# Patient Record
Sex: Male | Born: 1945 | Race: White | Hispanic: No | Marital: Married | Smoking: Former smoker
Health system: Southern US, Community
[De-identification: ages and names within clinical notes are randomized; demographics above are authoritative.]

## PROBLEM LIST (undated history)

## (undated) DIAGNOSIS — I519 Heart disease, unspecified: Secondary | ICD-10-CM

## (undated) DIAGNOSIS — I499 Cardiac arrhythmia, unspecified: Secondary | ICD-10-CM

## (undated) DIAGNOSIS — I420 Dilated cardiomyopathy: Secondary | ICD-10-CM

## (undated) DIAGNOSIS — Z7901 Long term (current) use of anticoagulants: Secondary | ICD-10-CM

## (undated) DIAGNOSIS — I359 Nonrheumatic aortic valve disorder, unspecified: Secondary | ICD-10-CM

## (undated) DIAGNOSIS — E785 Hyperlipidemia, unspecified: Secondary | ICD-10-CM

## (undated) HISTORY — DX: Nonrheumatic aortic valve disorder, unspecified: I35.9

## (undated) HISTORY — DX: Dilated cardiomyopathy: I42.0

## (undated) HISTORY — PX: AORTIC VALVE REPLACEMENT: SHX41

## (undated) HISTORY — DX: Long term (current) use of anticoagulants: Z79.01

## (undated) HISTORY — DX: Heart disease, unspecified: I51.9

## (undated) HISTORY — DX: Hyperlipidemia, unspecified: E78.5

---

## 2002-04-07 HISTORY — PX: DOPPLER ECHOCARDIOGRAPHY: SHX263

## 2002-05-07 ENCOUNTER — Encounter: Payer: Self-pay | Admitting: Emergency Medicine

## 2002-05-07 ENCOUNTER — Inpatient Hospital Stay (HOSPITAL_COMMUNITY): Admission: EM | Admit: 2002-05-07 | Discharge: 2002-05-09 | Payer: Self-pay | Admitting: Emergency Medicine

## 2002-05-08 ENCOUNTER — Encounter: Payer: Self-pay | Admitting: General Surgery

## 2002-05-09 ENCOUNTER — Encounter: Payer: Self-pay | Admitting: Neurosurgery

## 2004-11-01 ENCOUNTER — Ambulatory Visit: Payer: Self-pay | Admitting: Internal Medicine

## 2004-11-07 ENCOUNTER — Ambulatory Visit: Payer: Self-pay | Admitting: Family Medicine

## 2004-11-24 ENCOUNTER — Ambulatory Visit: Payer: Self-pay | Admitting: Internal Medicine

## 2007-06-24 ENCOUNTER — Encounter: Payer: Self-pay | Admitting: Internal Medicine

## 2007-12-23 ENCOUNTER — Encounter: Payer: Self-pay | Admitting: Internal Medicine

## 2008-06-22 ENCOUNTER — Encounter: Payer: Self-pay | Admitting: Internal Medicine

## 2008-12-28 ENCOUNTER — Encounter: Payer: Self-pay | Admitting: Internal Medicine

## 2009-07-12 ENCOUNTER — Encounter: Payer: Self-pay | Admitting: Internal Medicine

## 2010-07-11 ENCOUNTER — Ambulatory Visit: Payer: Self-pay | Admitting: Cardiology

## 2010-08-08 ENCOUNTER — Ambulatory Visit: Payer: Self-pay | Admitting: Cardiology

## 2010-09-05 ENCOUNTER — Ambulatory Visit: Payer: Self-pay | Admitting: Cardiology

## 2010-09-19 ENCOUNTER — Ambulatory Visit: Payer: Self-pay | Admitting: Cardiovascular Disease

## 2010-10-10 ENCOUNTER — Ambulatory Visit: Payer: Self-pay | Admitting: Cardiology

## 2010-10-24 ENCOUNTER — Ambulatory Visit: Payer: Self-pay | Admitting: Cardiology

## 2010-11-09 ENCOUNTER — Ambulatory Visit: Payer: Self-pay | Admitting: Cardiology

## 2010-12-25 ENCOUNTER — Ambulatory Visit (INDEPENDENT_AMBULATORY_CARE_PROVIDER_SITE_OTHER): Payer: Medicare Other | Admitting: Cardiology

## 2010-12-25 DIAGNOSIS — I359 Nonrheumatic aortic valve disorder, unspecified: Secondary | ICD-10-CM

## 2011-02-05 ENCOUNTER — Encounter: Payer: Medicare Other | Admitting: *Deleted

## 2011-02-15 ENCOUNTER — Encounter: Payer: Medicare Other | Admitting: *Deleted

## 2011-03-12 ENCOUNTER — Other Ambulatory Visit: Payer: Self-pay | Admitting: Cardiology

## 2011-03-12 NOTE — Telephone Encounter (Signed)
escribe medication per fax request  

## 2011-03-15 ENCOUNTER — Ambulatory Visit (INDEPENDENT_AMBULATORY_CARE_PROVIDER_SITE_OTHER): Payer: Medicare Other | Admitting: *Deleted

## 2011-03-15 ENCOUNTER — Telehealth: Payer: Self-pay | Admitting: *Deleted

## 2011-03-15 DIAGNOSIS — Z952 Presence of prosthetic heart valve: Secondary | ICD-10-CM | POA: Insufficient documentation

## 2011-03-15 DIAGNOSIS — Z954 Presence of other heart-valve replacement: Secondary | ICD-10-CM

## 2011-03-15 LAB — POCT INR: INR: 3.5

## 2011-03-15 NOTE — Telephone Encounter (Signed)
Coumadin results provided to pt per Dr. Elease Hashimoto; pt would not make a return appt.   Stated he would call in 8 weeks for another INR;  He refused to come back in 4 weeks

## 2011-03-30 NOTE — Consult Note (Signed)
[place]. [facility]  Patient:    Jack Joseph, Jack Joseph Visit Number: [id number] MRN: [id number]          Service Type: TRA Location: 3000 [id number] Attending Physician:  Trauma, Md Dictated by:   Hewitt Shorts, M.D. Proc. Date: 05/07/02 Admit Date:  05/07/2002 Discharge Date: 05/09/2002                            Consultation Report  HISTORY OF PRESENT ILLNESS:  The patient is a 65 year old right-handed white male who earlier today, about 8 hours ago, fell from a roof approximately 10 feet landing on a deck. He describes a brief loss of consciousness certainly lasting less than one hour. He went and saw his cardiologist, Dr. Peter Sw[place]iland, and subsequently came to the [facility] and was evaluated by the emergency room physician assistant, Barbra Sarks. A CT scan of the brain was obtained and was suspicious for a small amount of left interhemispheric traumatic subarachnoid hemorrhage. This finding was subsequently confirmed by MRI. His history is notable for an aortic valve replacement 20 years ago, and he has been on chronic Coumadin. In the emergency room, laboratories revealed a PT of 29.8 seconds and an INR of 3.6. Neurosurgery consultation was requested.  PAST MEDICAL HISTORY:  Notable for his history of aortic valve disease. He denies any atherosclerotic coronary vascular disease. He has no history of myocardial infarction, no history of hypertension, cancer, stroke, diabetes, peptic ulcer disease, or lung disease. His only previous surgery was his aortic valve replacement.  ALLERGIES:  He denies allergies to medications.  CURRENT MEDICATIONS: 1. Coumadin. 2. Citrucel.  FAMILY HISTORY:  Noncontributory.  SOCIAL HISTORY:  The patient is married. He works as a Community education officer at [employer]. He quit smoking two years ago. He quit drinking 12 years ago. He does not describe any history of substance abuse.  REVIEW  OF SYSTEMS:  Notable for those that were described in his History of Present Illness and Past Medical History but is otherwise unremarkable except for some mild headache. He does not describe any nausea, vomiting, double vision, blurred vision, weakness, or significant neck or back pain.  PHYSICAL EXAMINATION:  GENERAL:  A well-developed, well-nourished white male in no acute distress.  VITAL SIGNS:  Temperature 97.1, pulse 85, blood pressure 135/84, respiratory rate 20.  LUNGS:  Clear to auscultation. He has symmetrical respiratory excursion.  HEART:  Has a regular rate and rhythm. There is an audible valvular sound.  ABDOMEN:  Soft, nondistended, bowel sounds are present.  EXTREMITIES:  No cyanosis, clubbing, or edema.  NEUROLOGICAL:  Mental Status:  The patient awake, alert. He is oriented to his name, [facility], and June of 2003. He follows commands. His speech is full, he has good comprehension. Cranial nerves show pupils on the left at 3 mm, on the right is 3.5 mm. Both are round and react to light. Extraocular movements are intact. Facial sensation is intact. Facial moving is symmetrical. Hearing is intact bilaterally. Palatal movement is symmetrical. Shoulder shrug is symmetrical and the tongue protrudes to the midline. Motor examination reveals a 5/5 strength throughout the upper and lower extremities. He has no drift to the upper extremities. Sensation is intact to pinprick throughout. Reflex is symmetrical.  IMPRESSION: 1. Closed head injury with a loss of consciousness less than one hour with    traumatic subarachnoid hemorrhage. 2. Status post aortic  valve replacement on chronic Coumadin. 3. Anticoagulation.  RECOMMENDATIONS:  The patient is being admitted to the trauma service. He has been seen by the trauma service P.A. and Dr. Abbey Chatters. We have recommended reversal of his anticoagulation with vitamin K and fresh frozen plasma, with a follow up CT  scan of the brain without contrast in the morning and neurological checks.  I had the opportunity to speak with the patient and his wife at length regarding his conditions, our plans, and recommendations for treatment. I did counsel them that I feel he should not work at heights ever in the future since he will need to resume his Coumadin once this head injury stabilizes, and they understand the reasoning for that. Dictated by:   Hewitt Shorts, M.D. Attending Physician:  Trauma, Md DD:  05/07/02 TD:  05/09/02 Job: [id number]

## 2011-03-30 NOTE — Discharge Summary (Signed)
[place]  Patient:    Jack Joseph, Jack Joseph Visit Number: [id number] MRN: [id number]          Service Type: TRA Location: 3000 3003 01 Attending Physician:  Trauma, Md Dictated by:   Eugenia Pancoast, P.A. Admit Date:  05/07/2002 Discharge Date: 05/09/2002   CC:         Shirlean Kelly, M.D.  Cassell Clement, M.D.   Discharge Summary  DATE OF BIRTH: 07/22/1946  FINAL DIAGNOSES: 1. Fall. 2. Closed head injury. 3. Small subarachnoid hemorrhage. 4. Small subdural hemorrhage. 5. History of prosthetic valve on chronic Coumadin therapy.  HISTORY:  The patient is a 65 year old gentleman who fell from a ladder while working on his roof.  He may have lost consciousness; he does not remember. His son brought him in after he noted that he was sort of lethargic.  The patient has a prosthetic aortic valve and is on Coumadin chronically. Subsequently laboratory work was performed.  He may have lost consciousness; he does not know. His INR was 3.6.  The patient was alert and oriented at the time of arrival.  HOSPITAL COURSE: Dr. Newell Coral was consulted to see the patient because of the findings of the CT of the head.  He also ordered an MRI which showed the same injury.  He saw the patient and felt that the patient should be observed.  He recommended reversal of anticoagulation with vitamin K and this was done.  His INR came down to normal levels.  He was watched during the hospitalization. He did well in the hospital and no untoward events occurred during his stay. He was subsequently discharged to home on May 09, 2002.  He would restart his Coumadin one week after his discharge.  FOLLOW-UP:  He was scheduled to follow-up with his cardiologist, Dr. Patty Sermons at that time.  DISPOSITION: The patient was discharged home in satisfactory and stable condition on May 09, 2002. Dictated by:   Eugenia Pancoast, P.A. Attending Physician:  Trauma,  Md DD:  06/03/02 TD:  06/08/02 Job: [id number]

## 2011-04-27 ENCOUNTER — Ambulatory Visit (INDEPENDENT_AMBULATORY_CARE_PROVIDER_SITE_OTHER): Payer: Medicare Other | Admitting: *Deleted

## 2011-04-27 DIAGNOSIS — I359 Nonrheumatic aortic valve disorder, unspecified: Secondary | ICD-10-CM

## 2011-04-27 DIAGNOSIS — Z952 Presence of prosthetic heart valve: Secondary | ICD-10-CM

## 2011-04-27 LAB — PROTIME-INR: Prothrombin Time: 94.8 s (ref 10.2–12.4)

## 2011-05-01 ENCOUNTER — Ambulatory Visit (INDEPENDENT_AMBULATORY_CARE_PROVIDER_SITE_OTHER): Payer: Medicare Other | Admitting: *Deleted

## 2011-05-01 DIAGNOSIS — Z954 Presence of other heart-valve replacement: Secondary | ICD-10-CM

## 2011-05-01 DIAGNOSIS — Z952 Presence of prosthetic heart valve: Secondary | ICD-10-CM

## 2011-05-04 ENCOUNTER — Ambulatory Visit (INDEPENDENT_AMBULATORY_CARE_PROVIDER_SITE_OTHER): Payer: Medicare Other | Admitting: *Deleted

## 2011-05-04 DIAGNOSIS — Z952 Presence of prosthetic heart valve: Secondary | ICD-10-CM

## 2011-05-04 DIAGNOSIS — Z954 Presence of other heart-valve replacement: Secondary | ICD-10-CM

## 2011-05-04 LAB — POCT INR: INR: 2.2

## 2011-05-11 ENCOUNTER — Ambulatory Visit (INDEPENDENT_AMBULATORY_CARE_PROVIDER_SITE_OTHER): Payer: Medicare Other | Admitting: *Deleted

## 2011-05-11 DIAGNOSIS — I359 Nonrheumatic aortic valve disorder, unspecified: Secondary | ICD-10-CM

## 2011-05-11 DIAGNOSIS — Z952 Presence of prosthetic heart valve: Secondary | ICD-10-CM

## 2011-05-11 LAB — POCT INR: INR: 3.5

## 2011-05-25 ENCOUNTER — Encounter: Payer: Medicare Other | Admitting: *Deleted

## 2011-06-18 ENCOUNTER — Encounter: Payer: Medicare Other | Admitting: *Deleted

## 2011-06-18 ENCOUNTER — Ambulatory Visit (INDEPENDENT_AMBULATORY_CARE_PROVIDER_SITE_OTHER): Payer: Medicare Other | Admitting: *Deleted

## 2011-06-18 DIAGNOSIS — I359 Nonrheumatic aortic valve disorder, unspecified: Secondary | ICD-10-CM

## 2011-06-18 DIAGNOSIS — Z952 Presence of prosthetic heart valve: Secondary | ICD-10-CM

## 2011-06-18 LAB — POCT INR: INR: 3

## 2011-06-25 ENCOUNTER — Encounter: Payer: Self-pay | Admitting: Cardiology

## 2011-07-05 ENCOUNTER — Ambulatory Visit (INDEPENDENT_AMBULATORY_CARE_PROVIDER_SITE_OTHER): Payer: Medicare Other | Admitting: Cardiology

## 2011-07-05 ENCOUNTER — Encounter: Payer: Self-pay | Admitting: Cardiology

## 2011-07-05 DIAGNOSIS — I359 Nonrheumatic aortic valve disorder, unspecified: Secondary | ICD-10-CM

## 2011-07-05 DIAGNOSIS — Z952 Presence of prosthetic heart valve: Secondary | ICD-10-CM

## 2011-07-05 DIAGNOSIS — Z954 Presence of other heart-valve replacement: Secondary | ICD-10-CM

## 2011-07-05 DIAGNOSIS — E785 Hyperlipidemia, unspecified: Secondary | ICD-10-CM

## 2011-07-05 DIAGNOSIS — I428 Other cardiomyopathies: Secondary | ICD-10-CM

## 2011-07-05 DIAGNOSIS — I420 Dilated cardiomyopathy: Secondary | ICD-10-CM

## 2011-07-05 DIAGNOSIS — Z7901 Long term (current) use of anticoagulants: Secondary | ICD-10-CM

## 2011-07-05 NOTE — Patient Instructions (Signed)
Continue your current therapy.  Bring me a copy of your chest Xray.  I will see you again in 6 months.

## 2011-07-06 DIAGNOSIS — I359 Nonrheumatic aortic valve disorder, unspecified: Secondary | ICD-10-CM | POA: Insufficient documentation

## 2011-07-06 DIAGNOSIS — E785 Hyperlipidemia, unspecified: Secondary | ICD-10-CM | POA: Insufficient documentation

## 2011-07-06 DIAGNOSIS — I420 Dilated cardiomyopathy: Secondary | ICD-10-CM | POA: Insufficient documentation

## 2011-07-06 DIAGNOSIS — Z7901 Long term (current) use of anticoagulants: Secondary | ICD-10-CM | POA: Insufficient documentation

## 2011-07-06 NOTE — Assessment & Plan Note (Signed)
He is status post mechanical aortic valve replacement in 1983. His valve function is normal by exam. He is asymptomatic. He will continue with his anticoagulation. He needs routine SBE prophylaxis.

## 2011-07-06 NOTE — Assessment & Plan Note (Signed)
We again discussed appropriate management of dilated cardiomyopathy including ACE inhibitors and beta blocker therapy. He adamantly opposes additional medication. We discussed repeating an echocardiogram and he also refuses this. I'm concerned about the degree of ECG changes that he has and suggested a stress test and he refused this as well. He is asymptomatic at this point. I did ask that he bring me a copy of his recent chest x-ray. I will follow up again in 6 months.

## 2011-07-06 NOTE — Assessment & Plan Note (Signed)
Recent INR was 3.0. He will have a followup in 4-6 weeks.

## 2011-07-06 NOTE — Progress Notes (Signed)
   Jack Joseph Date of Birth: Oct 20, 1946   History of Present Illness: Jack Joseph is seen today for followup. States had a bad case of bronchitis in April. He went to urgent care and was treated with antibiotics. He had a chest x-ray which apparently revealed cardiomegaly. He states his bronchitis has resolved. He has no shortness of breath, orthopnea, or PND. He denies any chest pain or palpitations. He said the dizziness or syncope. He is status post mechanical aortic aortic valve replacement in 1983. He has a known dilated cardiomyopathy with ejection fraction of 30-35% by echocardiogram in 2003. He has really refused any additional medication for his cardiomyopathy.  Current Outpatient Prescriptions on File Prior to Visit  Medication Sig Dispense Refill  . warfarin (COUMADIN) 5 MG tablet TAKE DAILY AS DIRECTED  300 tablet  3    No Known Allergies  Past Medical History  Diagnosis Date  . AVD (aortic valve disease)   . Dilated cardiomyopathy     EF 30-35%  . Hyperlipidemia   . LV dysfunction   . Chronic anticoagulation     Past Surgical History  Procedure Date  . Aortic valve replacement   . Doppler echocardiography 04/07/2002    EF 30-35%    History  Smoking status  . Former Smoker  . Quit date: 11/12/2000  Smokeless tobacco  . Not on file    History  Alcohol Use No    History reviewed. No pertinent family history.  Review of Systems: As noted in history of present illness.  All other systems were reviewed and are negative.  Physical Exam: BP 102/60  Pulse 72  Ht 6' (1.829 m)  Wt 162 lb 9.6 oz (73.755 kg)  BMI 22.05 kg/m2 He is a pleasant white male in no acute distress. He is normocephalic, atraumatic. Pupils are equal round and reactive to light accommodation. Extraocular movements are full. Oropharynx is clear. Neck is supple without JVD, adenopathy, thyromegaly, or bruits. Lungs are clear. Cardiac exam reveals a good mechanical aortic valve click without  murmur or S3. Abdomen soft and nontender without masses or bruits. He has no lower extremity edema but does have large superficial varicosities in the lower extremities. Skin is warm and dry. His neurologic exam is nonfocal. LABORATORY DATA: ECG today demonstrates normal sinus rhythm with fairly marked but nonspecific ST-T wave changes. Compared to his last ECG in 2008 the ST changes are somewhat more prominent.  Assessment / Plan:

## 2011-07-09 ENCOUNTER — Encounter: Payer: Self-pay | Admitting: Cardiology

## 2011-07-30 ENCOUNTER — Ambulatory Visit (INDEPENDENT_AMBULATORY_CARE_PROVIDER_SITE_OTHER): Payer: Medicare Other | Admitting: *Deleted

## 2011-07-30 DIAGNOSIS — I359 Nonrheumatic aortic valve disorder, unspecified: Secondary | ICD-10-CM

## 2011-07-30 DIAGNOSIS — Z952 Presence of prosthetic heart valve: Secondary | ICD-10-CM

## 2011-09-11 ENCOUNTER — Ambulatory Visit (INDEPENDENT_AMBULATORY_CARE_PROVIDER_SITE_OTHER): Payer: Medicare Other | Admitting: *Deleted

## 2011-09-11 DIAGNOSIS — Z952 Presence of prosthetic heart valve: Secondary | ICD-10-CM

## 2011-09-11 DIAGNOSIS — I359 Nonrheumatic aortic valve disorder, unspecified: Secondary | ICD-10-CM

## 2011-09-11 DIAGNOSIS — Z954 Presence of other heart-valve replacement: Secondary | ICD-10-CM

## 2011-09-11 LAB — POCT INR: INR: 2.7

## 2011-10-31 ENCOUNTER — Ambulatory Visit (INDEPENDENT_AMBULATORY_CARE_PROVIDER_SITE_OTHER): Payer: Medicare Other | Admitting: *Deleted

## 2011-10-31 DIAGNOSIS — Z952 Presence of prosthetic heart valve: Secondary | ICD-10-CM

## 2011-10-31 DIAGNOSIS — I359 Nonrheumatic aortic valve disorder, unspecified: Secondary | ICD-10-CM

## 2011-10-31 LAB — POCT INR: INR: 2.5

## 2011-12-12 ENCOUNTER — Encounter: Payer: Medicare Other | Admitting: *Deleted

## 2011-12-12 ENCOUNTER — Ambulatory Visit (INDEPENDENT_AMBULATORY_CARE_PROVIDER_SITE_OTHER): Payer: Medicare Other | Admitting: *Deleted

## 2011-12-12 DIAGNOSIS — Z954 Presence of other heart-valve replacement: Secondary | ICD-10-CM

## 2011-12-12 DIAGNOSIS — Z952 Presence of prosthetic heart valve: Secondary | ICD-10-CM

## 2012-01-15 ENCOUNTER — Ambulatory Visit (INDEPENDENT_AMBULATORY_CARE_PROVIDER_SITE_OTHER): Payer: Medicare Other | Admitting: *Deleted

## 2012-01-15 DIAGNOSIS — Z954 Presence of other heart-valve replacement: Secondary | ICD-10-CM

## 2012-01-15 DIAGNOSIS — I359 Nonrheumatic aortic valve disorder, unspecified: Secondary | ICD-10-CM

## 2012-01-15 DIAGNOSIS — Z952 Presence of prosthetic heart valve: Secondary | ICD-10-CM

## 2012-02-26 ENCOUNTER — Ambulatory Visit (INDEPENDENT_AMBULATORY_CARE_PROVIDER_SITE_OTHER): Payer: Medicare Other | Admitting: Pharmacist

## 2012-02-26 ENCOUNTER — Encounter: Payer: Self-pay | Admitting: Cardiology

## 2012-02-26 ENCOUNTER — Ambulatory Visit (INDEPENDENT_AMBULATORY_CARE_PROVIDER_SITE_OTHER): Payer: Medicare Other | Admitting: Cardiology

## 2012-02-26 VITALS — BP 100/64 | HR 82 | Ht 73.0 in | Wt 162.0 lb

## 2012-02-26 DIAGNOSIS — I519 Heart disease, unspecified: Secondary | ICD-10-CM

## 2012-02-26 DIAGNOSIS — Z954 Presence of other heart-valve replacement: Secondary | ICD-10-CM

## 2012-02-26 DIAGNOSIS — I359 Nonrheumatic aortic valve disorder, unspecified: Secondary | ICD-10-CM

## 2012-02-26 DIAGNOSIS — Z952 Presence of prosthetic heart valve: Secondary | ICD-10-CM

## 2012-02-26 DIAGNOSIS — Z7901 Long term (current) use of anticoagulants: Secondary | ICD-10-CM

## 2012-02-26 DIAGNOSIS — I428 Other cardiomyopathies: Secondary | ICD-10-CM

## 2012-02-26 DIAGNOSIS — I420 Dilated cardiomyopathy: Secondary | ICD-10-CM

## 2012-02-26 LAB — POCT INR: INR: 1.8

## 2012-02-26 MED ORDER — WARFARIN SODIUM 5 MG PO TABS: 5.0000 mg | ORAL_TABLET | Freq: Every day | ORAL | Status: DC

## 2012-02-26 NOTE — Assessment & Plan Note (Signed)
He is status post aortic valve replacement with a Bjork Shiley mechanical valve in the 1990s while living in [place]. His valve function is normal by exam. He is on chronic anticoagulation with Coumadin. Continue routine SBE prophylaxis.

## 2012-02-26 NOTE — Assessment & Plan Note (Signed)
He has chronic left ventricular dysfunction moderate to severe degree. He is asymptomatic. We have discussed optimal medical therapy including ACE inhibitors and beta blockers in the past but he has refused any additional medication.

## 2012-02-26 NOTE — Patient Instructions (Signed)
Continue your current therapy  I will see you again in 6 months.   

## 2012-02-26 NOTE — Progress Notes (Signed)
   [name] Date of Birth: 1945/11/17   History of Present Illness: Jack Joseph is seen today for followup. He reports that he is doing very well. 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 He denies any shortness of breath or chest pain. He's had no orthopnea, edema, or PND. He has no dizziness. He relates that Tylenol makes his INR go up.  Current Outpatient Prescriptions on File Prior to Visit  Medication Sig Dispense Refill  . DISCONTD: warfarin (COUMADIN) 5 MG tablet TAKE DAILY AS DIRECTED  300 tablet  3    No Known Allergies  Past Medical History  Diagnosis Date  . AVD (aortic valve disease)   . Dilated cardiomyopathy     EF 30-35%  . Hyperlipidemia   . LV dysfunction   . Chronic anticoagulation     Past Surgical History  Procedure Date  . Aortic valve replacement   . Doppler echocardiography 04/07/2002    EF 30-35%    History  Smoking status  . Former Smoker  . Quit date: 11/12/2000  Smokeless tobacco  . Not on file    History  Alcohol Use No    History reviewed. No pertinent family history.  Review of Systems: As noted in history of present illness.  All other systems were reviewed and are negative.  Physical Exam: BP 100/64  Pulse 82  Ht 6\' 1"  (1.854 m)  Wt 162 lb (73.483 kg)  BMI 21.37 kg/m2 He is a pleasant white male in no acute distress. HEENT exam is unremarkable. Neck is supple without JVD, adenopathy, thyromegaly, or bruits. Lungs are clear. Cardiac exam reveals a good mechanical aortic valve click without murmur or S3. Abdomen soft and nontender without masses or bruits. He has no lower extremity edema but does have large superficial varicosities in the lower extremities. Skin is warm and dry. His neurologic exam is nonfocal. LABORATORY DATA: INR is 2.7.  Assessment / Plan:

## 2012-04-08 ENCOUNTER — Ambulatory Visit (INDEPENDENT_AMBULATORY_CARE_PROVIDER_SITE_OTHER): Payer: Medicare Other | Admitting: Pharmacist

## 2012-04-08 DIAGNOSIS — Z7901 Long term (current) use of anticoagulants: Secondary | ICD-10-CM

## 2012-04-08 DIAGNOSIS — Z954 Presence of other heart-valve replacement: Secondary | ICD-10-CM

## 2012-04-08 DIAGNOSIS — Z952 Presence of prosthetic heart valve: Secondary | ICD-10-CM

## 2012-04-08 DIAGNOSIS — I359 Nonrheumatic aortic valve disorder, unspecified: Secondary | ICD-10-CM

## 2012-05-20 ENCOUNTER — Ambulatory Visit (INDEPENDENT_AMBULATORY_CARE_PROVIDER_SITE_OTHER): Payer: Medicare Other | Admitting: Pharmacist

## 2012-05-20 DIAGNOSIS — Z952 Presence of prosthetic heart valve: Secondary | ICD-10-CM

## 2012-05-20 DIAGNOSIS — Z954 Presence of other heart-valve replacement: Secondary | ICD-10-CM

## 2012-05-20 DIAGNOSIS — Z7901 Long term (current) use of anticoagulants: Secondary | ICD-10-CM

## 2012-05-20 LAB — POCT INR: INR: 2.8

## 2012-07-01 ENCOUNTER — Ambulatory Visit (INDEPENDENT_AMBULATORY_CARE_PROVIDER_SITE_OTHER): Payer: Medicare Other | Admitting: *Deleted

## 2012-07-01 DIAGNOSIS — Z952 Presence of prosthetic heart valve: Secondary | ICD-10-CM

## 2012-07-01 DIAGNOSIS — I359 Nonrheumatic aortic valve disorder, unspecified: Secondary | ICD-10-CM

## 2012-07-01 DIAGNOSIS — Z7901 Long term (current) use of anticoagulants: Secondary | ICD-10-CM

## 2012-08-12 ENCOUNTER — Ambulatory Visit (INDEPENDENT_AMBULATORY_CARE_PROVIDER_SITE_OTHER): Payer: Medicare Other

## 2012-08-12 DIAGNOSIS — Z954 Presence of other heart-valve replacement: Secondary | ICD-10-CM

## 2012-08-12 DIAGNOSIS — Z952 Presence of prosthetic heart valve: Secondary | ICD-10-CM

## 2012-08-12 DIAGNOSIS — Z7901 Long term (current) use of anticoagulants: Secondary | ICD-10-CM

## 2012-08-12 LAB — POCT INR: INR: 3.3

## 2012-09-02 ENCOUNTER — Ambulatory Visit: Payer: Medicare Other | Admitting: Cardiology

## 2012-09-24 ENCOUNTER — Ambulatory Visit (INDEPENDENT_AMBULATORY_CARE_PROVIDER_SITE_OTHER): Payer: Medicare Other | Admitting: *Deleted

## 2012-09-24 DIAGNOSIS — Z954 Presence of other heart-valve replacement: Secondary | ICD-10-CM

## 2012-09-24 DIAGNOSIS — Z952 Presence of prosthetic heart valve: Secondary | ICD-10-CM

## 2012-09-24 DIAGNOSIS — Z7901 Long term (current) use of anticoagulants: Secondary | ICD-10-CM

## 2012-09-24 LAB — POCT INR: INR: 3

## 2012-10-28 ENCOUNTER — Ambulatory Visit (INDEPENDENT_AMBULATORY_CARE_PROVIDER_SITE_OTHER): Payer: Medicare Other | Admitting: Cardiology

## 2012-10-28 ENCOUNTER — Encounter: Payer: Self-pay | Admitting: Cardiology

## 2012-10-28 VITALS — BP 108/61 | HR 76 | Ht 71.0 in | Wt 159.0 lb

## 2012-10-28 DIAGNOSIS — I420 Dilated cardiomyopathy: Secondary | ICD-10-CM

## 2012-10-28 DIAGNOSIS — Z954 Presence of other heart-valve replacement: Secondary | ICD-10-CM

## 2012-10-28 DIAGNOSIS — Z7901 Long term (current) use of anticoagulants: Secondary | ICD-10-CM

## 2012-10-28 DIAGNOSIS — I428 Other cardiomyopathies: Secondary | ICD-10-CM

## 2012-10-28 DIAGNOSIS — I359 Nonrheumatic aortic valve disorder, unspecified: Secondary | ICD-10-CM

## 2012-10-28 DIAGNOSIS — Z952 Presence of prosthetic heart valve: Secondary | ICD-10-CM

## 2012-10-28 NOTE — Patient Instructions (Signed)
Continue your current therapy  I will see you in 6 months.   

## 2012-10-28 NOTE — Progress Notes (Signed)
   Jack Joseph Date of Birth: February 27, 1946   History of Present Illness: Jack Joseph is seen today for followup. He states he is doing very well. He has had no significant symptoms of palpitations, chest pain, or shortness of breath. He has kept his regular followup with the Coumadin clinic. His INRs have been therapeutic.  Current Outpatient Prescriptions on File Prior to Visit  Medication Sig Dispense Refill  . warfarin (COUMADIN) 5 MG tablet Take 1 tablet (5 mg total) by mouth daily. As directed by coumadin clinic  100 tablet  3    No Known Allergies  Past Medical History  Diagnosis Date  . AVD (aortic valve disease)   . Dilated cardiomyopathy     EF 30-35%  . Hyperlipidemia   . LV dysfunction   . Chronic anticoagulation     Past Surgical History  Procedure Date  . Aortic valve replacement   . Doppler echocardiography 04/07/2002    EF 30-35%    History  Smoking status  . Former Smoker  . Quit date: 11/12/2000  Smokeless tobacco  . Not on file    History  Alcohol Use No    History reviewed. No pertinent family history.  Review of Systems: As noted in history of present illness.  All other systems were reviewed and are negative.  Physical Exam: BP 108/61  Pulse 76  Ht 5\' 11"  (1.803 m)  Wt 72.122 kg (159 lb)  BMI 22.18 kg/m2 He is a pleasant white male in no acute distress. HEENT exam is unremarkable. Neck is supple without JVD, adenopathy, thyromegaly, or bruits. Lungs are clear. Cardiac exam reveals a good mechanical aortic valve click without murmur or S3. Abdomen soft and nontender without masses or bruits. He has no lower extremity edema but does have large superficial varicosities in the lower extremities. Skin is warm and dry. His neurologic exam is nonfocal. LABORATORY DATA: INR is 3.0  Assessment / Plan: 1. 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 He is on chronic Coumadin therapy. He is therapeutic. Continue  current therapy.  2. Dilated cardiomyopathy. Ejection fraction of 30-35%. Patient is asymptomatic. We have thoroughly discussed additional medical therapy in the past including beta blockers and ACE inhibitors but the patient does not want to take additional medication despite this recommendation.

## 2012-11-06 ENCOUNTER — Ambulatory Visit (INDEPENDENT_AMBULATORY_CARE_PROVIDER_SITE_OTHER): Payer: Medicare Other | Admitting: *Deleted

## 2012-11-06 DIAGNOSIS — Z7901 Long term (current) use of anticoagulants: Secondary | ICD-10-CM

## 2012-11-06 DIAGNOSIS — Z954 Presence of other heart-valve replacement: Secondary | ICD-10-CM

## 2012-11-06 DIAGNOSIS — Z952 Presence of prosthetic heart valve: Secondary | ICD-10-CM

## 2012-11-06 LAB — POCT INR: INR: 2.7

## 2012-12-18 ENCOUNTER — Ambulatory Visit (INDEPENDENT_AMBULATORY_CARE_PROVIDER_SITE_OTHER): Payer: Medicare Other | Admitting: *Deleted

## 2012-12-18 DIAGNOSIS — Z952 Presence of prosthetic heart valve: Secondary | ICD-10-CM

## 2012-12-18 DIAGNOSIS — Z954 Presence of other heart-valve replacement: Secondary | ICD-10-CM

## 2012-12-18 DIAGNOSIS — Z7901 Long term (current) use of anticoagulants: Secondary | ICD-10-CM

## 2013-01-29 ENCOUNTER — Ambulatory Visit (INDEPENDENT_AMBULATORY_CARE_PROVIDER_SITE_OTHER): Payer: Medicare Other

## 2013-01-29 DIAGNOSIS — Z954 Presence of other heart-valve replacement: Secondary | ICD-10-CM

## 2013-01-29 LAB — POCT INR: INR: 3.7

## 2013-03-06 ENCOUNTER — Other Ambulatory Visit: Payer: Self-pay | Admitting: *Deleted

## 2013-03-06 MED ORDER — WARFARIN SODIUM 5 MG PO TABS: ORAL_TABLET | ORAL | Status: DC

## 2013-03-12 ENCOUNTER — Ambulatory Visit (INDEPENDENT_AMBULATORY_CARE_PROVIDER_SITE_OTHER): Payer: Medicare Other

## 2013-03-12 DIAGNOSIS — Z954 Presence of other heart-valve replacement: Secondary | ICD-10-CM

## 2013-03-12 DIAGNOSIS — Z7901 Long term (current) use of anticoagulants: Secondary | ICD-10-CM

## 2013-03-12 DIAGNOSIS — Z952 Presence of prosthetic heart valve: Secondary | ICD-10-CM

## 2013-04-23 ENCOUNTER — Ambulatory Visit (INDEPENDENT_AMBULATORY_CARE_PROVIDER_SITE_OTHER): Payer: Medicare Other

## 2013-04-23 DIAGNOSIS — Z954 Presence of other heart-valve replacement: Secondary | ICD-10-CM

## 2013-04-23 DIAGNOSIS — Z952 Presence of prosthetic heart valve: Secondary | ICD-10-CM

## 2013-04-23 DIAGNOSIS — Z7901 Long term (current) use of anticoagulants: Secondary | ICD-10-CM

## 2013-05-14 ENCOUNTER — Ambulatory Visit (INDEPENDENT_AMBULATORY_CARE_PROVIDER_SITE_OTHER): Payer: Medicare Other | Admitting: Family Medicine

## 2013-05-14 VITALS — BP 118/78 | HR 92 | Temp 98.8°F | Resp 18 | Ht 69.5 in | Wt 153.6 lb

## 2013-05-14 DIAGNOSIS — J209 Acute bronchitis, unspecified: Secondary | ICD-10-CM

## 2013-05-14 MED ORDER — CEFDINIR 300 MG PO CAPS: 300.0000 mg | ORAL_CAPSULE | Freq: Two times a day (BID) | ORAL | Status: DC

## 2013-05-14 NOTE — Progress Notes (Signed)
[facility] [address] [place] [address] [phone]  Date:  05/14/2013   Name:  Jack Joseph   DOB:  1946-03-03   MRN:  [id number]  PCP:  No primary provider on file.    Chief Complaint: Cough   History of Present Illness:  [name] is a 67 y.o. very pleasant male patient who presents with the following:  Here today with illness.  He thinks he may have bronchitis.  He has felt "a little tightnes on the chest," cough which waxes and wanes for the last several days. He has coughed up some material.  He has not noted a fever, aches or chills. Otherwise he feels well.   He did cough a lot last night so he decided to come in to be seen today.  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   No runny nose, no earache   He does not note SOB He had an aortic valve replacement (mechanical) about 30 years ago. He uses coumadin chronically.  Otherwise he is generally healthy.   Patient Active Problem List   Diagnosis Date Noted  . AVD (aortic valve disease)   . Dilated cardiomyopathy   . Hyperlipidemia   . Chronic anticoagulation   . Hx of replacement of aortic valve 03/15/2011    Past Medical History  Diagnosis Date  . AVD (aortic valve disease)   . Dilated cardiomyopathy     EF 30-35%  . Hyperlipidemia   . LV dysfunction   . Chronic anticoagulation     Past Surgical History  Procedure Laterality Date  . Aortic valve replacement    . Doppler echocardiography  04/07/2002    EF 30-35%    History  Substance Use Topics  . Smoking status: Former Smoker    Quit date: 11/12/2000  . Smokeless tobacco: Not on file  . Alcohol Use: No    History reviewed. No pertinent family history.  No Known Allergies  Medication list has been reviewed and updated.  No current outpatient prescriptions on file prior to visit.   No current facility-administered medications on file prior to visit.    Review of Systems:  As per HPI-  otherwise negative.   Physical Examination: Filed Vitals:   05/14/13 1624  BP: 118/78  Pulse: 92  Temp: 98.8 F (37.1 C)  Resp: 18   Filed Vitals:   05/14/13 1624  Height: 5' 9.5" (1.765 m)  Weight: 153 lb 9.6 oz (69.673 kg)   Body mass index is 22.37 kg/(m^2). Ideal Body Weight: Weight in (lb) to have BMI = 25: 171.4  GEN: WDWN, NAD, Non-toxic, A & O x 3 HEENT: Atraumatic, Normocephalic. Neck supple. No masses, No LAD.  Bilateral TM wnl, oropharynx normal.  PEERL,EOMI.  Nasal congestion  Ears and Nose: No external deformity. CV: RRR, No M/G/R. No JVD. No thrill. No extra heart sounds.  He does have a mechanical valve click PULM: CTA B, no wheezes, crackles, rhonchi. No retractions. No resp. distress. No accessory muscle use. ABD: S, NT, ND, +BS. No rebound. No HSM. EXTR: No c/c/e NEURO Normal gait.  PSYCH: Normally interactive. Conversant. Not depressed or anxious appearing.  Calm demeanor.    Assessment and Plan: Acute bronchitis - Plan: cefdinir (OMNICEF) 300 MG capsule  Treat for bronchitis as above with omnicef. He states he used an inhaler in the past for a similar problem, but cannot remember what type of medication this was.  He will  look when he gets home (he still has the inhaler) and will let me know if he needs more of this.     Signed Abbe Amsterdam, MD

## 2013-05-14 NOTE — Patient Instructions (Addendum)
Let me know if you are not feeling better in the next few days- Sooner if worse.   If you feel that you need your inhaler like before, please give me a call or email with the name of the medication.

## 2013-06-04 ENCOUNTER — Ambulatory Visit (INDEPENDENT_AMBULATORY_CARE_PROVIDER_SITE_OTHER): Payer: Medicare Other

## 2013-06-04 DIAGNOSIS — Z954 Presence of other heart-valve replacement: Secondary | ICD-10-CM

## 2013-06-04 DIAGNOSIS — Z7901 Long term (current) use of anticoagulants: Secondary | ICD-10-CM

## 2013-06-04 DIAGNOSIS — Z952 Presence of prosthetic heart valve: Secondary | ICD-10-CM

## 2013-06-08 ENCOUNTER — Encounter: Payer: Self-pay | Admitting: Cardiology

## 2013-06-08 ENCOUNTER — Ambulatory Visit (INDEPENDENT_AMBULATORY_CARE_PROVIDER_SITE_OTHER): Payer: Medicare Other | Admitting: Cardiology

## 2013-06-08 VITALS — BP 104/62 | HR 82 | Ht 69.0 in | Wt 151.0 lb

## 2013-06-08 DIAGNOSIS — Z954 Presence of other heart-valve replacement: Secondary | ICD-10-CM

## 2013-06-08 DIAGNOSIS — I428 Other cardiomyopathies: Secondary | ICD-10-CM

## 2013-06-08 DIAGNOSIS — I420 Dilated cardiomyopathy: Secondary | ICD-10-CM

## 2013-06-08 DIAGNOSIS — Z952 Presence of prosthetic heart valve: Secondary | ICD-10-CM

## 2013-06-08 DIAGNOSIS — Z7901 Long term (current) use of anticoagulants: Secondary | ICD-10-CM

## 2013-06-08 DIAGNOSIS — I359 Nonrheumatic aortic valve disorder, unspecified: Secondary | ICD-10-CM

## 2013-06-08 MED ORDER — SILDENAFIL CITRATE 50 MG PO TABS: 50.0000 mg | ORAL_TABLET | Freq: Every day | ORAL | Status: DC | PRN

## 2013-06-08 NOTE — Patient Instructions (Addendum)
Continue your medication  I will see you in 6 months.

## 2013-06-08 NOTE — Progress Notes (Signed)
   Jack Joseph Date of Birth: 1946-07-30   History of Present Illness: Jack Joseph is seen today for followup. 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 He is on chronic Coumadin therapy. He has a history of dilated cardiomyopathy with ejection fraction of 30-35%. He states he is doing very well. He has had no significant symptoms of palpitations, chest pain, or shortness of breath. He was treated for a case of bronchitis about a month ago. With antibiotic therapy his INR increased to 4.2.  Current Outpatient Prescriptions on File Prior to Visit  Medication Sig Dispense Refill  . warfarin (COUMADIN) 5 MG tablet As directed by coumadin clinic. 5 mg 5 days a week and 2.5 mg 2 days a week. Alternates with 5 mg 4 days a week and 2.5 3 days a week.       No current facility-administered medications on file prior to visit.    No Known Allergies  Past Medical History  Diagnosis Date  . AVD (aortic valve disease)   . Dilated cardiomyopathy     EF 30-35%  . Hyperlipidemia   . LV dysfunction   . Chronic anticoagulation     Past Surgical History  Procedure Laterality Date  . Aortic valve replacement    . Doppler echocardiography  04/07/2002    EF 30-35%    History  Smoking status  . Former Smoker  . Quit date: 11/12/2000  Smokeless tobacco  . Not on file    History  Alcohol Use No    History reviewed. No pertinent family history.  Review of Systems: As noted in history of present illness.  All other systems were reviewed and are negative.  Physical Exam: BP 104/62  Pulse 82  Ht 5\' 9"  (1.753 m)  Wt 151 lb 0.4 oz (68.504 kg)  BMI 22.29 kg/m2 He is a pleasant white male in no acute distress. HEENT exam is unremarkable. Neck is supple without JVD, adenopathy, thyromegaly, or bruits. Lungs are clear. Cardiac exam reveals a good mechanical aortic valve click without murmur or S3. Abdomen soft and nontender without masses or bruits. He has no lower  extremity edema but does have large superficial varicosities in the lower extremities. Skin is warm and dry. His neurologic exam is nonfocal. LABORATORY DATA: INR is 4.2  Assessment / Plan: 1. 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 He is on chronic Coumadin therapy.  Continue current therapy.  2. Dilated cardiomyopathy. Ejection fraction of 30-35%. Patient is asymptomatic. We have thoroughly discussed additional medical therapy in the past including beta blockers and ACE inhibitors but the patient does not want to take additional medication despite this recommendation.

## 2013-06-10 ENCOUNTER — Telehealth: Payer: Self-pay

## 2013-06-10 NOTE — Telephone Encounter (Signed)
Returned call to patient generic viagra was sent to pharmacy advised to call back if needed.

## 2013-06-10 NOTE — Telephone Encounter (Signed)
Wants to get his Viagra prescription switched to Sildenafil due to cost

## 2013-07-16 ENCOUNTER — Ambulatory Visit (INDEPENDENT_AMBULATORY_CARE_PROVIDER_SITE_OTHER): Payer: Medicare Other | Admitting: *Deleted

## 2013-07-16 DIAGNOSIS — Z954 Presence of other heart-valve replacement: Secondary | ICD-10-CM

## 2013-07-16 DIAGNOSIS — Z7901 Long term (current) use of anticoagulants: Secondary | ICD-10-CM

## 2013-07-16 DIAGNOSIS — Z952 Presence of prosthetic heart valve: Secondary | ICD-10-CM

## 2013-08-28 ENCOUNTER — Ambulatory Visit (INDEPENDENT_AMBULATORY_CARE_PROVIDER_SITE_OTHER): Payer: Medicare Other | Admitting: *Deleted

## 2013-08-28 DIAGNOSIS — Z7901 Long term (current) use of anticoagulants: Secondary | ICD-10-CM

## 2013-08-28 DIAGNOSIS — Z954 Presence of other heart-valve replacement: Secondary | ICD-10-CM

## 2013-08-28 DIAGNOSIS — Z952 Presence of prosthetic heart valve: Secondary | ICD-10-CM

## 2013-09-09 ENCOUNTER — Encounter: Payer: Self-pay | Admitting: Cardiology

## 2013-10-05 ENCOUNTER — Ambulatory Visit (INDEPENDENT_AMBULATORY_CARE_PROVIDER_SITE_OTHER): Payer: Medicare Other | Admitting: General Practice

## 2013-10-05 DIAGNOSIS — Z954 Presence of other heart-valve replacement: Secondary | ICD-10-CM

## 2013-10-05 DIAGNOSIS — Z952 Presence of prosthetic heart valve: Secondary | ICD-10-CM

## 2013-10-05 DIAGNOSIS — Z7901 Long term (current) use of anticoagulants: Secondary | ICD-10-CM

## 2013-11-09 ENCOUNTER — Other Ambulatory Visit: Payer: Self-pay | Admitting: *Deleted

## 2013-11-09 MED ORDER — WARFARIN SODIUM 5 MG PO TABS: 5.0000 mg | ORAL_TABLET | ORAL | Status: DC

## 2013-11-16 ENCOUNTER — Ambulatory Visit (INDEPENDENT_AMBULATORY_CARE_PROVIDER_SITE_OTHER): Payer: Medicare Other

## 2013-11-16 DIAGNOSIS — Z952 Presence of prosthetic heart valve: Secondary | ICD-10-CM

## 2013-11-16 DIAGNOSIS — Z5181 Encounter for therapeutic drug level monitoring: Secondary | ICD-10-CM

## 2013-11-16 DIAGNOSIS — Z954 Presence of other heart-valve replacement: Secondary | ICD-10-CM

## 2013-11-16 DIAGNOSIS — Z7901 Long term (current) use of anticoagulants: Secondary | ICD-10-CM

## 2013-11-16 LAB — POCT INR: INR: 2.5

## 2013-11-26 ENCOUNTER — Encounter: Payer: Self-pay | Admitting: Cardiology

## 2013-12-11 ENCOUNTER — Ambulatory Visit: Payer: Medicare Other | Admitting: Cardiology

## 2014-01-01 ENCOUNTER — Ambulatory Visit (INDEPENDENT_AMBULATORY_CARE_PROVIDER_SITE_OTHER): Payer: Medicare Other | Admitting: *Deleted

## 2014-01-01 DIAGNOSIS — Z7901 Long term (current) use of anticoagulants: Secondary | ICD-10-CM

## 2014-01-01 DIAGNOSIS — Z954 Presence of other heart-valve replacement: Secondary | ICD-10-CM

## 2014-01-01 DIAGNOSIS — Z952 Presence of prosthetic heart valve: Secondary | ICD-10-CM

## 2014-01-01 LAB — POCT INR: INR: 2.3

## 2014-01-29 ENCOUNTER — Ambulatory Visit (INDEPENDENT_AMBULATORY_CARE_PROVIDER_SITE_OTHER): Payer: Medicare Other | Admitting: Cardiology

## 2014-01-29 ENCOUNTER — Encounter: Payer: Self-pay | Admitting: Cardiology

## 2014-01-29 VITALS — BP 125/75 | HR 69 | Ht 69.0 in | Wt 157.0 lb

## 2014-01-29 DIAGNOSIS — Z952 Presence of prosthetic heart valve: Secondary | ICD-10-CM

## 2014-01-29 DIAGNOSIS — I428 Other cardiomyopathies: Secondary | ICD-10-CM

## 2014-01-29 DIAGNOSIS — Z954 Presence of other heart-valve replacement: Secondary | ICD-10-CM

## 2014-01-29 DIAGNOSIS — I420 Dilated cardiomyopathy: Secondary | ICD-10-CM

## 2014-01-29 DIAGNOSIS — Z7901 Long term (current) use of anticoagulants: Secondary | ICD-10-CM

## 2014-01-29 DIAGNOSIS — E785 Hyperlipidemia, unspecified: Secondary | ICD-10-CM

## 2014-01-29 MED ORDER — SILDENAFIL CITRATE 20 MG PO TABS: 20.0000 mg | ORAL_TABLET | Freq: Three times a day (TID) | ORAL | Status: DC

## 2014-01-29 MED ORDER — SILDENAFIL CITRATE 20 MG PO TABS: ORAL_TABLET | ORAL | Status: DC

## 2014-01-29 NOTE — Patient Instructions (Signed)
Continue your current therapy  I will see you in 6 months.   

## 2014-01-30 NOTE — Progress Notes (Signed)
   Jack Joseph Date of Birth: October 10, 1946   History of Present Illness: Jack Joseph is seen today for followup. 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 He is on chronic Coumadin therapy. He has a history of dilated cardiomyopathy with ejection fraction of 30-35%. He states he is doing very well. He has had no significant symptoms of palpitations, chest pain, or shortness of breath. He is working 5 days a week for an [employer].  Current Outpatient Prescriptions on File Prior to Visit  Medication Sig Dispense Refill  . warfarin (COUMADIN) 5 MG tablet Take 1 tablet (5 mg total) by mouth as directed.  100 tablet  1   No current facility-administered medications on file prior to visit.    No Known Allergies  Past Medical History  Diagnosis Date  . AVD (aortic valve disease)   . Dilated cardiomyopathy     EF 30-35%  . Hyperlipidemia   . LV dysfunction   . Chronic anticoagulation     Past Surgical History  Procedure Laterality Date  . Aortic valve replacement    . Doppler echocardiography  04/07/2002    EF 30-35%    History  Smoking status  . Former Smoker  . Quit date: 11/12/2000  Smokeless tobacco  . Not on file    History  Alcohol Use No    History reviewed. No pertinent family history.  Review of Systems: As noted in history of present illness.  All other systems were reviewed and are negative.  Physical Exam: BP 125/75  Pulse 69  Ht 5\' 9"  (1.753 m)  Wt 157 lb (71.215 kg)  BMI 23.17 kg/m2 He is a pleasant white male in no acute distress. HEENT exam is unremarkable. Neck is supple without JVD, adenopathy, thyromegaly, or bruits. Lungs are clear. Cardiac exam reveals a good mechanical aortic valve click without murmur or S3. Abdomen soft and nontender without masses or bruits. He has no lower extremity edema but does have large superficial varicosities in the lower extremities. Skin is warm and dry. His neurologic exam is  nonfocal.  LABORATORY DATA: Last INR 01/01/14 was 2.3  Ecg: NSR rate 69 bpm, Nonspecific ST-T wave abnormality. No change from 10/28/12.  Assessment / Plan: 1. 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 He is on chronic Coumadin therapy.  Continue current therapy.  2. Dilated cardiomyopathy. Ejection fraction of 30-35%. Patient is asymptomatic. We have thoroughly discussed additional medical therapy in the past including beta blockers and ACE inhibitors but the patient does not want to take additional medication at this time.

## 2014-02-02 ENCOUNTER — Other Ambulatory Visit: Payer: Self-pay

## 2014-02-02 DIAGNOSIS — E785 Hyperlipidemia, unspecified: Secondary | ICD-10-CM

## 2014-02-02 DIAGNOSIS — I359 Nonrheumatic aortic valve disorder, unspecified: Secondary | ICD-10-CM

## 2014-02-02 DIAGNOSIS — I420 Dilated cardiomyopathy: Secondary | ICD-10-CM

## 2014-02-12 ENCOUNTER — Ambulatory Visit (INDEPENDENT_AMBULATORY_CARE_PROVIDER_SITE_OTHER): Payer: Medicare Other | Admitting: *Deleted

## 2014-02-12 DIAGNOSIS — Z954 Presence of other heart-valve replacement: Secondary | ICD-10-CM

## 2014-02-12 DIAGNOSIS — Z952 Presence of prosthetic heart valve: Secondary | ICD-10-CM

## 2014-02-12 DIAGNOSIS — Z7901 Long term (current) use of anticoagulants: Secondary | ICD-10-CM

## 2014-02-12 LAB — POCT INR: INR: 2.9

## 2014-03-25 ENCOUNTER — Ambulatory Visit (INDEPENDENT_AMBULATORY_CARE_PROVIDER_SITE_OTHER): Payer: Medicare Other

## 2014-03-25 DIAGNOSIS — Z7901 Long term (current) use of anticoagulants: Secondary | ICD-10-CM

## 2014-03-25 DIAGNOSIS — Z952 Presence of prosthetic heart valve: Secondary | ICD-10-CM

## 2014-03-25 DIAGNOSIS — Z954 Presence of other heart-valve replacement: Secondary | ICD-10-CM

## 2014-03-25 LAB — POCT INR: INR: 3

## 2014-05-06 ENCOUNTER — Ambulatory Visit (INDEPENDENT_AMBULATORY_CARE_PROVIDER_SITE_OTHER): Payer: Medicare Other | Admitting: *Deleted

## 2014-05-06 DIAGNOSIS — Z954 Presence of other heart-valve replacement: Secondary | ICD-10-CM

## 2014-05-06 DIAGNOSIS — Z952 Presence of prosthetic heart valve: Secondary | ICD-10-CM

## 2014-05-06 DIAGNOSIS — Z7901 Long term (current) use of anticoagulants: Secondary | ICD-10-CM

## 2014-05-06 LAB — POCT INR: INR: 5.1

## 2014-05-25 ENCOUNTER — Telehealth: Payer: Self-pay | Admitting: Cardiology

## 2014-05-25 NOTE — Telephone Encounter (Signed)
Spoke to patient he stated he has appointment with Dr.Jordan 08/10/14 and he will not eat.Stated he will have lab work done that day.

## 2014-05-25 NOTE — Telephone Encounter (Signed)
Message forwarded to Anabel Halon

## 2014-05-25 NOTE — Telephone Encounter (Signed)
Patient is needing a lab order sent out to him for fasting labs before his appt on 08/10/14    Thanks

## 2014-06-22 ENCOUNTER — Ambulatory Visit (INDEPENDENT_AMBULATORY_CARE_PROVIDER_SITE_OTHER): Payer: Medicare Other | Admitting: Pharmacist Clinician (PhC)/ Clinical Pharmacy Specialist

## 2014-06-22 DIAGNOSIS — Z7901 Long term (current) use of anticoagulants: Secondary | ICD-10-CM

## 2014-06-22 DIAGNOSIS — Z954 Presence of other heart-valve replacement: Secondary | ICD-10-CM

## 2014-06-22 DIAGNOSIS — Z952 Presence of prosthetic heart valve: Secondary | ICD-10-CM

## 2014-06-22 LAB — POCT INR: INR: 3.1

## 2014-07-12 ENCOUNTER — Other Ambulatory Visit: Payer: Self-pay

## 2014-07-12 MED ORDER — WARFARIN SODIUM 5 MG PO TABS: ORAL_TABLET | ORAL | Status: DC

## 2014-08-10 ENCOUNTER — Encounter: Payer: Self-pay | Admitting: Cardiology

## 2014-08-10 ENCOUNTER — Ambulatory Visit (INDEPENDENT_AMBULATORY_CARE_PROVIDER_SITE_OTHER): Payer: Medicare Other | Admitting: *Deleted

## 2014-08-10 ENCOUNTER — Ambulatory Visit (INDEPENDENT_AMBULATORY_CARE_PROVIDER_SITE_OTHER): Payer: Medicare Other | Admitting: Cardiology

## 2014-08-10 VITALS — BP 106/67 | HR 78 | Ht 69.0 in | Wt 151.7 lb

## 2014-08-10 DIAGNOSIS — I428 Other cardiomyopathies: Secondary | ICD-10-CM

## 2014-08-10 DIAGNOSIS — Z952 Presence of prosthetic heart valve: Secondary | ICD-10-CM

## 2014-08-10 DIAGNOSIS — Z954 Presence of other heart-valve replacement: Secondary | ICD-10-CM

## 2014-08-10 DIAGNOSIS — I420 Dilated cardiomyopathy: Secondary | ICD-10-CM

## 2014-08-10 DIAGNOSIS — Z7901 Long term (current) use of anticoagulants: Secondary | ICD-10-CM

## 2014-08-10 DIAGNOSIS — E785 Hyperlipidemia, unspecified: Secondary | ICD-10-CM

## 2014-08-10 LAB — POCT INR: INR: 2.9

## 2014-08-10 NOTE — Patient Instructions (Signed)
Continue your current therapy  I will see you in one year   

## 2014-08-10 NOTE — Progress Notes (Signed)
   Jack Joseph Date of Birth: 1946-07-17   History of Present Illness: Jack Joseph is seen today for followup. 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 He is on chronic Coumadin therapy. He has a history of dilated cardiomyopathy with ejection fraction of 30-35%. He states he is doing very well. He has had no significant symptoms of palpitations, chest pain, or shortness of breath. No bleeding on coumadin.  Current Outpatient Prescriptions on File Prior to Visit  Medication Sig Dispense Refill  . sildenafil (REVATIO) 20 MG tablet Take 20 mg if needed  50 tablet  1  . warfarin (COUMADIN) 5 MG tablet Take as directed by Coumadin Clinic  100 tablet  1   No current facility-administered medications on file prior to visit.    No Known Allergies  Past Medical History  Diagnosis Date  . AVD (aortic valve disease)   . Dilated cardiomyopathy     EF 30-35%  . Hyperlipidemia   . LV dysfunction   . Chronic anticoagulation     Past Surgical History  Procedure Laterality Date  . Aortic valve replacement    . Doppler echocardiography  04/07/2002    EF 30-35%    History  Smoking status  . Former Smoker  . Quit date: 11/12/2000  Smokeless tobacco  . Not on file    History  Alcohol Use No    History reviewed. No pertinent family history.  Review of Systems: As noted in history of present illness.  All other systems were reviewed and are negative.  Physical Exam: BP 106/67  Pulse 78  Ht 5\' 9"  (1.753 m)  Wt 151 lb 11.2 oz (68.811 kg)  BMI 22.39 kg/m2 He is a pleasant white male in no acute distress. HEENT exam is unremarkable. Neck is supple without JVD, adenopathy, thyromegaly, or bruits. Lungs are clear. Cardiac exam reveals a good mechanical aortic valve click without murmur or S3. Abdomen soft and nontender without masses or bruits. He has no lower extremity edema but does have large superficial varicosities in the lower extremities. Skin is warm and  dry. His neurologic exam is nonfocal.  LABORATORY DATA: INR 3.2   Assessment / Plan: 1. 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 He is on chronic Coumadin therapy.  Good click. Continue current therapy.  2. Dilated cardiomyopathy. Ejection fraction of 30-35%. Patient is asymptomatic. We have thoroughly discussed additional medical therapy in the past including beta blockers and ACE inhibitors but the patient does not want to take additional medication at this time.  3. Anticoagulation- therapeutic.

## 2014-09-27 ENCOUNTER — Ambulatory Visit (INDEPENDENT_AMBULATORY_CARE_PROVIDER_SITE_OTHER): Payer: Medicare Other | Admitting: Pharmacist Clinician (PhC)/ Clinical Pharmacy Specialist

## 2014-09-27 DIAGNOSIS — Z954 Presence of other heart-valve replacement: Secondary | ICD-10-CM

## 2014-09-27 DIAGNOSIS — Z7901 Long term (current) use of anticoagulants: Secondary | ICD-10-CM

## 2014-09-27 DIAGNOSIS — Z952 Presence of prosthetic heart valve: Secondary | ICD-10-CM

## 2014-09-27 LAB — POCT INR: INR: 3.2

## 2014-11-08 ENCOUNTER — Ambulatory Visit (INDEPENDENT_AMBULATORY_CARE_PROVIDER_SITE_OTHER): Payer: Medicare Other | Admitting: Pharmacist Clinician (PhC)/ Clinical Pharmacy Specialist

## 2014-11-08 DIAGNOSIS — Z954 Presence of other heart-valve replacement: Secondary | ICD-10-CM

## 2014-11-08 DIAGNOSIS — Z7901 Long term (current) use of anticoagulants: Secondary | ICD-10-CM

## 2014-11-22 ENCOUNTER — Ambulatory Visit: Payer: Medicare Other | Admitting: Pharmacist Clinician (PhC)/ Clinical Pharmacy Specialist

## 2014-11-25 ENCOUNTER — Ambulatory Visit (INDEPENDENT_AMBULATORY_CARE_PROVIDER_SITE_OTHER): Payer: Medicare Other | Admitting: *Deleted

## 2014-11-25 DIAGNOSIS — Z954 Presence of other heart-valve replacement: Secondary | ICD-10-CM

## 2014-11-25 DIAGNOSIS — Z952 Presence of prosthetic heart valve: Secondary | ICD-10-CM

## 2014-11-25 LAB — POCT INR: INR: 2.5

## 2015-01-06 ENCOUNTER — Ambulatory Visit (INDEPENDENT_AMBULATORY_CARE_PROVIDER_SITE_OTHER): Payer: Medicare Other | Admitting: *Deleted

## 2015-01-06 DIAGNOSIS — Z7901 Long term (current) use of anticoagulants: Secondary | ICD-10-CM

## 2015-01-06 DIAGNOSIS — Z952 Presence of prosthetic heart valve: Secondary | ICD-10-CM

## 2015-01-06 DIAGNOSIS — Z954 Presence of other heart-valve replacement: Secondary | ICD-10-CM

## 2015-01-06 LAB — POCT INR: INR: 2.7

## 2015-02-17 ENCOUNTER — Ambulatory Visit (INDEPENDENT_AMBULATORY_CARE_PROVIDER_SITE_OTHER): Payer: Medicare Other | Admitting: Pharmacist Clinician (PhC)/ Clinical Pharmacy Specialist

## 2015-02-17 DIAGNOSIS — Z7901 Long term (current) use of anticoagulants: Secondary | ICD-10-CM | POA: Diagnosis not present

## 2015-02-17 DIAGNOSIS — Z954 Presence of other heart-valve replacement: Secondary | ICD-10-CM | POA: Diagnosis not present

## 2015-02-17 DIAGNOSIS — Z952 Presence of prosthetic heart valve: Secondary | ICD-10-CM

## 2015-02-17 LAB — POCT INR: INR: 3.3

## 2015-03-21 ENCOUNTER — Telehealth: Payer: Self-pay | Admitting: Cardiology

## 2015-03-21 ENCOUNTER — Telehealth: Payer: Self-pay

## 2015-03-21 MED ORDER — WARFARIN SODIUM 5 MG PO TABS: ORAL_TABLET | ORAL | Status: DC

## 2015-03-21 NOTE — Telephone Encounter (Signed)
Pt says he have been waiting since May 2nd. He needs this called in today, He asked to speak to you.

## 2015-03-21 NOTE — Telephone Encounter (Signed)
Error wrong chart

## 2015-03-21 NOTE — Telephone Encounter (Signed)
Spoke with pt and informed him that Refill has been sent, restart and the appt on 03/31/15 is far enough out that his INR should be back in range. Instructed pt to call office when he is running low on pills.

## 2015-03-21 NOTE — Telephone Encounter (Signed)
Received a call patient he stated his pharmacy requested a refill for coumadin last week and never heard back.Stated they requested twice.Stated he has not taken coumadin in 1 week.Stated he has INR appointment 03/31/15 at 4:00 pm.Stated he needs to reschedule since he has not been taking coumadin.Coumadin refill sent to pharmacy.Message sent to coumadin clinic to reschedule INR appointment.

## 2015-03-22 ENCOUNTER — Other Ambulatory Visit: Payer: Self-pay

## 2015-03-22 NOTE — Telephone Encounter (Signed)
Rx(s) sent to pharmacy electronically.  

## 2015-03-24 MED ORDER — WARFARIN SODIUM 5 MG PO TABS: ORAL_TABLET | ORAL | Status: DC

## 2015-03-31 ENCOUNTER — Ambulatory Visit (INDEPENDENT_AMBULATORY_CARE_PROVIDER_SITE_OTHER): Payer: Medicare Other | Admitting: *Deleted

## 2015-03-31 DIAGNOSIS — Z952 Presence of prosthetic heart valve: Secondary | ICD-10-CM

## 2015-03-31 DIAGNOSIS — Z954 Presence of other heart-valve replacement: Secondary | ICD-10-CM | POA: Diagnosis not present

## 2015-03-31 DIAGNOSIS — Z7901 Long term (current) use of anticoagulants: Secondary | ICD-10-CM | POA: Diagnosis not present

## 2015-03-31 LAB — POCT INR: INR: 3.4

## 2015-05-06 ENCOUNTER — Other Ambulatory Visit: Payer: Self-pay | Admitting: Cardiology

## 2015-05-12 ENCOUNTER — Ambulatory Visit (INDEPENDENT_AMBULATORY_CARE_PROVIDER_SITE_OTHER): Payer: Medicare Other | Admitting: *Deleted

## 2015-05-12 DIAGNOSIS — Z7901 Long term (current) use of anticoagulants: Secondary | ICD-10-CM | POA: Diagnosis not present

## 2015-05-12 DIAGNOSIS — Z954 Presence of other heart-valve replacement: Secondary | ICD-10-CM | POA: Diagnosis not present

## 2015-05-12 DIAGNOSIS — Z952 Presence of prosthetic heart valve: Secondary | ICD-10-CM

## 2015-05-12 LAB — POCT INR: INR: 2.5

## 2015-06-23 ENCOUNTER — Ambulatory Visit (INDEPENDENT_AMBULATORY_CARE_PROVIDER_SITE_OTHER): Payer: Medicare Other

## 2015-06-23 DIAGNOSIS — Z952 Presence of prosthetic heart valve: Secondary | ICD-10-CM

## 2015-06-23 DIAGNOSIS — Z7901 Long term (current) use of anticoagulants: Secondary | ICD-10-CM | POA: Diagnosis not present

## 2015-06-23 DIAGNOSIS — Z954 Presence of other heart-valve replacement: Secondary | ICD-10-CM | POA: Diagnosis not present

## 2015-06-23 LAB — POCT INR: INR: 2.6

## 2015-08-12 ENCOUNTER — Ambulatory Visit: Payer: Self-pay | Admitting: Pharmacist Clinician (PhC)/ Clinical Pharmacy Specialist

## 2015-08-12 ENCOUNTER — Ambulatory Visit: Payer: Medicare Other | Admitting: Cardiology

## 2015-08-17 ENCOUNTER — Ambulatory Visit (INDEPENDENT_AMBULATORY_CARE_PROVIDER_SITE_OTHER): Payer: Medicare Other | Admitting: *Deleted

## 2015-08-17 DIAGNOSIS — Z952 Presence of prosthetic heart valve: Secondary | ICD-10-CM

## 2015-08-17 DIAGNOSIS — Z954 Presence of other heart-valve replacement: Secondary | ICD-10-CM | POA: Diagnosis not present

## 2015-08-17 LAB — POCT INR: INR: 3.3

## 2015-09-28 ENCOUNTER — Ambulatory Visit (INDEPENDENT_AMBULATORY_CARE_PROVIDER_SITE_OTHER): Payer: Medicare Other | Admitting: *Deleted

## 2015-09-28 DIAGNOSIS — Z954 Presence of other heart-valve replacement: Secondary | ICD-10-CM

## 2015-09-28 DIAGNOSIS — Z952 Presence of prosthetic heart valve: Secondary | ICD-10-CM

## 2015-09-28 DIAGNOSIS — Z7901 Long term (current) use of anticoagulants: Secondary | ICD-10-CM

## 2015-09-28 LAB — POCT INR: INR: 2.9

## 2015-11-11 ENCOUNTER — Ambulatory Visit (INDEPENDENT_AMBULATORY_CARE_PROVIDER_SITE_OTHER): Payer: Medicare Other | Admitting: *Deleted

## 2015-11-11 DIAGNOSIS — Z7901 Long term (current) use of anticoagulants: Secondary | ICD-10-CM | POA: Diagnosis not present

## 2015-11-11 DIAGNOSIS — Z952 Presence of prosthetic heart valve: Secondary | ICD-10-CM

## 2015-11-11 DIAGNOSIS — Z954 Presence of other heart-valve replacement: Secondary | ICD-10-CM

## 2015-11-11 LAB — POCT INR: INR: 2.2

## 2015-11-29 ENCOUNTER — Ambulatory Visit: Payer: Medicare Other | Admitting: Cardiology

## 2015-12-06 ENCOUNTER — Telehealth: Payer: Self-pay | Admitting: *Deleted

## 2015-12-06 ENCOUNTER — Encounter: Payer: Self-pay | Admitting: Cardiology

## 2015-12-06 ENCOUNTER — Ambulatory Visit (INDEPENDENT_AMBULATORY_CARE_PROVIDER_SITE_OTHER): Payer: Medicare Other | Admitting: Cardiology

## 2015-12-06 VITALS — BP 122/80 | HR 108 | Ht 69.75 in | Wt 157.0 lb

## 2015-12-06 DIAGNOSIS — Z952 Presence of prosthetic heart valve: Secondary | ICD-10-CM

## 2015-12-06 DIAGNOSIS — I420 Dilated cardiomyopathy: Secondary | ICD-10-CM | POA: Diagnosis not present

## 2015-12-06 DIAGNOSIS — Z7901 Long term (current) use of anticoagulants: Secondary | ICD-10-CM

## 2015-12-06 DIAGNOSIS — I359 Nonrheumatic aortic valve disorder, unspecified: Secondary | ICD-10-CM | POA: Diagnosis not present

## 2015-12-06 DIAGNOSIS — Z954 Presence of other heart-valve replacement: Secondary | ICD-10-CM | POA: Diagnosis not present

## 2015-12-06 MED ORDER — SILDENAFIL CITRATE 50 MG PO TABS: 50.0000 mg | ORAL_TABLET | Freq: Every day | ORAL | Status: DC | PRN

## 2015-12-06 NOTE — Patient Instructions (Addendum)
Medication Instructions:  Your physician recommends that you continue on your current medications as directed. Please refer to the Current Medication list given to you today.   Labwork: none  Testing/Procedures: none  Follow-Up: Your physician wants you to follow-up in: 12 months with Dr. Jordan. You will receive a reminder letter in the mail two months in advance. If you don't receive a letter, please call our office to schedule the follow-up appointment.   Any Other Special Instructions Will Be Listed Below (If Applicable).     If you need a refill on your cardiac medications before your next appointment, please call your pharmacy.    

## 2015-12-06 NOTE — Progress Notes (Signed)
   Jack Joseph Date of Birth: Aug 30, 1946   History of Present Illness: Jack Joseph is seen today for followup. 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 He is on chronic Coumadin therapy. He has a history of dilated cardiomyopathy with ejection fraction of 30-35%. He has consistently refused additional medical therapy such as ACEi or beta blockers. He states he is doing very well. He has had no significant symptoms of palpitations, chest pain, or shortness of breath. No bleeding on coumadin. No dizziness. Still working.   Current Outpatient Prescriptions on File Prior to Visit  Medication Sig Dispense Refill  . sildenafil (REVATIO) 20 MG tablet TAKE ONE TABLET BY MOUTH IF NEEDED AS DIRECTED 50 tablet 1  . warfarin (COUMADIN) 5 MG tablet Take 1 tablet by mouth daily or as directed by Coumadin Clinic 90 tablet 1   No current facility-administered medications on file prior to visit.    No Known Allergies  Past Medical History  Diagnosis Date  . AVD (aortic valve disease)   . Dilated cardiomyopathy (HCC)     EF 30-35%  . Hyperlipidemia   . LV dysfunction   . Chronic anticoagulation     Past Surgical History  Procedure Laterality Date  . Aortic valve replacement    . Doppler echocardiography  04/07/2002    EF 30-35%    History  Smoking status  . Former Smoker  . Quit date: 11/12/2000  Smokeless tobacco  . Not on file    History  Alcohol Use No    History reviewed. No pertinent family history.  Review of Systems: As noted in history of present illness.  All other systems were reviewed and are negative.  Physical Exam: BP 122/80 mmHg  Pulse 108  Ht 5' 9.75" (1.772 m)  Wt 71.215 kg (157 lb)  BMI 22.68 kg/m2 He is a pleasant white male in no acute distress. HEENT exam is unremarkable. Neck is supple without JVD, adenopathy, thyromegaly, or bruits. Lungs are clear. Cardiac exam reveals a good mechanical aortic valve click  without murmur or S3. Abdomen soft and nontender without masses or bruits. He has no lower extremity edema but does have large superficial varicosities in the lower extremities. Skin is warm and dry. His neurologic exam is nonfocal.  LABORATORY DATA: INR 2.2 on Dec. 30, 2016.  Ecg today shows sinus tachy with rate 108. Incomplete LBBB, diffuse ST depression.    Assessment / Plan: 1. 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 He is on chronic Coumadin therapy.  Good click. Continue current therapy.  2. Dilated cardiomyopathy. Ejection fraction of 30-35%. Patient is asymptomatic. We have thoroughly discussed additional medical therapy in the past including beta blockers and ACE inhibitors but the patient does not want to take additional medication at this time.  3. Anticoagulation- therapeutic. Follow up with pharmacy.

## 2015-12-06 NOTE — Telephone Encounter (Signed)
Patient arrived for his appointment with Dr. Peter Swaziland.  Patient refused to present his insurance card and verify his demographic information.  He stated that [address] has stopped asking for his information since he always refuses to comply.  Miguel Dibble located a copy of his Medicare Card in his "chart" and the current information was E-verified.

## 2015-12-22 ENCOUNTER — Other Ambulatory Visit: Payer: Self-pay | Admitting: Cardiology

## 2015-12-22 NOTE — Telephone Encounter (Signed)
Rx request sent to pharmacy.  

## 2015-12-23 ENCOUNTER — Ambulatory Visit (INDEPENDENT_AMBULATORY_CARE_PROVIDER_SITE_OTHER): Payer: Medicare Other | Admitting: *Deleted

## 2015-12-23 DIAGNOSIS — Z954 Presence of other heart-valve replacement: Secondary | ICD-10-CM

## 2015-12-23 DIAGNOSIS — Z7901 Long term (current) use of anticoagulants: Secondary | ICD-10-CM

## 2015-12-23 DIAGNOSIS — Z952 Presence of prosthetic heart valve: Secondary | ICD-10-CM

## 2015-12-23 LAB — POCT INR: INR: 3

## 2016-02-03 ENCOUNTER — Ambulatory Visit (INDEPENDENT_AMBULATORY_CARE_PROVIDER_SITE_OTHER): Payer: Medicare Other | Admitting: *Deleted

## 2016-02-03 DIAGNOSIS — Z954 Presence of other heart-valve replacement: Secondary | ICD-10-CM | POA: Diagnosis not present

## 2016-02-03 DIAGNOSIS — Z952 Presence of prosthetic heart valve: Secondary | ICD-10-CM

## 2016-02-03 DIAGNOSIS — Z7901 Long term (current) use of anticoagulants: Secondary | ICD-10-CM

## 2016-02-03 LAB — POCT INR: INR: 2.5

## 2016-03-16 ENCOUNTER — Ambulatory Visit (INDEPENDENT_AMBULATORY_CARE_PROVIDER_SITE_OTHER): Payer: Medicare Other | Admitting: *Deleted

## 2016-03-16 DIAGNOSIS — Z7901 Long term (current) use of anticoagulants: Secondary | ICD-10-CM | POA: Diagnosis not present

## 2016-03-16 DIAGNOSIS — Z952 Presence of prosthetic heart valve: Secondary | ICD-10-CM

## 2016-03-16 DIAGNOSIS — Z954 Presence of other heart-valve replacement: Secondary | ICD-10-CM | POA: Diagnosis not present

## 2016-03-16 LAB — POCT INR: INR: 2.7

## 2016-05-04 ENCOUNTER — Ambulatory Visit (INDEPENDENT_AMBULATORY_CARE_PROVIDER_SITE_OTHER): Payer: Medicare Other | Admitting: *Deleted

## 2016-05-04 DIAGNOSIS — Z954 Presence of other heart-valve replacement: Secondary | ICD-10-CM | POA: Diagnosis not present

## 2016-05-04 DIAGNOSIS — Z7901 Long term (current) use of anticoagulants: Secondary | ICD-10-CM

## 2016-05-04 DIAGNOSIS — Z952 Presence of prosthetic heart valve: Secondary | ICD-10-CM

## 2016-05-04 LAB — POCT INR: INR: 2.3

## 2016-06-15 ENCOUNTER — Ambulatory Visit (INDEPENDENT_AMBULATORY_CARE_PROVIDER_SITE_OTHER): Payer: Medicare Other

## 2016-06-15 DIAGNOSIS — Z954 Presence of other heart-valve replacement: Secondary | ICD-10-CM

## 2016-06-15 DIAGNOSIS — Z952 Presence of prosthetic heart valve: Secondary | ICD-10-CM

## 2016-06-15 DIAGNOSIS — Z7901 Long term (current) use of anticoagulants: Secondary | ICD-10-CM | POA: Diagnosis not present

## 2016-06-15 LAB — POCT INR: INR: 2.7

## 2016-06-27 ENCOUNTER — Other Ambulatory Visit: Payer: Self-pay | Admitting: Cardiology

## 2016-07-02 ENCOUNTER — Other Ambulatory Visit: Payer: Self-pay | Admitting: *Deleted

## 2016-07-02 MED ORDER — WARFARIN SODIUM 5 MG PO TABS: ORAL_TABLET | ORAL | 1 refills | Status: DC

## 2016-07-06 ENCOUNTER — Other Ambulatory Visit: Payer: Self-pay | Admitting: *Deleted

## 2016-07-06 MED ORDER — WARFARIN SODIUM 5 MG PO TABS: ORAL_TABLET | ORAL | 1 refills | Status: DC

## 2016-07-06 NOTE — Telephone Encounter (Signed)
Patient stated that his rx for warfarin was sent to the wrong pharmacy. He does not use [facility] for this medication. He would like for it to be sent to [facility].

## 2016-07-10 ENCOUNTER — Other Ambulatory Visit: Payer: Self-pay | Admitting: Pharmacist Clinician (PhC)/ Clinical Pharmacy Specialist

## 2016-07-10 ENCOUNTER — Encounter: Payer: Self-pay | Admitting: Family Medicine

## 2016-07-10 ENCOUNTER — Ambulatory Visit (INDEPENDENT_AMBULATORY_CARE_PROVIDER_SITE_OTHER): Payer: Medicare Other | Admitting: Family Medicine

## 2016-07-10 VITALS — BP 130/70 | HR 71 | Temp 97.6°F | Ht 69.75 in | Wt 154.0 lb

## 2016-07-10 DIAGNOSIS — N529 Male erectile dysfunction, unspecified: Secondary | ICD-10-CM | POA: Diagnosis not present

## 2016-07-10 DIAGNOSIS — I5022 Chronic systolic (congestive) heart failure: Secondary | ICD-10-CM | POA: Diagnosis not present

## 2016-07-10 DIAGNOSIS — Z7901 Long term (current) use of anticoagulants: Secondary | ICD-10-CM

## 2016-07-10 DIAGNOSIS — E785 Hyperlipidemia, unspecified: Secondary | ICD-10-CM

## 2016-07-10 DIAGNOSIS — I509 Heart failure, unspecified: Secondary | ICD-10-CM | POA: Insufficient documentation

## 2016-07-10 DIAGNOSIS — I8393 Asymptomatic varicose veins of bilateral lower extremities: Secondary | ICD-10-CM | POA: Diagnosis not present

## 2016-07-10 DIAGNOSIS — Z0000 Encounter for general adult medical examination without abnormal findings: Secondary | ICD-10-CM

## 2016-07-10 LAB — BASIC METABOLIC PANEL
BUN: 15 mg/dL (ref 6–23)
CALCIUM: 9.6 mg/dL (ref 8.4–10.5)
CO2: 33 meq/L — AB (ref 19–32)
Chloride: 99 mEq/L (ref 96–112)
Creatinine, Ser: 1.2 mg/dL (ref 0.40–1.50)
GFR: 63.39 mL/min (ref >60.00)
Glucose, Bld: 92 mg/dL (ref 70–99)
Potassium: 4.8 mEq/L (ref 3.5–5.1)
Sodium: 137 mEq/L (ref 135–145)

## 2016-07-10 MED ORDER — WARFARIN SODIUM 5 MG PO TABS: ORAL_TABLET | ORAL | 1 refills | Status: DC

## 2016-07-10 NOTE — Progress Notes (Signed)
HPI:   Jack Joseph is a 70 y.o. male, who is here today to establish care with me.  Former PCP: He has been following with Jack Joseph, cardiologists, for years. Last preventive routine visit: Not sure.   Concerns today: none   He lives with wife. Independent ADL's and IADL's. No falls in the past year and denies depression symptoms. He still works, about 30 hours per week.  He takes Aleve sometimes for arthralgias, left hand.   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  He is on chronic Coumadin therapy, INR goal 2.5-3.0, follows with Coumadin clinic q 6 weeks.   -Hx of CHF, Echo in 03/2002 show LVEF 30-35% and severe hypokinesia LV. He is not on ACEI or BB.  Denies severe/frequent headache, visual changes, chest pain, dyspnea, palpitation, claudication, focal weakness, or significant edema. Sleeps on a pillow, no orthopnea or PND.  -Hx of ED, he is currently on Viagra (changed recently because generic was not helping), does not take it frequently, denies side effects. Denies gross hematuria, + nocturia x 1-2, stable.  He follows a low salt diet, no other specific diet, states that he drinks a lot sweet tea. He does not exercise regularly but is active through his job.  He states that he does not take pneumonia vaccines. He had colonoscopy in 2005, not interested in another one. Quit smoking about 10-12 years ago, not interested in having abdominal U/S to screen for AAA.    Review of Systems  Constitutional: Negative for activity change, appetite change, fatigue, fever and unexpected weight change.  HENT: Negative for nosebleeds, sore throat and trouble swallowing.   Eyes: Negative for redness and visual disturbance.  Respiratory: Negative for cough, shortness of breath and wheezing.   Cardiovascular: Negative for chest pain, palpitations and leg swelling.  Gastrointestinal: Negative for  abdominal pain, blood in stool, nausea and vomiting.       No changes in bowel habits.  Genitourinary: Negative for decreased urine volume, dysuria and hematuria.       Nocturia x 1-2, chronic and stable   Musculoskeletal: Positive for arthralgias. Negative for gait problem and myalgias.  Skin: Negative for color change and rash.  Neurological: Negative for dizziness, syncope, weakness, numbness and headaches.  Psychiatric/Behavioral: Negative for confusion and sleep disturbance. The patient is not nervous/anxious.       Current Outpatient Prescriptions on File Prior to Visit  Medication Sig Dispense Refill  . sildenafil (REVATIO) 20 MG tablet TAKE 1-5 TABLETS BY MOUTH AS NEEDED FOR SEXUAL ACTIVITY 50 tablet 1  . warfarin (COUMADIN) 5 MG tablet Take 1 tablet by mouth daily or as directed by Coumadin Clinic 90 tablet 1   No current facility-administered medications on file prior to visit.      Past Medical History:  Diagnosis Date  . AVD (aortic valve disease)   . Chronic anticoagulation   . Dilated cardiomyopathy (HCC)    EF 30-35%  . Hyperlipidemia   . LV dysfunction    No Known Allergies  Family History  Problem Relation Age of Onset  . Heart disease Brother     Social History   Social History  . Marital status: Single    Spouse name: N/A  . Number of children: N/A  . Years of education: N/A   Social History Main Topics  . Smoking status: Former Smoker    Quit date: 11/12/2000  . Smokeless  tobacco: Never Used  . Alcohol use No  . Drug use: No  . Sexual activity: Yes   Other Topics Concern  . None   Social History Narrative  . None    Vitals:   07/10/16 1256  BP: 130/70  Pulse: 71  Temp: 97.6 F (36.4 C)     Body mass index is 22.26 kg/m.    Physical Exam  Nursing note and vitals reviewed. Constitutional: He is oriented to person, place, and time. He appears well-developed and well-nourished. No distress.  HENT:  Head: Atraumatic.    Mouth/Throat: Oropharynx is clear and moist and mucous membranes are normal. He has dentures.  Eyes: Conjunctivae and EOM are normal. Pupils are equal, round, and reactive to light.  Neck: No JVD present. No thyroid mass and no thyromegaly present.  Cardiovascular: Normal rate and regular rhythm.   Occasional extrasystoles (1-2 in 1 min) are present.  No murmur heard. Pulses:      Dorsalis pedis pulses are 2+ on the right side, and 2+ on the left side.  Varicose veins R>L  Respiratory: Effort normal and breath sounds normal. No respiratory distress.  GI: Soft. He exhibits no mass. There is no hepatomegaly. There is no tenderness.  Musculoskeletal: He exhibits edema (1+ pitting LE edema R>L).  Lymphadenopathy:    He has no cervical adenopathy.  Neurological: He is alert and oriented to person, place, and time. He has normal strength. Coordination and gait normal.  Skin: Skin is warm. No erythema.  Psychiatric: He has a normal mood and affect.  Well groomed, good eye contact.      ASSESSMENT AND PLAN:     [name] was seen today for new patient (initial visit).  Diagnoses and all orders for this visit:  Chronic systolic congestive heart failure [facility](HCC)  Continue following with cardiologist, Jack. SwazilandJordan. Recommend low salt diet and caution with fluid intake. He is not interested in pharmacologic treatment.  -     Basic Metabolic Panel  Chronic anticoagulation  Continue following up with Coumadin clinic every 6 weeks. Some side effects of anticoagulation therapy discussed, so he needs to be cautious with OTC NSAIDs. Not interested in CBC.  Erectile dysfunction, unspecified erectile dysfunction type  Some side effects of Viagra discussed, he states that he takes it very seldom. No changes in current management.  Varicose veins of both lower extremities  Moderate, R>L . This may be contributing/causing mild lower extremity edema. Elevation of lower extremities a few  times per day may help.  Hyperlipidemia  Today he is not fasting. He seems like he didn't show for lab appointment in 2015 when labs were scheduled. We may check FLP next office visit but he is not interested in adding medications. Low fat diet recommended.  Healthcare maintenance  He is not interested in meeting with Ms Darl PikesSusan for Medicare preventive visit, he agrees for me to do it in a year.  Refused AAA screening, vaccination, and no interested in colonoscopy. Fall precautions discussed.       Betty G. SwazilandJordan, MD  [facility]. [place] office.

## 2016-07-10 NOTE — Progress Notes (Signed)
Pre visit review using our clinic review tool, if applicable. No additional management support is needed unless otherwise documented below in the visit note. 

## 2016-07-10 NOTE — Patient Instructions (Addendum)
A few things to remember from today's visit:   Chronic systolic congestive heart failure (HCC) - Plan: Basic Metabolic Panel  Varicose veins of both lower extremities  Lower extremity elevation may help with leg swelling.  Continue following with Dr Swaziland.  I will see you in a year for Medicare preventive care. Low salt diet and caution with fluid intake, your heart can be overwhelmed.   Please be sure medication list is accurate. If a new problem present, please set up appointment sooner than planned today.

## 2016-07-27 ENCOUNTER — Ambulatory Visit (INDEPENDENT_AMBULATORY_CARE_PROVIDER_SITE_OTHER): Payer: Medicare Other | Admitting: Pharmacist

## 2016-07-27 DIAGNOSIS — Z7901 Long term (current) use of anticoagulants: Secondary | ICD-10-CM

## 2016-07-27 DIAGNOSIS — Z954 Presence of other heart-valve replacement: Secondary | ICD-10-CM

## 2016-07-27 DIAGNOSIS — Z952 Presence of prosthetic heart valve: Secondary | ICD-10-CM

## 2016-07-27 LAB — POCT INR: INR: 3

## 2016-09-17 ENCOUNTER — Ambulatory Visit (INDEPENDENT_AMBULATORY_CARE_PROVIDER_SITE_OTHER): Payer: Medicare Other

## 2016-09-17 DIAGNOSIS — Z952 Presence of prosthetic heart valve: Secondary | ICD-10-CM | POA: Diagnosis not present

## 2016-09-17 DIAGNOSIS — Z7901 Long term (current) use of anticoagulants: Secondary | ICD-10-CM | POA: Diagnosis not present

## 2016-09-17 LAB — POCT INR: INR: 3.2

## 2016-10-19 ENCOUNTER — Other Ambulatory Visit: Payer: Self-pay | Admitting: Cardiology

## 2016-10-25 ENCOUNTER — Ambulatory Visit (INDEPENDENT_AMBULATORY_CARE_PROVIDER_SITE_OTHER): Payer: Medicare Other | Admitting: Physician Assistant

## 2016-10-25 VITALS — BP 120/70 | HR 103 | Temp 98.4°F | Resp 17 | Ht 69.75 in | Wt 155.0 lb

## 2016-10-25 DIAGNOSIS — M79662 Pain in left lower leg: Secondary | ICD-10-CM | POA: Diagnosis not present

## 2016-10-25 DIAGNOSIS — I8393 Asymptomatic varicose veins of bilateral lower extremities: Secondary | ICD-10-CM | POA: Diagnosis not present

## 2016-10-25 DIAGNOSIS — Z87891 Personal history of nicotine dependence: Secondary | ICD-10-CM | POA: Diagnosis not present

## 2016-10-25 DIAGNOSIS — Z7901 Long term (current) use of anticoagulants: Secondary | ICD-10-CM

## 2016-10-25 DIAGNOSIS — Z952 Presence of prosthetic heart valve: Secondary | ICD-10-CM

## 2016-10-25 DIAGNOSIS — I739 Peripheral vascular disease, unspecified: Secondary | ICD-10-CM | POA: Diagnosis not present

## 2016-10-25 NOTE — Progress Notes (Signed)
Jack Joseph  MRN: [id number] DOB: 25-Mar-1946  PCP: Betty Swaziland, MD  Subjective:  Pt is a pleasant 70 year old male PMH CHF, aortic valve replacement surgery, chronic anticoagulation therapy, HLD who presents to clinic for left lower leg pain x three days. He "woke up one morning and the pain was just there". No MOI.  He has a difficult time localizing his pain, but states "it's either in my knee or the back of my calf". He is using his wife's knee brace for pain, is unsure whether it is providing relief. His calf pain is located in the upper aspect of his calf. He walks with a limp due to pain. Pain is present with walking, pain is relieved when he stops walking.   Denies chest pain, SOB, wheezing, pain with deep inspiration, redness or swelling of leg, cough, non-healing wounds, pain in his hip or thigh.   History of aortic valve replacement in 1983 - Daily Warfarin 5mg . PT/INR checked regularly. Next appt is in 4 days.   History of smoking - 80 pack year history. He quit 10 years ago.  Review of Systems  Constitutional: Negative for chills, diaphoresis and fever.  Respiratory: Negative for cough, chest tightness, shortness of breath and wheezing.   Cardiovascular: Negative for chest pain, palpitations and leg swelling.  Gastrointestinal: Negative for diarrhea, nausea and vomiting.  Musculoskeletal: Positive for gait problem and myalgias (left lower leg).  Skin: Negative for color change and pallor.  Neurological: Negative for dizziness, syncope, weakness, light-headedness and headaches.    Patient Active Problem List   Diagnosis Date Noted  . CHF (congestive heart failure) (HCC) 07/10/2016  . Varicose veins of both lower extremities 07/10/2016  . Erectile dysfunction 07/10/2016  . AVD (aortic valve disease)   . Dilated cardiomyopathy (HCC)   . Hyperlipidemia   . Chronic anticoagulation   . Hx of replacement of aortic valve 03/15/2011    Current Outpatient Prescriptions  on File Prior to Visit  Medication Sig Dispense Refill  . sildenafil (REVATIO) 20 MG tablet TAKE 1-5 TABLETS BY MOUTH AS NEEDED FOR SEXUAL ACTIVITY 50 tablet 1  . warfarin (COUMADIN) 5 MG tablet Take 1 tablet by mouth daily or as directed by Coumadin Clinic 90 tablet 1   No current facility-administered medications on file prior to visit.     No Known Allergies   Objective:  BP 120/70 (BP Location: Right Arm, Patient Position: Sitting, Cuff Size: Normal)   Pulse (!) 103   Temp 98.4 F (36.9 C)   Resp 17   Ht 5' 9.75" (1.772 m)   Wt 155 lb (70.3 kg)   SpO2 96%   BMI 22.40 kg/m   Physical Exam  Constitutional: He is oriented to person, place, and time and well-developed, well-nourished, and in no distress. No distress.  Cardiovascular: Normal rate.  An irregular rhythm present.  Murmur heard. Musculoskeletal:  Left calf is not visibly swollen, no skin color changes when comparing right vs left, no pallor, there are no ulcerations. No erythema or warmth appreciated. Right calf 40 cm, Left calf 41.2 cm. Patient experienced posterior lower leg pain at the upper 1/3 during initial movement of exam. However, the pain was not reproducible. Posterior calf not TTP.  Pitting edema b/l lower legs. Varicose veins b/l lower legs.   Neurological: He is alert and oriented to person, place, and time. GCS score is 15.  Skin: Skin is warm and dry.  Psychiatric: Mood, memory, affect and judgment normal.  Vitals reviewed.   Assessment and Plan :  This case was discussed with Dr. Gwendolyn GrantWalden  1. Claudication (HCC) 2. Pain of left calf 3. Hx of replacement of aortic valve 4. Chronic anticoagulation 5. Varicose veins of both lower extremities 6. History of smoking greater than 50 pack years - Ambulatory referral to Vascular Surgery - Concern for peripheral artery disease due to this patient's age, smoking history, and claudication. Will make urgent referral for further work-up, possible ABI.  Discussed plan with patient. He understands and agrees.   Marco CollieWhitney Lamonte Hartt, PA-C  [facility] 10/25/2016 5:47 PM

## 2016-10-25 NOTE — Patient Instructions (Addendum)
I have referred you to Vascular specialists to further evaluate your calf pain. Please make an appointment when they call you.  Thank you for coming in today. I hope you feel we met your needs.  Feel free to call [facility] if you have any questions or further requests.  Please consider signing up for MyChart if you do not already have it, as this is a great way to communicate with me.  Best,  Whitney McVey, PA-C   IF you received an x-ray today, you will receive an invoice from [facility]. Please contact [facility] at [phone] with questions or concerns regarding your invoice.   IF you received labwork today, you will receive an invoice from [facility]. Please contact [facility] at [phone] with questions or concerns regarding your invoice.   Our billing staff will not be able to assist you with questions regarding bills from these companies.  You will be contacted with the lab results as soon as they are available. The fastest way to get your results is to activate your My Chart account. Instructions are located on the last page of this paperwork. If you have not heard from [place] regarding the results in 2 weeks, please contact this office.

## 2016-10-27 ENCOUNTER — Telehealth: Payer: Self-pay

## 2016-10-27 NOTE — Telephone Encounter (Signed)
Error

## 2016-11-02 ENCOUNTER — Ambulatory Visit (INDEPENDENT_AMBULATORY_CARE_PROVIDER_SITE_OTHER): Payer: Medicare Other | Admitting: *Deleted

## 2016-11-02 DIAGNOSIS — Z7901 Long term (current) use of anticoagulants: Secondary | ICD-10-CM

## 2016-11-02 DIAGNOSIS — Z952 Presence of prosthetic heart valve: Secondary | ICD-10-CM

## 2016-11-02 LAB — POCT INR: INR: 5.7

## 2016-11-27 ENCOUNTER — Encounter: Payer: Self-pay | Admitting: Vascular Surgery

## 2016-11-27 ENCOUNTER — Encounter (HOSPITAL_COMMUNITY): Payer: Self-pay

## 2016-12-19 ENCOUNTER — Telehealth: Payer: Self-pay | Admitting: Cardiology

## 2016-12-19 NOTE — Telephone Encounter (Signed)
Records received from [facility] for apt on 01/07/17 with Dr Swaziland, records placed in Dr Illa Level schedule for 01/07/17. CN

## 2016-12-21 ENCOUNTER — Ambulatory Visit (INDEPENDENT_AMBULATORY_CARE_PROVIDER_SITE_OTHER): Payer: Medicare Other | Admitting: Pharmacist

## 2016-12-21 DIAGNOSIS — Z7901 Long term (current) use of anticoagulants: Secondary | ICD-10-CM

## 2016-12-21 DIAGNOSIS — Z952 Presence of prosthetic heart valve: Secondary | ICD-10-CM

## 2016-12-21 LAB — PROTIME-INR
INR: 5.7 — AB (ref 0.8–1.2)
Prothrombin Time: 56.8 s — ABNORMAL HIGH (ref 9.1–12.0)

## 2016-12-21 LAB — POCT INR: INR: 6.8

## 2016-12-28 ENCOUNTER — Encounter: Payer: Self-pay | Admitting: Cardiology

## 2016-12-28 ENCOUNTER — Ambulatory Visit (INDEPENDENT_AMBULATORY_CARE_PROVIDER_SITE_OTHER): Payer: Medicare Other | Admitting: *Deleted

## 2016-12-28 DIAGNOSIS — Z7901 Long term (current) use of anticoagulants: Secondary | ICD-10-CM

## 2016-12-28 DIAGNOSIS — Z952 Presence of prosthetic heart valve: Secondary | ICD-10-CM

## 2016-12-28 LAB — POCT INR: INR: 1.7

## 2017-01-06 NOTE — Progress Notes (Signed)
Jack Joseph Date of Birth: 1945/12/05   History of Present Illness: Jack Joseph is seen today for followup. 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 He is on chronic Coumadin therapy. He has a history of dilated cardiomyopathy with ejection fraction of 30-35%. He has consistently refused additional medical therapy such as ACEi or beta blockers.   On follow up today He notes a 13 lb weight loss since December. He thinks its just because he isn't eating as much. Denies change in appetite or bowels. No bleeding. No chest pain. Some chronic dyspnea. Quit smoking 2002.  In December he developed pain and swelling in left foot. Saw ortho and had cortisone injection. In January right foot was swollen and painful. Given steroid taper and colchicine. Told his uric acid level was normal. Given compression hose with resolution of swelling.   Current Outpatient Prescriptions on File Prior to Visit  Medication Sig Dispense Refill  . sildenafil (REVATIO) 20 MG tablet TAKE 1-5 TABLETS BY MOUTH AS NEEDED FOR SEXUAL ACTIVITY 50 tablet 1  . warfarin (COUMADIN) 5 MG tablet Take 1 tablet by mouth daily or as directed by [facility] 90 tablet 1   No current facility-administered medications on file prior to visit.     No Known Allergies  Past Medical History:  Diagnosis Date  . AVD (aortic valve disease)   . Chronic anticoagulation   . Dilated cardiomyopathy (HCC)    EF 30-35%  . Hyperlipidemia   . LV dysfunction     Past Surgical History:  Procedure Laterality Date  . AORTIC VALVE REPLACEMENT    . DOPPLER ECHOCARDIOGRAPHY  04/07/2002   EF 30-35%    History  Smoking Status  . Former Smoker  . Quit date: 11/12/2000  Smokeless Tobacco  . Never Used    History  Alcohol Use No    Family History  Problem Relation Age of Onset  . Heart disease Brother     Review of Systems: As noted in history of present illness.  All other systems were  reviewed and are negative.  Physical Exam: BP 106/60   Pulse 85   Ht 5\' 10"  (1.778 m)   Wt 142 lb 12.8 oz (64.8 kg)   BMI 20.49 kg/m  He is a pleasant thin white male in no acute distress. Appears much thinner than before. HEENT exam is unremarkable. Neck is supple without JVD, adenopathy, thyromegaly, or bruits. Lungs reveal coarse wheezes in right lung. Cardiac exam reveals a good mechanical aortic valve click without murmur or S3. Abdomen soft and nontender without masses. He does have a midline abdominal bruit.  He has no lower extremity edema but does have large superficial varicosities in the lower extremities. Skin is warm and dry. His neurologic exam is nonfocal.  LABORATORY DATA:  Lab Results  Component Value Date   GLUCOSE 92 07/10/2016   NA 137 07/10/2016   K 4.8 07/10/2016   CL 99 07/10/2016   CREATININE 1.20 07/10/2016   BUN 15 07/10/2016   CO2 33 (H) 07/10/2016   INR 3.3 01/07/2017    INR 3.3 today  Ecg today shows sinus with rate 85. Nonspecific IVCD,  diffuse ST depression. No change from Jan 2017. I have personally reviewed and interpreted this study.    Assessment / Plan: 1. 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 He is on chronic Coumadin therapy.  Good click. Continue current therapy.  2.  Dilated cardiomyopathy. Ejection fraction of 30-35%. Patient is asymptomatic. We have thoroughly discussed additional medical therapy in the past including beta blockers and ACE inhibitors but the patient does not want to take additional medication at this time. Not interested in advanced imaging such as Echo.  3. Anticoagulation- therapeutic. Follow up with pharmacy. Recent therapy with steroids caused INR to increase to 6.8. Now back in therapeutic range.  4. Weight loss. Significant weight loss in last 2 months without clear cause. Recommend evaluation with complete lab work including chemistries, CBC, TSH, lipids and PSA. Will also  check CXR given long history of smoking in the past. Encourage increased calorie intake. He is not interested in advanced imaging such as CT.  5. Abdominal bruit. On exam aorta does not feel aneurysmal. Patient defers ultrasound.

## 2017-01-07 ENCOUNTER — Ambulatory Visit (INDEPENDENT_AMBULATORY_CARE_PROVIDER_SITE_OTHER): Payer: Medicare Other | Admitting: Pharmacist Clinician (PhC)/ Clinical Pharmacy Specialist

## 2017-01-07 ENCOUNTER — Encounter: Payer: Self-pay | Admitting: Cardiology

## 2017-01-07 ENCOUNTER — Ambulatory Visit (INDEPENDENT_AMBULATORY_CARE_PROVIDER_SITE_OTHER): Payer: Medicare Other | Admitting: Cardiology

## 2017-01-07 VITALS — BP 106/60 | HR 85 | Ht 70.0 in | Wt 142.8 lb

## 2017-01-07 DIAGNOSIS — I5022 Chronic systolic (congestive) heart failure: Secondary | ICD-10-CM | POA: Diagnosis not present

## 2017-01-07 DIAGNOSIS — R634 Abnormal weight loss: Secondary | ICD-10-CM | POA: Diagnosis not present

## 2017-01-07 DIAGNOSIS — E785 Hyperlipidemia, unspecified: Secondary | ICD-10-CM

## 2017-01-07 DIAGNOSIS — Z7901 Long term (current) use of anticoagulants: Secondary | ICD-10-CM | POA: Diagnosis not present

## 2017-01-07 DIAGNOSIS — R351 Nocturia: Secondary | ICD-10-CM

## 2017-01-07 DIAGNOSIS — Z952 Presence of prosthetic heart valve: Secondary | ICD-10-CM

## 2017-01-07 DIAGNOSIS — N529 Male erectile dysfunction, unspecified: Secondary | ICD-10-CM

## 2017-01-07 LAB — POCT INR: INR: 3.3

## 2017-01-07 NOTE — Patient Instructions (Signed)
Fasting lab ( cmet,lipid,cbc,tsh,psa )    Chest Xray    Your physician wants you to follow-up in: 1 year. You will receive a reminder letter in the mail two months in advance. If you don't receive a letter, please call our office to schedule the follow-up appointment.

## 2017-01-09 ENCOUNTER — Ambulatory Visit
Admission: RE | Admit: 2017-01-09 | Discharge: 2017-01-09 | Disposition: A | Discharge status: Home / Self Care | Payer: Medicare Other | Source: Ambulatory Visit | Attending: Cardiology | Admitting: Cardiology

## 2017-01-09 DIAGNOSIS — R351 Nocturia: Secondary | ICD-10-CM

## 2017-01-09 DIAGNOSIS — N529 Male erectile dysfunction, unspecified: Secondary | ICD-10-CM

## 2017-01-09 DIAGNOSIS — R634 Abnormal weight loss: Secondary | ICD-10-CM

## 2017-01-09 DIAGNOSIS — I5022 Chronic systolic (congestive) heart failure: Secondary | ICD-10-CM

## 2017-01-09 DIAGNOSIS — E785 Hyperlipidemia, unspecified: Secondary | ICD-10-CM

## 2017-01-10 ENCOUNTER — Other Ambulatory Visit: Payer: Medicare Other

## 2017-01-15 ENCOUNTER — Other Ambulatory Visit: Payer: Self-pay

## 2017-01-15 DIAGNOSIS — R351 Nocturia: Secondary | ICD-10-CM

## 2017-01-15 DIAGNOSIS — R9389 Abnormal findings on diagnostic imaging of other specified body structures: Secondary | ICD-10-CM

## 2017-01-15 DIAGNOSIS — I420 Dilated cardiomyopathy: Secondary | ICD-10-CM

## 2017-01-15 DIAGNOSIS — I5020 Unspecified systolic (congestive) heart failure: Secondary | ICD-10-CM

## 2017-01-15 NOTE — Progress Notes (Signed)
Spoke to patient CXR results given.Patient will have fasting lab ( psa,tsh,cbc,lipid,cmet ) done 01/16/17.Scheduler will call back to schedule chest ct.

## 2017-01-18 ENCOUNTER — Telehealth: Payer: Self-pay

## 2017-01-18 ENCOUNTER — Other Ambulatory Visit: Payer: Self-pay

## 2017-01-18 DIAGNOSIS — Z7901 Long term (current) use of anticoagulants: Secondary | ICD-10-CM

## 2017-01-18 DIAGNOSIS — I359 Nonrheumatic aortic valve disorder, unspecified: Secondary | ICD-10-CM

## 2017-01-18 DIAGNOSIS — R350 Frequency of micturition: Secondary | ICD-10-CM

## 2017-01-18 DIAGNOSIS — I5020 Unspecified systolic (congestive) heart failure: Secondary | ICD-10-CM

## 2017-01-18 DIAGNOSIS — E7800 Pure hypercholesterolemia, unspecified: Secondary | ICD-10-CM

## 2017-01-18 DIAGNOSIS — N5201 Erectile dysfunction due to arterial insufficiency: Secondary | ICD-10-CM

## 2017-01-18 DIAGNOSIS — I8393 Asymptomatic varicose veins of bilateral lower extremities: Secondary | ICD-10-CM

## 2017-01-18 DIAGNOSIS — Z952 Presence of prosthetic heart valve: Secondary | ICD-10-CM

## 2017-01-18 DIAGNOSIS — I420 Dilated cardiomyopathy: Secondary | ICD-10-CM

## 2017-01-18 NOTE — Telephone Encounter (Signed)
Spoke to [place] at [employer] office patient having trouble getting lab work done that was ordered by Dr.Jordan.Advised I will mail patient lab orders for lab to be done Tuesday 01/22/17 at [facility] at [facility] first floor.

## 2017-01-22 LAB — COMPREHENSIVE METABOLIC PANEL
ALBUMIN: 4.1 g/dL (ref 3.5–4.8)
ALT: 13 IU/L (ref 0–44)
AST: 22 IU/L (ref 0–40)
Albumin/Globulin Ratio: 2 (ref 1.2–2.2)
Alkaline Phosphatase: 55 IU/L (ref 39–117)
BILIRUBIN TOTAL: 0.4 mg/dL (ref 0.0–1.2)
BUN / CREAT RATIO: 14 (ref 10–24)
BUN: 15 mg/dL (ref 8–27)
CHLORIDE: 102 mmol/L (ref 96–106)
CO2: 31 mmol/L — ABNORMAL HIGH (ref 18–29)
Calcium: 9.6 mg/dL (ref 8.6–10.2)
Creatinine, Ser: 1.08 mg/dL (ref 0.76–1.27)
GFR, EST AFRICAN AMERICAN: 79 mL/min/{1.73_m2} (ref >59)
GFR, EST NON AFRICAN AMERICAN: 69 mL/min/{1.73_m2} (ref >59)
GLUCOSE: 97 mg/dL (ref 65–99)
Globulin, Total: 2.1 g/dL (ref 1.5–4.5)
Potassium: 4.6 mmol/L (ref 3.5–5.2)
Sodium: 138 mmol/L (ref 134–144)
TOTAL PROTEIN: 6.2 g/dL (ref 6.0–8.5)

## 2017-01-22 LAB — CBC WITH DIFFERENTIAL/PLATELET
Basophils Absolute: 0.1 10*3/uL (ref 0.0–0.2)
Basos: 1 %
EOS (ABSOLUTE): 0.1 10*3/uL (ref 0.0–0.4)
EOS: 2 %
HEMOGLOBIN: 13.6 g/dL (ref 13.0–17.7)
Hematocrit: 39.5 % (ref 37.5–51.0)
Lymphocytes Absolute: 1.3 10*3/uL (ref 0.7–3.1)
Lymphs: 20 %
MCH: 30.8 pg (ref 26.6–33.0)
MCHC: 34.4 g/dL (ref 31.5–35.7)
MCV: 90 fL (ref 79–97)
MONOCYTES: 11 %
MONOS ABS: 0.7 10*3/uL (ref 0.1–0.9)
NEUTROS PCT: 66 %
Neutrophils Absolute: 4.2 10*3/uL (ref 1.4–7.0)
Platelets: 211 10*3/uL (ref 150–379)
RBC: 4.41 x10E6/uL (ref 4.14–5.80)
RDW: 14.1 % (ref 12.3–15.4)
WBC: 6.4 10*3/uL (ref 3.4–10.8)

## 2017-01-23 ENCOUNTER — Telehealth: Payer: Self-pay | Admitting: Cardiology

## 2017-01-23 LAB — TSH: TSH: 5.39 u[IU]/mL — ABNORMAL HIGH (ref 0.450–4.500)

## 2017-01-23 LAB — LIPID PANEL
CHOL/HDL RATIO: 3.1 ratio (ref 0.0–5.0)
Cholesterol, Total: 199 mg/dL (ref 100–199)
HDL: 64 mg/dL (ref >39)
LDL CALC: 121 mg/dL — AB (ref 0–99)
TRIGLYCERIDES: 71 mg/dL (ref 0–149)
VLDL CHOLESTEROL CAL: 14 mg/dL (ref 5–40)

## 2017-01-23 LAB — PSA: Prostate Specific Ag, Serum: 1.3 ng/mL (ref 0.0–4.0)

## 2017-01-23 NOTE — Telephone Encounter (Signed)
Called the patient to find out what time of day he wants his chest CT done.  Left my name and number for him to call me back.

## 2017-01-28 ENCOUNTER — Telehealth: Payer: Self-pay | Admitting: Cardiology

## 2017-01-28 DIAGNOSIS — E039 Hypothyroidism, unspecified: Secondary | ICD-10-CM

## 2017-01-28 NOTE — Telephone Encounter (Signed)
New message     Please call pt between 4p-5p with test results he is at work

## 2017-01-28 NOTE — Telephone Encounter (Signed)
Notes Recorded by Peter M [place], MD on 01/23/2017 at 8:45 AM EDT LDL is mildly elevated. PSA is normal. TSH is mildly elevated suggesting he may be slightly hypothyroid. I would recommend repeat TSH with free T4 in 3 months.

## 2017-01-28 NOTE — Telephone Encounter (Signed)
Pt notified, lab orders entered for 6 months

## 2017-01-30 ENCOUNTER — Other Ambulatory Visit: Payer: Self-pay

## 2017-01-30 DIAGNOSIS — R7989 Other specified abnormal findings of blood chemistry: Secondary | ICD-10-CM

## 2017-01-31 ENCOUNTER — Encounter: Payer: Self-pay | Admitting: Cardiology

## 2017-02-01 ENCOUNTER — Other Ambulatory Visit: Payer: Self-pay

## 2017-02-01 ENCOUNTER — Ambulatory Visit (INDEPENDENT_AMBULATORY_CARE_PROVIDER_SITE_OTHER)
Admission: RE | Admit: 2017-02-01 | Discharge: 2017-02-01 | Disposition: A | Discharge status: Home / Self Care | Payer: Medicare Other | Source: Ambulatory Visit | Attending: Cardiology | Admitting: Cardiology

## 2017-02-01 DIAGNOSIS — I5020 Unspecified systolic (congestive) heart failure: Secondary | ICD-10-CM

## 2017-02-01 DIAGNOSIS — R938 Abnormal findings on diagnostic imaging of other specified body structures: Secondary | ICD-10-CM

## 2017-02-01 DIAGNOSIS — Z952 Presence of prosthetic heart valve: Secondary | ICD-10-CM

## 2017-02-01 DIAGNOSIS — I420 Dilated cardiomyopathy: Secondary | ICD-10-CM

## 2017-02-01 DIAGNOSIS — R9389 Abnormal findings on diagnostic imaging of other specified body structures: Secondary | ICD-10-CM

## 2017-02-01 MED ORDER — IOPAMIDOL (ISOVUE-300) INJECTION 61%: 100.0000 mL | Freq: Once | INTRAVENOUS | Status: DC | PRN

## 2017-02-04 ENCOUNTER — Other Ambulatory Visit: Payer: Self-pay

## 2017-02-04 DIAGNOSIS — I359 Nonrheumatic aortic valve disorder, unspecified: Secondary | ICD-10-CM

## 2017-02-04 DIAGNOSIS — I5020 Unspecified systolic (congestive) heart failure: Secondary | ICD-10-CM

## 2017-02-05 ENCOUNTER — Telehealth: Payer: Self-pay | Admitting: Cardiology

## 2017-02-05 NOTE — Telephone Encounter (Signed)
Got echo scheduled 02-08-17 at 11 a.m., at [facility].  LVM for patient to call me with any questions.

## 2017-02-08 ENCOUNTER — Ambulatory Visit (HOSPITAL_COMMUNITY)
Admission: RE | Admit: 2017-02-08 | Discharge: 2017-02-08 | Disposition: A | Discharge status: Home / Self Care | Payer: Medicare Other | Source: Ambulatory Visit | Attending: Cardiology | Admitting: Cardiology

## 2017-02-08 DIAGNOSIS — I359 Nonrheumatic aortic valve disorder, unspecified: Secondary | ICD-10-CM | POA: Insufficient documentation

## 2017-02-08 DIAGNOSIS — I5020 Unspecified systolic (congestive) heart failure: Secondary | ICD-10-CM | POA: Insufficient documentation

## 2017-02-08 DIAGNOSIS — Z952 Presence of prosthetic heart valve: Secondary | ICD-10-CM | POA: Insufficient documentation

## 2017-02-08 DIAGNOSIS — I517 Cardiomegaly: Secondary | ICD-10-CM | POA: Insufficient documentation

## 2017-02-08 NOTE — Progress Notes (Signed)
  Echocardiogram 2D Echocardiogram has been performed.  Jack Joseph 02/08/2017, 11:39 AM

## 2017-02-10 NOTE — Progress Notes (Signed)
Jack Joseph Date of Birth: November 01, 1946   History of Present Illness: Jack Joseph is seen today for followup of abnormal chest CT and Echo. 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 He is on chronic Coumadin therapy. He has a history of dilated cardiomyopathy with prior ejection fraction of 30-35%. He has consistently refused additional medical therapy in the past such as ACEi or beta blockers over the years.   When seen previously he noted a 13 lb weight loss since December. He thinks it was just because he isn't eating as much. Denies change in appetite or bowels. No bleeding. No chest pain. Some chronic dyspnea. Quit smoking 2002. To further evaluate his weigh loss labs were drawn. LDL was 121, TSH mildly elevated 5.39. CBC, CMET, PSA were normal. CXR showed a left hilar rounded mass and CT was recommended. 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 An Echo was also obtained showing normal functioning of his prosthetic AV. It confirmed the ascending aortic aneurysm. LV function was severely reduced with EF 25-30%. There was mild pulmonary HTN.   Current Outpatient Prescriptions on File Prior to Visit  Medication Sig Dispense Refill  . sildenafil (REVATIO) 20 MG tablet TAKE 1-5 TABLETS BY MOUTH AS NEEDED FOR SEXUAL ACTIVITY 50 tablet 1   No current facility-administered medications on file prior to visit.     No Known Allergies  Past Medical History:  Diagnosis Date  . AVD (aortic valve disease)   . Chronic anticoagulation   . Dilated cardiomyopathy (HCC)    EF 30-35%  . Hyperlipidemia   . LV dysfunction     Past Surgical History:  Procedure Laterality Date  . AORTIC VALVE REPLACEMENT    . DOPPLER ECHOCARDIOGRAPHY  04/07/2002   EF 30-35%    History  Smoking Status  . Former Smoker  . Quit date: 11/12/2000  Smokeless Tobacco  . Never  Used    History  Alcohol Use No    Family History  Problem Relation Age of Onset  . Heart disease Brother     Review of Systems: As noted in history of present illness.  All other systems were reviewed and are negative.  Physical Exam: BP 112/63   Pulse 94   Ht 5\' 10"  (1.778 m)   Wt 146 lb (66.2 kg)   BMI 20.95 kg/m  He is a pleasant thin white male in no acute distress. Appears much thinner than before. HEENT exam is unremarkable. Neck is supple without JVD, adenopathy, thyromegaly, or bruits. Lungs reveal coarse wheezes in right lung. Cardiac exam reveals a good mechanical aortic valve click without murmur or S3. Abdomen soft and nontender without masses. He does have a midline abdominal bruit.  He has no lower extremity edema but does have large superficial varicosities in the lower extremities. Skin is warm and dry. His neurologic exam is nonfocal.  LABORATORY DATA:  Lab Results  Component Value Date   WBC 6.4 01/22/2017   HCT 39.5 01/22/2017   PLT 211 01/22/2017   GLUCOSE 97 01/22/2017   CHOL 199 01/22/2017   TRIG 71 01/22/2017   HDL 64 01/22/2017   LDLCALC 121 (H) 01/22/2017   ALT 13 01/22/2017   AST 22 01/22/2017   NA 138 01/22/2017   K 4.6 01/22/2017   CL 102 01/22/2017   CREATININE 1.08 01/22/2017   BUN  15 01/22/2017   CO2 31 (H) 01/22/2017   TSH 5.390 (H) 01/22/2017   INR 3.3 01/07/2017    CHEST  2 VIEW  COMPARISON:  None.  FINDINGS: No active infiltrate or effusion is seen. The lungs are perhaps slightly hyperaerated. There is a nodular opacity overlying the left medial lung adjacent to the hilum, measuring 24 mm in diameter. This is very rounded and could possibly represent aneurysm possibly of the left pulmonary artery versus mass or adenopathy. CT the chest with IV contrast media is recommended. The heart is within upper limits of normal. The descending thoracic aorta is ectatic. Median sternotomy sutures are noted from prior aortic valve  replacement. No acute bony abnormality is seen.  IMPRESSION: Rounded opacity overlying the left medial lung near the hilum could be vascular in origin, i.e. aneurysm possibly from the pulmonary artery although mass or adenopathy cannot be excluded. Recommend CT of the chest with IV contrast media.   Electronically Signed   By: Dwyane Dee M.D.   On: 01/09/2017 16:27  CT ANGIOGRAPHY CHEST WITH CONTRAST  TECHNIQUE: Multidetector CT imaging of the chest was performed using the standard protocol during bolus administration of intravenous contrast. Multiplanar CT image reconstructions and MIPs were obtained to evaluate the vascular anatomy.  CONTRAST:  100 cc Isovue 300  COMPARISON:  Chest x-ray dated 01/09/2017.  FINDINGS: Cardiovascular: Prominent aneurysm of the ascending thoracic aorta, measuring 6.3 cm diameter.  Thoracic aortic arch and descending thoracic aorta are normal in caliber. Mild atherosclerotic change.  Heart size is normal. No pericardial effusion. Diffuse coronary artery calcifications.  Mediastinum/Nodes: No mass or enlarged lymph nodes within the mediastinum. Esophagus appears normal. Trachea and central bronchi are unremarkable.  Lungs/Pleura: Mixed density mass, with internal fat density, is seen within the left upper lobe, suprahilar, measuring 3.2 x 2.7 x 2.3 cm, corresponding to the finding on recent chest x-ray.  Mild scarring/atelectasis at each lung base.  No pleural effusion.  Upper Abdomen: No acute abnormality.  Musculoskeletal: Mild degenerative spurring throughout the kyphotic thoracolumbar spine. Median sternotomy wires in place. No acute or suspicious osseous finding.  Review of the MIP images confirms the above findings.  IMPRESSION: 1. Circumscribed mixed density mass, with partial fat density, located within the central aspects of the left upper lobe, suprahilar, measuring 3.2 cm greatest dimension,  corresponding to the finding on recent chest x-ray. 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 As there are no prior studies for comparison, recommend follow-up chest CT in 3-6 months to ensure stability. 2. Ascending thoracic aortic aneurysm measuring 6.3 cm diameter. Recommend semi-annual imaging followup by CTA or MRA and referral to cardiothoracic surgery if not already obtained. This recommendation follows 2010 ACCF/AHA/AATS/ACR/ASA/SCA/SCAI/SIR/STS/SVM Guidelines for the Diagnosis and Management of Patients With Thoracic Aortic Disease. Circulation. 2010; 121: e266-e369TAA. Aortic valve hardware in place. 3. Heart size is normal.  Diffuse coronary artery calcifications. 4. Aortic atherosclerosis.   Electronically Signed   By: Bary Richard M.D.   On: 02/01/2017 16:36  Echo 02/08/17: Study Conclusions  - Left ventricle: The cavity size was mildly dilated. Wall   thickness was normal. Systolic function was severely reduced. The   estimated ejection fraction was in the range of 25% to 30%.   Diffuse hypokinesis. Doppler parameters are consistent with   abnormal left ventricular relaxation (grade 1 diastolic   dysfunction). - Aortic valve: Mechanical aortic valve, well seated Vickki Hearing). Peak   velocity (S): 142 cm/s. - Aorta: CT scan report 6.3cm  aneursym of thoracic aorta. Aortic   root dimension: 42 mm (ED) at the sinus of Valsalva and 6.65cm   ascending root. - Ascending aorta: The ascending aorta was severely dilated. - Left atrium: The atrium was mildly dilated. Volume/bsa, S: 40.8   ml/m^2. - Pulmonary arteries: Systolic pressure was mildly increased. PA   peak pressure: 38 mm Hg (S).  Recommendations:  This procedure has been discussed with the referring physician.  Assessment / Plan: 1. 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 He is on chronic Coumadin therapy.  Good click. Good valve function by Echo. Continue  current therapy.  2. Dilated cardiomyopathy. Ejection fraction of 25-30%. This is consistent with prior evaluation. Patient is class 1-2. We have thoroughly discussed additional medical therapy and will initiate Coreg 3.125 mg bid. If tolerated will add an ACEi. BP generally low so will need to titrate cautiously.   3. Thoracic aortic aneurysm. Ascending aorta. 6.3 cm.  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 Recommend consideration for Aortic root grafting. Patient is reluctant to pursue and not sure he wants to pursue. I have recommended he at least be evaluated by CT surgery so he can be fully educated on his condition. If he is willing to consider surgery we will arrange a [facility].   4. Weight loss. Patient has actually gained 4 lbs in last month.  5. Abdominal bruit. On exam aorta does not feel aneurysmal. May need to consider CT or [place] evaluation of abdominal aorta. Will await CT surgery input.

## 2017-02-11 ENCOUNTER — Encounter: Payer: Self-pay | Admitting: Cardiology

## 2017-02-11 ENCOUNTER — Ambulatory Visit (INDEPENDENT_AMBULATORY_CARE_PROVIDER_SITE_OTHER): Payer: Medicare Other | Admitting: Cardiology

## 2017-02-11 ENCOUNTER — Other Ambulatory Visit: Payer: Self-pay | Admitting: Cardiology

## 2017-02-11 VITALS — BP 112/63 | HR 94 | Ht 70.0 in | Wt 146.0 lb

## 2017-02-11 DIAGNOSIS — I712 Thoracic aortic aneurysm, without rupture, unspecified: Secondary | ICD-10-CM

## 2017-02-11 MED ORDER — WARFARIN SODIUM 5 MG PO TABS: ORAL_TABLET | ORAL | 1 refills | Status: DC

## 2017-02-11 MED ORDER — CARVEDILOL 3.125 MG PO TABS: 3.1250 mg | ORAL_TABLET | Freq: Two times a day (BID) | ORAL | 3 refills | Status: DC

## 2017-02-11 NOTE — Patient Instructions (Addendum)
Start Coreg 3.125 mg twice a day  We will refer you to CT surgery. If you agree to having surgery we will arrange for a cardiac cath

## 2017-02-13 ENCOUNTER — Encounter: Payer: Medicare Other | Admitting: Cardiothoracic Surgery

## 2017-02-13 ENCOUNTER — Other Ambulatory Visit: Payer: Self-pay | Admitting: *Deleted

## 2017-02-13 DIAGNOSIS — R918 Other nonspecific abnormal finding of lung field: Secondary | ICD-10-CM

## 2017-02-15 ENCOUNTER — Ambulatory Visit (INDEPENDENT_AMBULATORY_CARE_PROVIDER_SITE_OTHER): Payer: Medicare Other

## 2017-02-15 DIAGNOSIS — Z7901 Long term (current) use of anticoagulants: Secondary | ICD-10-CM

## 2017-02-15 DIAGNOSIS — Z952 Presence of prosthetic heart valve: Secondary | ICD-10-CM | POA: Diagnosis not present

## 2017-02-15 LAB — POCT INR: INR: 3.3

## 2017-02-21 ENCOUNTER — Institutional Professional Consult (permissible substitution) (INDEPENDENT_AMBULATORY_CARE_PROVIDER_SITE_OTHER): Payer: Medicare Other | Admitting: Cardiothoracic Surgery

## 2017-02-21 ENCOUNTER — Encounter: Payer: Self-pay | Admitting: Cardiothoracic Surgery

## 2017-02-21 ENCOUNTER — Encounter (HOSPITAL_COMMUNITY)
Admission: RE | Admit: 2017-02-21 | Discharge: 2017-02-21 | Disposition: A | Discharge status: Home / Self Care | Payer: Medicare Other | Source: Ambulatory Visit | Attending: Cardiothoracic Surgery | Admitting: Cardiothoracic Surgery

## 2017-02-21 VITALS — BP 106/64 | HR 75 | Resp 20 | Ht 69.0 in | Wt 146.0 lb

## 2017-02-21 DIAGNOSIS — R918 Other nonspecific abnormal finding of lung field: Secondary | ICD-10-CM | POA: Insufficient documentation

## 2017-02-21 DIAGNOSIS — I712 Thoracic aortic aneurysm, without rupture, unspecified: Secondary | ICD-10-CM

## 2017-02-21 DIAGNOSIS — I5022 Chronic systolic (congestive) heart failure: Secondary | ICD-10-CM | POA: Diagnosis not present

## 2017-02-21 LAB — GLUCOSE, CAPILLARY: Glucose-Capillary: 105 mg/dL — ABNORMAL HIGH (ref 65–99)

## 2017-02-21 MED ORDER — FLUDEOXYGLUCOSE F - 18 (FDG) INJECTION
7.4000 | Freq: Once | INTRAVENOUS | Status: AC | PRN
  Administered 2017-02-21: 7.4 via INTRAVENOUS

## 2017-02-21 NOTE — Progress Notes (Signed)
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 male is seen in the office  today for dilated ascending aorta. Patient had replacement of his aortic valve with a  with  27 mm Bjork-Shiley tilting disk valve  27 [id number] in 1983 for Aortic Insufficiency, probability a bicuspid valve. In 2003 echocardiogram noted LV dysfunction 30-35 % and 39 mm aorta just above the cusps and 58 mm at mid ascending aorta. 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 He has been using crutches to walk around .  He has no known history of MI., no recent cath   I have contacted the Jack Joseph recall  Administrator  and confirmed the serial number of the patients valve is not included in the [facility] CC valve recall in the 1980's. Surgery done by Dr Jack Joseph [facility]    Current Activity/ Functional Status:  Patient is independent with mobility/ambulation, transfers, ADL's, IADL's.   Zubrod Score: At the time of surgery this patient's most appropriate activity status/level should be described as: []     0    Normal activity, no symptoms [x]     1    Restricted in physical strenuous activity but ambulatory, able to do out light work, patient has difficulty walking using crutches  []     2    Ambulatory and capable of self care, unable to do work activities, up and about               >50 % of waking hours                              []     3     Only limited self care, in bed greater than 50% of waking hours []     4    Completely disabled, no self care, confined to bed or chair []     5    Moribund   Past Medical History:  Diagnosis Date  . AVD (aortic valve disease)   . Chronic anticoagulation   . Dilated cardiomyopathy (HCC)    EF 30-35%  . Hyperlipidemia   . LV dysfunction     Past Surgical History:  Procedure Laterality Date  . AORTIC VALVE REPLACEMENT    . DOPPLER ECHOCARDIOGRAPHY  04/07/2002   EF 30-35%    Family History  Problem Relation Age of Onset  . Heart disease Brother     Social History   Social History  . Marital status: Married    Spouse name: N/A  . Number of children: N/A  . Years of education: N/A   Occupational History  . Not on file.   Social History Main Topics  . Smoking status: Former Smoker  Quit date: 11/12/2000  . Smokeless tobacco: Never Used  . Alcohol use No  . Drug use: No  . Sexual activity: Yes   Other Topics Concern  . Not on file   Social History Narrative  . No narrative on file    History  Smoking Status  . Former Smoker  . Quit date: 11/12/2000  Smokeless Tobacco  . Never Used    History  Alcohol Use No     No Known Allergies  Current Outpatient Prescriptions  Medication Sig Dispense Refill  . carvedilol (COREG) 3.125 MG tablet Take 1 tablet (3.125 mg total) by mouth 2 (two) times daily. 180 tablet 3  . sildenafil (REVATIO) 20 MG tablet TAKE 1-5 TABLETS BY MOUTH AS NEEDED FOR SEXUAL ACTIVITY 50 tablet 1  . warfarin (COUMADIN) 5 MG tablet TAKE ONE TABLET BY MOUTH ONCE DAILY OR  AS  DIRECTED  BY  COUMADIN  CLINIC 90 tablet 1  . warfarin (COUMADIN) 5 MG tablet Take 1 tablet by mouth daily or as directed by Coumadin Clinic 90 tablet 1   No current facility-administered medications for this visit.      Review of Systems:     Cardiac Review of Systems: Y or N  Chest Pain [  n  ]  Resting SOB [ y  ] Exertional SOB  Cove.Etienne  ]  Orthopnea [ y ]   Pedal  Edema Cove.Etienne   ]    Palpitations Milo.Brash  ] Syncope  Milo.Brash  ]   Presyncope [ n  ]  General Review of Systems: [Y] = yes [  ]=no Constitional: recent weight change [ n ];  Wt loss over the last 3 months [   ] anorexia [  ]; fatigue Cove.Etienne  ]; nausea [  ]; night sweats [  ]; fever [  ]; or chills [  ];          Dental: poor dentition[  ]; Last Dentist visit:   Eye : blurred vision [  ]; diplopia [   ]; vision changes [  ];  Amaurosis fugax[  ]; Resp: cough [ n ];  wheezing[ n ];  hemoptysis[n  ]; shortness of breath[y  ]; paroxysmal nocturnal dyspnea[y  ]; dyspnea on exertion[y  ]; or orthopnea[ y ];  GI:  gallstones[  ], vomiting[  ];  dysphagia[  ]; melena[  ];  hematochezia [  ]; heartburn[  ];   Hx of  Colonoscopy[  ]; GU: kidney stones [  ]; hematuria[  ];   dysuria [  ];  nocturia[  ];  history of     obstruction [  ]; urinary frequency [  ]             Skin: rash, swelling[  ];, hair loss[  ];  peripheral edema[y  ];  or itching[  ]; Musculosketetal: myalgias[ y ];  joint swelling[y  ];  joint erythema[  ];  joint pain[  ];  back pain[  ];  Heme/Lymph: bruising[  ];  bleeding[  ];  anemia[  ];  Neuro: TIA[  ];  headaches[  ];  stroke[  ];  vertigo[  ];  seizures[n  ];   paresthesias[  ];  difficulty walking[y  ];  Psych:depression[  ]; anxiety[  ];  Endocrine: diabetes[  ];  thyroid dysfunction[  ];  Immunizations: Flu up to date Milo.Brash  ]; Pneumococcal up to date Milo.Brash  ];  Other:  Physical Exam: BP  106/64   Pulse 75   Resp 20   Ht 5\' 9"  (1.753 m)   Wt 146 lb (66.2 kg)   SpO2 94%   BMI 21.56 kg/m   PHYSICAL EXAMINATION: General appearance: alert, cooperative, appears older than stated age and no distress Head: Normocephalic, without obvious abnormality, atraumatic Neck: no adenopathy, no carotid bruit, no JVD, supple, symmetrical, trachea midline and thyroid not enlarged, symmetric, no tenderness/mass/nodules Lymph nodes: Cervical, supraclavicular, and axillary nodes normal. Resp: diminished breath  sounds bibasilar Back: symmetric, no curvature. ROM normal. No CVA tenderness. Cardio: regular rate and rhythm, ejection click present and no M of AI GI: soft, non-tender; bowel sounds normal; no masses,  no organomegaly Extremities: edema 2-3+ right greater then left, Homans sign is negative, no sign of DVT, varicose veins noted and venous stasis dermatitis noted Neurologic: Grossly normal Sternum stable and healed   Diagnostic Studies & Laboratory data:     Recent Radiology Findings:   Nm Pet Image Initial (pi) Skull Base To Thigh  Result Date: 02/21/2017 CLINICAL DATA:  Initial treatment strategy for left upper lobe mass. EXAM: NUCLEAR MEDICINE PET SKULL BASE TO THIGH TECHNIQUE: 7.4 mCi F-18 FDG was injected intravenously. Full-ring PET imaging was performed from the skull base to thigh after the radiotracer. CT data was obtained and used for attenuation correction and anatomic localization. FASTING BLOOD GLUCOSE:  Value: 110 mg/dl COMPARISON:  16/08/9603 chest CT FINDINGS: NECK No hypermetabolic lymph nodes in the neck. Carotid atherosclerotic calcifications. Physiologic symmetric activity in the lower neck strap musculature. CHEST The primarily fatty 2.7 by 2.3 cm left upper lobe suprahilar nodule has a maximum SUV of 2.2. Background mediastinal blood pool activity is 2.2. This nodule is occluding various left upper lobe tracheobronchial branches with the exception of the lingular bronchi. Small mediastinal lymph nodes are not hypermetabolic. Mild airway thickening. Possible small broncholiths in the left lower lobe along with some peripheral airway plugging, unchanged from prior. Ascending thoracic aortic aneurysm. Aortic valve prosthesis. Coronary, aortic arch, and branch vessel atherosclerotic vascular disease. ABDOMEN/PELVIS No abnormal hypermetabolic activity within the liver, pancreas, adrenal glands, or spleen. No hypermetabolic lymph nodes in the abdomen or pelvis. Left scrotal hydrocele.  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 Calcifications in the renal hilum bilaterally are thought to be vascular. Prominent stool throughout the colon favors constipation. SKELETON No focal hypermetabolic activity to suggest skeletal metastasis. Thoracolumbar spondylosis. IMPRESSION: 1. Primarily fatty 2.7 by 2.3 cm left upper lobe suprahilar nodule is not hypermetabolic. 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 However, this does appear to be obstructing several segmental left upper lobe bronchi. 2. Mild airway thickening. Chronic peripheral airway plugging in parts of the left lower lobe possibly with some small broncholiths. 3. Stable appearance of ascending thoracic aortic aneurysm with aortic valve prosthesis. 4. Other imaging findings of potential clinical significance: Coronary, aortic arch, and branch vessel atherosclerotic vascular disease. Aortoiliac atherosclerotic vascular disease. Prominent stool throughout the colon favors constipation. Left scrotal hydrocele. 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 Electronically Signed   By: Gaylyn Rong M.D.   On: 02/21/2017 08:53   Ct Angio Chest Aorta W &/or Wo Contrast  Result Date: 02/01/2017 CLINICAL DATA:  Abnormal chest x-ray on 02/28. Former smoker. History of CHF. EXAM: CT ANGIOGRAPHY CHEST WITH CONTRAST TECHNIQUE: Multidetector CT imaging of the chest was performed using the standard protocol during bolus administration of intravenous contrast. Multiplanar CT image reconstructions and MIPs were obtained to evaluate the vascular  anatomy. CONTRAST:  100 cc Isovue 300 COMPARISON:  Chest x-ray dated 01/09/2017. FINDINGS: Cardiovascular: Prominent aneurysm of the ascending thoracic aorta, measuring 6.3 cm diameter. Thoracic aortic arch and descending thoracic aorta are normal in caliber. Mild atherosclerotic change. Heart size is normal. No pericardial effusion.  Diffuse coronary artery calcifications. Mediastinum/Nodes: No mass or enlarged lymph nodes within the mediastinum. Esophagus appears normal. Trachea and central bronchi are unremarkable. Lungs/Pleura: Mixed density mass, with internal fat density, is seen within the left upper lobe, suprahilar, measuring 3.2 x 2.7 x 2.3 cm, corresponding to the finding on recent chest x-ray. Mild scarring/atelectasis at each lung base.  No pleural effusion. Upper Abdomen: No acute abnormality. Musculoskeletal: Mild degenerative spurring throughout the kyphotic thoracolumbar spine. Median sternotomy wires in place. No acute or suspicious osseous finding. Review of the MIP images confirms the above findings. IMPRESSION: 1. Circumscribed mixed density mass, with partial fat density, located within the central aspects of the left upper lobe, suprahilar, measuring 3.2 cm greatest dimension, corresponding to the finding on recent chest x-ray. 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 As there are no prior studies for comparison, recommend follow-up chest CT in 3-6 months to ensure stability. 2. Ascending thoracic aortic aneurysm measuring 6.3 cm diameter. Recommend semi-annual imaging followup by CTA or MRA and referral to cardiothoracic surgery if not already obtained. This recommendation follows 2010 ACCF/AHA/AATS/ACR/ASA/SCA/SCAI/SIR/STS/SVM Guidelines for the Diagnosis and Management of Patients With Thoracic Aortic Disease. Circulation. 2010; 121: e266-e369TAA. Aortic valve hardware in place. 3. Heart size is normal.  Diffuse coronary artery calcifications. 4. Aortic atherosclerosis. Electronically Signed   By: Bary Richard M.D.   On: 02/01/2017 16:36     I have independently reviewed the above radiologic studies.  Recent Lab Findings: Lab Results  Component Value Date   WBC 6.4 01/22/2017   HCT 39.5 01/22/2017   PLT 211 01/22/2017   GLUCOSE 97 01/22/2017   CHOL 199 01/22/2017   TRIG 71 01/22/2017   HDL 64  01/22/2017   LDLCALC 121 (H) 01/22/2017   ALT 13 01/22/2017   AST 22 01/22/2017   NA 138 01/22/2017   K 4.6 01/22/2017   CL 102 01/22/2017   CREATININE 1.08 01/22/2017   BUN 15 01/22/2017   CO2 31 (H) 01/22/2017   TSH 5.390 (H) 01/22/2017   INR 3.3 02/15/2017   Echo: *[place]*                  *[facility]*                          [address]                        [place], [place] [address]                            [phone]  ------------------------------------------------------------------- Echocardiography  Patient:    Hale, Chalfin MR #:       [id number] Study Date: 02/08/2017 Gender:     M Age:        56 Height:     177.8 cm Weight:     64.8 kg BSA:        1.78 m^2 Pt. Status: Room:   ATTENDING    Jack Joseph, M.D.  ORDERING     Jack Joseph, M.D.  REFERRING    Theron Arista  Joseph, M.D.  SONOGRAPHER  Nolon Rod, RDCS  PERFORMING   [facility], Outpatient  cc:  ------------------------------------------------------------------- LV EF: 25% -   30%  ------------------------------------------------------------------- Indications:      Aortic stenosis 424.1.  ------------------------------------------------------------------- History:   PMH:   Aortic valve disease.  Risk factors:  Former tobacco use.  ------------------------------------------------------------------- Study Conclusions  - Left ventricle: The cavity size was mildly dilated. Wall   thickness was normal. Systolic function was severely reduced. The   estimated ejection fraction was in the range of 25% to 30%.   Diffuse hypokinesis. Doppler parameters are consistent with   abnormal left ventricular relaxation (grade 1 diastolic   dysfunction). - Aortic valve: Mechanical aortic valve, well seated Vickki Hearing). Peak   velocity (S): 142 cm/s. - Aorta: CT scan report 6.3cm aneursym of thoracic aorta. Aortic   root dimension: 42 mm (ED) at the sinus of Valsalva and  6.65cm   ascending root. - Ascending aorta: The ascending aorta was severely dilated. - Left atrium: The atrium was mildly dilated. Volume/bsa, S: 40.8   ml/m^2. - Pulmonary arteries: Systolic pressure was mildly increased. Joseph   peak pressure: 38 mm Hg (S).  Recommendations:  This procedure has been discussed with the referring physician.  ------------------------------------------------------------------- Labs, prior tests, procedures, and surgery: Aortic valve replacement with a mechanical valve.  ------------------------------------------------------------------- Study data:   Study status:  Routine.  Study completion:  There were no complications.          Echocardiography.  M-mode, complete 2D, spectral Doppler, and color Doppler.  Birthdate:  Patient birthdate: July 14, 1946.  Age:  Patient is 71 yr old.  Sex:  Gender: male.    BMI: 20.5 kg/m^2.  Blood pressure:     106/60  Patient status:  Outpatient.  Study date:  Study date: 02/08/2017. Study time: 10:50 AM.  Location:  Echo laboratory.  -------------------------------------------------------------------  ------------------------------------------------------------------- Left ventricle:  The cavity size was mildly dilated. Wall thickness was normal. Systolic function was severely reduced. The estimated ejection fraction was in the range of 25% to 30%. Diffuse hypokinesis. Doppler parameters are consistent with abnormal left ventricular relaxation (grade 1 diastolic dysfunction).  ------------------------------------------------------------------- Aortic valve:  Mechanical aortic valve, well seated Vickki Hearing). Doppler:  Transvalvular velocity was within the normal range. There was no stenosis. There was no regurgitation.    VTI ratio of LVOT to aortic valve: 1.02. Peak velocity ratio of LVOT to aortic valve: 0.97. Mean velocity ratio of LVOT to aortic valve: 1.03.    Mean gradient (S): 5 mm Hg. Peak gradient (S): 8 mm  Hg.  ------------------------------------------------------------------- Aorta:  Ascending aorta: The ascending aorta was severely dilated.  ------------------------------------------------------------------- Mitral valve:   Structurally normal valve.   Leaflet separation was normal.  Doppler:  Transvalvular velocity was within the normal range. There was no evidence for stenosis. There was no regurgitation.  ------------------------------------------------------------------- Left atrium:  The atrium was mildly dilated.  ------------------------------------------------------------------- Right ventricle:  The cavity size was normal. Wall thickness was normal. Systolic function was normal.  ------------------------------------------------------------------- Pulmonic valve:   Poorly visualized.  Structurally normal valve. Cusp separation was normal.  Doppler:  Transvalvular velocity was within the normal range. There was trivial regurgitation.  ------------------------------------------------------------------- Tricuspid valve:   Structurally normal valve.   Leaflet separation was normal.  Doppler:  Transvalvular velocity was within the normal range. There was mild regurgitation.  ------------------------------------------------------------------- Pulmonary artery:   The main pulmonary artery was normal-sized. Systolic pressure was mildly increased.  ------------------------------------------------------------------- Right atrium:  The atrium was normal  in size.  ------------------------------------------------------------------- Pericardium:  The pericardium was normal in appearance.  ------------------------------------------------------------------- Systemic veins: Inferior vena cava: The vessel was dilated. The respirophasic diameter changes were blunted (< 50%), consistent with elevated central venous  pressure.  ------------------------------------------------------------------- Measurements   Left ventricle                          Value        Reference  LV ID, ED, PLAX chordal          (H)    63.5  mm     43 - 52  LV ID, ES, PLAX chordal          (H)    58    mm     23 - 38  LV fx shortening, PLAX chordal   (L)    9     %      >=29  LV PW thickness, ED                     8.48  mm     ----------  IVS/LV PW ratio, ED                     0.94         <=1.3  LV ejection fraction, 1-p A4C           44    %      ----------  LV end-diastolic volume, 2-p            199   ml     ----------  LV end-systolic volume, 2-p             119   ml     ----------  LV ejection fraction, 2-p               40    %      ----------  Stroke volume, 2-p                      80    ml     ----------  LV end-diastolic volume/bsa, 2-p        112   ml/m^2 ----------  LV end-systolic volume/bsa, 2-p         67    ml/m^2 ----------  Stroke volume/bsa, 2-p                  44.9  ml/m^2 ----------  LV e&', lateral                          8.92  cm/s   ----------  LV E/e&', lateral                        7.78         ----------  LV e&', medial                           5     cm/s   ----------  LV E/e&', medial                         13.88        ----------  LV e&', average  6.96  cm/s   ----------  LV E/e&', average                        9.97         ----------  Longitudinal strain, TDI                17    %      ----------    Ventricular septum                      Value        Reference  IVS thickness, ED                       7.94  mm     ----------    LVOT                                    Value        Reference  LVOT peak velocity, S                   138   cm/s   ----------  LVOT mean velocity, S                   107   cm/s   ----------  LVOT VTI, S                             29.2  cm     ----------  LVOT peak gradient, S                   8     mm Hg  ----------     Aortic valve                            Value        Reference  Aortic valve peak velocity, S           142   cm/s   ----------  Aortic valve mean velocity, S           104   cm/s   ----------  Aortic valve VTI, S                     28.5  cm     ----------  Aortic mean gradient, S                 5     mm Hg  ----------  Aortic peak gradient, S                 8     mm Hg  ----------  VTI ratio, LVOT/AV                      1.02         ----------  Velocity ratio, peak, LVOT/AV           0.97         ----------  Velocity ratio, mean, LVOT/AV           1.03         ----------    Aorta  Value        Reference  Aortic root ID, ED                      42    mm     ----------    Left atrium                             Value        Reference  LA ID, A-P, ES                          38    mm     ----------  LA ID/bsa, A-P                          2.13  cm/m^2 <=2.2  LA volume, S                            72.7  ml     ----------  LA volume/bsa, S                        40.8  ml/m^2 ----------  LA volume, ES, 1-p A4C                  59.2  ml     ----------  LA volume/bsa, ES, 1-p A4C              33.2  ml/m^2 ----------  LA volume, ES, 1-p A2C                  85.8  ml     ----------  LA volume/bsa, ES, 1-p A2C              48.1  ml/m^2 ----------    Mitral valve                            Value        Reference  Mitral E-wave peak velocity             69.4  cm/s   ----------  Mitral A-wave peak velocity             68.4  cm/s   ----------  Mitral deceleration time                201   ms     150 - 230  Mitral E/A ratio, peak                  1            ----------  Mitral regurg VTI, PISA                 188   cm     ----------    Pulmonary arteries                      Value        Reference  Joseph pressure, S, DP               (H)    38    mm Hg  <=30    Tricuspid valve  Value        Reference  Tricuspid regurg peak velocity           238   cm/s   ----------  Tricuspid peak RV-RA gradient           23    mm Hg  ----------    Right atrium                            Value        Reference  RA ID, S-I, ES, A4C              (H)    52    mm     34 - 49  RA area, ES, A4C                        18.2  cm^2   8.3 - 19.5  RA volume, ES, A/L                      53    ml     ----------  RA volume/bsa, ES, A/L                  29.7  ml/m^2 ----------    Systemic veins                          Value        Reference  Estimated CVP                           15    mm Hg  ----------    Right ventricle                         Value        Reference  RV pressure, S, DP               (H)    38    mm Hg  <=30  RV s&', lateral, S                       9.14  cm/s   ----------  Legend: (L)  and  (H)  mark values outside specified reference range.  ------------------------------------------------------------------- Prepared and Electronically Authenticated by  Donato Schultz, M.D. 2018-03-30T12:37:39   Aortic Size Index=   6.3      /Body surface area is 1.8 meters squared. = 3.5  < 2.75 cm/m2      4% risk per year 2.75 to 4.25          8% risk per year > 4.25 cm/m2    20% risk per year  Wt Readings from Last 3 Encounters:  02/21/17 146 lb (66.2 kg)  02/11/17 146 lb (66.2 kg)  01/07/17 142 lb 12.8 oz (64.8 kg)     Assessment / Plan:   1/ Systolic function was severely reduced. The estimated ejection fraction was in the range of 25% to 30%. Stage C, Class III     Diffuse hypokinesis. Doppler parameters indicate  abnormal left ventricular relaxation (grade 1 diastolic dysfunction). 2/ Dilated ascending aorta  To 6.3 cm after replacement of the  Aortic valve with  27 mm Bjork-Shiley tilting disk valve  27 [id number] not recalled   3/  Primarily fatty 2.7 by 2.3 cm left upper lobe suprahilar nodule is not hypermetabolic,  compatible with hamartoma, but its central location may be effecting respiratory function as it does appear to be  s appear to be      obstructing several segmental left upper lobe bronchi    I have discussed with the patient the risk of a dilated ascending aorta dissecting. He currently has significant degree of Heart Failure. The left lung mass does not appear to be malignant but its central location is likely worsening symptoms of SOB in addition to longstanding cardiomyopathy.   Patient is wiling l to have cardiac cath, He will return to see Dr Joseph for right and left heart cath, fluor to measure opening angles of the mechanical valve and will need aggressive medical treatment for heart failure before considering redo cardiac surgery and replacement of aortic root and ascending aorta.   I have contacted the [facility] recall  Administrator  and confirmed the serial number of the patients valve is not included in the [facility] CC valve recall in the 1980's   I  spent 40 minutes counseling the patient face to face and 50% or more the  time was spent in counseling and coordination of care. The total time spent in the appointment was 60 minutes.  [name] MD      [address] [place] [place] [address] Office [phone]   Beeper [phone]  02/25/2017 10:02 PM

## 2017-02-22 ENCOUNTER — Encounter: Payer: Medicare Other | Admitting: Cardiothoracic Surgery

## 2017-02-25 ENCOUNTER — Other Ambulatory Visit: Payer: Self-pay | Admitting: Cardiology

## 2017-02-25 ENCOUNTER — Telehealth: Payer: Self-pay

## 2017-02-25 DIAGNOSIS — I712 Thoracic aortic aneurysm, without rupture, unspecified: Secondary | ICD-10-CM | POA: Insufficient documentation

## 2017-02-25 DIAGNOSIS — R918 Other nonspecific abnormal finding of lung field: Secondary | ICD-10-CM | POA: Insufficient documentation

## 2017-02-25 DIAGNOSIS — I359 Nonrheumatic aortic valve disorder, unspecified: Secondary | ICD-10-CM

## 2017-02-25 DIAGNOSIS — I420 Dilated cardiomyopathy: Secondary | ICD-10-CM

## 2017-02-25 DIAGNOSIS — I5023 Acute on chronic systolic (congestive) heart failure: Secondary | ICD-10-CM | POA: Insufficient documentation

## 2017-02-25 NOTE — Telephone Encounter (Signed)
Spoke to wife advised to have husband come to office tomorrow to see me to  review cath instructions for right and left cardiac cath scheduled for Fri 03/01/17.Advised to have patient start holding coumadain today.

## 2017-02-26 ENCOUNTER — Telehealth: Payer: Self-pay

## 2017-02-26 NOTE — Telephone Encounter (Signed)
Patient came to office to pick up cath instructions,when I went to lobby to call his name he had left.I tried calling patient on cell # [phone], but he did not answer.I left message for patient to come back today to pick up cath instructions and he will need pre cath lab work.I called wife and left message on her personal voice mail patient will need to come back to office today to pick up cath instructions.

## 2017-02-27 ENCOUNTER — Other Ambulatory Visit: Payer: Self-pay | Admitting: *Deleted

## 2017-02-27 DIAGNOSIS — Z01812 Encounter for preprocedural laboratory examination: Secondary | ICD-10-CM

## 2017-02-27 DIAGNOSIS — I359 Nonrheumatic aortic valve disorder, unspecified: Secondary | ICD-10-CM

## 2017-02-27 DIAGNOSIS — I420 Dilated cardiomyopathy: Secondary | ICD-10-CM

## 2017-02-28 LAB — BASIC METABOLIC PANEL
BUN/Creatinine Ratio: 13 (ref 10–24)
BUN: 15 mg/dL (ref 8–27)
CO2: 31 mmol/L — AB (ref 18–29)
CREATININE: 1.13 mg/dL (ref 0.76–1.27)
Calcium: 9.3 mg/dL (ref 8.6–10.2)
Chloride: 97 mmol/L (ref 96–106)
GFR calc Af Amer: 75 mL/min/{1.73_m2} (ref >59)
GFR calc non Af Amer: 65 mL/min/{1.73_m2} (ref >59)
GLUCOSE: 133 mg/dL — AB (ref 65–99)
Potassium: 4.4 mmol/L (ref 3.5–5.2)
Sodium: 135 mmol/L (ref 134–144)

## 2017-02-28 LAB — CBC WITH DIFFERENTIAL/PLATELET
BASOS ABS: 0.1 10*3/uL (ref 0.0–0.2)
Basos: 1 %
EOS (ABSOLUTE): 0.1 10*3/uL (ref 0.0–0.4)
Eos: 1 %
HEMOGLOBIN: 13.5 g/dL (ref 13.0–17.7)
Hematocrit: 38.7 % (ref 37.5–51.0)
LYMPHS: 19 %
Lymphocytes Absolute: 1.5 10*3/uL (ref 0.7–3.1)
MCH: 30.5 pg (ref 26.6–33.0)
MCHC: 34.9 g/dL (ref 31.5–35.7)
MCV: 87 fL (ref 79–97)
MONOS ABS: 0.7 10*3/uL (ref 0.1–0.9)
Monocytes: 9 %
NEUTROS ABS: 5.3 10*3/uL (ref 1.4–7.0)
NEUTROS PCT: 70 %
Platelets: 350 10*3/uL (ref 150–379)
RBC: 4.43 x10E6/uL (ref 4.14–5.80)
RDW: 14.3 % (ref 12.3–15.4)
WBC: 7.6 10*3/uL (ref 3.4–10.8)

## 2017-02-28 LAB — PROTIME-INR
INR: 3 — AB (ref 0.8–1.2)
PROTHROMBIN TIME: 30.5 s — AB (ref 9.1–12.0)

## 2017-03-01 ENCOUNTER — Telehealth: Payer: Self-pay

## 2017-03-01 NOTE — Telephone Encounter (Signed)
Called patient yesterday 02/28/17 no answer.Left message on personal voice mail Cath scheduled 03/01/17 cancelled due to INR to high.Advised to continue to hold coumadin.Cath rescheduled to Tuesday 03/05/17 arrive at 11:30 am.Advised to follow instructions given.Advised to call me back so I will know you understood message.Trish called advised pt will need INR before cath 03/05/17. Called patient no answer.Left message cath rescheduled to 03/05/17 arrive at 11:30 am.Advised to continue to hold coumadin.Follow instructions given.

## 2017-03-05 ENCOUNTER — Ambulatory Visit (HOSPITAL_COMMUNITY)
Admission: RE | Disposition: A | Discharge status: Home / Self Care | Payer: Self-pay | Source: Ambulatory Visit | Attending: Cardiology

## 2017-03-05 ENCOUNTER — Ambulatory Visit (HOSPITAL_COMMUNITY)
Admission: RE | Admit: 2017-03-05 | Discharge: 2017-03-05 | Disposition: A | Discharge status: Home / Self Care | Payer: Medicare Other | Source: Ambulatory Visit | Attending: Cardiology | Admitting: Cardiology

## 2017-03-05 ENCOUNTER — Other Ambulatory Visit: Payer: Self-pay | Admitting: Cardiology

## 2017-03-05 DIAGNOSIS — E785 Hyperlipidemia, unspecified: Secondary | ICD-10-CM | POA: Insufficient documentation

## 2017-03-05 DIAGNOSIS — I2510 Atherosclerotic heart disease of native coronary artery without angina pectoris: Secondary | ICD-10-CM | POA: Insufficient documentation

## 2017-03-05 DIAGNOSIS — I5022 Chronic systolic (congestive) heart failure: Secondary | ICD-10-CM

## 2017-03-05 DIAGNOSIS — I420 Dilated cardiomyopathy: Secondary | ICD-10-CM | POA: Diagnosis present

## 2017-03-05 DIAGNOSIS — I5023 Acute on chronic systolic (congestive) heart failure: Secondary | ICD-10-CM | POA: Diagnosis present

## 2017-03-05 DIAGNOSIS — I712 Thoracic aortic aneurysm, without rupture, unspecified: Secondary | ICD-10-CM | POA: Diagnosis present

## 2017-03-05 DIAGNOSIS — I2584 Coronary atherosclerosis due to calcified coronary lesion: Secondary | ICD-10-CM | POA: Diagnosis not present

## 2017-03-05 DIAGNOSIS — Z87891 Personal history of nicotine dependence: Secondary | ICD-10-CM | POA: Diagnosis not present

## 2017-03-05 DIAGNOSIS — Z7901 Long term (current) use of anticoagulants: Secondary | ICD-10-CM | POA: Diagnosis not present

## 2017-03-05 DIAGNOSIS — Z952 Presence of prosthetic heart valve: Secondary | ICD-10-CM

## 2017-03-05 DIAGNOSIS — I272 Pulmonary hypertension, unspecified: Secondary | ICD-10-CM | POA: Diagnosis not present

## 2017-03-05 HISTORY — PX: RIGHT/LEFT HEART CATH AND CORONARY ANGIOGRAPHY: CATH118266

## 2017-03-05 LAB — POCT I-STAT 3, VENOUS BLOOD GAS (G3P V)
ACID-BASE EXCESS: 4 mmol/L — AB (ref 0.0–2.0)
BICARBONATE: 30 mmol/L — AB (ref 20.0–28.0)
O2 SAT: 70 %
TCO2: 32 mmol/L (ref 0–100)
pCO2, Ven: 51.4 mmHg (ref 44.0–60.0)
pH, Ven: 7.375 (ref 7.250–7.430)
pO2, Ven: 38 mmHg (ref 32.0–45.0)

## 2017-03-05 LAB — POCT I-STAT 3, ART BLOOD GAS (G3+)
ACID-BASE EXCESS: 4 mmol/L — AB (ref 0.0–2.0)
Bicarbonate: 28.9 mmol/L — ABNORMAL HIGH (ref 20.0–28.0)
O2 SAT: 97 %
TCO2: 30 mmol/L (ref 0–100)
pCO2 arterial: 45.6 mmHg (ref 32.0–48.0)
pH, Arterial: 7.41 (ref 7.350–7.450)
pO2, Arterial: 96 mmHg (ref 83.0–108.0)

## 2017-03-05 LAB — PROTIME-INR
INR: 1.04
PROTHROMBIN TIME: 13.7 s (ref 11.4–15.2)

## 2017-03-05 SURGERY — RIGHT/LEFT HEART CATH AND CORONARY ANGIOGRAPHY
Anesthesia: LOCAL

## 2017-03-05 MED ORDER — SODIUM CHLORIDE 0.9 % IV SOLN: 250.0000 mL | INTRAVENOUS | Status: DC | PRN

## 2017-03-05 MED ORDER — SODIUM CHLORIDE 0.9% FLUSH: 3.0000 mL | Freq: Two times a day (BID) | INTRAVENOUS | Status: DC

## 2017-03-05 MED ORDER — SODIUM CHLORIDE 0.9 % WEIGHT BASED INFUSION: 1.0000 mL/kg/h | INTRAVENOUS | Status: DC

## 2017-03-05 MED ORDER — IODIXANOL 320 MG/ML IV SOLN
INTRAVENOUS | Status: DC | PRN
  Administered 2017-03-05: 60 mL via INTRA_ARTERIAL

## 2017-03-05 MED ORDER — SODIUM CHLORIDE 0.9% FLUSH: 3.0000 mL | INTRAVENOUS | Status: DC | PRN

## 2017-03-05 MED ORDER — HEPARIN (PORCINE) IN NACL 2-0.9 UNIT/ML-% IJ SOLN
INTRAMUSCULAR | Status: DC | PRN
  Administered 2017-03-05: 1000 mL

## 2017-03-05 MED ORDER — ASPIRIN 81 MG PO CHEW
81.0000 mg | CHEWABLE_TABLET | ORAL | Status: AC
  Administered 2017-03-05: 81 mg via ORAL

## 2017-03-05 MED ORDER — LIDOCAINE HCL 1 % IJ SOLN
INTRAMUSCULAR | Status: AC
  Filled 2017-03-05: qty 20

## 2017-03-05 MED ORDER — LIDOCAINE HCL (PF) 1 % IJ SOLN
INTRAMUSCULAR | Status: DC | PRN
  Administered 2017-03-05: 15 mL via SUBCUTANEOUS
  Administered 2017-03-05: 2 mL

## 2017-03-05 MED ORDER — ASPIRIN 81 MG PO CHEW
CHEWABLE_TABLET | ORAL | Status: AC
  Administered 2017-03-05: 81 mg via ORAL
  Filled 2017-03-05: qty 1

## 2017-03-05 MED ORDER — IOPAMIDOL (ISOVUE-370) INJECTION 76%
INTRAVENOUS | Status: AC
  Filled 2017-03-05: qty 100

## 2017-03-05 MED ORDER — CARVEDILOL 3.125 MG PO TABS: 6.2500 mg | ORAL_TABLET | Freq: Two times a day (BID) | ORAL | 3 refills | Status: DC

## 2017-03-05 MED ORDER — SODIUM CHLORIDE 0.9 % IV SOLN
INTRAVENOUS | Status: DC
  Administered 2017-03-05: 13:00:00 via INTRAVENOUS

## 2017-03-05 MED ORDER — HEPARIN SODIUM (PORCINE) 1000 UNIT/ML IJ SOLN
INTRAMUSCULAR | Status: AC
  Filled 2017-03-05: qty 1

## 2017-03-05 MED ORDER — LOSARTAN POTASSIUM 25 MG PO TABS: 25.0000 mg | ORAL_TABLET | Freq: Every day | ORAL | 11 refills | Status: DC

## 2017-03-05 MED ORDER — VERAPAMIL HCL 2.5 MG/ML IV SOLN
INTRAVENOUS | Status: AC
  Filled 2017-03-05: qty 2

## 2017-03-05 SURGICAL SUPPLY — 17 items
CATH 5FR JL3.5 JR4 ANG PIG MP (CATHETERS) ×1 IMPLANT
CATH BALLN WEDGE 5F 110CM (CATHETERS) ×1 IMPLANT
CATH EXPO 5F FL5 (CATHETERS) ×1 IMPLANT
CATH INFINITI 5FR JL4 (CATHETERS) ×1 IMPLANT
CATH S G BIP PACING (SET/KITS/TRAYS/PACK) IMPLANT
CATH SWAN GANZ 7F STRAIGHT (CATHETERS) ×1 IMPLANT
GLIDESHEATH SLEND SS 6F .021 (SHEATH) IMPLANT
GUIDEWIRE .025 260CM (WIRE) ×1 IMPLANT
KIT HEART LEFT (KITS) ×2 IMPLANT
PACK CARDIAC CATHETERIZATION (CUSTOM PROCEDURE TRAY) ×2 IMPLANT
SHEATH GLIDE SLENDER 4/5FR (SHEATH) ×1 IMPLANT
SHEATH PINNACLE 5F 10CM (SHEATH) ×1 IMPLANT
SHEATH PINNACLE 7F 10CM (SHEATH) ×1 IMPLANT
SHEATH PINNACLE 8F 10CM (SHEATH) IMPLANT
TRANSDUCER W/STOPCOCK (MISCELLANEOUS) ×2 IMPLANT
TUBING CIL FLEX 10 FLL-RA (TUBING) ×2 IMPLANT
WIRE EMERALD 3MM-J .035X150CM (WIRE) ×1 IMPLANT

## 2017-03-05 NOTE — Progress Notes (Signed)
Up and walked and tol well; right groin stable no bleeding or hematoma 

## 2017-03-05 NOTE — H&P (View-Only) (Signed)
Jack Joseph Date of Birth: November 01, 1946   History of Present Illness: Jack Joseph is seen today for followup of abnormal chest CT and Echo. 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 He is on chronic Coumadin therapy. He has a history of dilated cardiomyopathy with prior ejection fraction of 30-35%. He has consistently refused additional medical therapy in the past such as ACEi or beta blockers over the years.   When seen previously he noted a 13 lb weight loss since December. He thinks it was just because he isn't eating as much. Denies change in appetite or bowels. No bleeding. No chest pain. Some chronic dyspnea. Quit smoking 2002. To further evaluate his weigh loss labs were drawn. LDL was 121, TSH mildly elevated 5.39. CBC, CMET, PSA were normal. CXR showed a left hilar rounded mass and CT was recommended. 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 An Echo was also obtained showing normal functioning of his prosthetic AV. It confirmed the ascending aortic aneurysm. LV function was severely reduced with EF 25-30%. There was mild pulmonary HTN.   Current Outpatient Prescriptions on File Prior to Visit  Medication Sig Dispense Refill  . sildenafil (REVATIO) 20 MG tablet TAKE 1-5 TABLETS BY MOUTH AS NEEDED FOR SEXUAL ACTIVITY 50 tablet 1   No current facility-administered medications on file prior to visit.     No Known Allergies  Past Medical History:  Diagnosis Date  . AVD (aortic valve disease)   . Chronic anticoagulation   . Dilated cardiomyopathy (HCC)    EF 30-35%  . Hyperlipidemia   . LV dysfunction     Past Surgical History:  Procedure Laterality Date  . AORTIC VALVE REPLACEMENT    . DOPPLER ECHOCARDIOGRAPHY  04/07/2002   EF 30-35%    History  Smoking Status  . Former Smoker  . Quit date: 11/12/2000  Smokeless Tobacco  . Never  Used    History  Alcohol Use No    Family History  Problem Relation Age of Onset  . Heart disease Brother     Review of Systems: As noted in history of present illness.  All other systems were reviewed and are negative.  Physical Exam: BP 112/63   Pulse 94   Ht 5\' 10"  (1.778 m)   Wt 146 lb (66.2 kg)   BMI 20.95 kg/m  He is a pleasant thin white male in no acute distress. Appears much thinner than before. HEENT exam is unremarkable. Neck is supple without JVD, adenopathy, thyromegaly, or bruits. Lungs reveal coarse wheezes in right lung. Cardiac exam reveals a good mechanical aortic valve click without murmur or S3. Abdomen soft and nontender without masses. He does have a midline abdominal bruit.  He has no lower extremity edema but does have large superficial varicosities in the lower extremities. Skin is warm and dry. His neurologic exam is nonfocal.  LABORATORY DATA:  Lab Results  Component Value Date   WBC 6.4 01/22/2017   HCT 39.5 01/22/2017   PLT 211 01/22/2017   GLUCOSE 97 01/22/2017   CHOL 199 01/22/2017   TRIG 71 01/22/2017   HDL 64 01/22/2017   LDLCALC 121 (H) 01/22/2017   ALT 13 01/22/2017   AST 22 01/22/2017   NA 138 01/22/2017   K 4.6 01/22/2017   CL 102 01/22/2017   CREATININE 1.08 01/22/2017   BUN  15 01/22/2017   CO2 31 (H) 01/22/2017   TSH 5.390 (H) 01/22/2017   INR 3.3 01/07/2017    CHEST  2 VIEW  COMPARISON:  None.  FINDINGS: No active infiltrate or effusion is seen. The lungs are perhaps slightly hyperaerated. There is a nodular opacity overlying the left medial lung adjacent to the hilum, measuring 24 mm in diameter. This is very rounded and could possibly represent aneurysm possibly of the left pulmonary artery versus mass or adenopathy. CT the chest with IV contrast media is recommended. The heart is within upper limits of normal. The descending thoracic aorta is ectatic. Median sternotomy sutures are noted from prior aortic valve  replacement. No acute bony abnormality is seen.  IMPRESSION: Rounded opacity overlying the left medial lung near the hilum could be vascular in origin, i.e. aneurysm possibly from the pulmonary artery although mass or adenopathy cannot be excluded. Recommend CT of the chest with IV contrast media.   Electronically Signed   By: Dwyane Dee M.D.   On: 01/09/2017 16:27  CT ANGIOGRAPHY CHEST WITH CONTRAST  TECHNIQUE: Multidetector CT imaging of the chest was performed using the standard protocol during bolus administration of intravenous contrast. Multiplanar CT image reconstructions and MIPs were obtained to evaluate the vascular anatomy.  CONTRAST:  100 cc Isovue 300  COMPARISON:  Chest x-ray dated 01/09/2017.  FINDINGS: Cardiovascular: Prominent aneurysm of the ascending thoracic aorta, measuring 6.3 cm diameter.  Thoracic aortic arch and descending thoracic aorta are normal in caliber. Mild atherosclerotic change.  Heart size is normal. No pericardial effusion. Diffuse coronary artery calcifications.  Mediastinum/Nodes: No mass or enlarged lymph nodes within the mediastinum. Esophagus appears normal. Trachea and central bronchi are unremarkable.  Lungs/Pleura: Mixed density mass, with internal fat density, is seen within the left upper lobe, suprahilar, measuring 3.2 x 2.7 x 2.3 cm, corresponding to the finding on recent chest x-ray.  Mild scarring/atelectasis at each lung base.  No pleural effusion.  Upper Abdomen: No acute abnormality.  Musculoskeletal: Mild degenerative spurring throughout the kyphotic thoracolumbar spine. Median sternotomy wires in place. No acute or suspicious osseous finding.  Review of the MIP images confirms the above findings.  IMPRESSION: 1. Circumscribed mixed density mass, with partial fat density, located within the central aspects of the left upper lobe, suprahilar, measuring 3.2 cm greatest dimension,  corresponding to the finding on recent chest x-ray. 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 As there are no prior studies for comparison, recommend follow-up chest CT in 3-6 months to ensure stability. 2. Ascending thoracic aortic aneurysm measuring 6.3 cm diameter. Recommend semi-annual imaging followup by CTA or MRA and referral to cardiothoracic surgery if not already obtained. This recommendation follows 2010 ACCF/AHA/AATS/ACR/ASA/SCA/SCAI/SIR/STS/SVM Guidelines for the Diagnosis and Management of Patients With Thoracic Aortic Disease. Circulation. 2010; 121: e266-e369TAA. Aortic valve hardware in place. 3. Heart size is normal.  Diffuse coronary artery calcifications. 4. Aortic atherosclerosis.   Electronically Signed   By: Bary Richard M.D.   On: 02/01/2017 16:36  Echo 02/08/17: Study Conclusions  - Left ventricle: The cavity size was mildly dilated. Wall   thickness was normal. Systolic function was severely reduced. The   estimated ejection fraction was in the range of 25% to 30%.   Diffuse hypokinesis. Doppler parameters are consistent with   abnormal left ventricular relaxation (grade 1 diastolic   dysfunction). - Aortic valve: Mechanical aortic valve, well seated Vickki Hearing). Peak   velocity (S): 142 cm/s. - Aorta: CT scan report 6.3cm  aneursym of thoracic aorta. Aortic   root dimension: 42 mm (ED) at the sinus of Valsalva and 6.65cm   ascending root. - Ascending aorta: The ascending aorta was severely dilated. - Left atrium: The atrium was mildly dilated. Volume/bsa, S: 40.8   ml/m^2. - Pulmonary arteries: Systolic pressure was mildly increased. PA   peak pressure: 38 mm Hg (S).  Recommendations:  This procedure has been discussed with the referring physician.  Assessment / Plan: 1. 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 He is on chronic Coumadin therapy.  Good click. Good valve function by Echo. Continue  current therapy.  2. Dilated cardiomyopathy. Ejection fraction of 25-30%. This is consistent with prior evaluation. Patient is class 1-2. We have thoroughly discussed additional medical therapy and will initiate Coreg 3.125 mg bid. If tolerated will add an ACEi. BP generally low so will need to titrate cautiously.   3. Thoracic aortic aneurysm. Ascending aorta. 6.3 cm.  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 Recommend consideration for Aortic root grafting. Patient is reluctant to pursue and not sure he wants to pursue. I have recommended he at least be evaluated by CT surgery so he can be fully educated on his condition. If he is willing to consider surgery we will arrange a [facility].   4. Weight loss. Patient has actually gained 4 lbs in last month.  5. Abdominal bruit. On exam aorta does not feel aneurysmal. May need to consider CT or [place] evaluation of abdominal aorta. Will await CT surgery input.

## 2017-03-05 NOTE — Progress Notes (Signed)
Site area: Rt brachial, Rt fem vein and Rt fem art Site Prior to Removal:  Level O Pressure Applied For: art  rt brachial Manual:  yes  Patient Status During Pull:  A/O Post Pull Site:  Level 0 Post Pull Instructions Given:  Post instructions given and pt understands Post Pull Pulses Present: 2+ Rt dp Dressing Applied:  Tegaderm and a 2x2 applied to Rt brachial//Tegaderm and a 4x4 applied to Rt groin Bedrest begins @ 15:45:00 Comments: Pt leaves cath lab holding area in stable condition. Rt groin unremarkable, dressing is CDI with no bleeding or hematoma.

## 2017-03-05 NOTE — Discharge Instructions (Addendum)
Femoral Site Care Refer to this sheet in the next few weeks. These instructions provide you with information about caring for yourself after your procedure. Your health care provider may also give you more specific instructions. Your treatment has been planned according to current medical practices, but problems sometimes occur. Call your health care provider if you have any problems or questions after your procedure. What can I expect after the procedure? After your procedure, it is typical to have the following:  Bruising at the site that usually fades within 1-2 weeks.  Blood collecting in the tissue (hematoma) that may be painful to the touch. It should usually decrease in size and tenderness within 1-2 weeks. Follow these instructions at home:  Take medicines only as directed by your health care provider.  You may shower 24-48 hours after the procedure or as directed by your health care provider. Remove the bandage (dressing) and gently wash the site with plain soap and water. Pat the area dry with a clean towel. Do not rub the site, because this may cause bleeding.  Do not take baths, swim, or use a hot tub until your health care provider approves.  Check your insertion site every day for redness, swelling, or drainage.  Do not apply powder or lotion to the site.  Limit use of stairs to twice a day for the first 2-3 days or as directed by your health care provider.  Do not squat for the first 2-3 days or as directed by your health care provider.  Do not lift over 10 lb (4.5 kg) for 5 days after your procedure or as directed by your health care provider.  Ask your health care provider when it is okay to:  Return to work or school.  Resume usual physical activities or sports.  Resume sexual activity.  Do not drive home if you are discharged the same day as the procedure. Have someone else drive you.  You may drive 24 hours after the procedure unless otherwise instructed by  your health care provider.  Do not operate machinery or power tools for 24 hours after the procedure or as directed by your health care provider.  If your procedure was done as an outpatient procedure, which means that you went home the same day as your procedure, a responsible adult should be with you for the first 24 hours after you arrive home.  Keep all follow-up visits as directed by your health care provider. This is important. Contact a health care provider if:  You have a fever.  You have chills.  You have increased bleeding from the site. Hold pressure on the site. Get help right away if:  You have unusual pain at the site.  You have redness, warmth, or swelling at the site.  You have drainage (other than a small amount of blood on the dressing) from the site.  The site is bleeding, and the bleeding does not stop after 30 minutes of holding steady pressure on the site.  Your leg or foot becomes pale, cool, tingly, or numb. This information is not intended to replace advice given to you by your health care provider. Make sure you discuss any questions you have with your health care provider. Document Released: 07/02/2014 Document Revised: 04/05/2016 Document Reviewed: 05/18/2014 Elsevier Interactive Patient Education  2017 [employer]    You may resume coumadin  Increase Coreg to 6.25 mg twice a day  Add losartan 25 mg daily

## 2017-03-05 NOTE — Interval H&P Note (Signed)
History and Physical Interval Note:  03/05/2017 2:00 PM  Jack Joseph  has presented today for surgery, with the diagnosis of aortic ayneurism  The various methods of treatment have been discussed with the patient and family. After consideration of risks, benefits and other options for treatment, the patient has consented to  Procedure(s): Right/Left Heart Cath and Coronary Angiography (N/A) as a surgical intervention .  The patient's history has been reviewed, patient examined, no change in status, stable for surgery.  I have reviewed the patient's chart and labs.  Questions were answered to the patient's satisfaction.     Theron Arista [facility] 03/05/2017 2:00 PM

## 2017-03-06 ENCOUNTER — Encounter (HOSPITAL_COMMUNITY): Payer: Self-pay | Admitting: Cardiology

## 2017-03-06 MED FILL — Verapamil HCl IV Soln 2.5 MG/ML: INTRAVENOUS | Qty: 2 | Status: AC

## 2017-03-08 ENCOUNTER — Encounter (HOSPITAL_COMMUNITY): Payer: Self-pay | Admitting: Cardiology

## 2017-03-19 ENCOUNTER — Encounter: Payer: Self-pay | Admitting: Cardiothoracic Surgery

## 2017-03-19 ENCOUNTER — Ambulatory Visit (INDEPENDENT_AMBULATORY_CARE_PROVIDER_SITE_OTHER): Payer: Medicare Other | Admitting: Cardiothoracic Surgery

## 2017-03-19 VITALS — BP 116/69 | HR 87 | Resp 20 | Ht 69.0 in | Wt 145.8 lb

## 2017-03-19 DIAGNOSIS — Z952 Presence of prosthetic heart valve: Secondary | ICD-10-CM

## 2017-03-19 DIAGNOSIS — I712 Thoracic aortic aneurysm, without rupture, unspecified: Secondary | ICD-10-CM

## 2017-03-19 DIAGNOSIS — I5022 Chronic systolic (congestive) heart failure: Secondary | ICD-10-CM | POA: Diagnosis not present

## 2017-03-19 NOTE — Progress Notes (Signed)
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 male is seen in the office  today for dilated ascending aorta. Patient had replacement of his aortic valve with a  with  27 mm Bjork-Shiley tilting disk valve  27 [id number] in 1983 for Aortic Insufficiency, probability a bicuspid valve. In 2003 echocardiogram noted LV dysfunction 30-35 % and 39 mm aorta just above the cusps and 58 mm at mid ascending aorta. 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 He has been using crutches to walk around .  He has no known history of MI., no recent cath   I have contacted the [facility] recall  Administrator  and confirmed the serial number of the patients valve is not included in the [facility] CC valve recall in the 1980's. Surgery done by Dr Raenette Rover [facility]    Since last seen the patient has had cardiac catheterization, in addition notes that his difficulty walking and lower extremity edema is much improved since he was last here.   Current Activity/ Functional Status:  Patient is independent with mobility/ambulation, transfers, ADL's, IADL's.   Zubrod Score: At the time of surgery this patient's most appropriate activity status/level should be described as: []     0    Normal activity, no symptoms [x]     1    Restricted in physical strenuous activity but ambulatory, able to do out light work, patient has difficulty walking  using crutches  []     2    Ambulatory and capable of self care, unable to do work activities, up and about               >50 % of waking hours                              []     3    Only limited self care, in bed greater than 50% of waking hours []     4    Completely disabled, no self care, confined to bed or chair []     5    Moribund   Past Medical History:  Diagnosis Date  . AVD (aortic valve disease)   . Chronic anticoagulation   . Dilated cardiomyopathy (HCC)    EF 30-35%  . Hyperlipidemia   . LV dysfunction     Past Surgical History:  Procedure Laterality Date  . AORTIC VALVE REPLACEMENT    . DOPPLER ECHOCARDIOGRAPHY  04/07/2002   EF 30-35%  . RIGHT/LEFT HEART CATH AND CORONARY ANGIOGRAPHY N/A 03/05/2017   Procedure: Right/Left Heart Cath and Coronary Angiography;  Surgeon: Peter M Swaziland, MD;  Location: [facility] INVASIVE CV LAB;  Service: Cardiovascular;  Laterality: N/A;  Family History  Problem Relation Age of Onset  . Heart disease Brother     Social History   Social History  . Marital status: Married    Spouse name: N/A  . Number of children: N/A  . Years of education: N/A   Occupational History  . Not on file.   Social History Main Topics  . Smoking status: Former Smoker    Quit date: 11/12/2000  . Smokeless tobacco: Never Used  . Alcohol use No  . Drug use: No  . Sexual activity: Yes   Other Topics Concern  . Not on file   Social History Narrative  . No narrative on file    History  Smoking Status  . Former Smoker  . Quit date: 11/12/2000  Smokeless Tobacco  . Never Used    History  Alcohol Use No     No Known Allergies  Current Outpatient Prescriptions  Medication Sig Dispense Refill  . carvedilol (COREG) 3.125 MG tablet Take 2 tablets (6.25 mg total) by mouth 2 (two) times daily. 180 tablet 3  . colchicine 0.6 MG tablet Take 0.6 mg by mouth daily as needed.    Marland Kitchen losartan (COZAAR) 25 MG tablet Take 1 tablet (25 mg total) by mouth daily.  30 tablet 11  . methylcellulose (CITRUCEL) oral powder Take 1 packet by mouth 2 (two) times daily as needed.    . sildenafil (REVATIO) 20 MG tablet TAKE 1-5 TABLETS BY MOUTH AS NEEDED FOR SEXUAL ACTIVITY 50 tablet 1  . traMADol (ULTRAM) 50 MG tablet Take 50 mg by mouth every 6 (six) hours as needed for moderate pain.    Marland Kitchen warfarin (COUMADIN) 5 MG tablet Take 1 tablet by mouth daily or as directed by [facility] 90 tablet 1   No current facility-administered medications for this visit.      Review of Systems:     Cardiac Review of Systems: Y or N  Chest Pain [  n  ]  Resting SOB [ y  ] Exertional SOB  Cove.Etienne  ]  Orthopnea [ y ]   Pedal Edema Cove.Etienne   ]    Palpitations Milo.Brash  ] Syncope  Milo.Brash  ]   Presyncope [ n  ]  General Review of Systems: [Y] = yes [  ]=no Constitional: recent weight change [ n ];  Wt loss over the last 3 months [   ] anorexia [  ]; fatigue Cove.Etienne  ]; nausea [  ]; night sweats [  ]; fever [  ]; or chills [  ];          Dental: poor dentition[  ]; Last Dentist visit:   Eye : blurred vision [  ]; diplopia [   ]; vision changes [  ];  Amaurosis fugax[  ]; Resp: cough [ n ];  wheezing[ n ];  hemoptysis[n  ]; shortness of breath[y  ]; paroxysmal nocturnal dyspnea[y  ]; dyspnea on exertion[y  ]; or orthopnea[ y ];  GI:  gallstones[  ], vomiting[  ];  dysphagia[  ]; melena[  ];  hematochezia [  ]; heartburn[  ];   Hx of  Colonoscopy[  ]; GU: kidney stones [  ]; hematuria[  ];   dysuria [  ];  nocturia[  ];  history of     obstruction [  ]; urinary frequency [  ]             Skin: rash, swelling[  ];, hair loss[  ];  peripheral edema[y  ];  or itching[  ]; Musculosketetal: myalgias[ y ];  joint swelling[y  ];  joint erythema[  ];  joint pain[  ];  back pain[  ];  Heme/Lymph: bruising[  ];  bleeding[  ];  anemia[  ];  Neuro: TIA[  ];  headaches[  ];  stroke[  ];  vertigo[  ];  seizures[n  ];   paresthesias[  ];  difficulty walking[y  ];  Psych:depression[  ]; anxiety[  ];  Endocrine: diabetes[   ];  thyroid dysfunction[  ];  Immunizations: Flu up to date Milo.Brash  ]; Pneumococcal up to date Milo.Brash  ];  Other:  Physical Exam: BP 116/69   Pulse 87   Resp 20   Ht 5\' 9"  (1.753 m)   Wt 145 lb 12.8 oz (66.1 kg)   SpO2 99% Comment: RA  BMI 21.53 kg/m   PHYSICAL EXAMINATION: General appearance: alert, cooperative, appears older than stated age and no distress Head: Normocephalic, without obvious abnormality, atraumatic Neck: no adenopathy, no carotid bruit, no JVD, supple, symmetrical, trachea midline and thyroid not enlarged, symmetric, no tenderness/mass/nodules Lymph nodes: Cervical, supraclavicular, and axillary nodes normal. Resp: diminished breath sounds bibasilar Back: symmetric, no curvature. ROM normal. No CVA tenderness. Cardio: regular rate and rhythm, ejection click present and no M of AI GI: soft, non-tender; bowel sounds normal; no masses,  no organomegaly Extremities: edema 2-3+ right greater then left, Homans sign is negative, no sign of DVT, varicose veins noted and venous stasis dermatitis noted Neurologic: Grossly normal Sternum stable and healed   Diagnostic Studies & Laboratory data:     Recent Radiology Findings:   Nm Pet Image Initial (pi) Skull Base To Thigh  Result Date: 02/21/2017 CLINICAL DATA:  Initial treatment strategy for left upper lobe mass. EXAM: NUCLEAR MEDICINE PET SKULL BASE TO THIGH TECHNIQUE: 7.4 mCi F-18 FDG was injected intravenously. Full-ring PET imaging was performed from the skull base to thigh after the radiotracer. CT data was obtained and used for attenuation correction and anatomic localization. FASTING BLOOD GLUCOSE:  Value: 110 mg/dl COMPARISON:  69/[id number]/[id number] chest CT FINDINGS: NECK No hypermetabolic lymph nodes in the neck. Carotid atherosclerotic calcifications. Physiologic symmetric activity in the lower neck strap musculature. CHEST The primarily fatty 2.7 by 2.3 cm left upper lobe suprahilar nodule has a maximum SUV of 2.2. Background  mediastinal blood pool activity is 2.2. This nodule is occluding various left upper lobe tracheobronchial branches with the exception of the lingular bronchi. Small mediastinal lymph nodes are not hypermetabolic. Mild airway thickening. Possible small broncholiths in the left lower lobe along with some peripheral airway plugging, unchanged from prior. Ascending thoracic aortic aneurysm. Aortic valve prosthesis. Coronary, aortic arch, and branch vessel atherosclerotic vascular disease. ABDOMEN/PELVIS No abnormal hypermetabolic activity within the liver, pancreas, adrenal glands, or spleen. No hypermetabolic lymph nodes in the abdomen or pelvis. Left scrotal hydrocele. 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 Calcifications in the renal hilum bilaterally are thought to be vascular. Prominent stool throughout the colon favors constipation. SKELETON No focal hypermetabolic activity to suggest skeletal metastasis. Thoracolumbar spondylosis. IMPRESSION: 1. Primarily fatty 2.7 by 2.3 cm left upper lobe suprahilar nodule is not hypermetabolic. 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 However, this does appear to be obstructing several segmental left upper lobe bronchi. 2. Mild airway thickening. Chronic peripheral airway plugging in parts of the left lower lobe possibly with some small broncholiths. 3. Stable appearance of ascending thoracic aortic aneurysm with aortic valve  prosthesis. 4. Other imaging findings of potential clinical significance: Coronary, aortic arch, and branch vessel atherosclerotic vascular disease. Aortoiliac atherosclerotic vascular disease. Prominent stool throughout the colon favors constipation. Left scrotal hydrocele. 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 Electronically Signed   By: Gaylyn Rong M.D.   On: 02/21/2017 08:53   Ct Angio Chest Aorta W &/or Wo Contrast  Result Date: 02/01/2017 CLINICAL DATA:   Abnormal chest x-ray on 02/28. Former smoker. History of CHF. EXAM: CT ANGIOGRAPHY CHEST WITH CONTRAST TECHNIQUE: Multidetector CT imaging of the chest was performed using the standard protocol during bolus administration of intravenous contrast. Multiplanar CT image reconstructions and MIPs were obtained to evaluate the vascular anatomy. CONTRAST:  100 cc Isovue 300 COMPARISON:  Chest x-ray dated 01/09/2017. FINDINGS: Cardiovascular: Prominent aneurysm of the ascending thoracic aorta, measuring 6.3 cm diameter. Thoracic aortic arch and descending thoracic aorta are normal in caliber. Mild atherosclerotic change. Heart size is normal. No pericardial effusion. Diffuse coronary artery calcifications. Mediastinum/Nodes: No mass or enlarged lymph nodes within the mediastinum. Esophagus appears normal. Trachea and central bronchi are unremarkable. Lungs/Pleura: Mixed density mass, with internal fat density, is seen within the left upper lobe, suprahilar, measuring 3.2 x 2.7 x 2.3 cm, corresponding to the finding on recent chest x-ray. Mild scarring/atelectasis at each lung base.  No pleural effusion. Upper Abdomen: No acute abnormality. Musculoskeletal: Mild degenerative spurring throughout the kyphotic thoracolumbar spine. Median sternotomy wires in place. No acute or suspicious osseous finding. Review of the MIP images confirms the above findings. IMPRESSION: 1. Circumscribed mixed density mass, with partial fat density, located within the central aspects of the left upper lobe, suprahilar, measuring 3.2 cm greatest dimension, corresponding to the finding on recent chest x-ray. 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 As there are no prior studies for comparison, recommend follow-up chest CT in 3-6 months to ensure stability. 2. Ascending thoracic aortic aneurysm measuring 6.3 cm diameter. Recommend semi-annual imaging followup by CTA or MRA and referral to cardiothoracic surgery if not already obtained.  This recommendation follows 2010 ACCF/AHA/AATS/ACR/ASA/SCA/SCAI/SIR/STS/SVM Guidelines for the Diagnosis and Management of Patients With Thoracic Aortic Disease. Circulation. 2010; 121: e266-e369TAA. Aortic valve hardware in place. 3. Heart size is normal.  Diffuse coronary artery calcifications. 4. Aortic atherosclerosis. Electronically Signed   By: Bary Richard M.D.   On: 02/01/2017 16:36     I have independently reviewed the above radiologic studies.  Recent Lab Findings: Lab Results  Component Value Date   WBC 7.6 02/27/2017   HCT 38.7 02/27/2017   PLT 350 02/27/2017   GLUCOSE 133 (H) 02/27/2017   CHOL 199 01/22/2017   TRIG 71 01/22/2017   HDL 64 01/22/2017   LDLCALC 121 (H) 01/22/2017   ALT 13 01/22/2017   AST 22 01/22/2017   NA 135 02/27/2017   K 4.4 02/27/2017   CL 97 02/27/2017   CREATININE 1.13 02/27/2017   BUN 15 02/27/2017   CO2 31 (H) 02/27/2017   TSH 5.390 (H) 01/22/2017   INR 1.04 03/05/2017   CATH: Procedures   Right/Left Heart Cath and Coronary Angiography  Conclusion     Ost LAD to Prox LAD lesion, 30 %stenosed.  Prox Cx to Mid Cx lesion, 30 %stenosed.  Mid RCA lesion, 20 %stenosed.   1. Nonobstructive CAD 2. Normal right heart and PCWP 3. Normal cardiac output. 4. By fluoro the AV disc moves well.   Plan: will increase Coreg to 6.25 mg bid and add losartan 25  mg daily for optimal CHF therapy. Plan Aortic grafting for aneurysm.    Indications   Thoracic aortic aneurysm without rupture (HCC) [I71.2 (ICD-10-CM)]  Procedural Details/Technique   Technical Details Indication: 71 yo WM s/p AVR in remote past, dilated CM, and newly diagnosed thoracic aortic aneurysm.  Procedural Details: The right groin was prepped, draped, and anesthetized with 1% lidocaine. Using the modified Seldinger technique a 5 Fr sheath was placed in the right femoral artery and a 7 French sheath was placed in the right femoral vein. A Swan-Ganz catheter was used for the  right heart catheterization. Standard protocol was followed for recording of right heart pressures and sampling of oxygen saturations. Fick cardiac output was calculated. Standard Judkins catheters were used for selective coronary angiography and left ventriculography. There were no immediate procedural complications. The patient was transferred to the post catheterization recovery area for further monitoring.  Contrast: 60 cc   Estimated blood loss <50 mL.  During this procedure no sedation was administered.    Complications   Complications documented before study signed (03/05/2017 2:59 PM EDT)    No complications were associated with this study.  Documented by Swaziland, Peter M, MD - 03/05/2017 2:59 PM EDT    Coronary Findings   Dominance: Right  Left Main  Vessel was injected. Vessel is normal in caliber. Vessel is angiographically normal.  Left Anterior Descending  Ost LAD to Prox LAD lesion, 30% stenosed. The lesion is severely calcified.  Left Circumflex  Prox Cx to Mid Cx lesion, 30% stenosed. The lesion is moderately calcified.  Right Coronary Artery  Vessel is large. The vessel is moderately ectatic.  Mid RCA lesion, 20% stenosed.  Coronary Diagrams   Diagnostic Diagram       Implants     No implant documentation for this case.  PACS Images   Show images for Cardiac catheterization   Link to Procedure Log   Procedure Log    Hemo Data    Most Recent Value  Fick Cardiac Output 4.86 L/min  Fick Cardiac Output Index 2.69 (L/min)/BSA  RA A Wave 5 mmHg  RA V Wave 5 mmHg  RA Mean 3 mmHg  RV Systolic Pressure 29 mmHg  RV Diastolic Pressure 0 mmHg  RV EDP 3 mmHg  PA Systolic Pressure 28 mmHg  PA Diastolic Pressure 9 mmHg  PA Mean 17 mmHg  PW A Wave 11 mmHg  PW V Wave 9 mmHg  PW Mean 10 mmHg  AO Systolic Pressure 126 mmHg  AO Diastolic Pressure 66 mmHg  AO Mean 90 mmHg  QP/QS 1  TPVR Index 6.34 HRUI  TSVR Index 33.54 HRUI  PVR SVR Ratio 0.08    TPVR/TSVR Ratio 0.19      Echo: [facility]*                  *[facility]*                          [address]                        [place], [place] [address]                            [phone]  ------------------------------------------------------------------- Echocardiography  Patient:    Jack Joseph, Jack Joseph MR #:       [id number] Study Date: 02/08/2017  Gender:     M Age:        59 Height:     177.8 cm Weight:     64.8 kg BSA:        1.78 m^2 Pt. Status: Room:   ATTENDING    Peter Swaziland, M.D.  ORDERING     Peter Swaziland, M.D.  REFERRING    Peter Swaziland, M.D.  SONOGRAPHER  Nolon Rod, RDCS  PERFORMING   [facility], Outpatient  cc:  ------------------------------------------------------------------- LV EF: 25% -   30%  ------------------------------------------------------------------- Indications:      Aortic stenosis 424.1.  ------------------------------------------------------------------- History:   PMH:   Aortic valve disease.  Risk factors:  Former tobacco use.  ------------------------------------------------------------------- Study Conclusions  - Left ventricle: The cavity size was mildly dilated. Wall   thickness was normal. Systolic function was severely reduced. The   estimated ejection fraction was in the range of 25% to 30%.   Diffuse hypokinesis. Doppler parameters are consistent with   abnormal left ventricular relaxation (grade 1 diastolic   dysfunction). - Aortic valve: Mechanical aortic valve, well seated Vickki Hearing). Peak   velocity (S): 142 cm/s. - Aorta: CT scan report 6.3cm aneursym of thoracic aorta. Aortic   root dimension: 42 mm (ED) at the sinus of Valsalva and 6.65cm   ascending root. - Ascending aorta: The ascending aorta was severely dilated. - Left atrium: The atrium was mildly dilated. Volume/bsa, S: 40.8   ml/m^2. - Pulmonary arteries: Systolic pressure was mildly increased. PA   peak  pressure: 38 mm Hg (S).  Recommendations:  This procedure has been discussed with the referring physician.  ------------------------------------------------------------------- Labs, prior tests, procedures, and surgery: Aortic valve replacement with a mechanical valve.  ------------------------------------------------------------------- Study data:   Study status:  Routine.  Study completion:  There were no complications.          Echocardiography.  M-mode, complete 2D, spectral Doppler, and color Doppler.  Birthdate:  Patient birthdate: [birth year].  Age:  Patient is 71 yr old.  Sex:  Gender: male.    BMI: 20.5 kg/m^2.  Blood pressure:     106/60  Patient status:  Outpatient.  Study date:  Study date: 02/08/2017. Study time: 10:50 AM.  Location:  [facility].  -------------------------------------------------------------------  ------------------------------------------------------------------- Left ventricle:  The cavity size was mildly dilated. Wall thickness was normal. Systolic function was severely reduced. The estimated ejection fraction was in the range of 25% to 30%. Diffuse hypokinesis. Doppler parameters are consistent with abnormal left ventricular relaxation (grade 1 diastolic dysfunction).  ------------------------------------------------------------------- Aortic valve:  Mechanical aortic valve, well seated Vickki Hearing). Doppler:  Transvalvular velocity was within the normal range. There was no stenosis. There was no regurgitation.    VTI ratio of LVOT to aortic valve: 1.02. Peak velocity ratio of LVOT to aortic valve: 0.97. Mean velocity ratio of LVOT to aortic valve: 1.03.    Mean gradient (S): 5 mm Hg. Peak gradient (S): 8 mm Hg.  ------------------------------------------------------------------- Aorta:  Ascending aorta: The ascending aorta was severely dilated.  ------------------------------------------------------------------- Mitral valve:    Structurally normal valve.   Leaflet separation was normal.  Doppler:  Transvalvular velocity was within the normal range. There was no evidence for stenosis. There was no regurgitation.  ------------------------------------------------------------------- Left atrium:  The atrium was mildly dilated.  ------------------------------------------------------------------- Right ventricle:  The cavity size was normal. Wall thickness was normal. Systolic function was normal.  ------------------------------------------------------------------- Pulmonic valve:   Poorly visualized.  Structurally normal valve. Cusp separation was normal.  Doppler:  Transvalvular velocity was within the normal range. There was trivial regurgitation.  ------------------------------------------------------------------- Tricuspid valve:   Structurally normal valve.   Leaflet separation was normal.  Doppler:  Transvalvular velocity was within the normal range. There was mild regurgitation.  ------------------------------------------------------------------- Pulmonary artery:   The main pulmonary artery was normal-sized. Systolic pressure was mildly increased.  ------------------------------------------------------------------- Right atrium:  The atrium was normal in size.  ------------------------------------------------------------------- Pericardium:  The pericardium was normal in appearance.  ------------------------------------------------------------------- Systemic veins: Inferior vena cava: The vessel was dilated. The respirophasic diameter changes were blunted (< 50%), consistent with elevated central venous pressure.  ------------------------------------------------------------------- Measurements   Left ventricle                          Value        Reference  LV ID, ED, PLAX chordal          (H)    63.5  mm     43 - 52  LV ID, ES, PLAX chordal          (H)    58    mm     23 - 38  LV  fx shortening, PLAX chordal   (L)    9     %      >=29  LV PW thickness, ED                     8.48  mm     ----------  IVS/LV PW ratio, ED                     0.94         <=1.3  LV ejection fraction, 1-p A4C           44    %      ----------  LV end-diastolic volume, 2-p            199   ml     ----------  LV end-systolic volume, 2-p             119   ml     ----------  LV ejection fraction, 2-p               40    %      ----------  Stroke volume, 2-p                      80    ml     ----------  LV end-diastolic volume/bsa, 2-p        112   ml/m^2 ----------  LV end-systolic volume/bsa, 2-p         67    ml/m^2 ----------  Stroke volume/bsa, 2-p                  44.9  ml/m^2 ----------  LV e&', lateral                          8.92  cm/s   ----------  LV E/e&', lateral                        7.78         ----------  LV e&', medial  5     cm/s   ----------  LV E/e&', medial                         13.88        ----------  LV e&', average                          6.96  cm/s   ----------  LV E/e&', average                        9.97         ----------  Longitudinal strain, TDI                17    %      ----------    Ventricular septum                      Value        Reference  IVS thickness, ED                       7.94  mm     ----------    LVOT                                    Value        Reference  LVOT peak velocity, S                   138   cm/s   ----------  LVOT mean velocity, S                   107   cm/s   ----------  LVOT VTI, S                             29.2  cm     ----------  LVOT peak gradient, S                   8     mm Hg  ----------    Aortic valve                            Value        Reference  Aortic valve peak velocity, S           142   cm/s   ----------  Aortic valve mean velocity, S           104   cm/s   ----------  Aortic valve VTI, S                     28.5  cm     ----------  Aortic mean gradient, S                  5     mm Hg  ----------  Aortic peak gradient, S                 8     mm Hg  ----------  VTI ratio, LVOT/AV  1.02         ----------  Velocity ratio, peak, LVOT/AV           0.97         ----------  Velocity ratio, mean, LVOT/AV           1.03         ----------    Aorta                                   Value        Reference  Aortic root ID, ED                      42    mm     ----------    Left atrium                             Value        Reference  LA ID, A-P, ES                          38    mm     ----------  LA ID/bsa, A-P                          2.13  cm/m^2 <=2.2  LA volume, S                            72.7  ml     ----------  LA volume/bsa, S                        40.8  ml/m^2 ----------  LA volume, ES, 1-p A4C                  59.2  ml     ----------  LA volume/bsa, ES, 1-p A4C              33.2  ml/m^2 ----------  LA volume, ES, 1-p A2C                  85.8  ml     ----------  LA volume/bsa, ES, 1-p A2C              48.1  ml/m^2 ----------    Mitral valve                            Value        Reference  Mitral E-wave peak velocity             69.4  cm/s   ----------  Mitral A-wave peak velocity             68.4  cm/s   ----------  Mitral deceleration time                201   ms     150 - 230  Mitral E/A ratio, peak                  1            ----------  Mitral regurg VTI, PISA  188   cm     ----------    Pulmonary arteries                      Value        Reference  PA pressure, S, DP               (H)    38    mm Hg  <=30    Tricuspid valve                         Value        Reference  Tricuspid regurg peak velocity          238   cm/s   ----------  Tricuspid peak RV-RA gradient           23    mm Hg  ----------    Right atrium                            Value        Reference  RA ID, S-I, ES, A4C              (H)    52    mm     34 - 49  RA area, ES, A4C                        18.2  cm^2   8.3 - 19.5  RA volume,  ES, A/L                      53    ml     ----------  RA volume/bsa, ES, A/L                  29.7  ml/m^2 ----------    Systemic veins                          Value        Reference  Estimated CVP                           15    mm Hg  ----------    Right ventricle                         Value        Reference  RV pressure, S, DP               (H)    38    mm Hg  <=30  RV s&', lateral, S                       9.14  cm/s   ----------  Legend: (L)  and  (H)  mark values outside specified reference range.  ------------------------------------------------------------------- Prepared and Electronically Authenticated by  Donato Schultz, M.D. 2018-03-30T12:37:39   Aortic Size Index=   6.3      /Body surface area is 1.79 meters squared. 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 The estimated ejection fraction was in the range of 25% to 30%. Stage C, Class III     Diffuse hypokinesis. Doppler parameters indicate  abnormal left ventricular relaxation (grade 1 diastolic dysfunction). 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 I had recommended to him that if  we needed to replace his aortic valve with the ascending aorta, with his current age it would seem reasonable to put a tissue valve and so he is not committed to Coumadin the remainder of his life.  He notes that he is not opposed to Coumadin but is willing to have a tissue valve should we require replacement. He would like to wait several weeks before surgery so his wife can be in town to assist in his care after discharge will tentatively plan to proceed with surgery on June 18.  the patient and his wife are aware of the high risk nature of this suture with the redo status, his poor underlying ventricular function. Risks of death infection stroke myocardial infarction bleeding blood transfusion and need for permanent pacemaker have all been discussed. .  I have contacted the [facility] recall  Administrator  and confirmed the serial number of the patients valve is not included in the [facility] CC valve recall in the 1980's  [name] MD      [address] [place] [place] [address] Office [phone]   Beeper [phone]  03/19/2017 4:55 PM

## 2017-03-20 ENCOUNTER — Other Ambulatory Visit: Payer: Self-pay | Admitting: *Deleted

## 2017-03-20 DIAGNOSIS — I77819 Aortic ectasia, unspecified site: Secondary | ICD-10-CM

## 2017-03-26 ENCOUNTER — Ambulatory Visit: Payer: Medicare Other | Admitting: Cardiology

## 2017-03-29 ENCOUNTER — Ambulatory Visit (INDEPENDENT_AMBULATORY_CARE_PROVIDER_SITE_OTHER): Payer: Medicare Other | Admitting: Pharmacist

## 2017-03-29 DIAGNOSIS — Z7901 Long term (current) use of anticoagulants: Secondary | ICD-10-CM

## 2017-03-29 DIAGNOSIS — Z952 Presence of prosthetic heart valve: Secondary | ICD-10-CM

## 2017-03-29 LAB — POCT INR: INR: 3.4

## 2017-04-24 NOTE — Pre-Procedure Instructions (Signed)
    Jack Joseph  04/24/2017      [facility] - [place], [place] - [address] [place] [address] Phone: [phone] Fax: [phone]  [facility] [address] [place] - [address] [place] [address] Phone: [phone] Fax: [phone]    Your procedure is scheduled on 04/29/17.  Report to [facility] Admitting at 530 A.M.  Call this number if you have problems the morning of surgery:  [phone]   Remember:  Do not eat food or drink liquids after midnight.  Take these medicines the morning of surgery with A SIP OF WATER --carvedilol,ultram,colchicine   Do not wear jewelry, make-up or nail polish.  Do not wear lotions, powders, or perfumes, or deoderant.  Do not shave 48 hours prior to surgery.  Men may shave face and neck.  Do not bring valuables to the hospital.  [facility] is not responsible for any belongings or valuables.  Contacts, dentures or bridgework may not be worn into surgery.  Leave your suitcase in the car.  After surgery it may be brought to your room.  For patients admitted to the hospital, discharge time will be determined by your treatment team.  Patients discharged the day of surgery will not be allowed to drive home.   Name and phone number of your driver:    Special instructions:  Stop all nsaids 5 days prior to surgery  Please read over the following fact sheets that you were given. MRSA Information

## 2017-04-25 ENCOUNTER — Encounter (HOSPITAL_COMMUNITY): Payer: Self-pay

## 2017-04-25 ENCOUNTER — Ambulatory Visit (INDEPENDENT_AMBULATORY_CARE_PROVIDER_SITE_OTHER): Payer: Medicare Other | Admitting: Cardiothoracic Surgery

## 2017-04-25 ENCOUNTER — Ambulatory Visit (HOSPITAL_BASED_OUTPATIENT_CLINIC_OR_DEPARTMENT_OTHER)
Admission: RE | Admit: 2017-04-25 | Discharge: 2017-04-25 | Disposition: A | Discharge status: Home / Self Care | Payer: Medicare Other | Source: Ambulatory Visit | Attending: Cardiothoracic Surgery | Admitting: Cardiothoracic Surgery

## 2017-04-25 ENCOUNTER — Encounter: Payer: Self-pay | Admitting: Cardiothoracic Surgery

## 2017-04-25 ENCOUNTER — Ambulatory Visit (HOSPITAL_COMMUNITY)
Admission: RE | Admit: 2017-04-25 | Discharge: 2017-04-25 | Disposition: A | Discharge status: Home / Self Care | Payer: Medicare Other | Source: Ambulatory Visit | Attending: Cardiothoracic Surgery | Admitting: Cardiothoracic Surgery

## 2017-04-25 ENCOUNTER — Encounter (HOSPITAL_COMMUNITY)
Admission: RE | Admit: 2017-04-25 | Discharge: 2017-04-25 | Disposition: A | Discharge status: Home / Self Care | Payer: Medicare Other | Source: Ambulatory Visit | Attending: Cardiothoracic Surgery | Admitting: Cardiothoracic Surgery

## 2017-04-25 ENCOUNTER — Ambulatory Visit (INDEPENDENT_AMBULATORY_CARE_PROVIDER_SITE_OTHER): Payer: Medicare Other | Admitting: Pharmacist

## 2017-04-25 VITALS — BP 112/66 | HR 76 | Resp 16 | Ht 70.0 in | Wt 173.0 lb

## 2017-04-25 DIAGNOSIS — I5022 Chronic systolic (congestive) heart failure: Secondary | ICD-10-CM

## 2017-04-25 DIAGNOSIS — I77819 Aortic ectasia, unspecified site: Secondary | ICD-10-CM | POA: Diagnosis not present

## 2017-04-25 DIAGNOSIS — R9431 Abnormal electrocardiogram [ECG] [EKG]: Secondary | ICD-10-CM | POA: Insufficient documentation

## 2017-04-25 DIAGNOSIS — I6523 Occlusion and stenosis of bilateral carotid arteries: Secondary | ICD-10-CM | POA: Insufficient documentation

## 2017-04-25 DIAGNOSIS — R911 Solitary pulmonary nodule: Secondary | ICD-10-CM | POA: Diagnosis not present

## 2017-04-25 DIAGNOSIS — I712 Thoracic aortic aneurysm, without rupture, unspecified: Secondary | ICD-10-CM

## 2017-04-25 DIAGNOSIS — J439 Emphysema, unspecified: Secondary | ICD-10-CM | POA: Diagnosis not present

## 2017-04-25 DIAGNOSIS — Z952 Presence of prosthetic heart valve: Secondary | ICD-10-CM

## 2017-04-25 DIAGNOSIS — Z7901 Long term (current) use of anticoagulants: Secondary | ICD-10-CM | POA: Diagnosis not present

## 2017-04-25 HISTORY — DX: Cardiac arrhythmia, unspecified: I49.9

## 2017-04-25 LAB — CBC
HCT: 41 % (ref 39.0–52.0)
Hemoglobin: 13.6 g/dL (ref 13.0–17.0)
MCH: 31 pg (ref 26.0–34.0)
MCHC: 33.2 g/dL (ref 30.0–36.0)
MCV: 93.4 fL (ref 78.0–100.0)
Platelets: 178 10*3/uL (ref 150–400)
RBC: 4.39 MIL/uL (ref 4.22–5.81)
RDW: 14.4 % (ref 11.5–15.5)
WBC: 8.2 10*3/uL (ref 4.0–10.5)

## 2017-04-25 LAB — PULMONARY FUNCTION TEST
DL/VA % pred: 73 %
DL/VA: 3.4 ml/min/mmHg/L
DLCO unc % pred: 46 %
DLCO unc: 15.14 ml/min/mmHg
FEF 25-75 Post: 0.67 L/sec
FEF 25-75 Pre: 0.43 L/sec
FEF2575-%Change-Post: 55 %
FEF2575-%Pred-Post: 28 %
FEF2575-%Pred-Pre: 18 %
FEV1-%Change-Post: 18 %
FEV1-%Pred-Post: 42 %
FEV1-%Pred-Pre: 35 %
FEV1-Post: 1.35 L
FEV1-Pre: 1.14 L
FEV1FVC-%Change-Post: 6 %
FEV1FVC-%Pred-Pre: 64 %
FEV6-%Change-Post: 10 %
FEV6-%Pred-Post: 59 %
FEV6-%Pred-Pre: 53 %
FEV6-Post: 2.45 L
FEV6-Pre: 2.21 L
FEV6FVC-%Change-Post: 0 %
FEV6FVC-%Pred-Post: 96 %
FEV6FVC-%Pred-Pre: 97 %
FVC-%Change-Post: 11 %
FVC-%Pred-Post: 61 %
FVC-%Pred-Pre: 55 %
FVC-Post: 2.7 L
FVC-Pre: 2.41 L
Post FEV1/FVC ratio: 50 %
Post FEV6/FVC ratio: 91 %
Pre FEV1/FVC ratio: 47 %
Pre FEV6/FVC Ratio: 92 %
RV % pred: 184 %
RV: 4.59 L
TLC % pred: 102 %
TLC: 7.22 L

## 2017-04-25 LAB — BLOOD GAS, ARTERIAL
Acid-Base Excess: 1.8 mmol/L (ref 0.0–2.0)
Bicarbonate: 25.8 mmol/L (ref 20.0–28.0)
Drawn by: 421801
FIO2: 21
O2 Saturation: 95.6 %
Patient temperature: 98.6
pCO2 arterial: 40.5 mmHg (ref 32.0–48.0)
pH, Arterial: 7.421 (ref 7.350–7.450)
pO2, Arterial: 80.8 mmHg — ABNORMAL LOW (ref 83.0–108.0)

## 2017-04-25 LAB — VAS US DOPPLER PRE CABG
LEFT ECA DIAS: -11 cm/s
LEFT VERTEBRAL DIAS: -4 cm/s
Left CCA dist dias: -13 cm/s
Left CCA dist sys: -58 cm/s
Left CCA prox dias: 16 cm/s
Left CCA prox sys: 62 cm/s
Left ICA dist dias: -21 cm/s
Left ICA dist sys: -77 cm/s
Left ICA prox dias: -26 cm/s
Left ICA prox sys: -103 cm/s
RIGHT ECA DIAS: -6 cm/s
RIGHT VERTEBRAL DIAS: 12 cm/s
Right CCA prox dias: -12 cm/s
Right CCA prox sys: -58 cm/s
Right cca dist sys: -49 cm/s

## 2017-04-25 LAB — COMPREHENSIVE METABOLIC PANEL
ALT: 10 U/L — ABNORMAL LOW (ref 17–63)
AST: 20 U/L (ref 15–41)
Albumin: 4.1 g/dL (ref 3.5–5.0)
Alkaline Phosphatase: 51 U/L (ref 38–126)
Anion gap: 9 (ref 5–15)
BUN: 15 mg/dL (ref 6–20)
CO2: 23 mmol/L (ref 22–32)
Calcium: 9.3 mg/dL (ref 8.9–10.3)
Chloride: 101 mmol/L (ref 101–111)
Creatinine, Ser: 1.12 mg/dL (ref 0.61–1.24)
GFR calc Af Amer: >60 mL/min (ref >60)
GFR calc non Af Amer: >60 mL/min (ref >60)
Glucose, Bld: 104 mg/dL — ABNORMAL HIGH (ref 65–99)
Potassium: 4 mmol/L (ref 3.5–5.1)
Sodium: 133 mmol/L — ABNORMAL LOW (ref 135–145)
Total Bilirubin: 1.1 mg/dL (ref 0.3–1.2)
Total Protein: 7.1 g/dL (ref 6.5–8.1)

## 2017-04-25 LAB — URINALYSIS, ROUTINE W REFLEX MICROSCOPIC
Bilirubin Urine: NEGATIVE
Glucose, UA: NEGATIVE mg/dL
Hgb urine dipstick: NEGATIVE
Ketones, ur: NEGATIVE mg/dL
Leukocytes, UA: NEGATIVE
Nitrite: NEGATIVE
Protein, ur: NEGATIVE mg/dL
Specific Gravity, Urine: 1.009 (ref 1.005–1.030)
pH: 7 (ref 5.0–8.0)

## 2017-04-25 LAB — PROTIME-INR
INR: 1.16
Prothrombin Time: 14.8 seconds (ref 11.4–15.2)

## 2017-04-25 LAB — APTT: aPTT: 33 seconds (ref 24–36)

## 2017-04-25 LAB — ABO/RH: ABO/RH(D): A POS

## 2017-04-25 LAB — SURGICAL PCR SCREEN
MRSA, PCR: NEGATIVE
Staphylococcus aureus: NEGATIVE

## 2017-04-25 MED ORDER — ENOXAPARIN SODIUM 60 MG/0.6ML ~~LOC~~ SOLN: 60.0000 mg | Freq: Two times a day (BID) | SUBCUTANEOUS | 0 refills | Status: DC

## 2017-04-25 MED ORDER — ALBUTEROL SULFATE (2.5 MG/3ML) 0.083% IN NEBU
2.5000 mg | INHALATION_SOLUTION | Freq: Once | RESPIRATORY_TRACT | Status: AC
  Administered 2017-04-25: 2.5 mg via RESPIRATORY_TRACT

## 2017-04-25 NOTE — Progress Notes (Signed)
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 Blood was drawn today his INR is 1.16, he is now off his Coumadin. The time of his heart catheterization he was not bridged with Lovenox, and in fact had his procedure delayed because of elevated pro time.    Patient had replacement of his aortic valve with a  with  27 mm Bjork-Shiley tilting disk valve  27 [id number] in 1983 for Aortic Insufficiency, probability a bicuspid valve. In 2003 echocardiogram noted LV dysfunction 30-35 % and 39 mm aorta just above the cusps and 58 mm at mid ascending aorta. 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 Since the first time at singing his symptoms have markedly improved. He's ambulating without difficulty   He has no known history of MI  I have contacted the [facility] recall  Administrator  and confirmed the serial number of the patients valve is not included in the [facility] CC valve recall in the 1980's. Surgery done by Dr Raenette Rover [facility]     the patient has had cardiac catheterization,   Current Activity/ Functional Status:  Patient is independent with mobility/ambulation, transfers, ADL's, IADL's.   Zubrod Score: At the time of surgery this patient's most appropriate activity status/level should be described as: []     0    Normal  activity, no symptoms [x]     1    Restricted in physical strenuous activity but ambulatory, able to do out light work, patient has difficulty walking using crutches  []     2    Ambulatory and capable of self care, unable to do work activities, up and about               >50 % of waking hours                              []     3    Only limited self care, in bed greater than 50% of waking hours []     4    Completely disabled, no self care, confined to bed or chair []     5    Moribund   Past Medical History:  Diagnosis Date  . AVD (aortic valve disease)   . Chronic anticoagulation   . Dilated cardiomyopathy (HCC)    EF 30-35%  . Dysrhythmia   . Hyperlipidemia   . LV dysfunction     Past Surgical History:  Procedure Laterality Date  . AORTIC VALVE REPLACEMENT    . CORONARY ARTERY BYPASS GRAFT    . DOPPLER ECHOCARDIOGRAPHY  04/07/2002   EF 30-35%  .  RIGHT/LEFT HEART CATH AND CORONARY ANGIOGRAPHY N/A 03/05/2017   Procedure: Right/Left Heart Cath and Coronary Angiography;  Surgeon: Peter M Swaziland, MD;  Location: [facility] INVASIVE CV LAB;  Service: Cardiovascular;  Laterality: N/A;    Family History  Problem Relation Age of Onset  . Heart disease Brother     Social History   Social History  . Marital status: Married    Spouse name: N/A  . Number of children: N/A  . Years of education: N/A   Occupational History  . Not on file.   Social History Main Topics  . Smoking status: Former Smoker    Quit date: 11/12/2000  . Smokeless tobacco: Never Used  . Alcohol use No  . Drug use: No  . Sexual activity: Yes   Other Topics Concern  . Not on file   Social History Narrative  . No narrative on file    History  Smoking Status  . Former Smoker  . Quit date: 11/12/2000  Smokeless Tobacco  . Never Used    History  Alcohol Use No     No Known Allergies  Current Outpatient Prescriptions  Medication Sig Dispense Refill  . colchicine 0.6 MG tablet Take 0.6 mg by mouth daily as  needed.    . methylcellulose (CITRUCEL) oral powder Take 1 packet by mouth 2 (two) times daily.     . carvedilol (COREG) 3.125 MG tablet Take 2 tablets (6.25 mg total) by mouth 2 (two) times daily. (Patient not taking: Reported on 04/25/2017) 180 tablet 3  . losartan (COZAAR) 25 MG tablet Take 1 tablet (25 mg total) by mouth daily. (Patient not taking: Reported on 04/25/2017) 30 tablet 11  . sildenafil (REVATIO) 20 MG tablet TAKE 1-5 TABLETS BY MOUTH AS NEEDED FOR SEXUAL ACTIVITY (Patient not taking: Reported on 04/25/2017) 50 tablet 1  . warfarin (COUMADIN) 5 MG tablet Take 1 tablet by mouth daily or as directed by [facility] (Patient not taking: Reported on 04/25/2017) 90 tablet 1   No current facility-administered medications for this visit.      Review of Systems:  No changes since last visit 4 weeks ago    Cardiac Review of Systems: Y or N  Chest Pain [  n  ]  Resting SOB [ y  ] Exertional SOB  Cove.Etienne  ]  Orthopnea [ y ]   Pedal Edema Cove.Etienne   ]    Palpitations Milo.Brash  ] Syncope  Milo.Brash  ]   Presyncope [ n  ]  General Review of Systems: [Y] = yes [  ]=no Constitional: recent weight change [ n ];  Wt loss over the last 3 months [   ] anorexia [  ]; fatigue Cove.Etienne  ]; nausea [  ]; night sweats [  ]; fever [  ]; or chills [  ];          Dental: poor dentition[  ]; Last Dentist visit:   Eye : blurred vision [  ]; diplopia [   ]; vision changes [  ];  Amaurosis fugax[  ]; Resp: cough [ n ];  wheezing[ n ];  hemoptysis[n  ]; shortness of breath[y  ]; paroxysmal nocturnal dyspnea[y  ]; dyspnea on exertion[y  ]; or orthopnea[ y ];  GI:  gallstones[  ], vomiting[  ];  dysphagia[  ]; melena[  ];  hematochezia [  ]; heartburn[  ];   Hx of  Colonoscopy[  ]; GU: kidney stones [  ]; hematuria[  ];  dysuria [  ];  nocturia[  ];  history of     obstruction [  ]; urinary frequency [  ]             Skin: rash, swelling[  ];, hair loss[  ];  peripheral edema[y  ];  or itching[  ]; Musculosketetal: myalgias[ y ];  joint  swelling[y  ];  joint erythema[  ];  joint pain[  ];  back pain[  ];  Heme/Lymph: bruising[  ];  bleeding[  ];  anemia[  ];  Neuro: TIA[  ];  headaches[  ];  stroke[  ];  vertigo[  ];  seizures[n  ];   paresthesias[  ];  difficulty walking[y  ];  Psych:depression[  ]; anxiety[  ];  Endocrine: diabetes[  ];  thyroid dysfunction[  ];  Immunizations: Flu up to date Milo.Brash  ]; Pneumococcal up to date Milo.Brash  ];  Other:  Physical Exam: BP 112/66 (BP Location: Left Arm, Patient Position: Sitting, Cuff Size: Normal)   Pulse 76   Resp 16   Ht 5\' 10"  (1.778 m)   Wt 173 lb (78.5 kg)   SpO2 94% Comment: RA  BMI 24.82 kg/m   PHYSICAL EXAMINATION: General appearance: alert,no distress Neck: no adenopathy, no carotid bruit, no JVD, supple, symmetrical, trachea midline and thyroid not enlarged, symmetric, no tenderness/mass/nodules Lymph nodes: no palpable Cervical, supraclavicular, and axillary nodes noted. Resp: diminished breath sounds bibasilar Back:  No CVA tenderness. Cardio: regular rate and rhythm, ejection click present and no M of AI GI: soft, non-tender; bowel sounds normal; no masses,  no organomegaly Extremities: edema 2-right greater then left improved since last exam, Homans sign is negative, no sign of DVT, varicose veins noted and venous stasis dermatitis noted Neurologic: Grossly normal Sternum stable and healed   Diagnostic Studies & Laboratory data:     Recent Radiology Findings:   Nm Pet Image Initial (pi) Skull Base To Thigh  Result Date: 02/21/2017 CLINICAL DATA:  Initial treatment strategy for left upper lobe mass. EXAM: NUCLEAR MEDICINE PET SKULL BASE TO THIGH TECHNIQUE: 7.4 mCi F-18 FDG was injected intravenously. Full-ring PET imaging was performed from the skull base to thigh after the radiotracer. CT data was obtained and used for attenuation correction and anatomic localization. FASTING BLOOD GLUCOSE:  Value: 110 mg/dl COMPARISON:  13/24/4010 chest CT FINDINGS: NECK No  hypermetabolic lymph nodes in the neck. Carotid atherosclerotic calcifications. Physiologic symmetric activity in the lower neck strap musculature. CHEST The primarily fatty 2.7 by 2.3 cm left upper lobe suprahilar nodule has a maximum SUV of 2.2. Background mediastinal blood pool activity is 2.2. This nodule is occluding various left upper lobe tracheobronchial branches with the exception of the lingular bronchi. Small mediastinal lymph nodes are not hypermetabolic. Mild airway thickening. Possible small broncholiths in the left lower lobe along with some peripheral airway plugging, unchanged from prior. Ascending thoracic aortic aneurysm. Aortic valve prosthesis. Coronary, aortic arch, and branch vessel atherosclerotic vascular disease. ABDOMEN/PELVIS No abnormal hypermetabolic activity within the liver, pancreas, adrenal glands, or spleen. No hypermetabolic lymph nodes in the abdomen or pelvis. Left scrotal hydrocele. 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 Calcifications in the renal hilum bilaterally are thought to be vascular. Prominent stool throughout the colon favors constipation. SKELETON No focal hypermetabolic activity to suggest skeletal metastasis. Thoracolumbar spondylosis. IMPRESSION: 1. Primarily fatty 2.7 by 2.3 cm left upper lobe suprahilar nodule is not hypermetabolic. 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 However, this does  appear to be obstructing several segmental left upper lobe bronchi. 2. Mild airway thickening. Chronic peripheral airway plugging in parts of the left lower lobe possibly with some small broncholiths. 3. Stable appearance of ascending thoracic aortic aneurysm with aortic valve prosthesis. 4. Other imaging findings of potential clinical significance: Coronary, aortic arch, and branch vessel atherosclerotic vascular disease. Aortoiliac atherosclerotic vascular disease. Prominent stool throughout the colon favors constipation.  Left scrotal hydrocele. 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 Electronically Signed   By: Gaylyn Rong M.D.   On: 02/21/2017 08:53   Ct Angio Chest Aorta W &/or Wo Contrast  Result Date: 02/01/2017 CLINICAL DATA:  Abnormal chest x-ray on 02/28. Former smoker. History of CHF. EXAM: CT ANGIOGRAPHY CHEST WITH CONTRAST TECHNIQUE: Multidetector CT imaging of the chest was performed using the standard protocol during bolus administration of intravenous contrast. Multiplanar CT image reconstructions and MIPs were obtained to evaluate the vascular anatomy. CONTRAST:  100 cc Isovue 300 COMPARISON:  Chest x-ray dated 01/09/2017. FINDINGS: Cardiovascular: Prominent aneurysm of the ascending thoracic aorta, measuring 6.3 cm diameter. Thoracic aortic arch and descending thoracic aorta are normal in caliber. Mild atherosclerotic change. Heart size is normal. No pericardial effusion. Diffuse coronary artery calcifications. Mediastinum/Nodes: No mass or enlarged lymph nodes within the mediastinum. Esophagus appears normal. Trachea and central bronchi are unremarkable. Lungs/Pleura: Mixed density mass, with internal fat density, is seen within the left upper lobe, suprahilar, measuring 3.2 x 2.7 x 2.3 cm, corresponding to the finding on recent chest x-ray. Mild scarring/atelectasis at each lung base.  No pleural effusion. Upper Abdomen: No acute abnormality. Musculoskeletal: Mild degenerative spurring throughout the kyphotic thoracolumbar spine. Median sternotomy wires in place. No acute or suspicious osseous finding. Review of the MIP images confirms the above findings. IMPRESSION: 1. Circumscribed mixed density mass, with partial fat density, located within the central aspects of the left upper lobe, suprahilar, measuring 3.2 cm greatest dimension, corresponding to the finding on recent chest x-ray. 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 As there are no prior studies  for comparison, recommend follow-up chest CT in 3-6 months to ensure stability. 2. Ascending thoracic aortic aneurysm measuring 6.3 cm diameter. Recommend semi-annual imaging followup by CTA or MRA and referral to cardiothoracic surgery if not already obtained. This recommendation follows 2010 ACCF/AHA/AATS/ACR/ASA/SCA/SCAI/SIR/STS/SVM Guidelines for the Diagnosis and Management of Patients With Thoracic Aortic Disease. Circulation. 2010; 121: e266-e369TAA. Aortic valve hardware in place. 3. Heart size is normal.  Diffuse coronary artery calcifications. 4. Aortic atherosclerosis. Electronically Signed   By: Bary Richard M.D.   On: 02/01/2017 16:36     I have independently reviewed the above radiologic studies.  Recent Lab Findings: Lab Results  Component Value Date   WBC 8.2 04/25/2017   HGB 13.6 04/25/2017   HCT 41.0 04/25/2017   PLT 178 04/25/2017   GLUCOSE 104 (H) 04/25/2017   CHOL 199 01/22/2017   TRIG 71 01/22/2017   HDL 64 01/22/2017   LDLCALC 121 (H) 01/22/2017   ALT 10 (L) 04/25/2017   AST 20 04/25/2017   NA 133 (L) 04/25/2017   K 4.0 04/25/2017   CL 101 04/25/2017   CREATININE 1.12 04/25/2017   BUN 15 04/25/2017   CO2 23 04/25/2017   TSH 5.390 (H) 01/22/2017   INR 1.16 04/25/2017   CATH: Procedures   Right/Left Heart Cath and Coronary Angiography  Conclusion     Ost LAD to Prox LAD lesion, 30 %stenosed.  Prox Cx to Mid  Cx lesion, 30 %stenosed.  Mid RCA lesion, 20 %stenosed.   1. Nonobstructive CAD 2. Normal right heart and PCWP 3. Normal cardiac output. 4. By fluoro the AV disc moves well.   Plan: will increase Coreg to 6.25 mg bid and add losartan 25 mg daily for optimal CHF therapy. Plan Aortic grafting for aneurysm.    Indications   Thoracic aortic aneurysm without rupture (HCC) [I71.2 (ICD-10-CM)]  Procedural Details/Technique   Technical Details Indication: 71 yo WM s/p AVR in remote past, dilated CM, and newly diagnosed thoracic aortic  aneurysm.  Procedural Details: The right groin was prepped, draped, and anesthetized with 1% lidocaine. Using the modified Seldinger technique a 5 Fr sheath was placed in the right femoral artery and a 7 French sheath was placed in the right femoral vein. A Swan-Ganz catheter was used for the right heart catheterization. Standard protocol was followed for recording of right heart pressures and sampling of oxygen saturations. Fick cardiac output was calculated. Standard Judkins catheters were used for selective coronary angiography and left ventriculography. There were no immediate procedural complications. The patient was transferred to the post catheterization recovery area for further monitoring.  Contrast: 60 cc   Estimated blood loss <50 mL.  During this procedure no sedation was administered.    Complications   Complications documented before study signed (03/05/2017 2:59 PM EDT)    No complications were associated with this study.  Documented by Swaziland, Peter M, MD - 03/05/2017 2:59 PM EDT    Coronary Findings   Dominance: Right  Left Main  Vessel was injected. Vessel is normal in caliber. Vessel is angiographically normal.  Left Anterior Descending  Ost LAD to Prox LAD lesion, 30% stenosed. The lesion is severely calcified.  Left Circumflex  Prox Cx to Mid Cx lesion, 30% stenosed. The lesion is moderately calcified.  Right Coronary Artery  Vessel is large. The vessel is moderately ectatic.  Mid RCA lesion, 20% stenosed.  Coronary Diagrams   Diagnostic Diagram       Implants     No implant documentation for this case.  PACS Images   Show images for Cardiac catheterization   Link to Procedure Log   Procedure Log    Hemo Data    Most Recent Value  Fick Cardiac Output 4.86 L/min  Fick Cardiac Output Index 2.69 (L/min)/BSA  RA A Wave 5 mmHg  RA V Wave 5 mmHg  RA Mean 3 mmHg  RV Systolic Pressure 29 mmHg  RV Diastolic Pressure 0 mmHg  RV EDP 3 mmHg  PA  Systolic Pressure 28 mmHg  PA Diastolic Pressure 9 mmHg  PA Mean 17 mmHg  PW A Wave 11 mmHg  PW V Wave 9 mmHg  PW Mean 10 mmHg  AO Systolic Pressure 126 mmHg  AO Diastolic Pressure 66 mmHg  AO Mean 90 mmHg  QP/QS 1  TPVR Index 6.34 HRUI  TSVR Index 33.54 HRUI  PVR SVR Ratio 0.08  TPVR/TSVR Ratio 0.19      [facility]: [facility]*                  *[facility]                          [address]                        [place], [place] [address]  [phone]  ------------------------------------------------------------------- Echocardiography  Patient:    Lesslie, Mckeehan MR #:       [id number] Study Date: 02/08/2017 Gender:     M Age:        71 Height:     177.8 cm Weight:     64.8 kg BSA:        1.78 m^2 Pt. Status: Room:   ATTENDING    Peter Swaziland, M.D.  ORDERING     Peter [place], M.D.  REFERRING    Peter [place], M.D.  SONOGRAPHER  Nolon Rod, RDCS  PERFORMING   [facility], Outpatient  cc:  ------------------------------------------------------------------- LV EF: 25% -   30%  ------------------------------------------------------------------- Indications:      Aortic stenosis 424.1.  ------------------------------------------------------------------- History:   PMH:   Aortic valve disease.  Risk factors:  Former tobacco use.  ------------------------------------------------------------------- Study Conclusions  - Left ventricle: The cavity size was mildly dilated. Wall   thickness was normal. Systolic function was severely reduced. The   estimated ejection fraction was in the range of 25% to 30%.   Diffuse hypokinesis. Doppler parameters are consistent with   abnormal left ventricular relaxation (grade 1 diastolic   dysfunction). - Aortic valve: Mechanical aortic valve, well seated Vickki Hearing). Peak   velocity (S): 142 cm/s. - Aorta: CT scan report 6.3cm aneursym of thoracic aorta. Aortic   root  dimension: 42 mm (ED) at the sinus of Valsalva and 6.65cm   ascending root. - Ascending aorta: The ascending aorta was severely dilated. - Left atrium: The atrium was mildly dilated. Volume/bsa, S: 40.8   ml/m^2. - Pulmonary arteries: Systolic pressure was mildly increased. PA   peak pressure: 38 mm Hg (S).  Recommendations:  This procedure has been discussed with the referring physician.  ------------------------------------------------------------------- Labs, prior tests, procedures, and surgery: Aortic valve replacement with a mechanical valve.  ------------------------------------------------------------------- Study data:   Study status:  Routine.  Study completion:  There were no complications.          Echocardiography.  M-mode, complete 2D, spectral Doppler, and color Doppler.  Birthdate:  Patient birthdate: 1946-09-14.  Age:  Patient is 71 yr old.  Sex:  Gender: male.    BMI: 20.5 kg/m^2.  Blood pressure:     106/60  Patient status:  Outpatient.  Study date:  Study date: 02/08/2017. Study time: 10:50 AM.  Location:  [facility].  -------------------------------------------------------------------  ------------------------------------------------------------------- Left ventricle:  The cavity size was mildly dilated. Wall thickness was normal. Systolic function was severely reduced. The estimated ejection fraction was in the range of 25% to 30%. Diffuse hypokinesis. Doppler parameters are consistent with abnormal left ventricular relaxation (grade 1 diastolic dysfunction).  ------------------------------------------------------------------- Aortic valve:  Mechanical aortic valve, well seated Vickki Hearing). Doppler:  Transvalvular velocity was within the normal range. There was no stenosis. There was no regurgitation.    VTI ratio of LVOT to aortic valve: 1.02. Peak velocity ratio of LVOT to aortic valve: 0.97. Mean velocity ratio of LVOT to aortic valve: 1.03.     Mean gradient (S): 5 mm Hg. Peak gradient (S): 8 mm Hg.  ------------------------------------------------------------------- Aorta:  Ascending aorta: The ascending aorta was severely dilated.  ------------------------------------------------------------------- Mitral valve:   Structurally normal valve.   Leaflet separation was normal.  Doppler:  Transvalvular velocity was within the normal range. There was no evidence for stenosis. There was no regurgitation.  ------------------------------------------------------------------- Left atrium:  The atrium was mildly dilated.  ------------------------------------------------------------------- Right ventricle:  The cavity size was normal. Wall thickness was  normal. Systolic function was normal.  ------------------------------------------------------------------- Pulmonic valve:   Poorly visualized.  Structurally normal valve. Cusp separation was normal.  Doppler:  Transvalvular velocity was within the normal range. There was trivial regurgitation.  ------------------------------------------------------------------- Tricuspid valve:   Structurally normal valve.   Leaflet separation was normal.  Doppler:  Transvalvular velocity was within the normal range. There was mild regurgitation.  ------------------------------------------------------------------- Pulmonary artery:   The main pulmonary artery was normal-sized. Systolic pressure was mildly increased.  ------------------------------------------------------------------- Right atrium:  The atrium was normal in size.  ------------------------------------------------------------------- Pericardium:  The pericardium was normal in appearance.  ------------------------------------------------------------------- Systemic veins: Inferior vena cava: The vessel was dilated. The respirophasic diameter changes were blunted (< 50%), consistent with elevated central venous  pressure.  ------------------------------------------------------------------- Measurements   Left ventricle                          Value        Reference  LV ID, ED, PLAX chordal          (H)    63.5  mm     43 - 52  LV ID, ES, PLAX chordal          (H)    58    mm     23 - 38  LV fx shortening, PLAX chordal   (L)    9     %      >=29  LV PW thickness, ED                     8.48  mm     ----------  IVS/LV PW ratio, ED                     0.94         <=1.3  LV ejection fraction, 1-p A4C           44    %      ----------  LV end-diastolic volume, 2-p            199   ml     ----------  LV end-systolic volume, 2-p             119   ml     ----------  LV ejection fraction, 2-p               40    %      ----------  Stroke volume, 2-p                      80    ml     ----------  LV end-diastolic volume/bsa, 2-p        112   ml/m^2 ----------  LV end-systolic volume/bsa, 2-p         67    ml/m^2 ----------  Stroke volume/bsa, 2-p                  44.9  ml/m^2 ----------  LV e&', lateral                          8.92  cm/s   ----------  LV E/e&', lateral                        7.78         ----------  LV e&',  medial                           5     cm/s   ----------  LV E/e&', medial                         13.88        ----------  LV e&', average                          6.96  cm/s   ----------  LV E/e&', average                        9.97         ----------  Longitudinal strain, TDI                17    %      ----------    Ventricular septum                      Value        Reference  IVS thickness, ED                       7.94  mm     ----------    LVOT                                    Value        Reference  LVOT peak velocity, S                   138   cm/s   ----------  LVOT mean velocity, S                   107   cm/s   ----------  LVOT VTI, S                             29.2  cm     ----------  LVOT peak gradient, S                   8     mm Hg  ----------     Aortic valve                            Value        Reference  Aortic valve peak velocity, S           142   cm/s   ----------  Aortic valve mean velocity, S           104   cm/s   ----------  Aortic valve VTI, S                     28.5  cm     ----------  Aortic mean gradient, S                 5     mm Hg  ----------  Aortic peak gradient, S                 8  mm Hg  ----------  VTI ratio, LVOT/AV                      1.02         ----------  Velocity ratio, peak, LVOT/AV           0.97         ----------  Velocity ratio, mean, LVOT/AV           1.03         ----------    Aorta                                   Value        Reference  Aortic root ID, ED                      42    mm     ----------    Left atrium                             Value        Reference  LA ID, A-P, ES                          38    mm     ----------  LA ID/bsa, A-P                          2.13  cm/m^2 <=2.2  LA volume, S                            72.7  ml     ----------  LA volume/bsa, S                        40.8  ml/m^2 ----------  LA volume, ES, 1-p A4C                  59.2  ml     ----------  LA volume/bsa, ES, 1-p A4C              33.2  ml/m^2 ----------  LA volume, ES, 1-p A2C                  85.8  ml     ----------  LA volume/bsa, ES, 1-p A2C              48.1  ml/m^2 ----------    Mitral valve                            Value        Reference  Mitral E-wave peak velocity             69.4  cm/s   ----------  Mitral A-wave peak velocity             68.4  cm/s   ----------  Mitral deceleration time                201   ms     150 - 230  Mitral E/A ratio, peak  1            ----------  Mitral regurg VTI, PISA                 188   cm     ----------    Pulmonary arteries                      Value        Reference  PA pressure, S, DP               (H)    38    mm Hg  <=30    Tricuspid valve                         Value        Reference  Tricuspid regurg peak velocity           238   cm/s   ----------  Tricuspid peak RV-RA gradient           23    mm Hg  ----------    Right atrium                            Value        Reference  RA ID, S-I, ES, A4C              (H)    52    mm     34 - 49  RA area, ES, A4C                        18.2  cm^2   8.3 - 19.5  RA volume, ES, A/L                      53    ml     ----------  RA volume/bsa, ES, A/L                  29.7  ml/m^2 ----------    Systemic veins                          Value        Reference  Estimated CVP                           15    mm Hg  ----------    Right ventricle                         Value        Reference  RV pressure, S, DP               (H)    38    mm Hg  <=30  RV s&', lateral, S                       9.14  cm/s   ----------  Legend: (L)  and  (H)  mark values outside specified reference range.  ------------------------------------------------------------------- Prepared and Electronically Authenticated by  Donato Schultz, M.D. 2018-03-30T12:37:39   Aortic Size Index=   6.3      /Body surface area is 1.97 meters squared. 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 The estimated ejection fraction was in the range of 25% to 30%. Stage C, Class III     Diffuse hypokinesis. Doppler parameters indicate  abnormal left ventricular relaxation (grade 1 diastolic dysfunction). 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 I had recommended to him that if we needed to replace his aortic valve with the ascending aorta, with his current age it would seem reasonable to put a tissue valve  so he is not committed to Coumadin the remainder of his life.  He notes that he is not opposed to Coumadin but is willing to have a tissue valve should we require replacement.    the patient and his wife are aware of the high risk nature of this suture with the redo status, his poor underlying ventricular function. Risks of death infection stroke myocardial infarction bleeding blood transfusion and need for permanent pacemaker have all been discussed.  I have contacted the [facility] recall  Administrator  and confirmed the serial number of the patients valve is not included in the [facility] CC valve recall in the 1980's   Patient is INR is now acting normal he will be seen in the Coumadin clinic today and started on Lovenox bridge pending surgery on Monday morning. June 18  [name] MD      [address] [place] [address] Office [phone]   Beeper [phone]  04/25/2017 2:41 PM

## 2017-04-25 NOTE — Progress Notes (Signed)
Pre-op Cardiac Surgery  Carotid Findings:  Bilateral:  1-39% ICA stenosis.  Vertebral artery flow is antegrade.     Upper Extremity Right Left  Brachial Pressures 113 Triphasic 109 Triphasic  Radial Waveforms Triphasic Triphasic  Ulnar Waveforms Triphasic Triphasic  Palmar Arch (Allen's Test) Normal Normal   Findings:  Doppler waveforms remained normal bilaterally with both radial and ulnar compressions.    Lower  Extremity Right Left  Dorsalis Pedis 139 Triphasic 126 Triphasic  Posterior Tibial 113 Triphasic 119 Triphasic  Ankle/Brachial Indices 1.23 1.12    Findings:  ABIs and Doppler waveforms indicate normal arterial flow bilaterally at rest.  Toma Deiters, RVS 04/25/2017 12:25 PM

## 2017-04-25 NOTE — Patient Instructions (Addendum)
04/25/17 - Inject Lovenox 60 mg into the fatty tissue in your abdomen at 7pm tonight. No warfarin tonight.  04/26/17 - Inject Lovenox 60 mg into the fatty tissue in your abdomen at 7am and 7pm. No warfarin. 04/27/17 - Inject Lovenox 60 mg into the fatty tissue in your abdomen at 7am and 7pm. No warfarin. 04/28/17 - Inject Lovenox 60 mg into the fatty tissue in your abdomen at 7am ONLY. No warfarin. 04/29/17 - Day of Procedure. No Lovenox today. Warfarin tonight. 04/30/17 - Inject Lovenox 60 mg into the fatty tissue in your abdomen at 7am and 7pm. Warfarin tonight. 05/01/17 - Inject Lovenox 60 mg into the fatty tissue in your abdomen at 7am and 7pm. Warfarin tonight. 05/02/17 - Inject Lovenox 60 mg into the fatty tissue in your abdomen at 7am and 7pm. Warfarin tonight. 05/03/17 - INR check today.

## 2017-04-26 LAB — HEMOGLOBIN A1C
Hgb A1c MFr Bld: 5.7 % — ABNORMAL HIGH (ref 4.8–5.6)
Mean Plasma Glucose: 117 mg/dL

## 2017-04-28 MED ORDER — EPINEPHRINE PF 1 MG/ML IJ SOLN
0.0000 ug/min | INTRAVENOUS | Status: DC
  Filled 2017-04-28: qty 4

## 2017-04-28 MED ORDER — SODIUM CHLORIDE 0.9 % IV SOLN
30.0000 ug/min | INTRAVENOUS | Status: DC
  Filled 2017-04-28 (×3): qty 2

## 2017-04-28 MED ORDER — POTASSIUM CHLORIDE 2 MEQ/ML IV SOLN
80.0000 meq | INTRAVENOUS | Status: DC
Start: 2017-04-29 — End: 2017-04-29
  Filled 2017-04-28: qty 40

## 2017-04-28 MED ORDER — TRANEXAMIC ACID (OHS) BOLUS VIA INFUSION
15.0000 mg/kg | INTRAVENOUS | Status: AC
  Administered 2017-04-29: 1177.5 mg via INTRAVENOUS
  Filled 2017-04-28: qty 1178

## 2017-04-28 MED ORDER — MAGNESIUM SULFATE 50 % IJ SOLN
40.0000 meq | INTRAMUSCULAR | Status: DC
Start: 2017-04-29 — End: 2017-04-29
  Filled 2017-04-28: qty 10

## 2017-04-28 MED ORDER — SODIUM CHLORIDE 0.9 % IV SOLN
INTRAVENOUS | Status: AC
  Administered 2017-04-29: .8 [IU]/h via INTRAVENOUS
  Filled 2017-04-28: qty 1

## 2017-04-28 MED ORDER — DEXMEDETOMIDINE HCL IN NACL 400 MCG/100ML IV SOLN
0.1000 ug/kg/h | INTRAVENOUS | Status: AC
  Administered 2017-04-29: .3 ug/kg/h via INTRAVENOUS
  Filled 2017-04-28: qty 100

## 2017-04-28 MED ORDER — VANCOMYCIN HCL 10 G IV SOLR
1250.0000 mg | INTRAVENOUS | Status: AC
  Administered 2017-04-29: 1250 mg via INTRAVENOUS
  Filled 2017-04-28: qty 1250

## 2017-04-28 MED ORDER — DOPAMINE-DEXTROSE 3.2-5 MG/ML-% IV SOLN
0.0000 ug/kg/min | INTRAVENOUS | Status: AC
  Administered 2017-04-29: 3 ug/kg/min via INTRAVENOUS
  Filled 2017-04-28: qty 250

## 2017-04-28 MED ORDER — TRANEXAMIC ACID (OHS) PUMP PRIME SOLUTION
2.0000 mg/kg | INTRAVENOUS | Status: DC
  Filled 2017-04-28: qty 1.57

## 2017-04-28 MED ORDER — DEXTROSE 5 % IV SOLN
1.5000 g | INTRAVENOUS | Status: AC
  Administered 2017-04-29: .75 g via INTRAVENOUS
  Administered 2017-04-29: 1.5 g via INTRAVENOUS
  Filled 2017-04-28: qty 1.5

## 2017-04-28 MED ORDER — DEXTROSE 5 % IV SOLN
750.0000 mg | INTRAVENOUS | Status: DC
  Filled 2017-04-28: qty 750

## 2017-04-28 MED ORDER — TRANEXAMIC ACID 1000 MG/10ML IV SOLN
1.5000 mg/kg/h | INTRAVENOUS | Status: AC
  Administered 2017-04-29: 1.5 mg/kg/h via INTRAVENOUS
  Filled 2017-04-28: qty 25

## 2017-04-28 MED ORDER — PLASMA-LYTE 148 IV SOLN
INTRAVENOUS | Status: AC
  Administered 2017-04-29: 500 mL
  Filled 2017-04-28: qty 2.5

## 2017-04-28 MED ORDER — NITROGLYCERIN IN D5W 200-5 MCG/ML-% IV SOLN
2.0000 ug/min | INTRAVENOUS | Status: DC
  Filled 2017-04-28: qty 250

## 2017-04-28 MED ORDER — SODIUM CHLORIDE 0.9 % IV SOLN
INTRAVENOUS | Status: DC
  Filled 2017-04-28: qty 30

## 2017-04-28 NOTE — Anesthesia Preprocedure Evaluation (Addendum)
Anesthesia Evaluation  Patient identified by MRN, date of birth, ID band Patient awake    Reviewed: Allergy & Precautions, H&P , NPO status , Patient's Chart, lab work & pertinent test results  Airway Mallampati: I  TM Distance: >3 FB Neck ROM: Full    Dental no notable dental hx. (+) Edentulous Upper, Edentulous Lower, Dental Advisory Given   Pulmonary neg pulmonary ROS, former smoker,    Pulmonary exam normal breath sounds clear to auscultation       Cardiovascular Exercise Tolerance: Good hypertension, + CAD, + CABG, + Peripheral Vascular Disease and +CHF   Rhythm:Regular Rate:Normal     Neuro/Psych negative neurological ROS  negative psych ROS   GI/Hepatic negative GI ROS, Neg liver ROS,   Endo/Other  negative endocrine ROS  Renal/GU negative Renal ROS  negative genitourinary   Musculoskeletal   Abdominal   Peds  Hematology negative hematology ROS (+)   Anesthesia Other Findings   Reproductive/Obstetrics negative OB ROS                            Anesthesia Physical Anesthesia Plan  ASA: IV  Anesthesia Plan: General   Post-op Pain Management:    Induction: Intravenous  PONV Risk Score and Plan: 2 and Ondansetron, Propofol, Midazolam and Treatment may vary due to age or medical condition  Airway Management Planned: Oral ETT  Additional Equipment: Arterial line, CVP, PA Cath, TEE and Ultrasound Guidance Line Placement  Intra-op Plan:   Post-operative Plan: Post-operative intubation/ventilation  Informed Consent: I have reviewed the patients History and Physical, chart, labs and discussed the procedure including the risks, benefits and alternatives for the proposed anesthesia with the patient or authorized representative who has indicated his/her understanding and acceptance.   Dental advisory given  Plan Discussed with: CRNA  Anesthesia Plan Comments:         Anesthesia Quick Evaluation

## 2017-04-29 ENCOUNTER — Inpatient Hospital Stay (HOSPITAL_COMMUNITY): Payer: Medicare Other | Admitting: Emergency Medicine

## 2017-04-29 ENCOUNTER — Encounter (HOSPITAL_COMMUNITY): Payer: Self-pay | Admitting: *Deleted

## 2017-04-29 ENCOUNTER — Inpatient Hospital Stay (HOSPITAL_COMMUNITY)
Admission: RE | Admit: 2017-04-29 | Discharge: 2017-05-23 | DRG: 219 | Disposition: A | Discharge status: Rehabilitation Facility | Payer: Medicare Other | Source: Ambulatory Visit | Attending: Cardiothoracic Surgery | Admitting: Cardiothoracic Surgery

## 2017-04-29 ENCOUNTER — Inpatient Hospital Stay (HOSPITAL_COMMUNITY): Payer: Medicare Other | Admitting: Certified Registered"

## 2017-04-29 ENCOUNTER — Inpatient Hospital Stay (HOSPITAL_COMMUNITY)
Admission: RE | Disposition: A | Discharge status: Rehabilitation Facility | Payer: Self-pay | Source: Ambulatory Visit | Attending: Cardiothoracic Surgery

## 2017-04-29 ENCOUNTER — Inpatient Hospital Stay (HOSPITAL_COMMUNITY): Payer: Medicare Other

## 2017-04-29 DIAGNOSIS — Q678 Other congenital deformities of chest: Secondary | ICD-10-CM

## 2017-04-29 DIAGNOSIS — S225XXA Flail chest, initial encounter for closed fracture: Secondary | ICD-10-CM | POA: Diagnosis not present

## 2017-04-29 DIAGNOSIS — I4581 Long QT syndrome: Secondary | ICD-10-CM | POA: Diagnosis not present

## 2017-04-29 DIAGNOSIS — D62 Acute posthemorrhagic anemia: Secondary | ICD-10-CM | POA: Diagnosis not present

## 2017-04-29 DIAGNOSIS — Z951 Presence of aortocoronary bypass graft: Secondary | ICD-10-CM | POA: Diagnosis not present

## 2017-04-29 DIAGNOSIS — Z8249 Family history of ischemic heart disease and other diseases of the circulatory system: Secondary | ICD-10-CM

## 2017-04-29 DIAGNOSIS — R570 Cardiogenic shock: Secondary | ICD-10-CM | POA: Diagnosis not present

## 2017-04-29 DIAGNOSIS — I5043 Acute on chronic combined systolic (congestive) and diastolic (congestive) heart failure: Secondary | ICD-10-CM | POA: Diagnosis not present

## 2017-04-29 DIAGNOSIS — R0902 Hypoxemia: Secondary | ICD-10-CM | POA: Diagnosis not present

## 2017-04-29 DIAGNOSIS — J90 Pleural effusion, not elsewhere classified: Secondary | ICD-10-CM | POA: Diagnosis not present

## 2017-04-29 DIAGNOSIS — G9340 Encephalopathy, unspecified: Secondary | ICD-10-CM

## 2017-04-29 DIAGNOSIS — I77819 Aortic ectasia, unspecified site: Secondary | ICD-10-CM

## 2017-04-29 DIAGNOSIS — Z87891 Personal history of nicotine dependence: Secondary | ICD-10-CM | POA: Diagnosis not present

## 2017-04-29 DIAGNOSIS — Z9689 Presence of other specified functional implants: Secondary | ICD-10-CM

## 2017-04-29 DIAGNOSIS — I10 Essential (primary) hypertension: Secondary | ICD-10-CM | POA: Diagnosis not present

## 2017-04-29 DIAGNOSIS — J9811 Atelectasis: Secondary | ICD-10-CM

## 2017-04-29 DIAGNOSIS — Y95 Nosocomial condition: Secondary | ICD-10-CM | POA: Diagnosis not present

## 2017-04-29 DIAGNOSIS — S2249XA Multiple fractures of ribs, unspecified side, initial encounter for closed fracture: Secondary | ICD-10-CM | POA: Diagnosis not present

## 2017-04-29 DIAGNOSIS — J9600 Acute respiratory failure, unspecified whether with hypoxia or hypercapnia: Secondary | ICD-10-CM

## 2017-04-29 DIAGNOSIS — Z7901 Long term (current) use of anticoagulants: Secondary | ICD-10-CM

## 2017-04-29 DIAGNOSIS — R740 Nonspecific elevation of levels of transaminase and lactic acid dehydrogenase [LDH]: Secondary | ICD-10-CM | POA: Diagnosis not present

## 2017-04-29 DIAGNOSIS — I97120 Postprocedural cardiac arrest following cardiac surgery: Secondary | ICD-10-CM | POA: Diagnosis not present

## 2017-04-29 DIAGNOSIS — Z938 Other artificial opening status: Secondary | ICD-10-CM

## 2017-04-29 DIAGNOSIS — I712 Thoracic aortic aneurysm, without rupture: Secondary | ICD-10-CM | POA: Diagnosis present

## 2017-04-29 DIAGNOSIS — J440 Chronic obstructive pulmonary disease with acute lower respiratory infection: Secondary | ICD-10-CM | POA: Diagnosis present

## 2017-04-29 DIAGNOSIS — R1310 Dysphagia, unspecified: Secondary | ICD-10-CM | POA: Diagnosis not present

## 2017-04-29 DIAGNOSIS — J9382 Other air leak: Secondary | ICD-10-CM | POA: Diagnosis not present

## 2017-04-29 DIAGNOSIS — Z6820 Body mass index (BMI) 20.0-20.9, adult: Secondary | ICD-10-CM

## 2017-04-29 DIAGNOSIS — R410 Disorientation, unspecified: Secondary | ICD-10-CM | POA: Diagnosis not present

## 2017-04-29 DIAGNOSIS — Z954 Presence of other heart-valve replacement: Secondary | ICD-10-CM | POA: Diagnosis not present

## 2017-04-29 DIAGNOSIS — E872 Acidosis: Secondary | ICD-10-CM | POA: Diagnosis not present

## 2017-04-29 DIAGNOSIS — Z01818 Encounter for other preprocedural examination: Secondary | ICD-10-CM

## 2017-04-29 DIAGNOSIS — R040 Epistaxis: Secondary | ICD-10-CM | POA: Diagnosis not present

## 2017-04-29 DIAGNOSIS — I472 Ventricular tachycardia, unspecified: Secondary | ICD-10-CM

## 2017-04-29 DIAGNOSIS — Z952 Presence of prosthetic heart valve: Secondary | ICD-10-CM

## 2017-04-29 DIAGNOSIS — I5022 Chronic systolic (congestive) heart failure: Secondary | ICD-10-CM | POA: Diagnosis not present

## 2017-04-29 DIAGNOSIS — J939 Pneumothorax, unspecified: Secondary | ICD-10-CM

## 2017-04-29 DIAGNOSIS — J189 Pneumonia, unspecified organism: Secondary | ICD-10-CM | POA: Diagnosis not present

## 2017-04-29 DIAGNOSIS — R739 Hyperglycemia, unspecified: Secondary | ICD-10-CM | POA: Diagnosis not present

## 2017-04-29 DIAGNOSIS — Z95828 Presence of other vascular implants and grafts: Secondary | ICD-10-CM

## 2017-04-29 DIAGNOSIS — I5023 Acute on chronic systolic (congestive) heart failure: Secondary | ICD-10-CM | POA: Diagnosis not present

## 2017-04-29 DIAGNOSIS — T859XXA Unspecified complication of internal prosthetic device, implant and graft, initial encounter: Secondary | ICD-10-CM

## 2017-04-29 DIAGNOSIS — R918 Other nonspecific abnormal finding of lung field: Secondary | ICD-10-CM | POA: Diagnosis present

## 2017-04-29 DIAGNOSIS — Z978 Presence of other specified devices: Secondary | ICD-10-CM

## 2017-04-29 DIAGNOSIS — Z4659 Encounter for fitting and adjustment of other gastrointestinal appliance and device: Secondary | ICD-10-CM

## 2017-04-29 DIAGNOSIS — Z9889 Other specified postprocedural states: Secondary | ICD-10-CM

## 2017-04-29 DIAGNOSIS — I9789 Other postprocedural complications and disorders of the circulatory system, not elsewhere classified: Secondary | ICD-10-CM | POA: Diagnosis not present

## 2017-04-29 DIAGNOSIS — R1312 Dysphagia, oropharyngeal phase: Secondary | ICD-10-CM | POA: Diagnosis not present

## 2017-04-29 DIAGNOSIS — I420 Dilated cardiomyopathy: Secondary | ICD-10-CM | POA: Diagnosis present

## 2017-04-29 DIAGNOSIS — N179 Acute kidney failure, unspecified: Secondary | ICD-10-CM | POA: Diagnosis not present

## 2017-04-29 DIAGNOSIS — J449 Chronic obstructive pulmonary disease, unspecified: Secondary | ICD-10-CM | POA: Diagnosis present

## 2017-04-29 DIAGNOSIS — I5021 Acute systolic (congestive) heart failure: Secondary | ICD-10-CM | POA: Diagnosis not present

## 2017-04-29 DIAGNOSIS — T17990A Other foreign object in respiratory tract, part unspecified in causing asphyxiation, initial encounter: Secondary | ICD-10-CM | POA: Diagnosis not present

## 2017-04-29 DIAGNOSIS — E46 Unspecified protein-calorie malnutrition: Secondary | ICD-10-CM | POA: Diagnosis present

## 2017-04-29 DIAGNOSIS — I2510 Atherosclerotic heart disease of native coronary artery without angina pectoris: Secondary | ICD-10-CM | POA: Diagnosis present

## 2017-04-29 DIAGNOSIS — I469 Cardiac arrest, cause unspecified: Secondary | ICD-10-CM | POA: Diagnosis not present

## 2017-04-29 DIAGNOSIS — J982 Interstitial emphysema: Secondary | ICD-10-CM | POA: Diagnosis not present

## 2017-04-29 DIAGNOSIS — Z8674 Personal history of sudden cardiac arrest: Secondary | ICD-10-CM | POA: Diagnosis not present

## 2017-04-29 DIAGNOSIS — R791 Abnormal coagulation profile: Secondary | ICD-10-CM | POA: Diagnosis not present

## 2017-04-29 DIAGNOSIS — I340 Nonrheumatic mitral (valve) insufficiency: Secondary | ICD-10-CM | POA: Diagnosis not present

## 2017-04-29 DIAGNOSIS — Z09 Encounter for follow-up examination after completed treatment for conditions other than malignant neoplasm: Secondary | ICD-10-CM

## 2017-04-29 DIAGNOSIS — I360 Nonrheumatic tricuspid (valve) stenosis: Secondary | ICD-10-CM | POA: Diagnosis not present

## 2017-04-29 DIAGNOSIS — E871 Hypo-osmolality and hyponatremia: Secondary | ICD-10-CM | POA: Diagnosis not present

## 2017-04-29 DIAGNOSIS — J9601 Acute respiratory failure with hypoxia: Secondary | ICD-10-CM

## 2017-04-29 DIAGNOSIS — J95811 Postprocedural pneumothorax: Secondary | ICD-10-CM | POA: Diagnosis not present

## 2017-04-29 DIAGNOSIS — R5381 Other malaise: Secondary | ICD-10-CM | POA: Diagnosis not present

## 2017-04-29 DIAGNOSIS — R1314 Dysphagia, pharyngoesophageal phase: Secondary | ICD-10-CM | POA: Diagnosis not present

## 2017-04-29 HISTORY — PX: TEE WITHOUT CARDIOVERSION: SHX5443

## 2017-04-29 HISTORY — PX: BENTALL PROCEDURE: SHX5058

## 2017-04-29 LAB — POCT I-STAT, CHEM 8
BUN: 15 mg/dL (ref 6–20)
BUN: 14 mg/dL (ref 6–20)
BUN: 12 mg/dL (ref 6–20)
BUN: 15 mg/dL (ref 6–20)
BUN: 15 mg/dL (ref 6–20)
BUN: 13 mg/dL (ref 6–20)
Calcium, Ion: 1.3 mmol/L (ref 1.15–1.40)
Calcium, Ion: 1.33 mmol/L (ref 1.15–1.40)
Calcium, Ion: 1.04 mmol/L — ABNORMAL LOW (ref 1.15–1.40)
Calcium, Ion: 1.1 mmol/L — ABNORMAL LOW (ref 1.15–1.40)
Calcium, Ion: 1.16 mmol/L (ref 1.15–1.40)
Calcium, Ion: 1.27 mmol/L (ref 1.15–1.40)
Chloride: 99 mmol/L — ABNORMAL LOW (ref 101–111)
Chloride: 96 mmol/L — ABNORMAL LOW (ref 101–111)
Chloride: 98 mmol/L — ABNORMAL LOW (ref 101–111)
Chloride: 100 mmol/L — ABNORMAL LOW (ref 101–111)
Chloride: 100 mmol/L — ABNORMAL LOW (ref 101–111)
Chloride: 99 mmol/L — ABNORMAL LOW (ref 101–111)
Creatinine, Ser: 0.8 mg/dL (ref 0.61–1.24)
Creatinine, Ser: 0.8 mg/dL (ref 0.61–1.24)
Creatinine, Ser: 0.6 mg/dL — ABNORMAL LOW (ref 0.61–1.24)
Creatinine, Ser: 0.7 mg/dL (ref 0.61–1.24)
Creatinine, Ser: 0.8 mg/dL (ref 0.61–1.24)
Creatinine, Ser: 0.6 mg/dL — ABNORMAL LOW (ref 0.61–1.24)
Glucose, Bld: 99 mg/dL (ref 65–99)
Glucose, Bld: 94 mg/dL (ref 65–99)
Glucose, Bld: 85 mg/dL (ref 65–99)
Glucose, Bld: 111 mg/dL — ABNORMAL HIGH (ref 65–99)
Glucose, Bld: 153 mg/dL — ABNORMAL HIGH (ref 65–99)
Glucose, Bld: 155 mg/dL — ABNORMAL HIGH (ref 65–99)
HCT: 31 % — ABNORMAL LOW (ref 39.0–52.0)
HCT: 31 % — ABNORMAL LOW (ref 39.0–52.0)
HCT: 26 % — ABNORMAL LOW (ref 39.0–52.0)
HCT: 26 % — ABNORMAL LOW (ref 39.0–52.0)
HCT: 28 % — ABNORMAL LOW (ref 39.0–52.0)
HCT: 26 % — ABNORMAL LOW (ref 39.0–52.0)
Hemoglobin: 10.5 g/dL — ABNORMAL LOW (ref 13.0–17.0)
Hemoglobin: 10.5 g/dL — ABNORMAL LOW (ref 13.0–17.0)
Hemoglobin: 8.8 g/dL — ABNORMAL LOW (ref 13.0–17.0)
Hemoglobin: 8.8 g/dL — ABNORMAL LOW (ref 13.0–17.0)
Hemoglobin: 9.5 g/dL — ABNORMAL LOW (ref 13.0–17.0)
Hemoglobin: 8.8 g/dL — ABNORMAL LOW (ref 13.0–17.0)
Potassium: 4.4 mmol/L (ref 3.5–5.1)
Potassium: 4.2 mmol/L (ref 3.5–5.1)
Potassium: 4.3 mmol/L (ref 3.5–5.1)
Potassium: 5.2 mmol/L — ABNORMAL HIGH (ref 3.5–5.1)
Potassium: 5.4 mmol/L — ABNORMAL HIGH (ref 3.5–5.1)
Potassium: 4.1 mmol/L (ref 3.5–5.1)
Sodium: 139 mmol/L (ref 135–145)
Sodium: 139 mmol/L (ref 135–145)
Sodium: 138 mmol/L (ref 135–145)
Sodium: 134 mmol/L — ABNORMAL LOW (ref 135–145)
Sodium: 135 mmol/L (ref 135–145)
Sodium: 137 mmol/L (ref 135–145)
TCO2: 31 mmol/L (ref 0–100)
TCO2: 27 mmol/L (ref 0–100)
TCO2: 28 mmol/L (ref 0–100)
TCO2: 29 mmol/L (ref 0–100)
TCO2: 29 mmol/L (ref 0–100)
TCO2: 29 mmol/L (ref 0–100)

## 2017-04-29 LAB — PLATELET COUNT: Platelets: 138 10*3/uL — ABNORMAL LOW (ref 150–400)

## 2017-04-29 LAB — CBC
HCT: 33.6 % — ABNORMAL LOW (ref 39.0–52.0)
HEMOGLOBIN: 11 g/dL — AB (ref 13.0–17.0)
MCH: 30.6 pg (ref 26.0–34.0)
MCHC: 32.7 g/dL (ref 30.0–36.0)
MCV: 93.6 fL (ref 78.0–100.0)
Platelets: 132 10*3/uL — ABNORMAL LOW (ref 150–400)
RBC: 3.59 MIL/uL — ABNORMAL LOW (ref 4.22–5.81)
RDW: 14.3 % (ref 11.5–15.5)
WBC: 18.4 10*3/uL — ABNORMAL HIGH (ref 4.0–10.5)

## 2017-04-29 LAB — ECHO TEE
AO mean calculated velocity dopler: 85.5 cm/s
AV Area VTI index: 2.78 cm2/m2
AV Area VTI: 5.6 cm2
AV Area mean vel: 5.84 cm2
AV Mean grad: 4 mmHg
AV Peak grad: 7 mmHg
AV VEL mean LVOT/AV: 0.95
AV area mean vel ind: 2.98 cm2/m2
AV peak Index: 2.86
AV pk vel: 133 cm/s
AV vel: 5.44
Ao pk vel: 0.91 m/s
FS: 23 % — AB (ref 28–44)
LVOT SV: 180 mL
LVOT VTI: 29.3 cm
LVOT area: 6.16 cm2
LVOT diameter: 28 mm
LVOT peak VTI: 0.88 cm
LVOT peak grad rest: 6 mmHg
LVOT peak vel: 121 cm/s
VTI: 33.2 cm
Valve area index: 2.78
Valve area: 5.44 cm2

## 2017-04-29 LAB — POCT I-STAT 3, ART BLOOD GAS (G3+)
Acid-Base Excess: 4 mmol/L — ABNORMAL HIGH (ref 0.0–2.0)
Acid-Base Excess: 4 mmol/L — ABNORMAL HIGH (ref 0.0–2.0)
Bicarbonate: 27.9 mmol/L (ref 20.0–28.0)
Bicarbonate: 31.4 mmol/L — ABNORMAL HIGH (ref 20.0–28.0)
O2 Saturation: 100 %
O2 Saturation: 100 %
TCO2: 29 mmol/L (ref 0–100)
TCO2: 33 mmol/L (ref 0–100)
pCO2 arterial: 40.4 mmHg (ref 32.0–48.0)
pCO2 arterial: 62.9 mmHg — ABNORMAL HIGH (ref 32.0–48.0)
pH, Arterial: 7.447 (ref 7.350–7.450)
pH, Arterial: 7.306 — ABNORMAL LOW (ref 7.350–7.450)
pO2, Arterial: 332 mmHg — ABNORMAL HIGH (ref 83.0–108.0)
pO2, Arterial: 386 mmHg — ABNORMAL HIGH (ref 83.0–108.0)

## 2017-04-29 LAB — MAGNESIUM: Magnesium: 2.6 mg/dL — ABNORMAL HIGH (ref 1.7–2.4)

## 2017-04-29 LAB — PROTIME-INR
INR: 1.54
Prothrombin Time: 18.6 seconds — ABNORMAL HIGH (ref 11.4–15.2)

## 2017-04-29 LAB — GLUCOSE, CAPILLARY
Glucose-Capillary: 107 mg/dL — ABNORMAL HIGH (ref 65–99)
Glucose-Capillary: 115 mg/dL — ABNORMAL HIGH (ref 65–99)
Glucose-Capillary: 137 mg/dL — ABNORMAL HIGH (ref 65–99)
Glucose-Capillary: 137 mg/dL — ABNORMAL HIGH (ref 65–99)
Glucose-Capillary: 137 mg/dL — ABNORMAL HIGH (ref 65–99)
Glucose-Capillary: 138 mg/dL — ABNORMAL HIGH (ref 65–99)
Glucose-Capillary: 143 mg/dL — ABNORMAL HIGH (ref 65–99)
Glucose-Capillary: 50 mg/dL — ABNORMAL LOW (ref 65–99)

## 2017-04-29 LAB — CREATININE, SERUM
Creatinine, Ser: 1.1 mg/dL (ref 0.61–1.24)
GFR calc Af Amer: >60 mL/min (ref >60)
GFR calc non Af Amer: >60 mL/min (ref >60)

## 2017-04-29 LAB — PREPARE RBC (CROSSMATCH)

## 2017-04-29 LAB — HEMOGLOBIN AND HEMATOCRIT, BLOOD
HCT: 28.3 % — ABNORMAL LOW (ref 39.0–52.0)
Hemoglobin: 9.4 g/dL — ABNORMAL LOW (ref 13.0–17.0)

## 2017-04-29 LAB — APTT: aPTT: 34 seconds (ref 24–36)

## 2017-04-29 LAB — FIBRINOGEN: Fibrinogen: 252 mg/dL (ref 210–475)

## 2017-04-29 SURGERY — REDO STERNOTOMY
Anesthesia: General

## 2017-04-29 MED ORDER — PROPOFOL 10 MG/ML IV BOLUS
INTRAVENOUS | Status: AC
  Filled 2017-04-29: qty 20

## 2017-04-29 MED ORDER — EPHEDRINE 5 MG/ML INJ
INTRAVENOUS | Status: AC
  Filled 2017-04-29: qty 10

## 2017-04-29 MED ORDER — SODIUM CHLORIDE 0.9 % IV SOLN
0.0000 ug/min | INTRAVENOUS | Status: DC
  Filled 2017-04-29: qty 2

## 2017-04-29 MED ORDER — CHLORHEXIDINE GLUCONATE 0.12 % MT SOLN
15.0000 mL | Freq: Once | OROMUCOSAL | Status: AC
  Administered 2017-04-29: 15 mL via OROMUCOSAL
  Filled 2017-04-29: qty 15

## 2017-04-29 MED ORDER — SODIUM CHLORIDE 0.9 % IV SOLN
INTRAVENOUS | Status: DC
  Administered 2017-04-29: 15:00:00 via INTRAVENOUS

## 2017-04-29 MED ORDER — HYDROCORTISONE NA SUCCINATE PF 250 MG IJ SOLR
INTRAMUSCULAR | Status: AC
  Filled 2017-04-29: qty 250

## 2017-04-29 MED ORDER — SODIUM CHLORIDE 0.9 % IJ SOLN
OROMUCOSAL | Status: DC | PRN
  Administered 2017-04-29 (×3): 4 mL via TOPICAL

## 2017-04-29 MED ORDER — MAGNESIUM SULFATE 4 GM/100ML IV SOLN
4.0000 g | Freq: Once | INTRAVENOUS | Status: AC
  Administered 2017-04-29: 4 g via INTRAVENOUS
  Filled 2017-04-29: qty 100

## 2017-04-29 MED ORDER — CALCIUM CHLORIDE 10 % IV SOLN
INTRAVENOUS | Status: DC | PRN
  Administered 2017-04-29: 100 mg via INTRAVENOUS

## 2017-04-29 MED ORDER — HEPARIN SODIUM (PORCINE) 1000 UNIT/ML IJ SOLN
INTRAMUSCULAR | Status: DC | PRN
  Administered 2017-04-29: 33000 [IU] via INTRAVENOUS

## 2017-04-29 MED ORDER — ARTIFICIAL TEARS OPHTHALMIC OINT
TOPICAL_OINTMENT | OPHTHALMIC | Status: DC | PRN
  Administered 2017-04-29: 1 via OPHTHALMIC

## 2017-04-29 MED ORDER — MIDAZOLAM HCL 10 MG/2ML IJ SOLN
INTRAMUSCULAR | Status: AC
  Filled 2017-04-29: qty 2

## 2017-04-29 MED ORDER — MORPHINE SULFATE (PF) 2 MG/ML IV SOLN: 2.0000 mg | INTRAVENOUS | Status: DC | PRN

## 2017-04-29 MED ORDER — ROCURONIUM BROMIDE 10 MG/ML (PF) SYRINGE
PREFILLED_SYRINGE | INTRAVENOUS | Status: AC
  Filled 2017-04-29: qty 5

## 2017-04-29 MED ORDER — VANCOMYCIN HCL IN DEXTROSE 1-5 GM/200ML-% IV SOLN
1000.0000 mg | Freq: Once | INTRAVENOUS | Status: AC
  Administered 2017-04-29: 1000 mg via INTRAVENOUS
  Filled 2017-04-29: qty 200

## 2017-04-29 MED ORDER — DEXTROSE 50 % IV SOLN
INTRAVENOUS | Status: AC
  Administered 2017-04-29: 20 mL via INTRAVENOUS
  Filled 2017-04-29: qty 50

## 2017-04-29 MED ORDER — SODIUM CHLORIDE 0.9% FLUSH
3.0000 mL | Freq: Two times a day (BID) | INTRAVENOUS | Status: DC
  Administered 2017-05-07: 3 mL via INTRAVENOUS
  Administered 2017-05-07 – 2017-05-09 (×3): 10 mL via INTRAVENOUS
  Administered 2017-05-10 – 2017-05-23 (×21): 3 mL via INTRAVENOUS

## 2017-04-29 MED ORDER — ARTIFICIAL TEARS OPHTHALMIC OINT
TOPICAL_OINTMENT | OPHTHALMIC | Status: AC
  Filled 2017-04-29: qty 3.5

## 2017-04-29 MED ORDER — LACTATED RINGERS IV SOLN: 500.0000 mL | Freq: Once | INTRAVENOUS | Status: DC | PRN

## 2017-04-29 MED ORDER — FENTANYL CITRATE (PF) 100 MCG/2ML IJ SOLN
INTRAMUSCULAR | Status: DC | PRN
  Administered 2017-04-29 (×2): 150 ug via INTRAVENOUS
  Administered 2017-04-29: 50 ug via INTRAVENOUS
  Administered 2017-04-29: 250 ug via INTRAVENOUS
  Administered 2017-04-29: 50 ug via INTRAVENOUS
  Administered 2017-04-29 (×2): 100 ug via INTRAVENOUS
  Administered 2017-04-29: 150 ug via INTRAVENOUS

## 2017-04-29 MED ORDER — FENTANYL CITRATE (PF) 250 MCG/5ML IJ SOLN
INTRAMUSCULAR | Status: AC
  Filled 2017-04-29: qty 25

## 2017-04-29 MED ORDER — POTASSIUM CHLORIDE 10 MEQ/50ML IV SOLN
10.0000 meq | INTRAVENOUS | Status: AC
  Administered 2017-04-29 (×3): 10 meq via INTRAVENOUS

## 2017-04-29 MED ORDER — NOREPINEPHRINE BITARTRATE 1 MG/ML IV SOLN
0.0000 ug/min | INTRAVENOUS | Status: AC
  Administered 2017-04-29: 1 ug/min via INTRAVENOUS
  Filled 2017-04-29: qty 4

## 2017-04-29 MED ORDER — GLYCOPYRROLATE 0.2 MG/ML IV SOSY
PREFILLED_SYRINGE | INTRAVENOUS | Status: DC | PRN
  Administered 2017-04-29: .2 mg via INTRAVENOUS

## 2017-04-29 MED ORDER — 0.9 % SODIUM CHLORIDE (POUR BTL) OPTIME
TOPICAL | Status: DC | PRN
  Administered 2017-04-29: 5000 mL

## 2017-04-29 MED ORDER — METOPROLOL TARTRATE 12.5 MG HALF TABLET
12.5000 mg | ORAL_TABLET | Freq: Once | ORAL | Status: DC
  Filled 2017-04-29: qty 1

## 2017-04-29 MED ORDER — SODIUM CHLORIDE 0.9 % IV SOLN
INTRAVENOUS | Status: DC | PRN
  Administered 2017-04-29: 14:00:00 via INTRAVENOUS

## 2017-04-29 MED ORDER — PHENYLEPHRINE 40 MCG/ML (10ML) SYRINGE FOR IV PUSH (FOR BLOOD PRESSURE SUPPORT)
PREFILLED_SYRINGE | INTRAVENOUS | Status: DC | PRN
  Administered 2017-04-29 (×2): 80 ug via INTRAVENOUS

## 2017-04-29 MED ORDER — ASPIRIN EC 325 MG PO TBEC
325.0000 mg | DELAYED_RELEASE_TABLET | Freq: Every day | ORAL | Status: DC
  Administered 2017-04-30: 325 mg via ORAL
  Filled 2017-04-29: qty 1

## 2017-04-29 MED ORDER — BISACODYL 5 MG PO TBEC
10.0000 mg | DELAYED_RELEASE_TABLET | Freq: Every day | ORAL | Status: DC
  Administered 2017-04-30 – 2017-05-15 (×5): 10 mg via ORAL
  Filled 2017-04-29 (×6): qty 2

## 2017-04-29 MED ORDER — PROPOFOL 10 MG/ML IV BOLUS
INTRAVENOUS | Status: DC | PRN
  Administered 2017-04-29: 30 mg via INTRAVENOUS
  Administered 2017-04-29: 50 mg via INTRAVENOUS
  Administered 2017-04-29: 20 mg via INTRAVENOUS
  Administered 2017-04-29 (×2): 50 mg via INTRAVENOUS

## 2017-04-29 MED ORDER — SODIUM CHLORIDE 0.9% FLUSH
3.0000 mL | INTRAVENOUS | Status: DC | PRN
  Administered 2017-05-16: 3 mL via INTRAVENOUS
  Filled 2017-04-29: qty 3

## 2017-04-29 MED ORDER — ONDANSETRON HCL 4 MG/2ML IJ SOLN
4.0000 mg | Freq: Four times a day (QID) | INTRAMUSCULAR | Status: DC | PRN
  Administered 2017-05-17: 4 mg via INTRAVENOUS
  Filled 2017-04-29: qty 2

## 2017-04-29 MED ORDER — ACETAMINOPHEN 160 MG/5ML PO SOLN
1000.0000 mg | Freq: Four times a day (QID) | ORAL | Status: AC
  Administered 2017-04-30 – 2017-05-04 (×14): 1000 mg
  Filled 2017-04-29 (×15): qty 40.6

## 2017-04-29 MED ORDER — MORPHINE SULFATE (PF) 2 MG/ML IV SOLN: 1.0000 mg | INTRAVENOUS | Status: DC | PRN

## 2017-04-29 MED ORDER — PHENYLEPHRINE 40 MCG/ML (10ML) SYRINGE FOR IV PUSH (FOR BLOOD PRESSURE SUPPORT)
PREFILLED_SYRINGE | INTRAVENOUS | Status: AC
  Filled 2017-04-29: qty 10

## 2017-04-29 MED ORDER — ALBUMIN HUMAN 5 % IV SOLN
12.5000 g | Freq: Once | INTRAVENOUS | Status: AC
  Administered 2017-04-29: 12.5 g via INTRAVENOUS

## 2017-04-29 MED ORDER — LACTATED RINGERS IV SOLN
INTRAVENOUS | Status: DC | PRN
  Administered 2017-04-29 (×2): via INTRAVENOUS

## 2017-04-29 MED ORDER — PHENYLEPHRINE HCL 10 MG/ML IJ SOLN
INTRAVENOUS | Status: DC | PRN
  Administered 2017-04-29: 20 ug/min via INTRAVENOUS

## 2017-04-29 MED ORDER — DEXTROSE 50 % IV SOLN
20.0000 mL | Freq: Once | INTRAVENOUS | Status: AC
  Administered 2017-04-29: 20 mL via INTRAVENOUS

## 2017-04-29 MED ORDER — ACETAMINOPHEN 160 MG/5ML PO SOLN: 650.0000 mg | Freq: Once | ORAL | Status: AC

## 2017-04-29 MED ORDER — CHLORHEXIDINE GLUCONATE 4 % EX LIQD: 30.0000 mL | CUTANEOUS | Status: DC

## 2017-04-29 MED ORDER — SODIUM CHLORIDE 0.9 % IV SOLN
250.0000 mL | INTRAVENOUS | Status: DC
  Administered 2017-04-30: 250 mL via INTRAVENOUS

## 2017-04-29 MED ORDER — SODIUM CHLORIDE 0.9 % IV SOLN: Freq: Once | INTRAVENOUS | Status: DC

## 2017-04-29 MED ORDER — ROCURONIUM BROMIDE 10 MG/ML (PF) SYRINGE
PREFILLED_SYRINGE | INTRAVENOUS | Status: DC | PRN
  Administered 2017-04-29 (×2): 50 mg via INTRAVENOUS
  Administered 2017-04-29: 40 mg via INTRAVENOUS
  Administered 2017-04-29 (×2): 50 mg via INTRAVENOUS
  Administered 2017-04-29: 60 mg via INTRAVENOUS
  Administered 2017-04-29: 50 mg via INTRAVENOUS

## 2017-04-29 MED ORDER — LIDOCAINE 2% (20 MG/ML) 5 ML SYRINGE
INTRAMUSCULAR | Status: AC
  Filled 2017-04-29: qty 5

## 2017-04-29 MED ORDER — MORPHINE SULFATE (PF) 4 MG/ML IV SOLN
2.0000 mg | INTRAVENOUS | Status: DC | PRN
  Administered 2017-05-01 – 2017-05-02 (×5): 2 mg via INTRAVENOUS
  Administered 2017-05-05: 4 mg via INTRAVENOUS
  Administered 2017-05-06: 2 mg via INTRAVENOUS
  Filled 2017-04-29 (×7): qty 1

## 2017-04-29 MED ORDER — MILRINONE LACTATE IN DEXTROSE 20-5 MG/100ML-% IV SOLN
0.1500 ug/kg/min | INTRAVENOUS | Status: AC
  Administered 2017-04-29: 0.15 ug/kg/min via INTRAVENOUS

## 2017-04-29 MED ORDER — ALBUMIN HUMAN 5 % IV SOLN
250.0000 mL | INTRAVENOUS | Status: AC | PRN
  Administered 2017-04-29 (×3): 250 mL via INTRAVENOUS
  Filled 2017-04-29 (×3): qty 250

## 2017-04-29 MED ORDER — DOPAMINE-DEXTROSE 3.2-5 MG/ML-% IV SOLN
0.0000 ug/kg/min | INTRAVENOUS | Status: AC
  Administered 2017-04-29: 3 ug/kg/min via INTRAVENOUS

## 2017-04-29 MED ORDER — MORPHINE SULFATE (PF) 4 MG/ML IV SOLN: 1.0000 mg | INTRAVENOUS | Status: AC | PRN

## 2017-04-29 MED ORDER — ACETAMINOPHEN 650 MG RE SUPP
650.0000 mg | Freq: Once | RECTAL | Status: AC
  Administered 2017-04-29: 650 mg via RECTAL

## 2017-04-29 MED ORDER — CHLORHEXIDINE GLUCONATE 0.12 % MT SOLN
15.0000 mL | OROMUCOSAL | Status: AC
  Administered 2017-04-29: 15 mL via OROMUCOSAL

## 2017-04-29 MED ORDER — LACTATED RINGERS IV SOLN
INTRAVENOUS | Status: DC | PRN
  Administered 2017-04-29 (×2): via INTRAVENOUS

## 2017-04-29 MED ORDER — LACTATED RINGERS IV SOLN
INTRAVENOUS | Status: DC | PRN
  Administered 2017-04-29: 07:00:00 via INTRAVENOUS

## 2017-04-29 MED ORDER — METOCLOPRAMIDE HCL 5 MG/ML IJ SOLN
10.0000 mg | Freq: Four times a day (QID) | INTRAMUSCULAR | Status: AC
  Administered 2017-04-29 – 2017-04-30 (×4): 10 mg via INTRAVENOUS
  Filled 2017-04-29 (×2): qty 2

## 2017-04-29 MED ORDER — HEPARIN SODIUM (PORCINE) 1000 UNIT/ML IJ SOLN
INTRAMUSCULAR | Status: AC
  Filled 2017-04-29: qty 1

## 2017-04-29 MED ORDER — NITROGLYCERIN IN D5W 200-5 MCG/ML-% IV SOLN: 0.0000 ug/min | INTRAVENOUS | Status: DC

## 2017-04-29 MED ORDER — OXYCODONE HCL 5 MG PO TABS: 5.0000 mg | ORAL_TABLET | ORAL | Status: DC | PRN

## 2017-04-29 MED ORDER — LACTATED RINGERS IV SOLN
INTRAVENOUS | Status: DC
  Administered 2017-04-29: 15:00:00 via INTRAVENOUS

## 2017-04-29 MED ORDER — HEMOSTATIC AGENTS (NO CHARGE) OPTIME
TOPICAL | Status: DC | PRN
  Administered 2017-04-29 (×5): 1 via TOPICAL

## 2017-04-29 MED ORDER — DOCUSATE SODIUM 100 MG PO CAPS
200.0000 mg | ORAL_CAPSULE | Freq: Every day | ORAL | Status: DC
  Administered 2017-04-30: 200 mg via ORAL
  Filled 2017-04-29: qty 2

## 2017-04-29 MED ORDER — METOPROLOL TARTRATE 25 MG/10 ML ORAL SUSPENSION: 12.5000 mg | Freq: Two times a day (BID) | ORAL | Status: DC

## 2017-04-29 MED ORDER — MIDAZOLAM HCL 5 MG/5ML IJ SOLN
INTRAMUSCULAR | Status: DC | PRN
  Administered 2017-04-29: 3 mg via INTRAVENOUS
  Administered 2017-04-29 (×2): 1 mg via INTRAVENOUS
  Administered 2017-04-29: 2 mg via INTRAVENOUS
  Administered 2017-04-29: 3 mg via INTRAVENOUS

## 2017-04-29 MED ORDER — HYDROCORTISONE NA SUCCINATE PF 100 MG IJ SOLR
INTRAMUSCULAR | Status: DC | PRN
  Administered 2017-04-29: 125 mg via INTRAVENOUS

## 2017-04-29 MED ORDER — ASPIRIN 81 MG PO CHEW: 324.0000 mg | CHEWABLE_TABLET | Freq: Every day | ORAL | Status: DC

## 2017-04-29 MED ORDER — PROTAMINE SULFATE 10 MG/ML IV SOLN
INTRAVENOUS | Status: DC | PRN
  Administered 2017-04-29: 50 mg via INTRAVENOUS
  Administered 2017-04-29: 30 mg via INTRAVENOUS
  Administered 2017-04-29: 50 mg via INTRAVENOUS
  Administered 2017-04-29: 30 mg via INTRAVENOUS
  Administered 2017-04-29: 50 mg via INTRAVENOUS
  Administered 2017-04-29: 10 mg via INTRAVENOUS
  Administered 2017-04-29: 70 mg via INTRAVENOUS
  Administered 2017-04-29: 30 mg via INTRAVENOUS

## 2017-04-29 MED ORDER — CHLORHEXIDINE GLUCONATE CLOTH 2 % EX PADS
6.0000 | MEDICATED_PAD | Freq: Every day | CUTANEOUS | Status: DC
  Administered 2017-04-29 – 2017-05-01 (×3): 6 via TOPICAL

## 2017-04-29 MED ORDER — PANTOPRAZOLE SODIUM 40 MG PO TBEC: 40.0000 mg | DELAYED_RELEASE_TABLET | Freq: Every day | ORAL | Status: DC

## 2017-04-29 MED ORDER — METOPROLOL TARTRATE 12.5 MG HALF TABLET: 12.5000 mg | ORAL_TABLET | Freq: Two times a day (BID) | ORAL | Status: DC

## 2017-04-29 MED ORDER — SODIUM CHLORIDE 0.9 % IV SOLN
0.0000 ug/kg/h | INTRAVENOUS | Status: DC
  Filled 2017-04-29: qty 2

## 2017-04-29 MED ORDER — FAMOTIDINE IN NACL 20-0.9 MG/50ML-% IV SOLN
20.0000 mg | Freq: Two times a day (BID) | INTRAVENOUS | Status: AC
  Administered 2017-04-29 (×2): 20 mg via INTRAVENOUS
  Filled 2017-04-29: qty 50

## 2017-04-29 MED ORDER — ALBUMIN HUMAN 5 % IV SOLN
INTRAVENOUS | Status: DC | PRN
  Administered 2017-04-29: 14:00:00 via INTRAVENOUS

## 2017-04-29 MED ORDER — CHLORHEXIDINE GLUCONATE 0.12 % MT SOLN
15.0000 mL | Freq: Two times a day (BID) | OROMUCOSAL | Status: DC
  Administered 2017-04-30 – 2017-05-01 (×4): 15 mL via OROMUCOSAL
  Filled 2017-04-29 (×3): qty 15

## 2017-04-29 MED ORDER — NOREPINEPHRINE BITARTRATE 1 MG/ML IV SOLN
0.0000 ug/min | INTRAVENOUS | Status: AC
  Administered 2017-04-29: 10 ug/min via INTRAVENOUS
  Filled 2017-04-29: qty 4

## 2017-04-29 MED ORDER — SUCCINYLCHOLINE CHLORIDE 200 MG/10ML IV SOSY
PREFILLED_SYRINGE | INTRAVENOUS | Status: AC
  Filled 2017-04-29: qty 10

## 2017-04-29 MED ORDER — SODIUM CHLORIDE 0.9% FLUSH
10.0000 mL | Freq: Two times a day (BID) | INTRAVENOUS | Status: DC
  Administered 2017-04-30: 10 mL

## 2017-04-29 MED ORDER — MILRINONE LACTATE IN DEXTROSE 20-5 MG/100ML-% IV SOLN
0.1250 ug/kg/min | INTRAVENOUS | Status: AC
  Administered 2017-04-29: .3 ug/kg/min via INTRAVENOUS
  Filled 2017-04-29: qty 100

## 2017-04-29 MED ORDER — BISACODYL 10 MG RE SUPP
10.0000 mg | Freq: Every day | RECTAL | Status: DC
  Administered 2017-05-03 – 2017-05-12 (×4): 10 mg via RECTAL
  Filled 2017-04-29 (×6): qty 1

## 2017-04-29 MED ORDER — ACETAMINOPHEN 500 MG PO TABS
1000.0000 mg | ORAL_TABLET | Freq: Four times a day (QID) | ORAL | Status: AC
  Administered 2017-04-30 – 2017-05-02 (×5): 1000 mg via ORAL
  Filled 2017-04-29 (×6): qty 2

## 2017-04-29 MED ORDER — MILRINONE LACTATE IN DEXTROSE 20-5 MG/100ML-% IV SOLN
0.3000 ug/kg/min | INTRAVENOUS | Status: AC
  Administered 2017-04-29: 0.3 ug/kg/min via INTRAVENOUS

## 2017-04-29 MED ORDER — ORAL CARE MOUTH RINSE
15.0000 mL | Freq: Two times a day (BID) | OROMUCOSAL | Status: DC
  Administered 2017-04-30 – 2017-05-01 (×4): 15 mL via OROMUCOSAL

## 2017-04-29 MED ORDER — LACTATED RINGERS IV SOLN: INTRAVENOUS | Status: DC

## 2017-04-29 MED ORDER — METOPROLOL TARTRATE 5 MG/5ML IV SOLN: 2.5000 mg | INTRAVENOUS | Status: DC | PRN

## 2017-04-29 MED ORDER — SODIUM CHLORIDE 0.9 % IV SOLN
INTRAVENOUS | Status: DC
  Administered 2017-04-29: 3.1 [IU]/h via INTRAVENOUS
  Filled 2017-04-29 (×2): qty 1

## 2017-04-29 MED ORDER — INSULIN REGULAR BOLUS VIA INFUSION
0.0000 [IU] | Freq: Three times a day (TID) | INTRAVENOUS | Status: DC
  Filled 2017-04-29: qty 10

## 2017-04-29 MED ORDER — PROTAMINE SULFATE 10 MG/ML IV SOLN
INTRAVENOUS | Status: AC
  Filled 2017-04-29: qty 25

## 2017-04-29 MED ORDER — EPHEDRINE SULFATE-NACL 50-0.9 MG/10ML-% IV SOSY
PREFILLED_SYRINGE | INTRAVENOUS | Status: DC | PRN
  Administered 2017-04-29: 5 mg via INTRAVENOUS

## 2017-04-29 MED ORDER — SODIUM CHLORIDE 0.9% FLUSH
10.0000 mL | INTRAVENOUS | Status: DC | PRN
Start: 2017-04-29 — End: 2017-05-04

## 2017-04-29 MED ORDER — MIDAZOLAM HCL 2 MG/2ML IJ SOLN: 2.0000 mg | INTRAMUSCULAR | Status: DC | PRN

## 2017-04-29 MED ORDER — SODIUM CHLORIDE 0.45 % IV SOLN
INTRAVENOUS | Status: DC | PRN
  Administered 2017-04-29: 15:00:00 via INTRAVENOUS

## 2017-04-29 MED ORDER — DEXTROSE 5 % IV SOLN
1.5000 g | Freq: Two times a day (BID) | INTRAVENOUS | Status: AC
  Administered 2017-04-29 – 2017-05-01 (×3): 1.5 g via INTRAVENOUS
  Filled 2017-04-29 (×4): qty 1.5

## 2017-04-29 MED ORDER — TRAMADOL HCL 50 MG PO TABS
50.0000 mg | ORAL_TABLET | ORAL | Status: DC | PRN
  Administered 2017-04-30: 50 mg via ORAL
  Filled 2017-04-29: qty 1

## 2017-04-29 MED FILL — Heparin Sodium (Porcine) Inj 1000 Unit/ML: INTRAMUSCULAR | Qty: 10 | Status: AC

## 2017-04-29 MED FILL — Sodium Chloride IV Soln 0.9%: INTRAVENOUS | Qty: 2000 | Status: AC

## 2017-04-29 MED FILL — Calcium Chloride Inj 10%: INTRAVENOUS | Qty: 10 | Status: AC

## 2017-04-29 MED FILL — Mannitol IV Soln 20%: INTRAVENOUS | Qty: 500 | Status: AC

## 2017-04-29 MED FILL — Electrolyte-R (PH 7.4) Solution: INTRAVENOUS | Qty: 5000 | Status: AC

## 2017-04-29 MED FILL — Lidocaine HCl IV Inj 20 MG/ML: INTRAVENOUS | Qty: 5 | Status: AC

## 2017-04-29 MED FILL — Sodium Bicarbonate IV Soln 8.4%: INTRAVENOUS | Qty: 50 | Status: AC

## 2017-04-29 SURGICAL SUPPLY — 99 items
ADAPTER CARDIO PERF ANTE/RETRO (ADAPTER) ×2 IMPLANT
ADPR PRFSN 84XANTGRD RTRGD (ADAPTER) ×1
AGENT HMST KT MTR STRL THRMB (HEMOSTASIS) ×1
APL SKNCLS STERI-STRIP NONHPOA (GAUZE/BANDAGES/DRESSINGS) ×1
APL SRG 7X2 LUM MLBL SLNT (VASCULAR PRODUCTS) ×2
APPLICATOR COTTON TIP 6IN STRL (MISCELLANEOUS) IMPLANT
APPLICATOR TIP COSEAL (VASCULAR PRODUCTS) ×2 IMPLANT
BAG DECANTER FOR FLEXI CONT (MISCELLANEOUS) ×2 IMPLANT
BENZOIN TINCTURE PRP APPL 2/3 (GAUZE/BANDAGES/DRESSINGS) ×1 IMPLANT
BLADE CORE FAN STRYKER (BLADE) ×2 IMPLANT
BLADE STERNUM SYSTEM 6 (BLADE) ×1 IMPLANT
BLADE SURG 15 STRL LF DISP TIS (BLADE) ×1 IMPLANT
BLADE SURG 15 STRL SS (BLADE) ×2
BOOT SUTURE AID YELLOW STND (SUTURE) ×1 IMPLANT
CANISTER SUCT 3000ML PPV (MISCELLANEOUS) ×3 IMPLANT
CANNULA GUNDRY RCSP 15FR (MISCELLANEOUS) ×3 IMPLANT
CANNULA SUMP PERICARDIAL (CANNULA) ×1 IMPLANT
CANNULA VEN 2 STAGE (MISCELLANEOUS) ×1 IMPLANT
CATH CPB KIT GERHARDT (MISCELLANEOUS) ×2 IMPLANT
CATH HEART VENT LEFT (CATHETERS) ×1 IMPLANT
CATH THORACIC 28FR (CATHETERS) IMPLANT
CATH/SQUID NICHOLS JEHLE COR (CATHETERS) ×1 IMPLANT
CAUTERY EYE LOW TEMP 1300F FIN (OPHTHALMIC RELATED) ×2 IMPLANT
CAUTERY SURG HI TEMP FINE TIP (MISCELLANEOUS) ×1 IMPLANT
CLIP FOGARTY SPRING 6M (CLIP) IMPLANT
CONN ST 1/4X3/8  BEN (MISCELLANEOUS) ×1
CONN ST 1/4X3/8 BEN (MISCELLANEOUS) IMPLANT
CONT SPEC 4OZ CLIKSEAL STRL BL (MISCELLANEOUS) ×1 IMPLANT
CRADLE DONUT ADULT HEAD (MISCELLANEOUS) ×2 IMPLANT
DRAIN CHANNEL 28F RND 3/8 FF (WOUND CARE) ×2 IMPLANT
DRAPE CARDIOVASCULAR INCISE (DRAPES) ×2
DRAPE SLUSH/WARMER DISC (DRAPES) ×2 IMPLANT
DRAPE SRG 135X102X78XABS (DRAPES) ×1 IMPLANT
DRSG AQUACEL AG ADV 3.5X14 (GAUZE/BANDAGES/DRESSINGS) ×2 IMPLANT
ELECT BLADE 4.0 EZ CLEAN MEGAD (MISCELLANEOUS) ×2
ELECT CAUTERY BLADE 6.4 (BLADE) ×2 IMPLANT
ELECT REM PT RETURN 9FT ADLT (ELECTROSURGICAL) ×4
ELECTRODE BLDE 4.0 EZ CLN MEGD (MISCELLANEOUS) ×1 IMPLANT
ELECTRODE REM PT RTRN 9FT ADLT (ELECTROSURGICAL) ×2 IMPLANT
FELT TEFLON 1X6 (MISCELLANEOUS) ×3 IMPLANT
GAUZE SPONGE 4X4 12PLY STRL LF (GAUZE/BANDAGES/DRESSINGS) ×1 IMPLANT
GLOVE BIO SURGEON STRL SZ 6.5 (GLOVE) ×10 IMPLANT
GLOVE BIO SURGEON STRL SZ7 (GLOVE) ×6 IMPLANT
GLOVE BIOGEL PI IND STRL 6.5 (GLOVE) IMPLANT
GLOVE BIOGEL PI INDICATOR 6.5 (GLOVE) ×1
GOWN STRL REUS W/ TWL LRG LVL3 (GOWN DISPOSABLE) ×4 IMPLANT
GOWN STRL REUS W/TWL LRG LVL3 (GOWN DISPOSABLE) ×16
GRAFT 4 BRANCH 32X50 (Prosthesis & Implant Heart) ×1 IMPLANT
HANDLE STAPLE ENDO GIA SHORT (STAPLE) ×1
HEMOSTAT POWDER SURGIFOAM 1G (HEMOSTASIS) ×7 IMPLANT
HEMOSTAT SURGICEL 2X14 (HEMOSTASIS) ×3 IMPLANT
INSERT FOGARTY XLG (MISCELLANEOUS) ×1 IMPLANT
KIT BASIN OR (CUSTOM PROCEDURE TRAY) ×2 IMPLANT
KIT CATH SUCT 8FR (CATHETERS) ×2 IMPLANT
KIT DRAINAGE VACCUM ASSIST (KITS) ×1 IMPLANT
KIT ROOM TURNOVER OR (KITS) ×2 IMPLANT
KIT SUCTION CATH 14FR (SUCTIONS) ×7 IMPLANT
LEAD PACING MYOCARDI (MISCELLANEOUS) ×2 IMPLANT
LINE VENT (MISCELLANEOUS) ×1 IMPLANT
NEEDLE AORTIC AIR ASPIRATING (NEEDLE) ×1 IMPLANT
NS IRRIG 1000ML POUR BTL (IV SOLUTION) ×10 IMPLANT
PACK OPEN HEART (CUSTOM PROCEDURE TRAY) ×2 IMPLANT
PAD ARMBOARD 7.5X6 YLW CONV (MISCELLANEOUS) ×4 IMPLANT
RELOAD GIA II 30 3.5 (ENDOMECHANICALS) ×4 IMPLANT
SEALANT SURG COSEAL 8ML (VASCULAR PRODUCTS) ×2 IMPLANT
SET CARDIOPLEGIA MPS 5001102 (MISCELLANEOUS) ×1 IMPLANT
SET VEIN GRAFT PERF (SET/KITS/TRAYS/PACK) ×1 IMPLANT
SPONGE GAUZE 4X4 12PLY STER LF (GAUZE/BANDAGES/DRESSINGS) ×3 IMPLANT
SPONGE LAP 18X18 X RAY DECT (DISPOSABLE) ×1 IMPLANT
SPONGE LAP 4X18 X RAY DECT (DISPOSABLE) ×1 IMPLANT
STAPLER ENDO GIA 12 SHRT THIN (STAPLE) IMPLANT
STAPLER ENDO GIA 12MM SHORT (STAPLE) ×1 IMPLANT
STOPCOCK 4 WAY LG BORE MALE ST (IV SETS) IMPLANT
SURGIFLO W/THROMBIN 8M KIT (HEMOSTASIS) ×1 IMPLANT
SUT ETHIBON 2 0 V 52N 30 (SUTURE) ×2 IMPLANT
SUT ETHIBOND 2 0 SH (SUTURE) ×2
SUT ETHIBOND 2 0 SH 36X2 (SUTURE) ×1 IMPLANT
SUT PROLENE 3 0 RB 1 (SUTURE) ×5 IMPLANT
SUT PROLENE 3 0 SH1 36 (SUTURE) ×12 IMPLANT
SUT PROLENE 4 0 RB 1 (SUTURE) ×22
SUT PROLENE 4-0 RB1 .5 CRCL 36 (SUTURE) ×1 IMPLANT
SUT PROLENE 6 0 C 1 30 (SUTURE) ×5 IMPLANT
SUT SILK 1 TIES 10X30 (SUTURE) ×1 IMPLANT
SUT STEEL 6MS V (SUTURE) ×1 IMPLANT
SUT STEEL SZ 6 DBL 3X14 BALL (SUTURE) ×1 IMPLANT
SUT VIC AB 1 CTX 18 (SUTURE) ×4 IMPLANT
SUTURE E-PAK OPEN HEART (SUTURE) IMPLANT
SYSTEM SAHARA CHEST DRAIN ATS (WOUND CARE) ×2 IMPLANT
TAPE CLOTH SURG 4X10 WHT LF (GAUZE/BANDAGES/DRESSINGS) ×1 IMPLANT
TAPE PAPER 3X10 WHT MICROPORE (GAUZE/BANDAGES/DRESSINGS) ×1 IMPLANT
TOWEL GREEN STERILE (TOWEL DISPOSABLE) ×5 IMPLANT
TOWEL GREEN STERILE FF (TOWEL DISPOSABLE) ×3 IMPLANT
TOWEL OR 17X24 6PK STRL BLUE (TOWEL DISPOSABLE) ×2 IMPLANT
TOWEL OR 17X26 10 PK STRL BLUE (TOWEL DISPOSABLE) ×2 IMPLANT
TRAY FOLEY SILVER 16FR TEMP (SET/KITS/TRAYS/PACK) ×2 IMPLANT
TUBING PVC 1/4X1/16 WALL 8 (MISCELLANEOUS) ×1 IMPLANT
UNDERPAD 30X30 (UNDERPADS AND DIAPERS) ×2 IMPLANT
VENT LEFT HEART 12002 (CATHETERS) ×2
WATER STERILE IRR 1000ML POUR (IV SOLUTION) ×4 IMPLANT

## 2017-04-29 NOTE — Procedures (Signed)
Extubation Procedure Note  Patient Details:   Name: Jack Joseph DOB: [birth year] MRN: [id number]   Airway Documentation:     Evaluation  O2 sats: stable throughout Complications: No apparent complications Patient did tolerate procedure well. Bilateral Breath Sounds: Clear, Diminished   Yes  [name] 04/29/2017, 11:45 PM   Patient performed a NIF of -20, FVC of , and cuff leak was present prior to extubation. Patient was extubated to 4L nasal cannula and was able to tell us where he was and his name. Patient tolerating extubation well at this time. RT will continue to monitor.

## 2017-04-29 NOTE — Progress Notes (Signed)
Rapid Wean Protocol begun, RN at bedside. Patient tolerating well at this time. RT will continue to monitor.

## 2017-04-29 NOTE — Progress Notes (Signed)
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    Patient had replacement of his aortic valve with a  with  27 mm Bjork-Shiley tilting disk valve  27 [id number] in 1983 for Aortic Insufficiency, probability a bicuspid valve. In 2003 echocardiogram noted LV dysfunction 30-35 % and 39 mm aorta just above the cusps and 58 mm at mid ascending aorta. 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 Since the first time at singing his symptoms have markedly improved. He's ambulating without difficulty   He has no known history of MI  I have contacted the [facility] recall  Administrator  and confirmed the serial number of the patients valve is not included in the [facility] valve recall in the 1980's. Surgery done by Dr Raenette Rover [facility]     the patient has had cardiac catheterization,   Current Activity/ Functional Status:  Patient is independent with mobility/ambulation, transfers, ADL's, IADL's.   Zubrod Score: At the time of surgery this patient's most appropriate activity status/level should be described as: []     0    Normal activity, no symptoms [x]     1    Restricted in physical strenuous activity but ambulatory, able to do out light work, patient has difficulty walking using crutches  []     2    Ambulatory and capable of self care, unable to do work activities, up and about                >50 % of waking hours                              []     3    Only limited self care, in bed greater than 50% of waking hours []     4    Completely disabled, no self care, confined to bed or chair []     5    Moribund   Past Medical History:  Diagnosis Date  . AVD (aortic valve disease)   . Chronic anticoagulation   . Dilated cardiomyopathy (HCC)    EF 30-35%  . Dysrhythmia   . Hyperlipidemia   . LV dysfunction     Past Surgical History:  Procedure Laterality Date  . AORTIC VALVE REPLACEMENT    . CORONARY ARTERY BYPASS GRAFT    . DOPPLER ECHOCARDIOGRAPHY  04/07/2002   EF 30-35%  . RIGHT/LEFT HEART CATH AND CORONARY ANGIOGRAPHY N/A 03/05/2017   Procedure: Right/Left Heart Cath and Coronary Angiography;  Surgeon: Peter M Swaziland, MD;  Location: [facility] INVASIVE CV LAB;  Service: Cardiovascular;  Laterality: N/A;    Family History  Problem Relation Age of Onset  .  Heart disease Brother     Social History   Social History  . Marital status: Married    Spouse name: N/A  . Number of children: N/A  . Years of education: N/A   Occupational History  . Not on file.   Social History Main Topics  . Smoking status: Former Smoker    Quit date: 11/12/2000  . Smokeless tobacco: Never Used  . Alcohol use No  . Drug use: No  . Sexual activity: Yes   Other Topics Concern  . Not on file   Social History Narrative  . No narrative on file    History  Smoking Status  . Former Smoker  . Quit date: 11/12/2000  Smokeless Tobacco  . Never Used    History  Alcohol Use No     Allergies  Allergen Reactions  . No Known Allergies     Current Facility-Administered Medications  Medication Dose Route Frequency Provider Last Rate Last Dose  . 0.9 %  sodium chloride infusion   Intravenous Once Nils Pyle, CRNA      . cefUROXime (ZINACEF) 1.5 g in dextrose 5 % 50 mL IVPB  1.5 g Intravenous To OR [name], MD      . cefUROXime (ZINACEF) 750 mg in dextrose 5 % 50 mL IVPB   750 mg Intravenous To OR [name], MD      . chlorhexidine (HIBICLENS) 4 % liquid 2 application  30 mL Topical UD [name], MD      . dexmedetomidine (PRECEDEX) 400 MCG/100ML (4 mcg/mL) infusion  0.1-0.7 mcg/kg/hr Intravenous To OR [name], MD      . DOPamine (INTROPIN) 800 mg in dextrose 5 % 250 mL (3.2 mg/mL) infusion  0-10 mcg/kg/min Intravenous To OR [name], MD      . EPINEPHrine (ADRENALIN) 4 mg in dextrose 5 % 250 mL (0.016 mg/mL) infusion  0-10 mcg/min Intravenous To OR [name], MD      . heparin 2,500 Units, papaverine 30 mg in electrolyte-148 (PLASMALYTE-148) 500 mL irrigation   Irrigation To OR [name], MD      . heparin 30,000 units/NS 1000 mL solution for CELLSAVER   Other To OR [name], MD      . insulin regular (NOVOLIN R,HUMULIN R) 100 Units in sodium chloride 0.9 % 100 mL (1 Units/mL) infusion   Intravenous To OR [name], MD      . magnesium sulfate (IV Push/IM) injection 40 mEq  40 mEq Other To OR [name], MD      . metoprolol tartrate (LOPRESSOR) tablet 12.5 mg  12.5 mg Oral Once [name], MD      . nitroGLYCERIN 50 mg in dextrose 5 % 250 mL (0.2 mg/mL) infusion  2-200 mcg/min Intravenous To OR [name], MD      . phenylephrine (NEO-SYNEPHRINE) 20 mg in sodium chloride 0.9 % 250 mL (0.08 mg/mL) infusion  30-200 mcg/min Intravenous To OR [name], MD      . potassium chloride injection 80 mEq  80 mEq Other To OR [name], MD      . tranexamic acid (CYKLOKAPRON) 2,500 mg in sodium chloride 0.9 % 250 mL (10 mg/mL) infusion  1.5 mg/kg/hr Intravenous To OR [name], MD      . tranexamic acid (CYKLOKAPRON) bolus via infusion - over 30 minutes 1,177.5 mg  15 mg/kg Intravenous To OR  [name], MD      . tranexamic acid (CYKLOKAPRON) pump prime solution 157 mg  2 mg/kg Intracatheter To OR [name], MD      . vancomycin  (VANCOCIN) 1,250 mg in sodium chloride 0.9 % 250 mL IVPB  1,250 mg Intravenous To OR [name], MD       Facility-Administered Medications Ordered in Other Encounters  Medication Dose Route Frequency Provider Last Rate Last Dose  . fentaNYL (SUBLIMAZE) injection    Anesthesia Intra-op Nils Pyle, CRNA   50 mcg at 04/29/17 0645  . lactated ringers infusion    Continuous PRN Nils Pyle, CRNA      . lactated ringers infusion    Continuous PRN Nils Pyle, CRNA      . midazolam (VERSED) 5 MG/5ML injection   Intravenous Anesthesia Intra-op Nils Pyle, CRNA   1 mg at 04/29/17 1610     Review of Systems:  No changes since last visit 4 weeks ago    Cardiac Review of Systems: Y or N  Chest Pain [  n  ]  Resting SOB [ y  ] Exertional SOB  Cove.Etienne  ]  Orthopnea [ y ]   Pedal Edema Cove.Etienne   ]    Palpitations Milo.Brash  ] Syncope  Milo.Brash  ]   Presyncope [ n  ]  General Review of Systems: [Y] = yes [  ]=no Constitional: recent weight change [ n ];  Wt loss over the last 3 months [   ] anorexia [  ]; fatigue Cove.Etienne  ]; nausea [  ]; night sweats [  ]; fever [  ]; or chills [  ];          Dental: poor dentition[  ]; Last Dentist visit:   Eye : blurred vision [  ]; diplopia [   ]; vision changes [  ];  Amaurosis fugax[  ]; Resp: cough [ n ];  wheezing[ n ];  hemoptysis[n  ]; shortness of breath[y  ]; paroxysmal nocturnal dyspnea[y  ]; dyspnea on exertion[y  ]; or orthopnea[ y ];  GI:  gallstones[  ], vomiting[  ];  dysphagia[  ]; melena[  ];  hematochezia [  ]; heartburn[  ];   Hx of  Colonoscopy[  ]; GU: kidney stones [  ]; hematuria[  ];   dysuria [  ];  nocturia[  ];  history of     obstruction [  ]; urinary frequency [  ]             Skin: rash, swelling[  ];, hair loss[  ];  peripheral edema[y  ];  or itching[  ]; Musculosketetal: myalgias[ y ];  joint swelling[y  ];  joint erythema[  ];  joint pain[  ];  back pain[  ];  Heme/Lymph: bruising[  ];  bleeding[  ];  anemia[  ];  Neuro: TIA[  ];  headaches[  ];   stroke[  ];  vertigo[  ];  seizures[n  ];   paresthesias[  ];  difficulty walking[y  ];  Psych:depression[  ]; anxiety[  ];  Endocrine: diabetes[  ];  thyroid dysfunction[  ];  Immunizations: Flu up to date Milo.Brash  ]; Pneumococcal up to date Milo.Brash  ];  Other:  Physical Exam: BP 127/79   Pulse 71   Temp 97.8 F (36.6 C) (Oral)   Resp 16   Wt 173 lb (78.5 kg)   SpO2  99%   BMI 24.82 kg/m   PHYSICAL EXAMINATION: General appearance: alert,no distress Neck: no adenopathy, no carotid bruit, no JVD, supple, symmetrical, trachea midline and thyroid not enlarged, symmetric, no tenderness/mass/nodules Lymph nodes: no palpable Cervical, supraclavicular, and axillary nodes noted. Resp: diminished breath sounds bibasilar Back:  No CVA tenderness. Cardio: regular rate and rhythm, ejection click present and no M of AI GI: soft, non-tender; bowel sounds normal; no masses,  no organomegaly Extremities: edema 2-right greater then left improved since last exam, Homans sign is negative, no sign of DVT, varicose veins noted and venous stasis dermatitis noted Neurologic: Grossly normal Sternum stable and healed   Diagnostic Studies & Laboratory data:     Recent Radiology Findings:   Nm Pet Image Initial (pi) Skull Base To Thigh  Result Date: 02/21/2017 CLINICAL DATA:  Initial treatment strategy for left upper lobe mass. EXAM: NUCLEAR MEDICINE PET SKULL BASE TO THIGH TECHNIQUE: 7.4 mCi F-18 FDG was injected intravenously. Full-ring PET imaging was performed from the skull base to thigh after the radiotracer. CT data was obtained and used for attenuation correction and anatomic localization. FASTING BLOOD GLUCOSE:  Value: 110 mg/dl COMPARISON:  16/08/9603 chest CT FINDINGS: NECK No hypermetabolic lymph nodes in the neck. Carotid atherosclerotic calcifications. Physiologic symmetric activity in the lower neck strap musculature. CHEST The primarily fatty 2.7 by 2.3 cm left upper lobe suprahilar nodule has a maximum  SUV of 2.2. Background mediastinal blood pool activity is 2.2. This nodule is occluding various left upper lobe tracheobronchial branches with the exception of the lingular bronchi. Small mediastinal lymph nodes are not hypermetabolic. Mild airway thickening. Possible small broncholiths in the left lower lobe along with some peripheral airway plugging, unchanged from prior. Ascending thoracic aortic aneurysm. Aortic valve prosthesis. Coronary, aortic arch, and branch vessel atherosclerotic vascular disease. ABDOMEN/PELVIS No abnormal hypermetabolic activity within the liver, pancreas, adrenal glands, or spleen. No hypermetabolic lymph nodes in the abdomen or pelvis. Left scrotal hydrocele. 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 Calcifications in the renal hilum bilaterally are thought to be vascular. Prominent stool throughout the colon favors constipation. SKELETON No focal hypermetabolic activity to suggest skeletal metastasis. Thoracolumbar spondylosis. IMPRESSION: 1. Primarily fatty 2.7 by 2.3 cm left upper lobe suprahilar nodule is not hypermetabolic. 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 However, this does appear to be obstructing several segmental left upper lobe bronchi. 2. Mild airway thickening. Chronic peripheral airway plugging in parts of the left lower lobe possibly with some small broncholiths. 3. Stable appearance of ascending thoracic aortic aneurysm with aortic valve prosthesis. 4. Other imaging findings of potential clinical significance: Coronary, aortic arch, and branch vessel atherosclerotic vascular disease. Aortoiliac atherosclerotic vascular disease. Prominent stool throughout the colon favors constipation. Left scrotal hydrocele. 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 Electronically Signed   By: Gaylyn Rong M.D.   On: 02/21/2017 08:53   Ct Angio Chest Aorta W &/or Wo Contrast  Result Date:  02/01/2017 CLINICAL DATA:  Abnormal chest x-ray on 02/28. Former smoker. History of CHF. EXAM: CT ANGIOGRAPHY CHEST WITH CONTRAST TECHNIQUE: Multidetector CT imaging of the chest was performed using the standard protocol during bolus administration of intravenous contrast. Multiplanar CT image reconstructions and MIPs were obtained to evaluate the vascular anatomy. CONTRAST:  100 cc Isovue 300 COMPARISON:  Chest x-ray dated 01/09/2017. FINDINGS: Cardiovascular: Prominent aneurysm of the ascending thoracic aorta, measuring 6.3 cm diameter. Thoracic aortic arch and descending thoracic aorta are normal in caliber. Mild atherosclerotic  change. Heart size is normal. No pericardial effusion. Diffuse coronary artery calcifications. Mediastinum/Nodes: No mass or enlarged lymph nodes within the mediastinum. Esophagus appears normal. Trachea and central bronchi are unremarkable. Lungs/Pleura: Mixed density mass, with internal fat density, is seen within the left upper lobe, suprahilar, measuring 3.2 x 2.7 x 2.3 cm, corresponding to the finding on recent chest x-ray. Mild scarring/atelectasis at each lung base.  No pleural effusion. Upper Abdomen: No acute abnormality. Musculoskeletal: Mild degenerative spurring throughout the kyphotic thoracolumbar spine. Median sternotomy wires in place. No acute or suspicious osseous finding. Review of the MIP images confirms the above findings. IMPRESSION: 1. Circumscribed mixed density mass, with partial fat density, located within the central aspects of the left upper lobe, suprahilar, measuring 3.2 cm greatest dimension, corresponding to the finding on recent chest x-ray. 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 As there are no prior studies for comparison, recommend follow-up chest CT in 3-6 months to ensure stability. 2. Ascending thoracic aortic aneurysm measuring 6.3 cm diameter. Recommend semi-annual imaging followup by CTA or MRA and referral to cardiothoracic surgery  if not already obtained. This recommendation follows 2010 ACCF/AHA/AATS/ACR/ASA/SCA/SCAI/SIR/STS/SVM Guidelines for the Diagnosis and Management of Patients With Thoracic Aortic Disease. Circulation. 2010; 121: e266-e369TAA. Aortic valve hardware in place. 3. Heart size is normal.  Diffuse coronary artery calcifications. 4. Aortic atherosclerosis. Electronically Signed   By: Bary Richard M.D.   On: 02/01/2017 16:36     I have independently reviewed the above radiologic studies.  Recent Lab Findings: Lab Results  Component Value Date   WBC 8.2 04/25/2017   HGB 13.6 04/25/2017   HCT 41.0 04/25/2017   PLT 178 04/25/2017   GLUCOSE 104 (H) 04/25/2017   CHOL 199 01/22/2017   TRIG 71 01/22/2017   HDL 64 01/22/2017   LDLCALC 121 (H) 01/22/2017   ALT 10 (L) 04/25/2017   AST 20 04/25/2017   NA 133 (L) 04/25/2017   K 4.0 04/25/2017   CL 101 04/25/2017   CREATININE 1.12 04/25/2017   BUN 15 04/25/2017   CO2 23 04/25/2017   TSH 5.390 (H) 01/22/2017   INR 1.16 04/25/2017   HGBA1C 5.7 (H) 04/25/2017   CATH: Procedures   Right/Left Heart Cath and Coronary Angiography  Conclusion     Ost LAD to Prox LAD lesion, 30 %stenosed.  Prox Cx to Mid Cx lesion, 30 %stenosed.  Mid RCA lesion, 20 %stenosed.   1. Nonobstructive CAD 2. Normal right heart and PCWP 3. Normal cardiac output. 4. By fluoro the AV disc moves well.   Plan: will increase Coreg to 6.25 mg bid and add losartan 25 mg daily for optimal CHF therapy. Plan Aortic grafting for aneurysm.    Indications   Thoracic aortic aneurysm without rupture (HCC) [I71.2 (ICD-10-CM)]  Procedural Details/Technique   Technical Details Indication: 71 yo WM s/p AVR in remote past, dilated CM, and newly diagnosed thoracic aortic aneurysm.  Procedural Details: The right groin was prepped, draped, and anesthetized with 1% lidocaine. Using the modified Seldinger technique a 5 Fr sheath was placed in the right femoral artery and a 7 French  sheath was placed in the right femoral vein. A Swan-Ganz catheter was used for the right heart catheterization. Standard protocol was followed for recording of right heart pressures and sampling of oxygen saturations. Fick cardiac output was calculated. Standard Judkins catheters were used for selective coronary angiography and left ventriculography. There were no immediate procedural complications. The patient was transferred to the post  catheterization recovery area for further monitoring.  Contrast: 60 cc   Estimated blood loss <50 mL.  During this procedure no sedation was administered.    Complications   Complications documented before study signed (03/05/2017 2:59 PM EDT)    No complications were associated with this study.  Documented by Sw[place], Peter M, MD - 03/05/2017 2:59 PM EDT    Coronary Findings   Dominance: Right  Left Main  Vessel was injected. Vessel is normal in caliber. Vessel is angiographically normal.  Left Anterior Descending  Ost LAD to Prox LAD lesion, 30% stenosed. The lesion is severely calcified.  Left Circumflex  Prox Cx to Mid Cx lesion, 30% stenosed. The lesion is moderately calcified.  Right Coronary Artery  Vessel is large. The vessel is moderately ectatic.  Mid RCA lesion, 20% stenosed.  Coronary Diagrams   Diagnostic Diagram       Implants     No implant documentation for this case.  PACS Images   Show images for Cardiac catheterization   Link to Procedure Log   Procedure Log    Hemo Data    Most Recent Value  Fick Cardiac Output 4.86 L/min  Fick Cardiac Output Index 2.69 (L/min)/BSA  RA A Wave 5 mmHg  RA V Wave 5 mmHg  RA Mean 3 mmHg  RV Systolic Pressure 29 mmHg  RV Diastolic Pressure 0 mmHg  RV EDP 3 mmHg  PA Systolic Pressure 28 mmHg  PA Diastolic Pressure 9 mmHg  PA Mean 17 mmHg  PW A Wave 11 mmHg  PW V Wave 9 mmHg  PW Mean 10 mmHg  AO Systolic Pressure 126 mmHg  AO Diastolic Pressure 66 mmHg  AO Mean 90 mmHg    QP/QS 1  TPVR Index 6.34 HRUI  TSVR Index 33.54 HRUI  PVR SVR Ratio 0.08  TPVR/TSVR Ratio 0.19      [facility]: [facility]*                  *[facility]*                          [address]                        [place], [place] [address]                            [phone]  ------------------------------------------------------------------- Echocardiography  Patient:    Hanif, Radin MR #:       [id number] Study Date: 02/08/2017 Gender:     M Age:        71 Height:     177.8 cm Weight:     64.8 kg BSA:        1.78 m^2 Pt. Status: Room:   ATTENDING    Peter Swaziland, M.D.  ORDERING     Peter [place], M.D.  REFERRING    Peter [place], M.D.  SONOGRAPHER  Nolon Rod, RDCS  PERFORMING   [facility], Outpatient  cc:  ------------------------------------------------------------------- LV EF: 25% -   30%  ------------------------------------------------------------------- Indications:      Aortic stenosis 424.1.  ------------------------------------------------------------------- History:   PMH:   Aortic valve disease.  Risk factors:  Former tobacco use.  ------------------------------------------------------------------- Study Conclusions  - Left ventricle: The cavity size was mildly dilated. Wall   thickness was normal. Systolic function was severely reduced. The  estimated ejection fraction was in the range of 25% to 30%.   Diffuse hypokinesis. Doppler parameters are consistent with   abnormal left ventricular relaxation (grade 1 diastolic   dysfunction). - Aortic valve: Mechanical aortic valve, well seated Vickki Hearing). Peak   velocity (S): 142 cm/s. - Aorta: CT scan report 6.3cm aneursym of thoracic aorta. Aortic   root dimension: 42 mm (ED) at the sinus of Valsalva and 6.65cm   ascending root. - Ascending aorta: The ascending aorta was severely dilated. - Left atrium: The atrium was mildly dilated. Volume/bsa, S: 40.8   ml/m^2. -  Pulmonary arteries: Systolic pressure was mildly increased. PA   peak pressure: 38 mm Hg (S).  Recommendations:  This procedure has been discussed with the referring physician.  ------------------------------------------------------------------- Labs, prior tests, procedures, and surgery: Aortic valve replacement with a mechanical valve.  ------------------------------------------------------------------- Study data:   Study status:  Routine.  Study completion:  There were no complications.          Echocardiography.  M-mode, complete 2D, spectral Doppler, and color Doppler.  Birthdate:  Patient birthdate: [birth year].  Age:  Patient is 71 yr old.  Sex:  Gender: male.    BMI: 20.5 kg/m^2.  Blood pressure:     106/60  Patient status:  Outpatient.  Study date:  Study date: 02/08/2017. Study time: 10:50 AM.  Location:  [facility].  -------------------------------------------------------------------  ------------------------------------------------------------------- Left ventricle:  The cavity size was mildly dilated. Wall thickness was normal. Systolic function was severely reduced. The estimated ejection fraction was in the range of 25% to 30%. Diffuse hypokinesis. Doppler parameters are consistent with abnormal left ventricular relaxation (grade 1 diastolic dysfunction).  ------------------------------------------------------------------- Aortic valve:  Mechanical aortic valve, well seated Vickki Hearing). Doppler:  Transvalvular velocity was within the normal range. There was no stenosis. There was no regurgitation.    VTI ratio of LVOT to aortic valve: 1.02. Peak velocity ratio of LVOT to aortic valve: 0.97. Mean velocity ratio of LVOT to aortic valve: 1.03.    Mean gradient (S): 5 mm Hg. Peak gradient (S): 8 mm Hg.  ------------------------------------------------------------------- Aorta:  Ascending aorta: The ascending aorta was severely  dilated.  ------------------------------------------------------------------- Mitral valve:   Structurally normal valve.   Leaflet separation was normal.  Doppler:  Transvalvular velocity was within the normal range. There was no evidence for stenosis. There was no regurgitation.  ------------------------------------------------------------------- Left atrium:  The atrium was mildly dilated.  ------------------------------------------------------------------- Right ventricle:  The cavity size was normal. Wall thickness was normal. Systolic function was normal.  ------------------------------------------------------------------- Pulmonic valve:   Poorly visualized.  Structurally normal valve. Cusp separation was normal.  Doppler:  Transvalvular velocity was within the normal range. There was trivial regurgitation.  ------------------------------------------------------------------- Tricuspid valve:   Structurally normal valve.   Leaflet separation was normal.  Doppler:  Transvalvular velocity was within the normal range. There was mild regurgitation.  ------------------------------------------------------------------- Pulmonary artery:   The main pulmonary artery was normal-sized. Systolic pressure was mildly increased.  ------------------------------------------------------------------- Right atrium:  The atrium was normal in size.  ------------------------------------------------------------------- Pericardium:  The pericardium was normal in appearance.  ------------------------------------------------------------------- Systemic veins: Inferior vena cava: The vessel was dilated. The respirophasic diameter changes were blunted (< 50%), consistent with elevated central venous pressure.  ------------------------------------------------------------------- Measurements   Left ventricle                          Value        Reference  LV ID, ED,  PLAX chordal           (H)    63.5  mm     43 - 52  LV ID, ES, PLAX chordal          (H)    58    mm     23 - 38  LV fx shortening, PLAX chordal   (L)    9     %      >=29  LV PW thickness, ED                     8.48  mm     ----------  IVS/LV PW ratio, ED                     0.94         <=1.3  LV ejection fraction, 1-p A4C           44    %      ----------  LV end-diastolic volume, 2-p            199   ml     ----------  LV end-systolic volume, 2-p             119   ml     ----------  LV ejection fraction, 2-p               40    %      ----------  Stroke volume, 2-p                      80    ml     ----------  LV end-diastolic volume/bsa, 2-p        112   ml/m^2 ----------  LV end-systolic volume/bsa, 2-p         67    ml/m^2 ----------  Stroke volume/bsa, 2-p                  44.9  ml/m^2 ----------  LV e&', lateral                          8.92  cm/s   ----------  LV E/e&', lateral                        7.78         ----------  LV e&', medial                           5     cm/s   ----------  LV E/e&', medial                         13.88        ----------  LV e&', average                          6.96  cm/s   ----------  LV E/e&', average                        9.97         ----------  Longitudinal strain, TDI                17    %      ----------  Ventricular septum                      Value        Reference  IVS thickness, ED                       7.94  mm     ----------    LVOT                                    Value        Reference  LVOT peak velocity, S                   138   cm/s   ----------  LVOT mean velocity, S                   107   cm/s   ----------  LVOT VTI, S                             29.2  cm     ----------  LVOT peak gradient, S                   8     mm Hg  ----------    Aortic valve                            Value        Reference  Aortic valve peak velocity, S           142   cm/s   ----------  Aortic valve mean velocity, S           104   cm/s   ----------   Aortic valve VTI, S                     28.5  cm     ----------  Aortic mean gradient, S                 5     mm Hg  ----------  Aortic peak gradient, S                 8     mm Hg  ----------  VTI ratio, LVOT/AV                      1.02         ----------  Velocity ratio, peak, LVOT/AV           0.97         ----------  Velocity ratio, mean, LVOT/AV           1.03         ----------    Aorta                                   Value        Reference  Aortic root ID, ED                      42    mm     ----------    Left atrium  Value        Reference  LA ID, A-P, ES                          38    mm     ----------  LA ID/bsa, A-P                          2.13  cm/m^2 <=2.2  LA volume, S                            72.7  ml     ----------  LA volume/bsa, S                        40.8  ml/m^2 ----------  LA volume, ES, 1-p A4C                  59.2  ml     ----------  LA volume/bsa, ES, 1-p A4C              33.2  ml/m^2 ----------  LA volume, ES, 1-p A2C                  85.8  ml     ----------  LA volume/bsa, ES, 1-p A2C              48.1  ml/m^2 ----------    Mitral valve                            Value        Reference  Mitral E-wave peak velocity             69.4  cm/s   ----------  Mitral A-wave peak velocity             68.4  cm/s   ----------  Mitral deceleration time                201   ms     150 - 230  Mitral E/A ratio, peak                  1            ----------  Mitral regurg VTI, PISA                 188   cm     ----------    Pulmonary arteries                      Value        Reference  PA pressure, S, DP               (H)    38    mm Hg  <=30    Tricuspid valve                         Value        Reference  Tricuspid regurg peak velocity          238   cm/s   ----------  Tricuspid peak RV-RA gradient           23    mm Hg  ----------    Right atrium  Value        Reference  RA ID, S-I, ES, A4C               (H)    52    mm     34 - 49  RA area, ES, A4C                        18.2  cm^2   8.3 - 19.5  RA volume, ES, A/L                      53    ml     ----------  RA volume/bsa, ES, A/L                  29.7  ml/m^2 ----------    Systemic veins                          Value        Reference  Estimated CVP                           15    mm Hg  ----------    Right ventricle                         Value        Reference  RV pressure, S, DP               (H)    38    mm Hg  <=30  RV s&', lateral, S                       9.14  cm/s   ----------  Legend: (L)  and  (H)  mark values outside specified reference range.  ------------------------------------------------------------------- Prepared and Electronically Authenticated by  Donato Schultz, M.D. 2018-03-30T12:37:39   Aortic Size Index=   6.3      /Body surface area is 1.97 meters squared. 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 The estimated ejection fraction was in the range of 25% to 30%. Stage C, Class III     Diffuse hypokinesis. Doppler parameters indicate  abnormal left ventricular relaxation (grade 1 diastolic dysfunction). 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 I had recommended to him that if we needed to replace his aortic valve with the ascending aorta, with his current age it would seem reasonable to put a tissue valve  so he is not committed to Coumadin the remainder of his life.  He notes that he is not opposed to Coumadin but is willing to have a tissue valve should we require replacement.    the patient and his wife are aware of the high risk nature of this suture with the redo status, his poor underlying ventricular function. Risks of death infection stroke myocardial infarction bleeding blood transfusion and need for permanent pacemaker have all been discussed.  I have contacted the [facility] recall  Administrator  and confirmed the serial number of the patients valve is not included in the [facility] CC valve recall in the 1980's   The goals risks and alternatives of the planned surgical procedure Procedure(s): REDO STERNOTOMY (N/A) BENTALL PROCEDURE (N/A) TRANSESOPHAGEAL ECHOCARDIOGRAM (TEE) (N/A)  have been discussed with the patient in detail. The risks of the procedure including death, infection, stroke, myocardial infarction, bleeding, blood transfusion have all been discussed specifically.  I have quoted [name] a 6 % of perioperative mortality and a complication rate as high as 50 %. The patient's questions have been answered.Hillery Aldo Boberg is willing  to proceed with the planned procedure.  [name] MD      [address] [place] [place] [address] Office [phone]   Beeper [phone]  04/29/2017 7:02 AM

## 2017-04-29 NOTE — Transfer of Care (Signed)
Immediate Anesthesia Transfer of Care Note  Patient: Jack Joseph  Procedure(s) Performed: Procedure(s): REDO STERNOTOMY (N/A) BENTALL PROCEDURE USING HEMASHIELD PLATINUM WOVEN DOUBLE VELOUR VASCULAR GRAFT 32 MM X 50 CM; CIRCULATORY ARREST (N/A) TRANSESOPHAGEAL ECHOCARDIOGRAM (TEE) (N/A)  Patient Location: ICU  Anesthesia Type:General  Level of Consciousness: sedated, unresponsive and Patient remains intubated per anesthesia plan  Airway & Oxygen Therapy: Patient remains intubated per anesthesia plan and Patient placed on Ventilator (see vital sign flow sheet for setting)  Post-op Assessment: Report given to RN and Post -op Vital signs reviewed and stable  Post vital signs: Reviewed and stable  Last Vitals:  Vitals:   04/29/17 0554  BP: 127/79  Pulse: 71  Resp: 16  Temp: 36.6 C    Last Pain:  Vitals:   04/29/17 0554  TempSrc: Oral      Patients Stated Pain Goal: 3 (04/29/17 0609)  Complications: No apparent anesthesia complications

## 2017-04-29 NOTE — H&P (Signed)
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    Patient had replacement of his aortic valve with a  with  27 mm Bjork-Shiley tilting disk valve  27 [id number] in 1983 for Aortic Insufficiency, probability a bicuspid valve. In 2003 echocardiogram noted LV dysfunction 30-35 % and 39 mm aorta just above the cusps and 58 mm at mid ascending aorta. 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 Since the first time at singing his symptoms have markedly improved. He's ambulating without difficulty   He has no known history of MI  I have contacted the [facility] recall  Administrator  and confirmed the serial number of the patients valve is not included in the [facility] CC valve recall in the 1980's. Surgery done by Dr Raenette [facility]     the patient has had cardiac catheterization,   Current Activity/ Functional Status:  Patient is independent with mobility/ambulation, transfers, ADL's, IADL's.   Zubrod Score: At the time of surgery this patient's most appropriate activity status/level should be described as: []     0    Normal activity, no symptoms [x]     1    Restricted in physical strenuous activity but ambulatory, able to do out light work, patient has difficulty walking using crutches  []     2    Ambulatory and capable of self care, unable to do work activities, up and about                >50 % of waking hours                              []     3    Only limited self care, in bed greater than 50% of waking hours []     4    Completely disabled, no self care, confined to bed or chair []     5    Moribund   Past Medical History:  Diagnosis Date  . AVD (aortic valve disease)   . Chronic anticoagulation   . Dilated cardiomyopathy (HCC)    EF 30-35%  . Dysrhythmia   . Hyperlipidemia   . LV dysfunction     Past Surgical History:  Procedure Laterality Date  . AORTIC VALVE REPLACEMENT    . CORONARY ARTERY BYPASS GRAFT    . DOPPLER ECHOCARDIOGRAPHY  04/07/2002   EF 30-35%  . RIGHT/LEFT HEART CATH AND CORONARY ANGIOGRAPHY N/A 03/05/2017   Procedure: Right/Left Heart Cath and Coronary Angiography;  Surgeon: Peter M Swaziland, MD;  Location: [facility] INVASIVE CV LAB;  Service: Cardiovascular;  Laterality: N/A;    Family History  Problem Relation Age of Onset  .  Heart disease Brother     Social History   Social History  . Marital status: Married    Spouse name: N/A  . Number of children: N/A  . Years of education: N/A   Occupational History  . Not on file.   Social History Main Topics  . Smoking status: Former Smoker    Quit date: 11/12/2000  . Smokeless tobacco: Never Used  . Alcohol use No  . Drug use: No  . Sexual activity: Yes   Other Topics Concern  . Not on file   Social History Narrative  . No narrative on file    History  Smoking Status  . Former Smoker  . Quit date: 11/12/2000  Smokeless Tobacco  . Never Used    History  Alcohol Use No     Allergies  Allergen Reactions  . No Known Allergies     Current Facility-Administered Medications  Medication Dose Route Frequency Provider Last Rate Last Dose  . 0.9 %  sodium chloride infusion   Intravenous Once Nils Pyle, CRNA      . cefUROXime (ZINACEF) 1.5 g in dextrose 5 % 50 mL IVPB  1.5 g Intravenous To OR [name], MD      . cefUROXime (ZINACEF) 750 mg in dextrose 5 % 50 mL IVPB   750 mg Intravenous To OR [name], MD      . chlorhexidine (HIBICLENS) 4 % liquid 2 application  30 mL Topical UD [name], MD      . dexmedetomidine (PRECEDEX) 400 MCG/100ML (4 mcg/mL) infusion  0.1-0.7 mcg/kg/hr Intravenous To OR [name], MD      . DOPamine (INTROPIN) 800 mg in dextrose 5 % 250 mL (3.2 mg/mL) infusion  0-10 mcg/kg/min Intravenous To OR [name], MD      . EPINEPHrine (ADRENALIN) 4 mg in dextrose 5 % 250 mL (0.016 mg/mL) infusion  0-10 mcg/min Intravenous To OR [name], MD      . heparin 2,500 Units, papaverine 30 mg in electrolyte-148 (PLASMALYTE-148) 500 mL irrigation   Irrigation To OR [name], MD      . heparin 30,000 units/NS 1000 mL solution for CELLSAVER   Other To OR [name], MD      . insulin regular (NOVOLIN R,HUMULIN R) 100 Units in sodium chloride 0.9 % 100 mL (1 Units/mL) infusion   Intravenous To OR [name], MD      . magnesium sulfate (IV Push/IM) injection 40 mEq  40 mEq Other To OR [name], MD      . metoprolol tartrate (LOPRESSOR) tablet 12.5 mg  12.5 mg Oral Once [name], MD      . nitroGLYCERIN 50 mg in dextrose 5 % 250 mL (0.2 mg/mL) infusion  2-200 mcg/min Intravenous To OR [name], MD      . phenylephrine (NEO-SYNEPHRINE) 20 mg in sodium chloride 0.9 % 250 mL (0.08 mg/mL) infusion  30-200 mcg/min Intravenous To OR [name], MD      . potassium chloride injection 80 mEq  80 mEq Other To OR [name], MD      . tranexamic acid (CYKLOKAPRON) 2,500 mg in sodium chloride 0.9 % 250 mL (10 mg/mL) infusion  1.5 mg/kg/hr Intravenous To OR [name], MD      . tranexamic acid (CYKLOKAPRON) bolus via infusion - over 30 minutes 1,177.5 mg  15 mg/kg Intravenous To OR  [name], MD      . tranexamic acid (CYKLOKAPRON) pump prime solution 157 mg  2 mg/kg Intracatheter To OR [name], MD      . vancomycin  (VANCOCIN) 1,250 mg in sodium chloride 0.9 % 250 mL IVPB  1,250 mg Intravenous To OR [name], MD       Facility-Administered Medications Ordered in Other Encounters  Medication Dose Route Frequency Provider Last Rate Last Dose  . fentaNYL (SUBLIMAZE) injection    Anesthesia Intra-op Nils Pyle, CRNA   50 mcg at 04/29/17 0645  . lactated ringers infusion    Continuous PRN Nils Pyle, CRNA      . lactated ringers infusion    Continuous PRN Nils Pyle, CRNA      . lactated ringers infusion    Continuous PRN Nils Pyle, CRNA      . midazolam (VERSED) 5 MG/5ML injection   Intravenous Anesthesia Intra-op Nils Pyle, CRNA   1 mg at 04/29/17 1610     Review of Systems:  No changes since last visit 4 weeks ago    Cardiac Review of Systems: Y or N  Chest Pain [  n  ]  Resting SOB [ y  ] Exertional SOB  Cove.Etienne  ]  Orthopnea [ y ]   Pedal Edema Cove.Etienne   ]    Palpitations Milo.Brash  ] Syncope  Milo.Brash  ]   Presyncope [ n  ]  General Review of Systems: [Y] = yes [  ]=no Constitional: recent weight change [ n ];  Wt loss over the last 3 months [   ] anorexia [  ]; fatigue Cove.Etienne  ]; nausea [  ]; night sweats [  ]; fever [  ]; or chills [  ];          Dental: poor dentition[  ]; Last Dentist visit:   Eye : blurred vision [  ]; diplopia [   ]; vision changes [  ];  Amaurosis fugax[  ]; Resp: cough [ n ];  wheezing[ n ];  hemoptysis[n  ]; shortness of breath[y  ]; paroxysmal nocturnal dyspnea[y  ]; dyspnea on exertion[y  ]; or orthopnea[ y ];  GI:  gallstones[  ], vomiting[  ];  dysphagia[  ]; melena[  ];  hematochezia [  ]; heartburn[  ];   Hx of  Colonoscopy[  ]; GU: kidney stones [  ]; hematuria[  ];   dysuria [  ];  nocturia[  ];  history of     obstruction [  ]; urinary frequency [  ]             Skin: rash, swelling[  ];, hair loss[  ];  peripheral edema[y  ];  or itching[  ]; Musculosketetal: myalgias[ y ];  joint swelling[y  ];  joint erythema[  ];  joint pain[  ];  back pain[  ];  Heme/Lymph:  bruising[  ];  bleeding[  ];  anemia[  ];  Neuro: TIA[  ];  headaches[  ];  stroke[  ];  vertigo[  ];  seizures[n  ];   paresthesias[  ];  difficulty walking[y  ];  Psych:depression[  ]; anxiety[  ];  Endocrine: diabetes[  ];  thyroid dysfunction[  ];  Immunizations: Flu up to date Milo.Brash  ]; Pneumococcal up to date Milo.Brash  ];  Other:  Physical Exam: BP 127/79   Pulse 71   Temp 97.8  F (36.6 C) (Oral)   Resp 16   Wt 173 lb (78.5 kg)   SpO2 99%   BMI 24.82 kg/m   PHYSICAL EXAMINATION: General appearance: alert,no distress Neck: no adenopathy, no carotid bruit, no JVD, supple, symmetrical, trachea midline and thyroid not enlarged, symmetric, no tenderness/mass/nodules Lymph nodes: no palpable Cervical, supraclavicular, and axillary nodes noted. Resp: diminished breath sounds bibasilar Back:  No CVA tenderness. Cardio: regular rate and rhythm, ejection click present and no M of AI GI: soft, non-tender; bowel sounds normal; no masses,  no organomegaly Extremities: edema 2-right greater then left improved since last exam, Homans sign is negative, no sign of DVT, varicose veins noted and venous stasis dermatitis noted Neurologic: Grossly normal Sternum stable and healed   Diagnostic Studies & Laboratory data:     Recent Radiology Findings:   Nm Pet Image Initial (pi) Skull Base To Thigh  Result Date: 02/21/2017 CLINICAL DATA:  Initial treatment strategy for left upper lobe mass. EXAM: NUCLEAR MEDICINE PET SKULL BASE TO THIGH TECHNIQUE: 7.4 mCi F-18 FDG was injected intravenously. Full-ring PET imaging was performed from the skull base to thigh after the radiotracer. CT data was obtained and used for attenuation correction and anatomic localization. FASTING BLOOD GLUCOSE:  Value: 110 mg/dl COMPARISON:  16/08/9603 chest CT FINDINGS: NECK No hypermetabolic lymph nodes in the neck. Carotid atherosclerotic calcifications. Physiologic symmetric activity in the lower neck strap musculature. CHEST The  primarily fatty 2.7 by 2.3 cm left upper lobe suprahilar nodule has a maximum SUV of 2.2. Background mediastinal blood pool activity is 2.2. This nodule is occluding various left upper lobe tracheobronchial branches with the exception of the lingular bronchi. Small mediastinal lymph nodes are not hypermetabolic. Mild airway thickening. Possible small broncholiths in the left lower lobe along with some peripheral airway plugging, unchanged from prior. Ascending thoracic aortic aneurysm. Aortic valve prosthesis. Coronary, aortic arch, and branch vessel atherosclerotic vascular disease. ABDOMEN/PELVIS No abnormal hypermetabolic activity within the liver, pancreas, adrenal glands, or spleen. No hypermetabolic lymph nodes in the abdomen or pelvis. Left scrotal hydrocele. 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 Calcifications in the renal hilum bilaterally are thought to be vascular. Prominent stool throughout the colon favors constipation. SKELETON No focal hypermetabolic activity to suggest skeletal metastasis. Thoracolumbar spondylosis. IMPRESSION: 1. Primarily fatty 2.7 by 2.3 cm left upper lobe suprahilar nodule is not hypermetabolic. 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 However, this does appear to be obstructing several segmental left upper lobe bronchi. 2. Mild airway thickening. Chronic peripheral airway plugging in parts of the left lower lobe possibly with some small broncholiths. 3. Stable appearance of ascending thoracic aortic aneurysm with aortic valve prosthesis. 4. Other imaging findings of potential clinical significance: Coronary, aortic arch, and branch vessel atherosclerotic vascular disease. Aortoiliac atherosclerotic vascular disease. Prominent stool throughout the colon favors constipation. Left scrotal hydrocele. 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 Electronically Signed   By: Gaylyn Rong M.D.   On:  02/21/2017 08:53   Ct Angio Chest Aorta W &/or Wo Contrast  Result Date: 02/01/2017 CLINICAL DATA:  Abnormal chest x-ray on 02/28. Former smoker. History of CHF. EXAM: CT ANGIOGRAPHY CHEST WITH CONTRAST TECHNIQUE: Multidetector CT imaging of the chest was performed using the standard protocol during bolus administration of intravenous contrast. Multiplanar CT image reconstructions and MIPs were obtained to evaluate the vascular anatomy. CONTRAST:  100 cc Isovue 300 COMPARISON:  Chest x-ray dated 01/09/2017. FINDINGS: Cardiovascular: Prominent aneurysm of the ascending thoracic  aorta, measuring 6.3 cm diameter. Thoracic aortic arch and descending thoracic aorta are normal in caliber. Mild atherosclerotic change. Heart size is normal. No pericardial effusion. Diffuse coronary artery calcifications. Mediastinum/Nodes: No mass or enlarged lymph nodes within the mediastinum. Esophagus appears normal. Trachea and central bronchi are unremarkable. Lungs/Pleura: Mixed density mass, with internal fat density, is seen within the left upper lobe, suprahilar, measuring 3.2 x 2.7 x 2.3 cm, corresponding to the finding on recent chest x-ray. Mild scarring/atelectasis at each lung base.  No pleural effusion. Upper Abdomen: No acute abnormality. Musculoskeletal: Mild degenerative spurring throughout the kyphotic thoracolumbar spine. Median sternotomy wires in place. No acute or suspicious osseous finding. Review of the MIP images confirms the above findings. IMPRESSION: 1. Circumscribed mixed density mass, with partial fat density, located within the central aspects of the left upper lobe, suprahilar, measuring 3.2 cm greatest dimension, corresponding to the finding on recent chest x-ray. 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 As there are no prior studies for comparison, recommend follow-up chest CT in 3-6 months to ensure stability. 2. Ascending thoracic aortic aneurysm measuring 6.3 cm diameter. Recommend  semi-annual imaging followup by CTA or MRA and referral to cardiothoracic surgery if not already obtained. This recommendation follows 2010 ACCF/AHA/AATS/ACR/ASA/SCA/SCAI/SIR/STS/SVM Guidelines for the Diagnosis and Management of Patients With Thoracic Aortic Disease. Circulation. 2010; 121: e266-e369TAA. Aortic valve hardware in place. 3. Heart size is normal.  Diffuse coronary artery calcifications. 4. Aortic atherosclerosis. Electronically Signed   By: Bary Richard M.D.   On: 02/01/2017 16:36     I have independently reviewed the above radiologic studies.  Recent Lab Findings: Lab Results  Component Value Date   WBC 8.2 04/25/2017   HGB 13.6 04/25/2017   HCT 41.0 04/25/2017   PLT 178 04/25/2017   GLUCOSE 104 (H) 04/25/2017   CHOL 199 01/22/2017   TRIG 71 01/22/2017   HDL 64 01/22/2017   LDLCALC 121 (H) 01/22/2017   ALT 10 (L) 04/25/2017   AST 20 04/25/2017   NA 133 (L) 04/25/2017   K 4.0 04/25/2017   CL 101 04/25/2017   CREATININE 1.12 04/25/2017   BUN 15 04/25/2017   CO2 23 04/25/2017   TSH 5.390 (H) 01/22/2017   INR 1.16 04/25/2017   HGBA1C 5.7 (H) 04/25/2017   CATH: Procedures   Right/Left Heart Cath and Coronary Angiography  Conclusion     Ost LAD to Prox LAD lesion, 30 %stenosed.  Prox Cx to Mid Cx lesion, 30 %stenosed.  Mid RCA lesion, 20 %stenosed.   1. Nonobstructive CAD 2. Normal right heart and PCWP 3. Normal cardiac output. 4. By fluoro the AV disc moves well.   Plan: will increase Coreg to 6.25 mg bid and add losartan 25 mg daily for optimal CHF therapy. Plan Aortic grafting for aneurysm.    Indications   Thoracic aortic aneurysm without rupture (HCC) [I71.2 (ICD-10-CM)]  Procedural Details/Technique   Technical Details Indication: 71 yo WM s/p AVR in remote past, dilated CM, and newly diagnosed thoracic aortic aneurysm.  Procedural Details: The right groin was prepped, draped, and anesthetized with 1% lidocaine. Using the modified Seldinger  technique a 5 Fr sheath was placed in the right femoral artery and a 7 French sheath was placed in the right femoral vein. A Swan-Ganz catheter was used for the right heart catheterization. Standard protocol was followed for recording of right heart pressures and sampling of oxygen saturations. Fick cardiac output was calculated. Standard Judkins catheters were used for selective  coronary angiography and left ventriculography. There were no immediate procedural complications. The patient was transferred to the post catheterization recovery area for further monitoring.  Contrast: 60 cc   Estimated blood loss <50 mL.  During this procedure no sedation was administered.    Complications   Complications documented before study signed (03/05/2017 2:59 PM EDT)    No complications were associated with this study.  Documented by Swaziland, Peter M, MD - 03/05/2017 2:59 PM EDT    Coronary Findings   Dominance: Right  Left Main  Vessel was injected. Vessel is normal in caliber. Vessel is angiographically normal.  Left Anterior Descending  Ost LAD to Prox LAD lesion, 30% stenosed. The lesion is severely calcified.  Left Circumflex  Prox Cx to Mid Cx lesion, 30% stenosed. The lesion is moderately calcified.  Right Coronary Artery  Vessel is large. The vessel is moderately ectatic.  Mid RCA lesion, 20% stenosed.  Coronary Diagrams   Diagnostic Diagram       Implants     No implant documentation for this case.  PACS Images   Show images for Cardiac catheterization   Link to Procedure Log   Procedure Log    Hemo Data    Most Recent Value  Fick Cardiac Output 4.86 L/min  Fick Cardiac Output Index 2.69 (L/min)/BSA  RA A Wave 5 mmHg  RA V Wave 5 mmHg  RA Mean 3 mmHg  RV Systolic Pressure 29 mmHg  RV Diastolic Pressure 0 mmHg  RV EDP 3 mmHg  PA Systolic Pressure 28 mmHg  PA Diastolic Pressure 9 mmHg  PA Mean 17 mmHg  PW A Wave 11 mmHg  PW V Wave 9 mmHg  PW Mean 10 mmHg  AO  Systolic Pressure 126 mmHg  AO Diastolic Pressure 66 mmHg  AO Mean 90 mmHg  QP/QS 1  TPVR Index 6.34 HRUI  TSVR Index 33.54 HRUI  PVR SVR Ratio 0.08  TPVR/TSVR Ratio 0.19      [facility]: [facility]*                  *[facility]*                          [address]                        [place], [place] [address]                            [phone]  ------------------------------------------------------------------- Echocardiography  Patient:    Phat, Dalton MR #:       [id number] Study Date: 02/08/2017 Gender:     M Age:        45 Height:     177.8 cm Weight:     64.8 kg BSA:        1.78 m^2 Pt. Status: Room:   ATTENDING    Peter Swaziland, M.D.  ORDERING     Peter Swaziland, M.D.  REFERRING    Peter Swaziland, M.D.  SONOGRAPHER  Nolon Rod, RDCS  PERFORMING   [facility], Outpatient  cc:  ------------------------------------------------------------------- LV EF: 25% -   30%  ------------------------------------------------------------------- Indications:      Aortic stenosis 424.1.  ------------------------------------------------------------------- History:   PMH:   Aortic valve disease.  Risk factors:  Former tobacco use.  ------------------------------------------------------------------- Study Conclusions  - Left ventricle: The cavity  size was mildly dilated. Wall   thickness was normal. Systolic function was severely reduced. The   estimated ejection fraction was in the range of 25% to 30%.   Diffuse hypokinesis. Doppler parameters are consistent with   abnormal left ventricular relaxation (grade 1 diastolic   dysfunction). - Aortic valve: Mechanical aortic valve, well seated Vickki Hearing). Peak   velocity (S): 142 cm/s. - Aorta: CT scan report 6.3cm aneursym of thoracic aorta. Aortic   root dimension: 42 mm (ED) at the sinus of Valsalva and 6.65cm   ascending root. - Ascending aorta: The ascending aorta was severely dilated. -  Left atrium: The atrium was mildly dilated. Volume/bsa, S: 40.8   ml/m^2. - Pulmonary arteries: Systolic pressure was mildly increased. PA   peak pressure: 38 mm Hg (S).  Recommendations:  This procedure has been discussed with the referring physician.  ------------------------------------------------------------------- Labs, prior tests, procedures, and surgery: Aortic valve replacement with a mechanical valve.  ------------------------------------------------------------------- Study data:   Study status:  Routine.  Study completion:  There were no complications.          Echocardiography.  M-mode, complete 2D, spectral Doppler, and color Doppler.  Birthdate:  Patient birthdate: 03-Mar-1946.  Age:  Patient is 71 yr old.  Sex:  Gender: male.    BMI: 20.5 kg/m^2.  Blood pressure:     106/60  Patient status:  Outpatient.  Study date:  Study date: 02/08/2017. Study time: 10:50 AM.  Location:  [facility].  -------------------------------------------------------------------  ------------------------------------------------------------------- Left ventricle:  The cavity size was mildly dilated. Wall thickness was normal. Systolic function was severely reduced. The estimated ejection fraction was in the range of 25% to 30%. Diffuse hypokinesis. Doppler parameters are consistent with abnormal left ventricular relaxation (grade 1 diastolic dysfunction).  ------------------------------------------------------------------- Aortic valve:  Mechanical aortic valve, well seated Vickki Hearing). Doppler:  Transvalvular velocity was within the normal range. There was no stenosis. There was no regurgitation.    VTI ratio of LVOT to aortic valve: 1.02. Peak velocity ratio of LVOT to aortic valve: 0.97. Mean velocity ratio of LVOT to aortic valve: 1.03.    Mean gradient (S): 5 mm Hg. Peak gradient (S): 8 mm Hg.  ------------------------------------------------------------------- Aorta:   Ascending aorta: The ascending aorta was severely dilated.  ------------------------------------------------------------------- Mitral valve:   Structurally normal valve.   Leaflet separation was normal.  Doppler:  Transvalvular velocity was within the normal range. There was no evidence for stenosis. There was no regurgitation.  ------------------------------------------------------------------- Left atrium:  The atrium was mildly dilated.  ------------------------------------------------------------------- Right ventricle:  The cavity size was normal. Wall thickness was normal. Systolic function was normal.  ------------------------------------------------------------------- Pulmonic valve:   Poorly visualized.  Structurally normal valve. Cusp separation was normal.  Doppler:  Transvalvular velocity was within the normal range. There was trivial regurgitation.  ------------------------------------------------------------------- Tricuspid valve:   Structurally normal valve.   Leaflet separation was normal.  Doppler:  Transvalvular velocity was within the normal range. There was mild regurgitation.  ------------------------------------------------------------------- Pulmonary artery:   The main pulmonary artery was normal-sized. Systolic pressure was mildly increased.  ------------------------------------------------------------------- Right atrium:  The atrium was normal in size.  ------------------------------------------------------------------- Pericardium:  The pericardium was normal in appearance.  ------------------------------------------------------------------- Systemic veins: Inferior vena cava: The vessel was dilated. The respirophasic diameter changes were blunted (< 50%), consistent with elevated central venous pressure.  ------------------------------------------------------------------- Measurements   Left ventricle  Value         Reference  LV ID, ED, PLAX chordal          (H)    63.5  mm     43 - 52  LV ID, ES, PLAX chordal          (H)    58    mm     23 - 38  LV fx shortening, PLAX chordal   (L)    9     %      >=29  LV PW thickness, ED                     8.48  mm     ----------  IVS/LV PW ratio, ED                     0.94         <=1.3  LV ejection fraction, 1-p A4C           44    %      ----------  LV end-diastolic volume, 2-p            199   ml     ----------  LV end-systolic volume, 2-p             119   ml     ----------  LV ejection fraction, 2-p               40    %      ----------  Stroke volume, 2-p                      80    ml     ----------  LV end-diastolic volume/bsa, 2-p        112   ml/m^2 ----------  LV end-systolic volume/bsa, 2-p         67    ml/m^2 ----------  Stroke volume/bsa, 2-p                  44.9  ml/m^2 ----------  LV e&', lateral                          8.92  cm/s   ----------  LV E/e&', lateral                        7.78         ----------  LV e&', medial                           5     cm/s   ----------  LV E/e&', medial                         13.88        ----------  LV e&', average                          6.96  cm/s   ----------  LV E/e&', average                        9.97         ----------  Longitudinal strain, TDI  17    %      ----------    Ventricular septum                      Value        Reference  IVS thickness, ED                       7.94  mm     ----------    LVOT                                    Value        Reference  LVOT peak velocity, S                   138   cm/s   ----------  LVOT mean velocity, S                   107   cm/s   ----------  LVOT VTI, S                             29.2  cm     ----------  LVOT peak gradient, S                   8     mm Hg  ----------    Aortic valve                            Value        Reference  Aortic valve peak velocity, S           142   cm/s   ----------  Aortic valve mean  velocity, S           104   cm/s   ----------  Aortic valve VTI, S                     28.5  cm     ----------  Aortic mean gradient, S                 5     mm Hg  ----------  Aortic peak gradient, S                 8     mm Hg  ----------  VTI ratio, LVOT/AV                      1.02         ----------  Velocity ratio, peak, LVOT/AV           0.97         ----------  Velocity ratio, mean, LVOT/AV           1.03         ----------    Aorta                                   Value        Reference  Aortic root ID, ED                      42  mm     ----------    Left atrium                             Value        Reference  LA ID, A-P, ES                          38    mm     ----------  LA ID/bsa, A-P                          2.13  cm/m^2 <=2.2  LA volume, S                            72.7  ml     ----------  LA volume/bsa, S                        40.8  ml/m^2 ----------  LA volume, ES, 1-p A4C                  59.2  ml     ----------  LA volume/bsa, ES, 1-p A4C              33.2  ml/m^2 ----------  LA volume, ES, 1-p A2C                  85.8  ml     ----------  LA volume/bsa, ES, 1-p A2C              48.1  ml/m^2 ----------    Mitral valve                            Value        Reference  Mitral E-wave peak velocity             69.4  cm/s   ----------  Mitral A-wave peak velocity             68.4  cm/s   ----------  Mitral deceleration time                201   ms     150 - 230  Mitral E/A ratio, peak                  1            ----------  Mitral regurg VTI, PISA                 188   cm     ----------    Pulmonary arteries                      Value        Reference  PA pressure, S, DP               (H)    38    mm Hg  <=30    Tricuspid valve                         Value        Reference  Tricuspid regurg peak velocity          238  cm/s   ----------  Tricuspid peak RV-RA gradient           23    mm Hg  ----------    Right atrium                            Value         Reference  RA ID, S-I, ES, A4C              (H)    52    mm     34 - 49  RA area, ES, A4C                        18.2  cm^2   8.3 - 19.5  RA volume, ES, A/L                      53    ml     ----------  RA volume/bsa, ES, A/L                  29.7  ml/m^2 ----------    Systemic veins                          Value        Reference  Estimated CVP                           15    mm Hg  ----------    Right ventricle                         Value        Reference  RV pressure, S, DP               (H)    38    mm Hg  <=30  RV s&', lateral, S                       9.14  cm/s   ----------  Legend: (L)  and  (H)  mark values outside specified reference range.  ------------------------------------------------------------------- Prepared and Electronically Authenticated by  Donato Schultz, M.D. 2018-03-30T12:37:39   Aortic Size Index=   6.3      /Body surface area is 1.97 meters squared. 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 The estimated ejection fraction was in the range of 25% to 30%. Stage C, Class III     Diffuse hypokinesis. Doppler parameters indicate  abnormal left ventricular relaxation (grade 1 diastolic dysfunction). 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 I had recommended to him that if we needed to replace his aortic valve with the ascending aorta, with his current age it would seem reasonable to put a tissue valve  so he is not committed to Coumadin the remainder of his life.  He notes that he is not opposed to Coumadin but is willing to have a tissue valve should we require replacement.    the patient and his wife are aware of the high risk nature of this suture with the redo status, his poor underlying ventricular function. Risks of death infection stroke myocardial infarction bleeding blood transfusion and need for permanent pacemaker have all been discussed.  I have contacted the [facility] recall  Administrator  and confirmed the serial number of the patients valve is not included in the [facility] CC valve recall in the 1980's   The goals risks and alternatives of the planned surgical procedure Procedure(s): REDO STERNOTOMY (N/A) BENTALL PROCEDURE (N/A) TRANSESOPHAGEAL ECHOCARDIOGRAM (TEE) (N/A)  have been discussed with the patient in detail. The risks of the procedure including death, infection, stroke, myocardial infarction, bleeding, blood transfusion have all been discussed specifically.  I have quoted [name] a 6 % of perioperative mortality and a complication rate as high as 50 %. The patient's questions have been answered.Hillery Aldo Paolella is willing  to proceed with the planned procedure.  [name] MD      [address] [place] [place] [address] Office [phone]   Beeper [phone]  04/29/2017 7:08 AM

## 2017-04-29 NOTE — Anesthesia Procedure Notes (Signed)
Procedure Name: Intubation Date/Time: 04/29/2017 7:49 AM Performed by: Rise Patience T Pre-anesthesia Checklist: Patient identified, Emergency Drugs available, Suction available and Patient being monitored Patient Re-evaluated:Patient Re-evaluated prior to inductionOxygen Delivery Method: Circle System Utilized Preoxygenation: Pre-oxygenation with 100% oxygen Intubation Type: IV induction Ventilation: Mask ventilation without difficulty and Oral airway inserted - appropriate to patient size Laryngoscope Size: Hyacinth Meeker and 2 Grade View: Grade I Tube type: Oral Tube size: 8.0 mm Number of attempts: 1 Airway Equipment and Method: Stylet and Oral airway Placement Confirmation: ETT inserted through vocal cords under direct vision,  positive ETCO2 and breath sounds checked- equal and bilateral Secured at: 21 cm Tube secured with: Tape Dental Injury: Teeth and Oropharynx as per pre-operative assessment

## 2017-04-29 NOTE — Anesthesia Procedure Notes (Signed)
Central Venous Catheter Insertion Performed by: Roderic Palau, anesthesiologist Start/End6/18/2018 6:45 AM, 04/29/2017 6:55 AM Patient location: Pre-op. Preanesthetic checklist: patient identified, IV checked, site marked, risks and benefits discussed, surgical consent, monitors and equipment checked, pre-op evaluation, timeout performed and anesthesia consent Position: Trendelenburg Lidocaine 1% used for infiltration and patient sedated Hand hygiene performed , maximum sterile barriers used  and Seldinger technique used Catheter size: 9 Fr Total catheter length 10. Central line and PA cath was placed.MAC introducer Swan type:thermodilution PA Cath depth:50 Procedure performed using ultrasound guided technique. Ultrasound Notes:anatomy identified, needle tip was noted to be adjacent to the nerve/plexus identified, no ultrasound evidence of intravascular and/or intraneural injection and image(s) printed for medical record Attempts: 1 Following insertion, line sutured and dressing applied. Post procedure assessment: blood return through all ports, free fluid flow and no air  Patient tolerated the procedure well with no immediate complications.

## 2017-04-29 NOTE — Anesthesia Postprocedure Evaluation (Signed)
Anesthesia Post Note  Patient: Jack Joseph  Procedure(s) Performed: Procedure(s) (LRB): REDO STERNOTOMY (N/A) REPLACEMENT ASCENDING AORTA/ WHEAT PROCEDURE, HYPOTHERMIC CIRCULATORY ARREST AND CARDIOPULMONARY BYPASS, AND USE OF HEMASHIELD PLATINUM WOVEN DOUBLE VELOUR VASCULAR GRAFT 32 MM X 50 CM (N/A) TRANSESOPHAGEAL ECHOCARDIOGRAM (TEE) (N/A)     Patient location during evaluation: SICU Anesthesia Type: General Level of consciousness: sedated Pain management: pain level controlled Vital Signs Assessment: post-procedure vital signs reviewed and stable Respiratory status: patient remains intubated per anesthesia plan Cardiovascular status: stable Anesthetic complications: no    Last Vitals:  Vitals:   04/29/17 1600 04/29/17 1615  BP:    Pulse: 85 (!) 39  Resp: 12 12  Temp: (!) 34.5 C (!) 34.6 C    Last Pain:  Vitals:   04/29/17 1510  TempSrc: Core (Comment)                 Torianne Laflam,W. EDMOND

## 2017-04-29 NOTE — Brief Op Note (Addendum)
04/29/2017  12:30 PM  PATIENT:  Jack Joseph  71 y.o. male  PRE-OPERATIVE DIAGNOSIS:  Dilated Ascending aorta   POST-OPERATIVE DIAGNOSIS:  Dilated Ascending aorta   PROCEDURE:  Procedure(s):  REDO STERNOTOMY (N/A)  REPLACEMENT OF ASCENDING AORTIA using HEMASHIELD PLATINUM WOVEN DOUBLE VELOUR VASCULAR GRAFT 32 MM X 50 CM (N/A) TRANSESOPHAGEAL ECHOCARDIOGRAM (TEE) (N/A) With Hypothermic Circulatory Arrest  Wheat Procedure   SURGEON:  Surgeon(s) and Role:    * [name], MD - Primary  PHYSICIAN ASSISTANT: Lowella Dandy PA-C  ANESTHESIA:   general  EBL:  Total I/O In: 2100 [I.V.:2100] Out: 845 [Urine:845]  BLOOD ADMINISTERED: CELLSAVER  DRAINS: Mediastinal chest tubes x2   LOCAL MEDICATIONS USED:  NONE  SPECIMEN:  Source of Specimen:  Ascending Aortic Aneurysm  DISPOSITION OF SPECIMEN:  N/A  COUNTS:  YES  TOURNIQUET:  * No tourniquets in log *  DICTATION: .Dragon Dictation  PLAN OF CARE: Admit to inpatient   PATIENT DISPOSITION:  ICU - intubated and hemodynamically stable.   Delay start of Pharmacological VTE agent (>24hrs) due to surgical blood loss or risk of bleeding: yes

## 2017-04-29 NOTE — Progress Notes (Signed)
Rapid Wean Protocol begun. RT will continue to monitor.

## 2017-04-29 NOTE — Progress Notes (Signed)
Patient placed back on full support at this time.

## 2017-04-29 NOTE — Progress Notes (Signed)
  Echocardiogram Echocardiogram Transesophageal has been performed.  Janalyn Harder 04/29/2017, 9:23 AM

## 2017-04-29 NOTE — Progress Notes (Signed)
Patient ID: Jack Joseph, male   DOB: Oct 21, 1946, 71 y.o.   MRN: [id number] EVENING ROUNDS NOTE :     [address]       [name] [address]             [phone]                 Day of Surgery Procedure(s) (LRB): REDO STERNOTOMY (N/A) REPLACEMENT ASCENDING AORTA/ WHEAT PROCEDURE, HYPOTHERMIC CIRCULATORY ARREST AND CARDIOPULMONARY BYPASS, AND USE OF HEMASHIELD PLATINUM WOVEN DOUBLE VELOUR VASCULAR GRAFT 32 MM X 50 CM (N/A) TRANSESOPHAGEAL ECHOCARDIOGRAM (TEE) (N/A)  Total Length of Stay:  LOS: 0 days  BP 127/79   Pulse 92   Temp (!) 94.6 F (34.8 C)   Resp 12   Wt 173 lb (78.5 kg)   SpO2 100%   BMI 24.82 kg/m   .Intake/Output      06/17 0701 - 06/18 0700 06/18 0701 - 06/19 0700   I.V. (mL/kg)  3555.4 (45.3)   Blood  850   IV Piggyback  650   Total Intake(mL/kg)  5055.4 (64.4)   Urine (mL/kg/hr)  1555 (1.8)   Blood  1500 (1.7)   Chest Tube  210 (0.2)   Total Output   3265   Net   +1790.4          . sodium chloride 20 mL/hr at 04/29/17 1500  . [START ON 04/30/2017] sodium chloride    . sodium chloride 20 mL/hr at 04/29/17 1500  . albumin human    . cefUROXime (ZINACEF)  IV    . dexmedetomidine (PRECEDEX) IV infusion Stopped (04/29/17 1715)  . famotidine (PEPCID) IV Stopped (04/29/17 1545)  . insulin (NOVOLIN-R) infusion 2.3 Units/hr (04/29/17 1600)  . lactated ringers    . lactated ringers    . lactated ringers 20 mL/hr at 04/29/17 1500  . magnesium sulfate 4 g (04/29/17 1515)  . nitroGLYCERIN Stopped (04/29/17 1515)  . phenylephrine (NEO-SYNEPHRINE) Adult infusion    . potassium chloride 10 mEq (04/29/17 1743)  . vancomycin       Lab Results  Component Value Date   WBC 18.4 (H) 04/29/2017   HGB 11.0 (L) 04/29/2017   HCT 33.6 (L) 04/29/2017   PLT 132 (L) 04/29/2017   GLUCOSE 155 (H) 04/29/2017   CHOL 199 01/22/2017   TRIG 71 01/22/2017   HDL 64 01/22/2017   LDLCALC 121 (H) 01/22/2017   ALT 10 (L) 04/25/2017   AST 20 04/25/2017     NA 137 04/29/2017   K 4.1 04/29/2017   CL 99 (L) 04/29/2017   CREATININE 0.60 (L) 04/29/2017   BUN 13 04/29/2017   CO2 23 04/25/2017   TSH 5.390 (H) 01/22/2017   INR 1.54 04/29/2017   HGBA1C 5.7 (H) 04/25/2017   Total 300 ml chest tube output since surgery ci 1.9 No awake yet    [name] MD  Beeper [phone] Office [phone] 04/29/2017 5:58 PM

## 2017-04-30 ENCOUNTER — Encounter (HOSPITAL_COMMUNITY): Payer: Self-pay | Admitting: Cardiothoracic Surgery

## 2017-04-30 ENCOUNTER — Inpatient Hospital Stay (HOSPITAL_COMMUNITY): Payer: Medicare Other

## 2017-04-30 LAB — CBC
HCT: 25 % — ABNORMAL LOW (ref 39.0–52.0)
HCT: 24.8 % — ABNORMAL LOW (ref 39.0–52.0)
HEMATOCRIT: 24.7 % — AB (ref 39.0–52.0)
Hemoglobin: 8.3 g/dL — ABNORMAL LOW (ref 13.0–17.0)
Hemoglobin: 8.3 g/dL — ABNORMAL LOW (ref 13.0–17.0)
Hemoglobin: 8.3 g/dL — ABNORMAL LOW (ref 13.0–17.0)
MCH: 30.7 pg (ref 26.0–34.0)
MCH: 30.7 pg (ref 26.0–34.0)
MCH: 31 pg (ref 26.0–34.0)
MCHC: 33.2 g/dL (ref 30.0–36.0)
MCHC: 33.5 g/dL (ref 30.0–36.0)
MCHC: 33.6 g/dL (ref 30.0–36.0)
MCV: 92.6 fL (ref 78.0–100.0)
MCV: 91.9 fL (ref 78.0–100.0)
MCV: 92.2 fL (ref 78.0–100.0)
Platelets: 113 10*3/uL — ABNORMAL LOW (ref 150–400)
Platelets: 115 10*3/uL — ABNORMAL LOW (ref 150–400)
Platelets: 102 10*3/uL — ABNORMAL LOW (ref 150–400)
RBC: 2.7 MIL/uL — ABNORMAL LOW (ref 4.22–5.81)
RBC: 2.7 MIL/uL — ABNORMAL LOW (ref 4.22–5.81)
RBC: 2.68 MIL/uL — ABNORMAL LOW (ref 4.22–5.81)
RDW: 14.2 % (ref 11.5–15.5)
RDW: 14.2 % (ref 11.5–15.5)
RDW: 14.5 % (ref 11.5–15.5)
WBC: 13.1 10*3/uL — ABNORMAL HIGH (ref 4.0–10.5)
WBC: 12.8 10*3/uL — ABNORMAL HIGH (ref 4.0–10.5)
WBC: 12 10*3/uL — ABNORMAL HIGH (ref 4.0–10.5)

## 2017-04-30 LAB — POCT I-STAT 3, ART BLOOD GAS (G3+)
Acid-base deficit: 3 mmol/L — ABNORMAL HIGH (ref 0.0–2.0)
Acid-base deficit: 3 mmol/L — ABNORMAL HIGH (ref 0.0–2.0)
Bicarbonate: 22.7 mmol/L (ref 20.0–28.0)
Bicarbonate: 23 mmol/L (ref 20.0–28.0)
Bicarbonate: 25.6 mmol/L (ref 20.0–28.0)
O2 Saturation: 95 %
O2 Saturation: 95 %
O2 Saturation: 99 %
Patient temperature: 34.1
Patient temperature: 36.7
Patient temperature: 37.1
TCO2: 24 mmol/L (ref 0–100)
TCO2: 24 mmol/L (ref 0–100)
TCO2: 27 mmol/L (ref 0–100)
pCO2 arterial: 42.1 mmHg (ref 32.0–48.0)
pCO2 arterial: 44.7 mmHg (ref 32.0–48.0)
pCO2 arterial: 46.4 mmHg (ref 32.0–48.0)
pH, Arterial: 7.303 — ABNORMAL LOW (ref 7.350–7.450)
pH, Arterial: 7.313 — ABNORMAL LOW (ref 7.350–7.450)
pH, Arterial: 7.379 (ref 7.350–7.450)
pO2, Arterial: 120 mmHg — ABNORMAL HIGH (ref 83.0–108.0)
pO2, Arterial: 83 mmHg (ref 83.0–108.0)
pO2, Arterial: 87 mmHg (ref 83.0–108.0)

## 2017-04-30 LAB — POCT I-STAT, CHEM 8
BUN: 13 mg/dL (ref 6–20)
BUN: 11 mg/dL (ref 6–20)
Calcium, Ion: 1.35 mmol/L (ref 1.15–1.40)
Calcium, Ion: 1.26 mmol/L (ref 1.15–1.40)
Chloride: 102 mmol/L (ref 101–111)
Chloride: 100 mmol/L — ABNORMAL LOW (ref 101–111)
Creatinine, Ser: 0.9 mg/dL (ref 0.61–1.24)
Creatinine, Ser: 0.9 mg/dL (ref 0.61–1.24)
Glucose, Bld: 122 mg/dL — ABNORMAL HIGH (ref 65–99)
Glucose, Bld: 122 mg/dL — ABNORMAL HIGH (ref 65–99)
HCT: 22 % — ABNORMAL LOW (ref 39.0–52.0)
HCT: 24 % — ABNORMAL LOW (ref 39.0–52.0)
Hemoglobin: 7.5 g/dL — ABNORMAL LOW (ref 13.0–17.0)
Hemoglobin: 8.2 g/dL — ABNORMAL LOW (ref 13.0–17.0)
Potassium: 4.3 mmol/L (ref 3.5–5.1)
Potassium: 4.2 mmol/L (ref 3.5–5.1)
Sodium: 138 mmol/L (ref 135–145)
Sodium: 136 mmol/L (ref 135–145)
TCO2: 25 mmol/L (ref 0–100)
TCO2: 27 mmol/L (ref 0–100)

## 2017-04-30 LAB — BASIC METABOLIC PANEL
Anion gap: 5 (ref 5–15)
BUN: 12 mg/dL (ref 6–20)
CALCIUM: 8.4 mg/dL — AB (ref 8.9–10.3)
CO2: 24 mmol/L (ref 22–32)
Chloride: 106 mmol/L (ref 101–111)
Creatinine, Ser: 1.05 mg/dL (ref 0.61–1.24)
GFR calc non Af Amer: >60 mL/min (ref >60)
Glucose, Bld: 143 mg/dL — ABNORMAL HIGH (ref 65–99)
Potassium: 4.3 mmol/L (ref 3.5–5.1)
SODIUM: 135 mmol/L (ref 135–145)

## 2017-04-30 LAB — POCT I-STAT 4, (NA,K, GLUC, HGB,HCT)
Glucose, Bld: 145 mg/dL — ABNORMAL HIGH (ref 65–99)
HCT: 30 % — ABNORMAL LOW (ref 39.0–52.0)
Hemoglobin: 10.2 g/dL — ABNORMAL LOW (ref 13.0–17.0)
Potassium: 3.5 mmol/L (ref 3.5–5.1)
Sodium: 141 mmol/L (ref 135–145)

## 2017-04-30 LAB — GLUCOSE, CAPILLARY
Glucose-Capillary: 100 mg/dL — ABNORMAL HIGH (ref 65–99)
Glucose-Capillary: 109 mg/dL — ABNORMAL HIGH (ref 65–99)
Glucose-Capillary: 123 mg/dL — ABNORMAL HIGH (ref 65–99)
Glucose-Capillary: 125 mg/dL — ABNORMAL HIGH (ref 65–99)
Glucose-Capillary: 127 mg/dL — ABNORMAL HIGH (ref 65–99)
Glucose-Capillary: 140 mg/dL — ABNORMAL HIGH (ref 65–99)
Glucose-Capillary: 146 mg/dL — ABNORMAL HIGH (ref 65–99)
Glucose-Capillary: 78 mg/dL (ref 65–99)

## 2017-04-30 LAB — MAGNESIUM
MAGNESIUM: 2.1 mg/dL (ref 1.7–2.4)
MAGNESIUM: 1.9 mg/dL (ref 1.7–2.4)

## 2017-04-30 LAB — CREATININE, SERUM
Creatinine, Ser: 1.05 mg/dL (ref 0.61–1.24)
GFR calc Af Amer: >60 mL/min (ref >60)

## 2017-04-30 MED ORDER — FUROSEMIDE 10 MG/ML IJ SOLN
40.0000 mg | Freq: Once | INTRAMUSCULAR | Status: AC
  Administered 2017-04-30: 40 mg via INTRAVENOUS
  Filled 2017-04-30: qty 4

## 2017-04-30 MED ORDER — NOREPINEPHRINE BITARTRATE 1 MG/ML IV SOLN
0.0000 ug/min | INTRAVENOUS | Status: DC
  Administered 2017-04-29: 12 ug/min via INTRAVENOUS
  Administered 2017-04-30: 5 ug/min via INTRAVENOUS
  Administered 2017-04-30: 11 ug/min via INTRAVENOUS
  Administered 2017-05-01: 5 ug/min via INTRAVENOUS
  Administered 2017-05-01 (×2): 10 ug/min via INTRAVENOUS
  Administered 2017-05-01: 3 ug/min via INTRAVENOUS
  Administered 2017-05-02: 7 ug/min via INTRAVENOUS
  Administered 2017-05-02: 6 ug/min via INTRAVENOUS
  Administered 2017-05-03: 8 ug/min via INTRAVENOUS
  Administered 2017-05-04: 14 ug/min via INTRAVENOUS
  Administered 2017-05-04: 12 ug/min via INTRAVENOUS
  Filled 2017-04-30 (×12): qty 4

## 2017-04-30 MED ORDER — DOPAMINE-DEXTROSE 3.2-5 MG/ML-% IV SOLN
0.0000 ug/kg/min | INTRAVENOUS | Status: DC
  Administered 2017-05-01 – 2017-05-06 (×3): 3 ug/kg/min via INTRAVENOUS
  Filled 2017-04-30 (×3): qty 250

## 2017-04-30 MED ORDER — WARFARIN - PHYSICIAN DOSING INPATIENT
Freq: Every day | Status: DC
  Administered 2017-05-06 – 2017-05-08 (×3)

## 2017-04-30 MED ORDER — LEVALBUTEROL HCL 0.63 MG/3ML IN NEBU
0.6300 mg | INHALATION_SOLUTION | Freq: Four times a day (QID) | RESPIRATORY_TRACT | Status: DC
  Administered 2017-04-30 – 2017-05-05 (×21): 0.63 mg via RESPIRATORY_TRACT
  Filled 2017-04-30 (×21): qty 3

## 2017-04-30 MED ORDER — ASPIRIN EC 81 MG PO TBEC
81.0000 mg | DELAYED_RELEASE_TABLET | Freq: Every day | ORAL | Status: DC
  Administered 2017-05-08: 81 mg via ORAL

## 2017-04-30 MED ORDER — INSULIN ASPART 100 UNIT/ML ~~LOC~~ SOLN
0.0000 [IU] | SUBCUTANEOUS | Status: DC
  Administered 2017-04-30 (×3): 2 [IU] via SUBCUTANEOUS
  Administered 2017-05-01: 4 [IU] via SUBCUTANEOUS
  Administered 2017-05-01 – 2017-05-06 (×15): 2 [IU] via SUBCUTANEOUS
  Administered 2017-05-07: 4 [IU] via SUBCUTANEOUS
  Administered 2017-05-07 – 2017-05-09 (×10): 2 [IU] via SUBCUTANEOUS
  Administered 2017-05-09: 4 [IU] via SUBCUTANEOUS
  Administered 2017-05-09 – 2017-05-14 (×20): 2 [IU] via SUBCUTANEOUS

## 2017-04-30 MED ORDER — INSULIN ASPART 100 UNIT/ML ~~LOC~~ SOLN: 0.0000 [IU] | SUBCUTANEOUS | Status: DC

## 2017-04-30 MED ORDER — ENOXAPARIN SODIUM 30 MG/0.3ML ~~LOC~~ SOLN
30.0000 mg | SUBCUTANEOUS | Status: DC
  Administered 2017-04-30 – 2017-05-03 (×4): 30 mg via SUBCUTANEOUS
  Filled 2017-04-30 (×4): qty 0.3

## 2017-04-30 MED ORDER — ASPIRIN 81 MG PO CHEW
81.0000 mg | CHEWABLE_TABLET | Freq: Every day | ORAL | Status: DC
  Administered 2017-05-01 – 2017-05-12 (×11): 81 mg
  Filled 2017-04-30 (×12): qty 1

## 2017-04-30 MED ORDER — MILRINONE LACTATE IN DEXTROSE 20-5 MG/100ML-% IV SOLN
0.1500 ug/kg/min | INTRAVENOUS | Status: DC
  Administered 2017-04-30: 0.15 ug/kg/min via INTRAVENOUS
  Filled 2017-04-30: qty 100

## 2017-04-30 MED ORDER — WARFARIN SODIUM 2.5 MG PO TABS
2.5000 mg | ORAL_TABLET | Freq: Once | ORAL | Status: AC
  Administered 2017-04-30: 2.5 mg via ORAL
  Filled 2017-04-30: qty 1

## 2017-04-30 MED ORDER — DOPAMINE-DEXTROSE 3.2-5 MG/ML-% IV SOLN: 0.0000 ug/kg/min | INTRAVENOUS | Status: DC

## 2017-04-30 MED FILL — Magnesium Sulfate Inj 50%: INTRAMUSCULAR | Qty: 10 | Status: AC

## 2017-04-30 MED FILL — Heparin Sodium (Porcine) Inj 1000 Unit/ML: INTRAMUSCULAR | Qty: 30 | Status: AC

## 2017-04-30 MED FILL — Potassium Chloride Inj 2 mEq/ML: INTRAVENOUS | Qty: 40 | Status: AC

## 2017-04-30 NOTE — Progress Notes (Addendum)
TCTS BRIEF SICU PROGRESS NOTE  1 Day Post-Op  S/P Procedure(s) (LRB): REDO STERNOTOMY (N/A) REPLACEMENT ASCENDING AORTA/ WHEAT PROCEDURE, HYPOTHERMIC CIRCULATORY ARREST AND CARDIOPULMONARY BYPASS, AND USE OF HEMASHIELD PLATINUM WOVEN DOUBLE VELOUR VASCULAR GRAFT 32 MM X 50 CM (N/A) TRANSESOPHAGEAL ECHOCARDIOGRAM (TEE) (N/A)   Stable day NSR w/ stable BP on low dose levophed and dopamine Breathing comfortably w/ O2 sats 100% on 2 L/min UOP excellent Labs okay  Plan: Continue current plan  Purcell Nails, MD 04/30/2017 7:25 PM

## 2017-04-30 NOTE — Op Note (Signed)
NAMEJACQUELINE, Jack Joseph NO.:  [id number]  MEDICAL RECORD NO.:  [id number]  LOCATION:  2H14C                        FACILITY:  [facility]  PHYSICIAN:  Sheliah Plane, MD    DATE OF BIRTH:  [birth year]  DATE OF PROCEDURE:  04/29/2017 DATE OF DISCHARGE:                              OPERATIVE REPORT   PREOPERATIVE DIAGNOSIS:  Status post mechanical aortic valve replacement in 1983, now with 6.3 to 6.5 cm ascending aortic dilatation with left ventricular dysfunction.  POSTOPERATIVE DIAGNOSIS:  Status post mechanical aortic valve replacement in 1983, now with 6.3 to 6.5 cm ascending aortic dilatation with left ventricular dysfunction.  SURGICAL PROCEDURES:  Replacement of ascending aorta/Wheat procedure with hypothermic circulatory arrest and cardiopulmonary bypass with a 32 mm Dacron Hemashield woven graft.  SURGEON:  Sheliah Plane, MD  FIRST ASSISTANT:  Lowella Dandy, PA.  BRIEF HISTORY:  The patient is a 71 year old male who, in 1983, had severe aortic insufficiency and underwent an aortic valve replacement with a Bjork-Shiley 27 mm valve.  At that time, he was noted to have significant LV dysfunction.  Since that time, the patient has done well and continued without significant complications of his Coumadin.  He now was referred to Cardiac Surgery because followup echocardiograms had demonstrated, for several years, a dilated ascending aorta.  Recently, a CT scan of the chest was performed that confirmed the mid ascending aorta at approximately 6.3 cm.  In addition, the patient had a left hilar mass.  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  The patient has marginal pulmonary function studies with an FEV1 of just over 1. Because of his dilated ascending aorta now in the range of significant risk of spontaneous dissection, especially in the setting of an original bicuspid aortic valve,  ascending aortic replacement was recommended to the patient.  Prior to surgery, had an extensive discussion about mechanical versus tissue valve.  We did confirm that the patient's [facility] valve had not been recalled.  The patient had no contraindication to continue with a mechanical valve.  Risks and options of surgery were discussed with the patient in detail and he agreed and signed informed consent.  DESCRIPTION OF PROCEDURE:  With Swan-Ganz and arterial line monitors in place, the patient underwent general endotracheal anesthesia without incident.  Dr. Sampson Goon placed a TEE probe.  Chest was prepped with Betadine and draped in usual sterile manner.  Appropriate time-out was performed.  I then proceeded with redo median sternotomy with the sagittal saw.  The underlying lung was dissected off the back of the sternum.  This gave [place] exposure to the mediastinum with careful dissection.  Because of his redo status, we were able to dissect out the right atrium and ascending aorta and part of the right ventricle.  The findings at the time of surgery were consistent with the CT scan.  TEE showed good function of his mechanical valve without any perivalvular leak.  Ejection fraction was 35% to 40%.  At the level of the innominate artery, the aorta was approximately 32 mm in diameter, but this left [place] not sufficient  room for a cross clamp.  We then elected to place the patient on cardiopulmonary bypass, cannulating the ascending aorta and a portion of the aorta that was to be resected and with the dual venous cannula, retrograde cardioplegic catheter was placed.  Retrograde cerebral perfusion catheter was placed in the superior vena cava after the patient was systemically heparinized.  We then placed the patient on cardiopulmonary bypass, 2.4 L/min/m2 and his body temperature was cooled to 18 degrees.  The head cooled and with this of 0 and at 18 degrees, we placed the patient in deep  head-down position and commenced circulatory arrest.  500 mL of cold blood potassium cardioplegia was also administered retrograde.  The aorta was then divided.  The distal aortic anastomosis was then performed with a side arm 32 mm Hemashield graft. The anastomosis was reinforced with a Felt strip.  After the anastomosis was completed, cardiopulmonary bypass was reinstituted, allowing to de- air the heart and graft and flow reinstituted antegrade and the crossclamp was placed on the ascending aortic graft.  During this period of circulatory arrest which totaled 30 minutes, 22 minutes of continuous retrograde cerebral perfusion was performed.  With the distal anastomosis completed, we then turned our attention to the proximal anastomosis.  The aorta was dissected down to just above the coronary ostium.  At this level, the aorta was approximately 4 cm.  The mechanical valve was well seated.  The right and left coronary ostium were without disease.  We then elected to leave the patient's current valve in place.  There was no pannus ingrowth.  There was free movement of the leaflets.  With a Felt strip in place, we performed the proximal anastomosis at the sinotubular ridge with a running 3-0 Prolene. Intermittently, during this time, cold retrograde cardioplegia was administered.  With the anastomosis almost completed, the patient's body temperature had slowly been rewarmed.  The heart was allowed to passively fill.  He was given 150 mL of warm blood cardioplegia.  The ascending aorta was de-aired as we removed the cross-clamp and completed the proximal anastomoses.  A McGowan needle had been placed in the ascending aorta for further de-airing before the removal of the crossclamp.  We continued to rewarm the patient to 37 degrees.  He required electrical defibrillation and returned to initially heart block, but ultimately returned to a sinus rhythm in the 80s.  Atrial and ventricular pacing  wires were applied.  The right superior pulmonary vein vent was removed.  Retrograde cardioplegia was removed.  With the patient's body temperature rewarmed, he was then ventilated and weaned from cardiopulmonary bypass without difficulty.  TEE showed good function of his valve and unchanged LV function.  He was supported on low-dose dopamine and Milrinone.  After separation from bypass, he remained hemodynamically stable.  He was decannulated in usual fashion. His vascular stapler was used to staple across the side arm use for perfusion.  Pericardium was loosely reapproximated.  Two mediastinal tubes were left in place.  The sternum was closed with #6 stainless steel wire.  Fascia closed with interrupted 0 Vicryl, running 3-0 Vicryl in subcutaneous tissue, 4-0 subcuticular stitch in skin edges.  Dry dressings were applied.  Sponge and needle count was reported as correct at the completion of the procedure.  The patient tolerated the procedure without obvious complication.  Total pump time was 163 minutes, cross- clamp time 52 minutes.  Total circulatory arrest time was 30 minutes. Retrograde cerebral perfusion 22 minutes.  The patient  did not require any blood bank blood products during the operative procedure and tolerated the procedure without obvious complication.     Sheliah Plane, MD     EG/MEDQ  D:  04/30/2017  T:  04/30/2017  Job:  [id number]  cc:   Peter M. [place], M.D.

## 2017-04-30 NOTE — Progress Notes (Signed)
Patient ID: Jack Joseph, male   DOB: Mar 28, 1946, 71 y.o.   MRN: [id number] TCTS DAILY ICU PROGRESS NOTE                   [address]            Jack Joseph [address]          [phone]   1 Day Post-Op Procedure(s) (LRB): REDO STERNOTOMY (N/A) REPLACEMENT ASCENDING AORTA/ WHEAT PROCEDURE, HYPOTHERMIC CIRCULATORY ARREST AND CARDIOPULMONARY BYPASS, AND USE OF HEMASHIELD PLATINUM WOVEN DOUBLE VELOUR VASCULAR GRAFT 32 MM X 50 CM (N/A) TRANSESOPHAGEAL ECHOCARDIOGRAM (TEE) (N/A)  Total Length of Stay:  LOS: 1 day   Subjective: Extubate, awake and alert neuro inatct  Objective: Vital signs in last 24 hours: Temp:  [93.4 F (34.1 C)-99.1 F (37.3 C)] 99.1 F (37.3 C) (06/19 0700) Pulse Rate:  [39-111] 83 (06/19 0700) Cardiac Rhythm: Normal sinus rhythm (06/19 0000) Resp:  [10-23] 10 (06/19 0700) BP: (91-117)/(41-68) 100/51 (06/19 0700) SpO2:  [91 %-100 %] 100 % (06/19 0700) Arterial Line BP: (95-156)/(32-63) 120/32 (06/19 0700) FiO2 (%):  [40 %-50 %] 40 % (06/18 2227) Weight:  [158 lb 4.8 oz (71.8 kg)-163 lb 2.3 oz (74 kg)] 158 lb 4.8 oz (71.8 kg) (06/19 0600)  Filed Weights   04/29/17 0554 04/30/17 0331 04/30/17 0600  Weight: 173 lb (78.5 kg) 163 lb 2.3 oz (74 kg) 158 lb 4.8 oz (71.8 kg)    Weight change: -9 lb 13.8 oz (-4.472 kg)   Hemodynamic parameters for last 24 hours: PAP: (19-30)/(2-15) 20/7 CO:  [3.5 L/min-9.2 L/min] 6.9 L/min CI:  [1.8 L/min/m2-4.7 L/min/m2] 3.5 L/min/m2  Intake/Output from previous day: 06/18 0701 - 06/19 0700 In: 7371.9 [I.V.:4941.9; Blood:850; NG/GT:30; IV Piggyback:1550] Out: 4550 [Urine:2510; Blood:1500; Chest Tube:540]  Intake/Output this shift: No intake/output data recorded.  Current Meds: Scheduled Meds: . acetaminophen  1,000 mg Oral Q6H   Or  . acetaminophen (TYLENOL) oral liquid 160 mg/5 mL  1,000 mg Per Tube Q6H  . aspirin EC  325 mg Oral Daily   Or  . aspirin  324 mg Per Tube Daily  . bisacodyl  10 mg  Oral Daily   Or  . bisacodyl  10 mg Rectal Daily  . chlorhexidine  15 mL Mouth Rinse BID  . Chlorhexidine Gluconate Cloth  6 each Topical Daily  . docusate sodium  200 mg Oral Daily  . insulin aspart  0-24 Units Subcutaneous Q4H  . mouth rinse  15 mL Mouth Rinse q12n4p  . metoCLOPramide (REGLAN) injection  10 mg Intravenous Q6H  . metoprolol tartrate  12.5 mg Oral BID   Or  . metoprolol tartrate  12.5 mg Per Tube BID  . [START ON 05/01/2017] pantoprazole  40 mg Oral Daily  . sodium chloride flush  10-40 mL Intracatheter Q12H  . sodium chloride flush  3 mL Intravenous Q12H   Continuous Infusions: . sodium chloride 20 mL/hr at 04/30/17 0600  . sodium chloride 250 mL (04/30/17 0628)  . sodium chloride Stopped (04/29/17 2000)  . albumin human    . cefUROXime (ZINACEF)  IV Stopped (04/29/17 1855)  . dexmedetomidine (PRECEDEX) IV infusion Stopped (04/29/17 1715)  . DOPamine    . lactated ringers    . lactated ringers    . lactated ringers 20 mL/hr at 04/30/17 0600  . milrinone 0.15 mcg/kg/min (04/30/17 0600)  . nitroGLYCERIN Stopped (04/29/17 1515)  . norepinephrine (LEVOPHED) Adult infusion 9 mcg/min (04/30/17 0600)  .  phenylephrine (NEO-SYNEPHRINE) Adult infusion     PRN Meds:.sodium chloride, albumin human, lactated ringers, metoprolol tartrate, morphine injection, ondansetron (ZOFRAN) IV, oxyCODONE, sodium chloride flush, sodium chloride flush, traMADol  General appearance: alert, cooperative and no distress Neurologic: intact Heart: regular rate and rhythm, S1, S2 normal, no murmur, click, rub or gallop Lungs: rhonchi RML and RUL Abdomen: soft, non-tender; bowel sounds normal; no masses,  no organomegaly Extremities: extremities normal, atraumatic, no cyanosis or edema and Homans sign is negative, no sign of DVT Wound: sternum inatct  Lab Results: CBC: Recent Labs  04/29/17 2144 04/30/17 0351  WBC 13.1* 12.8*  HGB 8.3* 8.3*  HCT 25.0* 24.8*  PLT 113* 115*   BMET:    Recent Labs  04/29/17 2123 04/29/17 2144 04/30/17 0351  NA 138  --  135  K 4.3  --  4.3  CL 102  --  106  CO2  --   --  24  GLUCOSE 122*  --  143*  BUN 13  --  12  CREATININE 0.90 1.10 1.05  CALCIUM  --   --  8.4*    CMET: Lab Results  Component Value Date   WBC 12.8 (H) 04/30/2017   HGB 8.3 (L) 04/30/2017   HCT 24.8 (L) 04/30/2017   PLT 115 (L) 04/30/2017   GLUCOSE 143 (H) 04/30/2017   CHOL 199 01/22/2017   TRIG 71 01/22/2017   HDL 64 01/22/2017   LDLCALC 121 (H) 01/22/2017   ALT 10 (L) 04/25/2017   AST 20 04/25/2017   NA 135 04/30/2017   K 4.3 04/30/2017   CL 106 04/30/2017   CREATININE 1.05 04/30/2017   BUN 12 04/30/2017   CO2 24 04/30/2017   TSH 5.390 (H) 01/22/2017   INR 1.54 04/29/2017   HGBA1C 5.7 (H) 04/25/2017      PT/INR:  Recent Labs  04/29/17 1502  LABPROT 18.6*  INR 1.54   Radiology: Dg Chest Port 1 View  Result Date: 04/30/2017 CLINICAL DATA:  Status post aortic valve replacement EXAM: PORTABLE CHEST 1 VIEW COMPARISON:  04/29/2017 FINDINGS: Cardiac shadow is at the upper limits of normal in size. Prior valve replacement is again identified. Swan-Ganz catheter is noted in the pulmonary outflow tract. The nasogastric catheter and endotracheal tube have been removed. Mediastinal drain remains in place. The lungs are well aerated with slight increase in the degree of left retrocardiac atelectasis. New right basilar atelectasis is noted as well. No pneumothorax is seen. IMPRESSION: Increasing bibasilar atelectasis left greater than right. Tubes and lines as described. Electronically Signed   By: Jack Joseph M.D.   On: 04/30/2017 07:20   Dg Chest Port 1 View  Result Date: 04/29/2017 CLINICAL DATA:  Redo sternotomy for replacement of the ascending aorta. EXAM: PORTABLE CHEST 1 VIEW COMPARISON:  Two-view chest x-ray 04/25/2017. FINDINGS: The patient is intubated. The endotracheal tube terminates 6.5 cm above the carina. The Swan-Ganz catheter entering  via right IJ sheath terminates in the main pulmonary outflow tract. Mediastinal and left pleural drains are in place. There is no pneumothorax. The redo sternotomy is noted with 7 intact wires. IMPRESSION: 1. Redo sternotomy without radiographic evidence for complication. 2. Support apparatus as above. Electronically Signed   By: Marin Roberts M.D.   On: 04/29/2017 15:48     Assessment/Plan: S/P Procedure(s) (LRB): REDO STERNOTOMY (N/A) REPLACEMENT ASCENDING AORTA/ WHEAT PROCEDURE, HYPOTHERMIC CIRCULATORY ARREST AND CARDIOPULMONARY BYPASS, AND USE OF HEMASHIELD PLATINUM WOVEN DOUBLE VELOUR VASCULAR GRAFT 32 MM X 50 CM (N/A)  TRANSESOPHAGEAL ECHOCARDIOGRAM (TEE) (N/A) Mobilize Diuresis d/c tubes/lines Continue foley due to strict I&O and patient in ICU Expected Acute  Blood - loss Anemia Start coumadin tomorrow , daily pt  Mechanical valve in place  CI good wean milrinone    [name] 04/30/2017 7:48 AM

## 2017-04-30 NOTE — Care Management Note (Signed)
Case Management Note Donn Pierini RN, BSN Unit 2W-Case Manager-- 2H coverage [phone]  Patient Details  Name: Jack Joseph MRN: [id number] Date of Birth: Nov 03, 1946  Subjective/Objective:  Pt admitted s/p REDO STERNOTOMY (N/A) REPLACEMENT ASCENDING AORTA/ WHEAT PROCEDURE on 04/29/17               Action/Plan: PTA pt lived at home with wife- CM to follow for d/c needs  Expected Discharge Date:                  Expected Discharge Plan:  Home/Self Care  In-House Referral:     Discharge planning Services  CM Consult  Post Acute Care Choice:    Choice offered to:     DME Arranged:    DME Agency:     HH Arranged:    HH Agency:     Status of Service:  In process, will continue to follow  If discussed at Long Length of Stay Meetings, dates discussed:    Discharge Disposition:   Additional Comments:  Darrold Span, RN 04/30/2017, 10:31 AM

## 2017-04-30 NOTE — Plan of Care (Signed)
Problem: Cardiac: Goal: Ability to maintain an adequate cardiac output will improve Outcome: Completed/Met Date Met: 04/30/17 ci<2 Goal: Will show no signs and symptoms of excessive bleeding Outcome: Progressing No bleeding  Problem: Respiratory: Goal: Levels of oxygenation will improve Outcome: Progressing Pt extubated Goal: Ability to tolerate decreased levels of ventilator support will improve Outcome: Completed/Met Date Met: 04/30/17 Pt extubated 6/18  Problem: Pain Management: Goal: Pain level will decrease Outcome: Progressing No co of pain

## 2017-05-01 ENCOUNTER — Inpatient Hospital Stay (HOSPITAL_COMMUNITY): Payer: Medicare Other

## 2017-05-01 DIAGNOSIS — I469 Cardiac arrest, cause unspecified: Secondary | ICD-10-CM

## 2017-05-01 DIAGNOSIS — J9600 Acute respiratory failure, unspecified whether with hypoxia or hypercapnia: Secondary | ICD-10-CM

## 2017-05-01 DIAGNOSIS — J95811 Postprocedural pneumothorax: Secondary | ICD-10-CM

## 2017-05-01 DIAGNOSIS — I360 Nonrheumatic tricuspid (valve) stenosis: Secondary | ICD-10-CM

## 2017-05-01 DIAGNOSIS — Z95828 Presence of other vascular implants and grafts: Secondary | ICD-10-CM

## 2017-05-01 LAB — POCT I-STAT 3, ART BLOOD GAS (G3+)
Acid-Base Excess: 4 mmol/L — ABNORMAL HIGH (ref 0.0–2.0)
Acid-base deficit: 3 mmol/L — ABNORMAL HIGH (ref 0.0–2.0)
Bicarbonate: 24.4 mmol/L (ref 20.0–28.0)
Bicarbonate: 27.7 mmol/L (ref 20.0–28.0)
O2 Saturation: 99 %
O2 Saturation: 100 %
Patient temperature: 99
Patient temperature: 97
TCO2: 26 mmol/L (ref 0–100)
TCO2: 29 mmol/L (ref 0–100)
pCO2 arterial: 53.7 mmHg — ABNORMAL HIGH (ref 32.0–48.0)
pCO2 arterial: 34.1 mmHg (ref 32.0–48.0)
pH, Arterial: 7.266 — ABNORMAL LOW (ref 7.350–7.450)
pH, Arterial: 7.514 — ABNORMAL HIGH (ref 7.350–7.450)
pO2, Arterial: 144 mmHg — ABNORMAL HIGH (ref 83.0–108.0)
pO2, Arterial: 211 mmHg — ABNORMAL HIGH (ref 83.0–108.0)

## 2017-05-01 LAB — ECHOCARDIOGRAM COMPLETE
AV Peak grad: 8 mmHg
AVPKVEL: 145 cm/s
Ao pk vel: 0.29 m/s
Area-P 1/2: 3.06 cm2
EERAT: 4.68
EWDT: 246 ms
Height: 70 in
LA vol index: 38 mL/m2
LA vol: 71.5 mL
LAVOLA4C: 62.7 mL
LV E/e' medial: 4.68
LV SIMPSON'S DISK: 25
LV TDI E'LATERAL: 11.2
LV TDI E'MEDIAL: 3.97
LV dias vol: 153 mL — AB (ref 62–150)
LV e' LATERAL: 11.2 cm/s
LV sys vol index: 61 mL/m2
LVDIAVOLIN: 81 mL/m2
LVEEAVG: 4.68
LVOT VTI: 8.94 cm
LVOT peak grad rest: 1 mmHg
LVOTPV: 42.1 cm/s
LVSYSVOL: 115 mL — AB (ref 21–61)
MV Dec: 246
MVPKAVEL: 67.5 m/s
MVPKEVEL: 52.4 m/s
MVSPHT: 72 ms
RV LATERAL S' VELOCITY: 3.89 cm/s
Stroke v: 38 ml
TAPSE: 16.5 mm
Weight: 2532.8 oz

## 2017-05-01 LAB — CK TOTAL AND CKMB (NOT AT ARMC)
CK, MB: 16.7 ng/mL — AB (ref 0.5–5.0)
RELATIVE INDEX: 3.5 — AB (ref 0.0–2.5)
Total CK: 474 U/L — ABNORMAL HIGH (ref 49–397)

## 2017-05-01 LAB — COMPREHENSIVE METABOLIC PANEL
ALBUMIN: 3.3 g/dL — AB (ref 3.5–5.0)
ALK PHOS: 30 U/L — AB (ref 38–126)
ALT: 18 U/L (ref 17–63)
AST: 42 U/L — AB (ref 15–41)
Anion gap: 11 (ref 5–15)
BILIRUBIN TOTAL: 0.9 mg/dL (ref 0.3–1.2)
BUN: 12 mg/dL (ref 6–20)
CALCIUM: 8 mg/dL — AB (ref 8.9–10.3)
CO2: 22 mmol/L (ref 22–32)
Chloride: 101 mmol/L (ref 101–111)
Creatinine, Ser: 1.31 mg/dL — ABNORMAL HIGH (ref 0.61–1.24)
GFR calc Af Amer: >60 mL/min (ref >60)
GFR calc non Af Amer: 53 mL/min — ABNORMAL LOW (ref >60)
GLUCOSE: 179 mg/dL — AB (ref 65–99)
Potassium: 3.7 mmol/L (ref 3.5–5.1)
Sodium: 134 mmol/L — ABNORMAL LOW (ref 135–145)
TOTAL PROTEIN: 5.1 g/dL — AB (ref 6.5–8.1)

## 2017-05-01 LAB — GLUCOSE, CAPILLARY
Glucose-Capillary: 113 mg/dL — ABNORMAL HIGH (ref 65–99)
Glucose-Capillary: 118 mg/dL — ABNORMAL HIGH (ref 65–99)
Glucose-Capillary: 120 mg/dL — ABNORMAL HIGH (ref 65–99)
Glucose-Capillary: 125 mg/dL — ABNORMAL HIGH (ref 65–99)
Glucose-Capillary: 160 mg/dL — ABNORMAL HIGH (ref 65–99)
Glucose-Capillary: 165 mg/dL — ABNORMAL HIGH (ref 65–99)

## 2017-05-01 LAB — COOXEMETRY PANEL
Carboxyhemoglobin: 0.8 % (ref 0.5–1.5)
Methemoglobin: 1.2 % (ref 0.0–1.5)
O2 Saturation: 67.6 %
Total hemoglobin: 8.5 g/dL — ABNORMAL LOW (ref 12.0–16.0)

## 2017-05-01 LAB — CBC
HCT: 25.6 % — ABNORMAL LOW (ref 39.0–52.0)
Hemoglobin: 8.5 g/dL — ABNORMAL LOW (ref 13.0–17.0)
MCH: 30.9 pg (ref 26.0–34.0)
MCHC: 33.2 g/dL (ref 30.0–36.0)
MCV: 93.1 fL (ref 78.0–100.0)
Platelets: 109 10*3/uL — ABNORMAL LOW (ref 150–400)
RBC: 2.75 MIL/uL — ABNORMAL LOW (ref 4.22–5.81)
RDW: 14.5 % (ref 11.5–15.5)
WBC: 16.1 10*3/uL — ABNORMAL HIGH (ref 4.0–10.5)

## 2017-05-01 LAB — MAGNESIUM
Magnesium: 1.8 mg/dL (ref 1.7–2.4)
Magnesium: 2.3 mg/dL (ref 1.7–2.4)

## 2017-05-01 LAB — TROPONIN I
Troponin I: 0.46 ng/mL (ref <0.03)
Troponin I: 0.44 ng/mL (ref <0.03)
Troponin I: 0.43 ng/mL (ref <0.03)

## 2017-05-01 LAB — PROTIME-INR
INR: 1.5
Prothrombin Time: 18.3 seconds — ABNORMAL HIGH (ref 11.4–15.2)

## 2017-05-01 LAB — LACTIC ACID, PLASMA: Lactic Acid, Venous: 1.2 mmol/L (ref 0.5–1.9)

## 2017-05-01 MED ORDER — MIDAZOLAM HCL 2 MG/2ML IJ SOLN
INTRAMUSCULAR | Status: AC
  Administered 2017-05-01: 2 mg
  Filled 2017-05-01: qty 2

## 2017-05-01 MED ORDER — SODIUM CHLORIDE 0.9% FLUSH
10.0000 mL | INTRAVENOUS | Status: DC | PRN
  Administered 2017-05-07 – 2017-05-14 (×2): 10 mL
  Filled 2017-05-01 (×2): qty 40

## 2017-05-01 MED ORDER — SODIUM CHLORIDE 0.9% FLUSH
10.0000 mL | Freq: Two times a day (BID) | INTRAVENOUS | Status: DC
  Administered 2017-05-01: 40 mL
  Administered 2017-05-04 – 2017-05-17 (×18): 10 mL
  Administered 2017-05-18: 30 mL
  Administered 2017-05-18 – 2017-05-19 (×2): 10 mL
  Administered 2017-05-20: 20 mL
  Administered 2017-05-20: 10 mL
  Administered 2017-05-21: 20 mL
  Administered 2017-05-21: 10 mL

## 2017-05-01 MED ORDER — MAGNESIUM SULFATE 4 GM/100ML IV SOLN
4.0000 g | Freq: Once | INTRAVENOUS | Status: AC
  Administered 2017-05-01: 4 g via INTRAVENOUS
  Filled 2017-05-01: qty 100

## 2017-05-01 MED ORDER — LIDOCAINE HCL (PF) 1 % IJ SOLN
INTRAMUSCULAR | Status: AC
  Administered 2017-05-01: 02:00:00 via INTRAVENOUS
  Filled 2017-05-01: qty 30

## 2017-05-01 MED ORDER — LIDOCAINE HCL (CARDIAC) 20 MG/ML IV SOLN
150.0000 mg | Freq: Once | INTRAVENOUS | Status: AC
  Administered 2017-05-01: 150 mg via INTRAVENOUS

## 2017-05-01 MED ORDER — POTASSIUM CHLORIDE 10 MEQ/50ML IV SOLN
10.0000 meq | INTRAVENOUS | Status: AC
  Administered 2017-05-01 (×3): 10 meq via INTRAVENOUS

## 2017-05-01 MED ORDER — FUROSEMIDE 10 MG/ML IJ SOLN
40.0000 mg | Freq: Once | INTRAMUSCULAR | Status: AC
  Administered 2017-05-01: 40 mg via INTRAVENOUS
  Filled 2017-05-01: qty 4

## 2017-05-01 MED ORDER — CHLORHEXIDINE GLUCONATE 0.12% ORAL RINSE (MEDLINE KIT)
15.0000 mL | Freq: Two times a day (BID) | OROMUCOSAL | Status: DC
  Administered 2017-05-02 – 2017-05-18 (×35): 15 mL via OROMUCOSAL

## 2017-05-01 MED ORDER — PANTOPRAZOLE SODIUM 40 MG IV SOLR
40.0000 mg | INTRAVENOUS | Status: DC
  Administered 2017-05-01 – 2017-05-06 (×6): 40 mg via INTRAVENOUS
  Filled 2017-05-01 (×6): qty 40

## 2017-05-01 MED ORDER — AMIODARONE HCL IN DEXTROSE 360-4.14 MG/200ML-% IV SOLN
30.0000 mg/h | INTRAVENOUS | Status: DC
  Administered 2017-05-01 – 2017-05-06 (×13): 30 mg/h via INTRAVENOUS
  Filled 2017-05-01 (×12): qty 200

## 2017-05-01 MED ORDER — LIDOCAINE HCL (PF) 1 % IJ SOLN
INTRAMUSCULAR | Status: AC
  Administered 2017-05-01: 02:00:00 via INTRAVENOUS
  Filled 2017-05-01: qty 5

## 2017-05-01 MED ORDER — AMIODARONE HCL IN DEXTROSE 360-4.14 MG/200ML-% IV SOLN
60.0000 mg/h | INTRAVENOUS | Status: AC
  Administered 2017-05-01: 60 mg/h via INTRAVENOUS
  Filled 2017-05-01 (×2): qty 200

## 2017-05-01 MED ORDER — ORAL CARE MOUTH RINSE
15.0000 mL | OROMUCOSAL | Status: DC
  Administered 2017-05-02 – 2017-05-14 (×119): 15 mL via OROMUCOSAL

## 2017-05-01 MED ORDER — MIDAZOLAM HCL 2 MG/2ML IJ SOLN
2.0000 mg | INTRAMUSCULAR | Status: DC | PRN
  Administered 2017-05-01 – 2017-05-02 (×3): 2 mg via INTRAVENOUS
  Administered 2017-05-04: 1 mg via INTRAVENOUS
  Filled 2017-05-01 (×4): qty 2

## 2017-05-01 MED ORDER — AMIODARONE IV BOLUS ONLY 150 MG/100ML
150.0000 mg | Freq: Once | INTRAVENOUS | Status: AC
  Administered 2017-05-01: 150 mg via INTRAVENOUS

## 2017-05-01 MED ORDER — POTASSIUM CHLORIDE 10 MEQ/50ML IV SOLN
INTRAVENOUS | Status: AC
  Filled 2017-05-01: qty 150

## 2017-05-01 MED ORDER — WARFARIN SODIUM 2.5 MG PO TABS
2.5000 mg | ORAL_TABLET | Freq: Once | ORAL | Status: AC
  Administered 2017-05-01: 2.5 mg via NASOGASTRIC
  Filled 2017-05-01: qty 1

## 2017-05-01 MED ORDER — SODIUM CHLORIDE 0.9 % IV SOLN
0.4000 ug/kg/h | INTRAVENOUS | Status: DC
  Administered 2017-05-01 (×3): 1.2 ug/kg/h via INTRAVENOUS
  Administered 2017-05-01: 0.4 ug/kg/h via INTRAVENOUS
  Administered 2017-05-01 – 2017-05-02 (×5): 1.2 ug/kg/h via INTRAVENOUS
  Filled 2017-05-01 (×10): qty 4

## 2017-05-01 NOTE — Progress Notes (Signed)
  Echocardiogram 2D Echocardiogram has been performed.  Ziggy Reveles T Dontravious Camille 05/01/2017, 7:18 AM

## 2017-05-01 NOTE — Consult Note (Signed)
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  Underwent ascending aortic replacement/wheat procedure with CABG.  Was progressing well post op and ambulating in hall.  However just after midnight 6/20 had VT arrest with CPR and shock x 3.  Intubated during ACLS, small R ptx noted post CPR and CT placed by CVTS.  PCCM consulted for vent management.   PAST MEDICAL HISTORY :  He  has a past medical history of AVD (aortic valve disease); Chronic anticoagulation; Dilated cardiomyopathy (HCC); Dysrhythmia; Hyperlipidemia; and LV dysfunction.  PAST SURGICAL HISTORY: He  has a past surgical history that includes Aortic valve replacement; doppler echocardiography (04/07/2002); Right/Left Heart Cath and Coronary Angiography (N/A, 03/05/2017); Coronary artery bypass graft; Bentall procedure (N/A, 04/29/2017); and TEE without cardioversion (N/A, 04/29/2017).  Allergies  Allergen Reactions  . No Known Allergies     No current facility-administered medications on file prior to encounter.    Current Outpatient Prescriptions on File Prior to Encounter  Medication Sig  . carvedilol (COREG) 3.125 MG tablet Take 2 tablets (6.25 mg total) by mouth 2 (two) times daily.  . colchicine 0.6 MG tablet Take 0.6 mg by mouth daily as needed.  Marland Kitchen losartan (COZAAR) 25 MG tablet Take 1 tablet (25 mg total) by mouth daily.  . methylcellulose (CITRUCEL) oral powder Take 1 packet by mouth 2 (two) times daily.   . sildenafil (REVATIO) 20 MG tablet TAKE 1-5 TABLETS BY MOUTH AS NEEDED FOR SEXUAL  ACTIVITY (Patient not taking: Reported on 04/25/2017)  . warfarin (COUMADIN) 5 MG tablet Take 1 tablet by mouth daily or as directed by [facility] (Patient not taking: Reported on 04/25/2017)    FAMILY HISTORY:  His indicated that his mother is alive. He indicated that his father is deceased. He indicated that his brother is deceased. He indicated that his maternal grandmother is deceased. He indicated that his maternal grandfather is deceased. He indicated that his paternal grandmother is deceased. He indicated that his paternal grandfather is deceased.    SOCIAL HISTORY: He  reports that he quit smoking about 16 years ago. He has never used smokeless tobacco. He reports that he does not drink alcohol or use drugs.  REVIEW OF SYSTEMS:   Unable.  As per HPI obtained from notes, MD, RN.   SUBJECTIVE:  Hypotensive this am.   VITAL SIGNS: BP (!) 94/53   Pulse 65   Temp 97 F (36.1 C) (Oral)   Resp 20   Ht 5\' 10"  (1.778 m)   Wt 72.6 kg (160 lb 0.9 oz)   SpO2 100%   BMI 22.97 kg/m   HEMODYNAMICS:    VENTILATOR SETTINGS: Vent Mode: PRVC FiO2 (%):  [80 %-100 %] 80 % Set Rate:  [16 bmp-20 bmp] 20 bmp Vt Set:  [550 mL] 550 mL PEEP:  [5 cmH20] 5 cmH20 Plateau Pressure:  [16 cmH20-20 cmH20] 20 cmH20  INTAKE / OUTPUT: I/O last 3 completed shifts: In: 2664.4 [I.V.:2334.4; NG/GT:30; IV Piggyback:300] Out: 5747 [Urine:5095; Chest Tube:652]  PHYSICAL EXAMINATION: General:  wdwn male, NAD on vent  Neuro:  Sedated on vent, follows commands per RN and wife HEENT:  Mm moist, ETT Cardiovascular:  s1s2 rrr 70's, sternotomy c/d  Lungs:  resps even non labored on vent, scattered rhonchi, R CT, no air leak, mild crepitus R chest  Abdomen:  Soft, hypoactive bs  Musculoskeletal:  Warm and dry, no edema  LABS:  BMET  Recent Labs Lab 04/25/17 1126  04/30/17 0351 04/30/17 1640 04/30/17 1657 05/01/17 0115  NA 133*  < > 135  --  136 134*  K 4.0  < > 4.3  --  4.2 3.7  CL 101  < >  106  --  100* 101  CO2 23  --  24  --   --  22  BUN 15  < > 12  --  11 12  CREATININE 1.12  < > 1.05 1.05 0.90 1.31*  GLUCOSE 104*  < > 143*  --  122* 179*  < > = values in this interval not displayed.  Electrolytes  Recent Labs Lab 04/25/17 1126  04/30/17 0351 04/30/17 1640 05/01/17 0115 05/01/17 0737  CALCIUM 9.3  --  8.4*  --  8.0*  --   MG  --   < > 2.1 1.9 1.8 2.3  < > = values in this interval not displayed.  CBC  Recent Labs Lab 04/30/17 0351 04/30/17 1640 04/30/17 1657 05/01/17 0115  WBC 12.8* 12.0*  --  16.1*  HGB 8.3* 8.3* 8.2* 8.5*  HCT 24.8* 24.7* 24.0* 25.6*  PLT 115* 102*  --  109*    Coag's  Recent Labs Lab 04/25/17 1126 04/29/17 1502 05/01/17 0737  APTT 33 34  --   INR 1.16 1.54 1.50    Sepsis Markers  Recent Labs Lab 05/01/17 0755  LATICACIDVEN 1.2    ABG  Recent Labs Lab 04/29/17 2250 04/30/17 0057 05/01/17 0121  PHART 7.313* 7.303* 7.266*  PCO2ART 44.7 46.4 53.7*  PO2ART 83.0 87.0 144.0*    Liver Enzymes  Recent Labs Lab 04/25/17 1126 05/01/17 0115  AST 20 42*  ALT 10* 18  ALKPHOS 51 30*  BILITOT 1.1 0.9  ALBUMIN 4.1 3.3*    Cardiac Enzymes  Recent Labs Lab 05/01/17 0737  TROPONINI 0.46*    Glucose  Recent Labs Lab 04/30/17 1306 04/30/17 1655 04/30/17 1920 04/30/17 2339 05/01/17 0333 05/01/17 0735  GLUCAP 140* 123* 127* 100* 165* 160*    Imaging Dg Chest Port 1 View  Result Date: 05/01/2017 CLINICAL DATA:  Pneumothorax post chest tube placement. Complication of chest tube. EXAM: PORTABLE CHEST 1 VIEW COMPARISON:  Earlier this day at 0103 hour FINDINGS: Post placement of right-sided chest tube, right apical pneumothorax is no longer seen. Large amount of subcutaneous emphysema about the right lateral chest wall. Right internal jugular sheath remains in place. Endotracheal and enteric tubes in place. Post median sternotomy with unchanged heart size and mediastinal contours from prior. Hazy lung base  opacities, right greater than left, similar to prior exam. IMPRESSION: 1. Post right-sided chest tube placement, right apical pneumothorax not definitively visualized. Large amount of subcutaneous emphysema about the right lateral chest wall. 2. Endotracheal and enteric tubes in place. Right internal jugular sheath remains in place. 3. Grossly stable bibasilar atelectasis. Electronically Signed   By: Rubye Oaks M.D.   On: 05/01/2017 03:10   Dg Chest Port 1 View  Result Date: 05/01/2017 CLINICAL DATA:  71 year old male status post open heart surgery and intubation. EXAM: PORTABLE CHEST 1 VIEW COMPARISON:  Chest radiograph dated 04/30/2017  FINDINGS: There has been interval placement of an endotracheal tube with tip approximately 4.5 cm above the carina. An enteric tube extends inferior to the diaphragm with tip beyond the inferior margin of the image. There has been interval removal of the previously seen Swan-Ganz catheter and inferiorly positioned mediastinal drain. The right IJ central venous line is noted with tip over central SVC. Bilateral mid to lower lung field hazy densities, likely atelectatic changes. There is no significant pleural effusion, however the costophrenic angles have been excluded from the image. There is a small right pneumothorax measuring up to 16 mm from the apical pleural surface. Stable cardiopericardial silhouette. Prominence of the bilateral hilar vasculature. Median sternotomy wires and cardiac valve replacement as seen on the prior radiograph. IMPRESSION: 1. Right-sided pneumothorax measuring 16 mm from the apical pleural surface, approximately less than 10%. 2. Interval placement of a right IJ central venous line, endotracheal tube, and enteric tube and removal of the Swan-Ganz catheter and mediastinal drain. 3. Bilateral mid to lower lung field hazy densities, likely atelectatic changes. 4. Grossly stable cardiopericardial silhouette size. These results were called by  telephone at the time of interpretation on 05/01/2017 at 1:25 am to Dr. Tressie Stalker , who verbally acknowledged these results. Electronically Signed   By: Elgie Collard M.D.   On: 05/01/2017 01:35     STUDIES:  2D echo 6/20>>> - Left ventricle: The cavity size was normal. Wall thickness was   normal. Systolic function was severely reduced. The estimated   ejection fraction was in the range of 10% to 15%. Doppler   parameters are consistent with abnormal left ventricular   relaxation (grade 1 diastolic dysfunction). - Aortic valve: A mechanical prosthesis was present. - Mitral valve: There was moderate regurgitation. - Left atrium: The atrium was mildly dilated. - Right ventricle: Systolic function was severely reduced. - Right atrium: The atrium was mildly dilated. - Tricuspid valve: There was mild-moderate regurgitation. - Pericardium, extracardiac: There was a left pleural effusion.  CULTURES:   ANTIBIOTICS:   SIGNIFICANT EVENTS: 6/20  LINES/TUBES: ETT 6/20>>> R chest tube 6/20>>>  DISCUSSION: 71yo male with VT arrest POD#2 thoracic aortic aneurysm repair.   ASSESSMENT / PLAN:  PULMONARY Acute respiratory failure - post VT arrest  Hx COPD  P:   Vent support - 8cc/kg  F/u CXR  Repeat ABG now  BD q6h   CARDIOVASCULAR VT arrest 6/20 - unclear etiology.  Was not cooled overnight r/t short duration CPR, good mental status post arrest.  Repair thoracic aortic aneurysm 6/18 LV dysfunction - previous EF 30-35%  Hx AVR  Acute on chronic CHF - EF 10-15% Shock - cardiogenic.   P:  Post op management per CVTS  Cards/EP to see  Continue amiodarone  Wean levo, dopamine as able to keep MAP >65 Trend troponin  Hold BB  ASA  RENAL AKI - mild  P:   F/u chem   GASTROINTESTINAL No active issue  P:   Npo for now  PPI   HEMATOLOGIC Chronic anticoagulation  P: lovenox  CVTS dosing coumadin   INFECTIOUS No active issue  P:   Monitor wbc, fever curve off  abx   ENDOCRINE Hyperglycemia   P:   SSI    NEUROLOGIC Sedation needs on vent - mental status appears intact post arrest.  Follows commands per RN and family.  P:   RASS goal:-1 Continue precedex, PRN morphine, PRN versed    FAMILY  - Updates:  Wife and son updated  at bedside 6/20.   - Inter-disciplinary family meet or Palliative Care meeting due by:  6/27    Dirk Dress, NP 05/01/2017  9:44 AM Pager: [phone] or [phone]  Attending Note:  I have examined patient, reviewed labs, studies and notes. I have discussed the case with Jasper Riling, and I agree with the data and plans as amended above. Also discussed the case with Dr. Tyrone Sage. Patient is 26 with a history of aortic valve replacement, severe COPD, thoracic aortic aneurysm repair on 6/18. He was improving and progressing postoperatively-6/19 p.m. Unfortunately he sustained a sudden VT arrest 6/19 and was DC cardioverted, intubated, underwent previous CPR. His mental status was deemed to be intact post arrest and hypothermia was deferred. He was noted to have acute renal insufficiency after the event, also noted to have acute worsening of his left ventricular EF on follow-up echocardiogram to 10-15%. On my evaluation today he is intubated, sedated. He was able to interact with family and RN earlier this am. No further evidence of dysrhythmia on monitor, currently on amiodarone. He remains on norepinephrine and dopamine. Heart is regular with a 2/6 systolic murmur. Abdomen benign. I will plan to continue his current sedation, leaving him intubated until he can be evaluated by EP. Will discuss with them if / when he might need EP workup or even AICD.  Independent critical care time is 40 minutes.   Levy Pupa, MD, PhD 05/01/2017, 2:13 PM [facility] [phone] or if no answer [phone]

## 2017-05-01 NOTE — Code Documentation (Signed)
CODE BLUE NOTE  Patient Name: Jack Joseph   MRN: [id number]   Date of Birth/ Sex: [birth year] , male      Admission Date: 04/29/2017  Attending Provider: [name], MD  Primary Diagnosis: <principal problem not specified>    Indication: Pt was in his usual state of health until this PM, when he was noted to be in [place]. Code blue was subsequently called. At the time of arrival on scene, ACLS protocol was underway.    Technical Description:  - CPR performance duration:  10 minutes  - Was defibrillation or cardioversion used? Yes   - Was external pacer placed? Yes  - Was patient intubated pre/post CPR? Yes    Medications Administered: Y = Yes; Blank = No Amiodarone    Atropine    Calcium    Epinephrine  Y  Lidocaine    Magnesium    Norepinephrine    Phenylephrine    Sodium bicarbonate    Vasopressin      Post CPR evaluation:  - Final Status - Was patient successfully resuscitated ? Yes - What is current rhythm? Sinus tach - What is current hemodynamic status? stable   Miscellaneous Information:  - Labs sent, including: none  - Primary team notified?  Yes  - Family Notified? No  - Additional notes/ transfer status: [facility] MD currently overseeing management        Leland Her, DO  05/01/2017, 12:54 AM

## 2017-05-01 NOTE — Procedures (Signed)
Intubation Procedure Note Jack Joseph [id number] [birth year]  Procedure: Intubation Indications: Respiratory insufficiency  Procedure Details Consent: Unable to obtain consent because of emergent medical necessity. Time Out: Verified patient identification, verified procedure, site/side was marked, verified correct patient position, special equipment/implants available, medications/allergies/relevent history reviewed, required imaging and test results available.  Performed  Maximum sterile technique was used including gloves and hand hygiene.  MAC and 3    Evaluation Hemodynamic Status: BP stable throughout; O2 sats: stable throughout Patient's Current Condition: stable Complications: No apparent complications Patient did tolerate procedure well. Chest X-ray ordered to verify placement.  CXR: tube position acceptable.  Patient intubated at 0100.   Jack Joseph 05/01/2017

## 2017-05-01 NOTE — Progress Notes (Addendum)
TCTS BRIEF SICU PROGRESS NOTE  2 Days Post-Op  S/P Procedure(s) (LRB): REDO STERNOTOMY (N/A) REPLACEMENT ASCENDING AORTA/ WHEAT PROCEDURE, HYPOTHERMIC CIRCULATORY ARREST AND CARDIOPULMONARY BYPASS, AND USE OF HEMASHIELD PLATINUM WOVEN DOUBLE VELOUR VASCULAR GRAFT 32 MM X 50 CM (N/A) TRANSESOPHAGEAL ECHOCARDIOGRAM (TEE) (N/A)   Patient w/ sudden onsent pulseless VTach which deteriorated into PEA requiring brief period of CPR and DCCV x3 Currently back in NSR/sinus tach w/ multifocal ectopy, stable BP Reintubated during CODE event, neurologically grossly intact ABG w/ mild respiratory acidosis, satisfactory oxygenation, CXR clear w/ small (<10%) right pneumothorax EKG w/ sinus rhythm, non-specific ST changes and no definite ST elevation, possible lateral wall ischemia Labs pending  Plan: Amiodarone.  Consider lidocaine if ectopy continues.  Consult cardiology for STAT ECHO.  Check labs and cycle cardiac enzymes  Purcell Nails, MD 05/01/2017 1:26 AM

## 2017-05-01 NOTE — Consult Note (Signed)
Advanced Heart Failure Team Consult Note  Primary Cardiologist:  [place]  Reason for Consultation: Acute on chronic systolic CHF, VT arrest s/p AVR replacement with Wheat procedure  HPI:    Jack Joseph is seen today for evaluation of acute on chronic systolic CHF and VT arrest at the request of Dr. Tyrone Sage.  Jack Joseph is a 71 y.o. 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 Pt has been followed by Dr Tyrone Sage, and presented to [facility] 04/29/17 for planned replacement of ascending aorta with wheat procedure for progressive dilation of his ascending aorta.  Pt post-op Redo sternotomy with replacement of ascending aorta/wheat procedure 04/29/17. No immediate complications. Was able to be extubated.   Pt initially recovered well after surgery, and was up walking around the evening of the 19th.  Later that evening, pt had sudden onset pulseless VTach which deteriorated into PEA requiring brief period of CPR and DCCV x 3.  Pt was re-intubated during code. Right chest tube placed by Dr. Cornelius Moras for R PTX.   Pt currently remains intubated and sedated s/p arrest over night. Intermittently awake on vent. Able to nod to questioning on my exam.  Denies pain. Daughter present in room.  NAD at this time.   Coox 67.6% on dopamine 3, norepi 4. Remains on amio at 30 mg/hr. Pressures remains soft in 80s. Creatinine 1.31. K 3.7, supp ordered.  Trop up to 0.46.  CXR this am with R chest tube with right apical PTX not definitively visualized. Large amount of subcutaneous emphysema around R lateral chest wall. Grossly stable bibasilar atelectasis.   Echo 05/01/17 LVEF 10-15% s/p VT arrest 04/30/17. GRade 1 DD, stable AVR, Moderate MR, mild LAE, RV severely reduced, mild RAE, mild/mod TR, left pleural  effusion.   Previous echo -> Echo 02/08/17 LVEF 25-30%, Grade 1 DD, Mechanical AVR, 6.3 cm Aortic aneurysm with severe ascending aorta dilation, Mild LAE, and peak PA pressure 38 mm Hg.   L/RHC 03/05/17 - Non-obstructive CAD -> Ost LAD to Prox LAD lesion 30% stenosed, Prox Cx to Mid Cx lesion, 30% stenosed, Mid RCA lesion, 20% stenosed RHC Procedural Findings: Hemodynamics (mmHg) RA mean 5 RV 29/0 (3) PA 28/9 (17) PCWP 10 AO 126/66 (90) Cardiac Output (Fick) 4.86 Cardiac Index (Fick) 2.69  ROS: Pt intubated and sedated, ROS obtained from chart and as stated in HPI and assessment and plan.   Home Medications Prior to Admission medications   Medication Sig Start Date End Date Taking? Authorizing Provider  carvedilol (COREG) 3.125 MG tablet Take 2 tablets (6.25 mg total) by mouth 2 (two) times daily. 03/05/17 02/28/18 Yes [place], Peter M, MD  colchicine 0.6 MG tablet Take 0.6 mg by mouth daily as needed.   Yes [provider]  enoxaparin (LOVENOX) 60 MG/0.6ML injection Inject 0.6 mLs (60 mg total) into the skin every 12 (twelve) hours. 04/25/17  Yes Jake Bathe, MD  losartan (COZAAR) 25 MG tablet Take 1 tablet (25 mg total) by mouth daily. 03/05/17 06/03/17 Yes [place], Peter M, MD  methylcellulose (CITRUCEL) oral powder Take 1 packet by mouth 2 (two) times daily.    Yes [provider]  sildenafil (REVATIO) 20 MG tablet TAKE 1-5 TABLETS BY MOUTH AS NEEDED FOR SEXUAL ACTIVITY Patient not taking: Reported  on 04/25/2017 10/19/16   [place], Peter M, MD  warfarin (COUMADIN) 5 MG tablet Take 1 tablet by mouth daily or as directed by [facility] Patient not taking: Reported on 04/25/2017 02/11/17   [place], Peter M, MD    Past Medical History: Past Medical History:  Diagnosis Date  . AVD (aortic valve disease)   . Chronic anticoagulation   . Dilated cardiomyopathy (HCC)    EF 30-35%  . Dysrhythmia   . Hyperlipidemia   . LV dysfunction     Past Surgical History: Past  Surgical History:  Procedure Laterality Date  . AORTIC VALVE REPLACEMENT    . BENTALL PROCEDURE N/A 04/29/2017   Procedure: REPLACEMENT ASCENDING AORTA/ WHEAT PROCEDURE, HYPOTHERMIC CIRCULATORY ARREST AND CARDIOPULMONARY BYPASS, AND USE OF HEMASHIELD PLATINUM WOVEN DOUBLE VELOUR VASCULAR GRAFT 32 MM X 50 CM;  Surgeon: [name], MD;  Location: [facility] OR;  Service: Open Heart Surgery;  Laterality: N/A;  . CORONARY ARTERY BYPASS GRAFT    . DOPPLER ECHOCARDIOGRAPHY  04/07/2002   EF 30-35%  . RIGHT/LEFT HEART CATH AND CORONARY ANGIOGRAPHY N/A 03/05/2017   Procedure: Right/Left Heart Cath and Coronary Angiography;  Surgeon: Peter M [place], MD;  Location: [facility] INVASIVE CV LAB;  Service: Cardiovascular;  Laterality: N/A;  . TEE WITHOUT CARDIOVERSION N/A 04/29/2017   Procedure: TRANSESOPHAGEAL ECHOCARDIOGRAM (TEE);  Surgeon: [name], MD;  Location: [facility] OR;  Service: Open Heart Surgery;  Laterality: N/A;    Family History: Family History  Problem Relation Age of Onset  . Heart disease Brother     Social History: Social History   Social History  . Marital status: Married    Spouse name: N/A  . Number of children: N/A  . Years of education: N/A   Social History Main Topics  . Smoking status: Former Smoker    Quit date: 11/12/2000  . Smokeless tobacco: Never Used  . Alcohol use No  . Drug use: No  . Sexual activity: Yes   Other Topics Concern  . None   Social History Narrative  . None    Allergies:  Allergies  Allergen Reactions  . No Known Allergies     Objective:    Vital Signs:   Temp:  [97 F (36.1 C)-99.1 F (37.3 C)] 97.2 F (36.2 C) (06/20 1115) Pulse Rate:  [50-144] 66 (06/20 1145) Resp:  [11-28] 18 (06/20 1145) BP: (72-169)/(35-73) 86/52 (06/20 1145) SpO2:  [95 %-100 %] 100 % (06/20 1145) Arterial Line BP: (87-155)/(32-63) 97/44 (06/20 1145) FiO2 (%):  [80 %-100 %] 80 % (06/20 1115) Weight:  [160 lb 0.9 oz (72.6 kg)] 160 lb 0.9 oz (72.6 kg) (06/20  0900) Last BM Date: 04/28/17  Weight change: Filed Weights   04/30/17 0331 04/30/17 0600 05/01/17 0900  Weight: 163 lb 2.3 oz (74 kg) 158 lb 4.8 oz (71.8 kg) 160 lb 0.9 oz (72.6 kg)    Intake/Output:   Intake/Output Summary (Last 24 hours) at 05/01/17 1208 Last data filed at 05/01/17 1100  Gross per 24 hour  Intake          1766.75 ml  Output             5405 ml  Net         -3638.25 ml     Physical Exam: General:  Intubated and sedated.  HEENT: +ETT Neck: Supple. JVP 7-8. Carotids 2+ bilat; no bruits. No lymphadenopathy or thyromegaly appreciated. Cor: PMI nondisplaced. Regular rate & rhythm. Mechanical S2.  Lungs: +  mechanical breathing sounds Abdomen: soft, nontender, nondistended. No hepatosplenomegaly. No bruits or masses. Good bowel sounds. Extremities: no cyanosis, clubbing, rash, edema Neuro: Intubated and sedated.  Telemetry: Personally reviewed, NSR 60-70s currently. Occasional PVCs  Labs: Basic Metabolic Panel:  Recent Labs Lab 04/25/17 1126  04/29/17 1336 04/29/17 1501 04/29/17 2123 04/29/17 2144 04/30/17 0351 04/30/17 1640 04/30/17 1657 05/01/17 0115 05/01/17 0737  NA 133*  < > 137 141 138  --  135  --  136 134*  --   K 4.0  < > 4.1 3.5 4.3  --  4.3  --  4.2 3.7  --   CL 101  < > 99*  --  102  --  106  --  100* 101  --   CO2 23  --   --   --   --   --  24  --   --  22  --   GLUCOSE 104*  < > 155* 145* 122*  --  143*  --  122* 179*  --   BUN 15  < > 13  --  13  --  12  --  11 12  --   CREATININE 1.12  < > 0.60*  --  0.90 1.10 1.05 1.05 0.90 1.31*  --   CALCIUM 9.3  --   --   --   --   --  8.4*  --   --  8.0*  --   MG  --   --   --   --   --  2.6* 2.1 1.9  --  1.8 2.3  < > = values in this interval not displayed.  Liver Function Tests:  Recent Labs Lab 04/25/17 1126 05/01/17 0115  AST 20 42*  ALT 10* 18  ALKPHOS 51 30*  BILITOT 1.1 0.9  PROT 7.1 5.1*  ALBUMIN 4.1 3.3*   No results for input(s): LIPASE, AMYLASE in the last 168 hours. No  results for input(s): AMMONIA in the last 168 hours.  CBC:  Recent Labs Lab 04/29/17 1502  04/29/17 2144 04/30/17 0351 04/30/17 1640 04/30/17 1657 05/01/17 0115  WBC 18.4*  --  13.1* 12.8* 12.0*  --  16.1*  HGB 11.0*  < > 8.3* 8.3* 8.3* 8.2* 8.5*  HCT 33.6*  < > 25.0* 24.8* 24.7* 24.0* 25.6*  MCV 93.6  --  92.6 91.9 92.2  --  93.1  PLT 132*  --  113* 115* 102*  --  109*  < > = values in this interval not displayed.  Cardiac Enzymes:  Recent Labs Lab 05/01/17 0344 05/01/17 0737  CKTOTAL 474*  --   CKMB 16.7*  --   TROPONINI  --  0.46*    BNP: BNP (last 3 results) No results for input(s): BNP in the last 8760 hours.  ProBNP (last 3 results) No results for input(s): PROBNP in the last 8760 hours.   CBG:  Recent Labs Lab 04/30/17 1920 04/30/17 2339 05/01/17 0333 05/01/17 0735 05/01/17 1129  GLUCAP 127* 100* 165* 160* 113*    Coagulation Studies:  Recent Labs  04/29/17 1502 05/01/17 0737  LABPROT 18.6* 18.3*  INR 1.54 1.50    Other results: EKG: Sinus tachycardia at 112 bpm with PVCs, 05/01/17 at 0200   Imaging: Dg Chest Port 1 View  Result Date: 05/01/2017 CLINICAL DATA:  Pneumothorax post chest tube placement. Complication of chest tube. EXAM: PORTABLE CHEST 1 VIEW COMPARISON:  Earlier this day at 0103 hour FINDINGS: Post placement of  right-sided chest tube, right apical pneumothorax is no longer seen. Large amount of subcutaneous emphysema about the right lateral chest wall. Right internal jugular sheath remains in place. Endotracheal and enteric tubes in place. Post median sternotomy with unchanged heart size and mediastinal contours from prior. Hazy lung base opacities, right greater than left, similar to prior exam. IMPRESSION: 1. Post right-sided chest tube placement, right apical pneumothorax not definitively visualized. Large amount of subcutaneous emphysema about the right lateral chest wall. 2. Endotracheal and enteric tubes in place. Right  internal jugular sheath remains in place. 3. Grossly stable bibasilar atelectasis. Electronically Signed   By: Rubye Oaks M.D.   On: 05/01/2017 03:10   Dg Chest Port 1 View  Result Date: 05/01/2017 CLINICAL DATA:  71 year old male status post open heart surgery and intubation. EXAM: PORTABLE CHEST 1 VIEW COMPARISON:  Chest radiograph dated 04/30/2017 FINDINGS: There has been interval placement of an endotracheal tube with tip approximately 4.5 cm above the carina. An enteric tube extends inferior to the diaphragm with tip beyond the inferior margin of the image. There has been interval removal of the previously seen Swan-Ganz catheter and inferiorly positioned mediastinal drain. The right IJ central venous line is noted with tip over central SVC. Bilateral mid to lower lung field hazy densities, likely atelectatic changes. There is no significant pleural effusion, however the costophrenic angles have been excluded from the image. There is a small right pneumothorax measuring up to 16 mm from the apical pleural surface. Stable cardiopericardial silhouette. Prominence of the bilateral hilar vasculature. Median sternotomy wires and cardiac valve replacement as seen on the prior radiograph. IMPRESSION: 1. Right-sided pneumothorax measuring 16 mm from the apical pleural surface, approximately less than 10%. 2. Interval placement of a right IJ central venous line, endotracheal tube, and enteric tube and removal of the Swan-Ganz catheter and mediastinal drain. 3. Bilateral mid to lower lung field hazy densities, likely atelectatic changes. 4. Grossly stable cardiopericardial silhouette size. These results were called by telephone at the time of interpretation on 05/01/2017 at 1:25 am to Dr. Tressie Stalker , who verbally acknowledged these results. Electronically Signed   By: Elgie Collard M.D.   On: 05/01/2017 01:35      Medications:     Current Medications: . acetaminophen  1,000 mg Oral Q6H   Or  .  acetaminophen (TYLENOL) oral liquid 160 mg/5 mL  1,000 mg Per Tube Q6H  . aspirin EC  81 mg Oral Daily   Or  . aspirin  81 mg Per Tube Daily  . bisacodyl  10 mg Oral Daily   Or  . bisacodyl  10 mg Rectal Daily  . chlorhexidine  15 mL Mouth Rinse BID  . Chlorhexidine Gluconate Cloth  6 each Topical Daily  . docusate sodium  200 mg Oral Daily  . enoxaparin (LOVENOX) injection  30 mg Subcutaneous Q24H  . insulin aspart  0-24 Units Subcutaneous Q4H  . levalbuterol  0.63 mg Nebulization Q6H  . mouth rinse  15 mL Mouth Rinse q12n4p  . pantoprazole (PROTONIX) IV  40 mg Intravenous Q24H  . sodium chloride flush  10-40 mL Intracatheter Q12H  . sodium chloride flush  3 mL Intravenous Q12H  . Warfarin - Physician Dosing Inpatient   Does not apply q1800     Infusions: . sodium chloride Stopped (04/30/17 0900)  . sodium chloride 250 mL (05/01/17 1000)  . sodium chloride Stopped (04/29/17 2000)  . amiodarone 30 mg/hr (05/01/17 1100)  . cefUROXime (ZINACEF)  IV Stopped (05/01/17 0429)  . dexmedetomidine (PRECEDEX) IV infusion 1.198 mcg/kg/hr (05/01/17 1100)  . DOPamine 2.989 mcg/kg/min (05/01/17 1100)  . lactated ringers    . lactated ringers    . lactated ringers 20 mL/hr at 05/01/17 1100  . norepinephrine (LEVOPHED) Adult infusion 4 mcg/min (05/01/17 1100)      Assessment/Plan   1. VT arrest - Continue amioadrone.  K 3.7 and Mg 2.3 after supp this am. Goal K > 4.0 and Mg > 2.0. Received K supplementation this am.   - Unclear etiology. Electrolytes with only mild imbalance this am.  - Echo without tamponade. Crash cart remains at bedside. - I/O negative 3.1 L yesterday with 4.2 L, so hypovolemia may have played a part. No further diuretics. Is receiving IVF.  2. Acute on chronic systolic CHF - EF now down to 16-10%. Suspect acute drop related to recent cardiac surgery, bypass, and arrest. Will need repeat echo at some point to look for recovery.  - Fluid status looks OK. Would not  diurese currently with soft pressures.  - Remains on dopamine 3 and norepi at 4. Coox 67%. Will follow.  - Continue current support.  - No BB with shock s/p arrest. - No room for ACE/ARB/ARNI/spiro.  3. S/p ascending aorta replacement with wheat procedure 04/29/17 - Initially did very well after surgery and was extubated. Now re-intubated and back on pressor support s/p VT arrest.  - TCTS following.  4. Acute hypoxic respiratory failure - Remains intubated and sedated  5. Non-obstructive CAD - Cath 03/05/17 states "non-obstructive CAD", though surgical history in chart shows "CABG". This was not present in pts chart until this admission.  - Continue medical therapy.  6. AKI - Mild. Likely in setting of arrest. Continue to follow closely.  7. R PTX - R apical pneumo with chest tube in place.  No tension pneumothorax noted. 8. 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  - No overt bleeding.   Seems to be doing well on current support. Has good urine output and extremities warm. Follow electrolytes, daily coox. Continue gentle IVF.   We appreciate the consult and will follow along with this patients care.   Length of Stay: 2  Graciella Freer, PA-C  05/01/2017, 12:08 PM  Advanced Heart Failure Team Pager [phone] (M-F; 7a - 4p)  Please contact [facility] for night-coverage after hours (4p -7a ) and weekends on [email or web address]  Agree with above.  71 y/o male with severe COPD, systolic HF due to NICM (EF 25-30% pre-op) and aortic valve disease s/p AVR 1983 admitted for re-do AVR (04/29/17) in setting of progressive Ao Root dilation. Did well immediately post-op and was able to be extubated and ambulated. Last night developed VT arrest with monomorphic VT. Required CPR and defibrillation as well as R chest tube for apical PTX.  Today is improved. Awake on vent following commands. Rhythm remains NSR on amio. BP soft but stable on low-dose  pressors. Co-ox 68%. Making good urine. Troponin flat at 0.4 arguing against ACS.  Echo reviewed personally and shows severe biventricular dysfunction. WITH EF 10% and severe RV dysfunction.  On exam intubated but awake and alert  FiO2 70% JVP mildly elevated.  Cor RRR mechanica s2 Lungs mild crackles. Decreased BS throughout Ab soft NT/ND Ext: Warm  Minimal edema.  Suspect primary VT arrest in the setting of very low EF. Currently hemodynamically stable on low-dose pressors. Rhythms stable on amio.   Will work  to slowly titrate pressors off over next day or two following co-ox closely. He has severe COPD but hopefully will be able to wean off vent quickly.   D/w EP team and will plan ICD prior to d/c.   CRITICAL CARE Performed by: Arvilla Meres  Total critical care time: 40 minutes  Critical care time was exclusive of separately billable procedures and treating other patients.  Critical care was necessary to treat or prevent imminent or life-threatening deterioration.  Critical care was time spent personally by me (independent of midlevel providers or residents) on the following activities: development of treatment plan with patient and/or surrogate as well as nursing, discussions with consultants, evaluation of patient's response to treatment, examination of patient, obtaining history from patient or surrogate, ordering and performing treatments and interventions, ordering and review of laboratory studies, ordering and review of radiographic studies, pulse oximetry and re-evaluation of patient's condition.  Arvilla Meres, MD  9:44 PM

## 2017-05-01 NOTE — Progress Notes (Signed)
Brief Cardiology Note  Received request for STAT TTE.  I have called operator who is contacting the on-call sonographer in order to get this done this morning.  Sonosite machines 2 heart do not have echo probes available.  Will work to the echo done ASAP.  Nursing staff aware.   Levonne Spiller, DO 3:45 AM

## 2017-05-01 NOTE — Progress Notes (Signed)
Patient ID: Jack Joseph, male   DOB: [birth year], 71 y.o.   MRN: [id number] [facility] DAILY ICU PROGRESS NOTE                   [address]            [name] [address]          [phone]   2 Days Post-Op Procedure(s) (LRB): REDO STERNOTOMY (N/A) REPLACEMENT ASCENDING AORTA/ WHEAT PROCEDURE, HYPOTHERMIC CIRCULATORY ARREST AND CARDIOPULMONARY BYPASS, AND USE OF HEMASHIELD PLATINUM WOVEN DOUBLE VELOUR VASCULAR GRAFT 32 MM X 50 CM (N/A) TRANSESOPHAGEAL ECHOCARDIOGRAM (TEE) (N/A)  Total Length of Stay:  LOS: 2 days   Subjective: Was walking around unit last night. Events later with arrest noted , now sedated on vent   Objective: Vital signs in last 24 hours: Temp:  [97.8 F (36.6 C)-99.3 F (37.4 C)] 98.2 F (36.8 C) (06/20 0350) Pulse Rate:  [50-144] 65 (06/20 0645) Cardiac Rhythm: Normal sinus rhythm (06/19 2000) Resp:  [11-28] 20 (06/20 0645) BP: (81-169)/(35-73) 102/56 (06/20 0645) SpO2:  [94 %-100 %] 100 % (06/20 0645) Arterial Line BP: (94-155)/(32-62) 134/62 (06/20 0445) FiO2 (%):  [90 %-100 %] 90 % (06/20 0356)  Filed Weights   04/29/17 0554 04/30/17 0331 04/30/17 0600  Weight: 173 lb (78.5 kg) 163 lb 2.3 oz (74 kg) 158 lb 4.8 oz (71.8 kg)    Weight change:    Hemodynamic parameters for last 24 hours: PAP: (22-24)/(10-13) 24/13  Intake/Output from previous day: 06/19 0701 - 06/20 0700 In: 1410.2 [I.V.:1110.2; IV Piggyback:300] Out: 4292 [Urine:4250; Chest Tube:42]  Intake/Output this shift: No intake/output data recorded.  Current Meds: Scheduled Meds: . acetaminophen  1,000 mg Oral Q6H   Or  . acetaminophen (TYLENOL) oral liquid 160 mg/5 mL  1,000 mg Per Tube Q6H  . aspirin EC  81 mg Oral Daily   Or  . aspirin  81 mg Per Tube Daily  . bisacodyl  10 mg Oral Daily   Or  . bisacodyl  10 mg Rectal Daily  . chlorhexidine  15 mL Mouth Rinse BID  . Chlorhexidine Gluconate Cloth  6 each Topical Daily  . docusate sodium  200 mg Oral  Daily  . enoxaparin (LOVENOX) injection  30 mg Subcutaneous Q24H  . insulin aspart  0-24 Units Subcutaneous Q4H  . levalbuterol  0.63 mg Nebulization Q6H  . mouth rinse  15 mL Mouth Rinse q12n4p  . metoprolol tartrate  12.5 mg Oral BID   Or  . metoprolol tartrate  12.5 mg Per Tube BID  . pantoprazole  40 mg Oral Daily  . sodium chloride flush  10-40 mL Intracatheter Q12H  . sodium chloride flush  3 mL Intravenous Q12H  . Warfarin - Physician Dosing Inpatient   Does not apply q1800   Continuous Infusions: . sodium chloride Stopped (04/30/17 0900)  . sodium chloride 250 mL (04/30/17 0628)  . sodium chloride Stopped (04/29/17 2000)  . amiodarone 60 mg/hr (05/01/17 0600)  . amiodarone    . cefUROXime (ZINACEF)  IV Stopped (05/01/17 0429)  . dexmedetomidine (PRECEDEX) IV infusion 1.2 mcg/kg/hr (05/01/17 0600)  . DOPamine 3 mcg/kg/min (05/01/17 0600)  . lactated ringers    . lactated ringers    . lactated ringers 20 mL/hr at 04/30/17 0600  . nitroGLYCERIN Stopped (04/29/17 1515)  . norepinephrine (LEVOPHED) Adult infusion 4 mcg/min (05/01/17 0600)  . phenylephrine (NEO-SYNEPHRINE) Adult infusion     PRN Meds:.sodium chloride, lactated ringers, metoprolol  tartrate, midazolam, morphine injection, ondansetron (ZOFRAN) IV, oxyCODONE, sodium chloride flush, sodium chloride flush, traMADol  General appearance: sedated on vent, nureses report he is responsive when sedation decreased  Neurologic: unknown Heart: regular rate and rhythm, S1, S2 normal, no murmur, click, rub or gallop Lungs: diminished breath sounds bilaterally Abdomen: soft, non-tender; bowel sounds normal; no masses,  no organomegaly Extremities: extremities normal, atraumatic, no cyanosis or edema and Homans sign is negative, no sign of DVT Wound: sternum stable , sub q air over right chest   Lab Results: CBC: Recent Labs  04/30/17 1640 04/30/17 1657 05/01/17 0115  WBC 12.0*  --  16.1*  HGB 8.3* 8.2* 8.5*  HCT 24.7*  24.0* 25.6*  PLT 102*  --  109*   BMET:  Recent Labs  04/30/17 0351  04/30/17 1657 05/01/17 0115  NA 135  --  136 134*  K 4.3  --  4.2 3.7  CL 106  --  100* 101  CO2 24  --   --  22  GLUCOSE 143*  --  122* 179*  BUN 12  --  11 12  CREATININE 1.05  < > 0.90 1.31*  CALCIUM 8.4*  --   --  8.0*  < > = values in this interval not displayed.  CMET: Lab Results  Component Value Date   WBC 16.1 (H) 05/01/2017   HGB 8.5 (L) 05/01/2017   HCT 25.6 (L) 05/01/2017   PLT 109 (L) 05/01/2017   GLUCOSE 179 (H) 05/01/2017   CHOL 199 01/22/2017   TRIG 71 01/22/2017   HDL 64 01/22/2017   LDLCALC 121 (H) 01/22/2017   ALT 18 05/01/2017   AST 42 (H) 05/01/2017   NA 134 (L) 05/01/2017   K 3.7 05/01/2017   CL 101 05/01/2017   CREATININE 1.31 (H) 05/01/2017   BUN 12 05/01/2017   CO2 22 05/01/2017   TSH 5.390 (H) 01/22/2017   INR 1.54 04/29/2017   HGBA1C 5.7 (H) 04/25/2017      PT/INR:  Recent Labs  04/29/17 1502  LABPROT 18.6*  INR 1.54   Radiology: Dg Chest Port 1 View  Result Date: 05/01/2017 CLINICAL DATA:  Pneumothorax post chest tube placement. Complication of chest tube. EXAM: PORTABLE CHEST 1 VIEW COMPARISON:  Earlier this day at 0103 hour FINDINGS: Post placement of right-sided chest tube, right apical pneumothorax is no longer seen. Large amount of subcutaneous emphysema about the right lateral chest wall. Right internal jugular sheath remains in place. Endotracheal and enteric tubes in place. Post median sternotomy with unchanged heart size and mediastinal contours from prior. Hazy lung base opacities, right greater than left, similar to prior exam. IMPRESSION: 1. Post right-sided chest tube placement, right apical pneumothorax not definitively visualized. Large amount of subcutaneous emphysema about the right lateral chest wall. 2. Endotracheal and enteric tubes in place. Right internal jugular sheath remains in place. 3. Grossly stable bibasilar atelectasis. Electronically  Signed   By: Rubye Oaks M.D.   On: 05/01/2017 03:10   Dg Chest Port 1 View  Result Date: 05/01/2017 CLINICAL DATA:  71 year old male status post open heart surgery and intubation. EXAM: PORTABLE CHEST 1 VIEW COMPARISON:  Chest radiograph dated 04/30/2017 FINDINGS: There has been interval placement of an endotracheal tube with tip approximately 4.5 cm above the carina. An enteric tube extends inferior to the diaphragm with tip beyond the inferior margin of the image. There has been interval removal of the previously seen Swan-Ganz catheter and inferiorly positioned mediastinal drain. The  right IJ central venous line is noted with tip over central SVC. Bilateral mid to lower lung field hazy densities, likely atelectatic changes. There is no significant pleural effusion, however the costophrenic angles have been excluded from the image. There is a small right pneumothorax measuring up to 16 mm from the apical pleural surface. Stable cardiopericardial silhouette. Prominence of the bilateral hilar vasculature. Median sternotomy wires and cardiac valve replacement as seen on the prior radiograph. IMPRESSION: 1. Right-sided pneumothorax measuring 16 mm from the apical pleural surface, approximately less than 10%. 2. Interval placement of a right IJ central venous line, endotracheal tube, and enteric tube and removal of the Swan-Ganz catheter and mediastinal drain. 3. Bilateral mid to lower lung field hazy densities, likely atelectatic changes. 4. Grossly stable cardiopericardial silhouette size. These results were called by telephone at the time of interpretation on 05/01/2017 at 1:25 am to Dr. Tressie Stalker , who verbally acknowledged these results. Electronically Signed   By: Elgie Collard M.D.   On: 05/01/2017 01:35     Assessment/Plan: S/P Procedure(s) (LRB): REDO STERNOTOMY (N/A) REPLACEMENT ASCENDING AORTA/ WHEAT PROCEDURE, HYPOTHERMIC CIRCULATORY ARREST AND CARDIOPULMONARY BYPASS, AND USE OF  HEMASHIELD PLATINUM WOVEN DOUBLE VELOUR VASCULAR GRAFT 32 MM X 50 CM (N/A) TRANSESOPHAGEAL ECHOCARDIOGRAM (TEE) (N/A) Pulses VT last  night now on Cordarone , - will have ep see  Sedated on vent , support for now  Wean vent as tolerated  Place  pic Has mechanical valve in place  Renal function stable   [name] 05/01/2017 7:27 AM

## 2017-05-01 NOTE — Op Note (Signed)
CARDIOTHORACIC SURGERY OPERATIVE NOTE  Date of Procedure:  05/01/2017  Preoperative Diagnosis: Right Pneumothorax  Postoperative Diagnosis: Same  Procedure:   Right chest tube placement  Surgeon:   Salvatore Decent. Cornelius Moras, MD  Anesthesia: 1% lidocaine local with intravenous sedation    DETAILS OF THE OPERATIVE PROCEDURE  With the patient intubated and sedated in the ICU and continuously monitored for rhythm, BP and oxygen saturation. The right chest was prepared and draped in a sterile manner. 1% lidocaine was utilized to anesthetize the skin and subcutaneous tissues. A small incision was made and a 28 French straight chest tube was placed through the incision into the pleural space. The tube was secured to the skin and connected to a closed suction collection device. The patient tolerated the procedure well. A portable CXR was ordered. There were no complications. The patient's wife and daughter were updated regarding the recent developments.  All questions answered.   Salvatore Decent. Cornelius Moras, MD 05/01/2017 2:18 AM

## 2017-05-01 NOTE — Progress Notes (Signed)
Peripherally Inserted Central Catheter/Midline Placement  The IV Nurse has discussed with the patient and/or persons authorized to consent for the patient, the purpose of this procedure and the potential benefits and risks involved with this procedure.  The benefits include less needle sticks, lab draws from the catheter, and the patient may be discharged home with the catheter. Risks include, but not limited to, infection, bleeding, blood clot (thrombus formation), and puncture of an artery; nerve damage and irregular heartbeat and possibility to perform a PICC exchange if needed/ordered by physician.  Alternatives to this procedure were also discussed.  Bard Power PICC patient education guide, fact sheet on infection prevention and patient information card has been provided to patient /or left at bedside.    PICC/Midline Placement Documentation     Consent obtained with wife at bedside   Timmothy Sours 05/01/2017, 5:43 PM

## 2017-05-01 NOTE — Progress Notes (Signed)
Patient found in pulseless VTach. CPR initiated at this time. Please see code sheet for details.

## 2017-05-01 NOTE — Consult Note (Signed)
ELECTROPHYSIOLOGY CONSULT NOTE    Patient ID: Mccoy Testa MRN: [id number], DOB/AGE: Jul 15, 1946 71 y.o.  Admit date: 04/29/2017 Date of Consult: 05/01/2017  Primary Physician: [place], Betty G, MD Primary Cardiologist: [place]  Reason for Consultation: VT arrest  HPI:  Jack Joseph is a 71 y.o. male is referred by Dr Lyn Henri for evaluation of VT arrest post replacement of ascending aorta and Wheat procedure.  Past medical history is significant for CAD s/p CABG, chronic systolic heart failure, s/p AVR 1983.  He has been followed closely by Dr [place] and found to have progressive dilation of ascending aorta.  He was referred to CT surgery who recommended ascending aortic replacement. He underwent surgery on 04/30/17 and did well initially post op. Last night, he developed hemodynamically unstable ventricular tachycardia and required DCCV x3.  He was reintubated at that time. EP has been asked to evaluate for treatment options.  He is currently intubated with pressor support.  Echo post arrest demonstrated EF of 10-15%, grade 1 diastolic dysfunction, moderate MR.   He is currently intubated and sedated and unable to provide further history or ROS  Past Medical History:  Diagnosis Date  . AVD (aortic valve disease)   . Chronic anticoagulation   . Dilated cardiomyopathy (HCC)    EF 30-35%  . Dysrhythmia   . Hyperlipidemia   . LV dysfunction      Surgical History:  Past Surgical History:  Procedure Laterality Date  . AORTIC VALVE REPLACEMENT    . BENTALL PROCEDURE N/A 04/29/2017   Procedure: REPLACEMENT ASCENDING AORTA/ WHEAT PROCEDURE, HYPOTHERMIC CIRCULATORY ARREST AND CARDIOPULMONARY BYPASS, AND USE OF HEMASHIELD PLATINUM WOVEN DOUBLE VELOUR VASCULAR GRAFT 32 MM X 50 CM;  Surgeon: [name], MD;  Location: [facility] OR;  Service: Open Heart Surgery;  Laterality: N/A;  . CORONARY ARTERY BYPASS GRAFT    . DOPPLER ECHOCARDIOGRAPHY  04/07/2002   EF 30-35%  . RIGHT/LEFT  HEART CATH AND CORONARY ANGIOGRAPHY N/A 03/05/2017   Procedure: Right/Left Heart Cath and Coronary Angiography;  Surgeon: Peter M [place], MD;  Location: [facility] INVASIVE CV LAB;  Service: Cardiovascular;  Laterality: N/A;  . TEE WITHOUT CARDIOVERSION N/A 04/29/2017   Procedure: TRANSESOPHAGEAL ECHOCARDIOGRAM (TEE);  Surgeon: [name], MD;  Location: [facility] OR;  Service: Open Heart Surgery;  Laterality: N/A;     Prescriptions Prior to Admission  Medication Sig Dispense Refill Last Dose  . carvedilol (COREG) 3.125 MG tablet Take 2 tablets (6.25 mg total) by mouth 2 (two) times daily. 180 tablet 3 04/29/2017 at 0445  . colchicine 0.6 MG tablet Take 0.6 mg by mouth daily as needed.   Past Week at Unknown time  . enoxaparin (LOVENOX) 60 MG/0.6ML injection Inject 0.6 mLs (60 mg total) into the skin every 12 (twelve) hours. 20 Syringe 0 04/28/2017 at Unknown time  . losartan (COZAAR) 25 MG tablet Take 1 tablet (25 mg total) by mouth daily. 30 tablet 11 Past Week at Unknown time  . methylcellulose (CITRUCEL) oral powder Take 1 packet by mouth 2 (two) times daily.    04/28/2017 at Unknown time  . sildenafil (REVATIO) 20 MG tablet TAKE 1-5 TABLETS BY MOUTH AS NEEDED FOR SEXUAL ACTIVITY (Patient not taking: Reported on 04/25/2017) 50 tablet 1 More than a month at Unknown time  . warfarin (COUMADIN) 5 MG tablet Take 1 tablet by mouth daily or as directed by [facility] (Patient not taking: Reported on 04/25/2017) 90 tablet 1 04/21/2017    Inpatient Medications: .  acetaminophen  1,000 mg Oral Q6H   Or  . acetaminophen (TYLENOL) oral liquid 160 mg/5 mL  1,000 mg Per Tube Q6H  . aspirin EC  81 mg Oral Daily   Or  . aspirin  81 mg Per Tube Daily  . bisacodyl  10 mg Oral Daily   Or  . bisacodyl  10 mg Rectal Daily  . chlorhexidine  15 mL Mouth Rinse BID  . Chlorhexidine Gluconate Cloth  6 each Topical Daily  . docusate sodium  200 mg Oral Daily  . enoxaparin (LOVENOX) injection  30 mg Subcutaneous Q24H  .  insulin aspart  0-24 Units Subcutaneous Q4H  . levalbuterol  0.63 mg Nebulization Q6H  . mouth rinse  15 mL Mouth Rinse q12n4p  . pantoprazole (PROTONIX) IV  40 mg Intravenous Q24H  . sodium chloride flush  10-40 mL Intracatheter Q12H  . sodium chloride flush  3 mL Intravenous Q12H  . Warfarin - Physician Dosing Inpatient   Does not apply q1800    Allergies:  Allergies  Allergen Reactions  . No Known Allergies     Social History   Social History  . Marital status: Married    Spouse name: N/A  . Number of children: N/A  . Years of education: N/A   Occupational History  . Not on file.   Social History Main Topics  . Smoking status: Former Smoker    Quit date: 11/12/2000  . Smokeless tobacco: Never Used  . Alcohol use No  . Drug use: No  . Sexual activity: Yes   Other Topics Concern  . Not on file   Social History Narrative  . No narrative on file     Family History  Problem Relation Age of Onset  . Heart disease Brother      Review of Systems: All other systems reviewed and are otherwise negative except as noted above.  Physical Exam: Vitals:   05/01/17 1230 05/01/17 1245 05/01/17 1300 05/01/17 1339  BP:   (!) 81/47   Pulse: 65 64 64   Resp: 20 20 18    Temp:      TempSrc:      SpO2: 100% 100% 100% 100%  Weight:      Height:        GEN- intubated and sedated but awake HEENT: normocephalic, atraumatic; sclera clear, conjunctiva pink; hearing intact; oropharynx clear; neck supple, +ETT Lungs- +vent Heart- Regular rate and rhythm, no murmurs, rubs or gallops, PMI not laterally displaced GI- soft, non-tender, non-distended, bowel sounds present, no hepatosplenomegaly Extremities- no clubbing, cyanosis, or edema  MS- no significant deformity or atrophy Skin- warm and dry, no rash or lesion Psych- euthymic mood, full affect Neuro- strength and sensation are intact  Labs:  Lab Results  Component Value Date   WBC 16.1 (H) 05/01/2017   HGB 8.5 (L)  05/01/2017   HCT 25.6 (L) 05/01/2017   MCV 93.1 05/01/2017   PLT 109 (L) 05/01/2017    Recent Labs Lab 05/01/17 0115  NA 134*  K 3.7  CL 101  CO2 22  BUN 12  CREATININE 1.31*  CALCIUM 8.0*  PROT 5.1*  BILITOT 0.9  ALKPHOS 30*  ALT 18  AST 42*  GLUCOSE 179*      Radiology/Studies:  Dg Chest Port 1 View Result Date: 05/01/2017 CLINICAL DATA:  Pneumothorax post chest tube placement. Complication of chest tube. EXAM: PORTABLE CHEST 1 VIEW COMPARISON:  Earlier this day at 0103 hour FINDINGS: Post placement of right-sided  chest tube, right apical pneumothorax is no longer seen. Large amount of subcutaneous emphysema about the right lateral chest wall. Right internal jugular sheath remains in place. Endotracheal and enteric tubes in place. Post median sternotomy with unchanged heart size and mediastinal contours from prior. Hazy lung base opacities, right greater than left, similar to prior exam. IMPRESSION: 1. Post right-sided chest tube placement, right apical pneumothorax not definitively visualized. Large amount of subcutaneous emphysema about the right lateral chest wall. 2. Endotracheal and enteric tubes in place. Right internal jugular sheath remains in place. 3. Grossly stable bibasilar atelectasis. Electronically Signed   By: Rubye Oaks M.D.   On: 05/01/2017 03:10   TELEMETRY: sinus rhythm, episode of MMVT requiring cardioversion at time of arrest last night  (personally reviewed)   Assessment/Plan: 1.  VT arrest Continue IV amiodarone for now Keep K >3.9, Mg >1.8 He has a longstanding cardiomyopathy and will likely require ICD prior to discharge. K 4.2 at time of arrest last night  No driving x6 months   2.  Acute on chronic systolic heart failure Appreciated AHF team's assistance  3.  S/p ascending aorta replacement 04/29/17 Per [facility]  Dr Ladona Ridgel to see later today   Signed, Gypsy Balsam, NP 05/01/2017 2:06 PM  EP Attending  Patient seen and examined.  Discussed with his wife. Agree with the findings as noted above. The patient is a pleasant 71 yo man with a h/o AVR, CAD, aortic root dilatation who underwent redo aortic root replacement 2 days ago. He was doing well but developed pulseless VT and was defibrillated and re-intubated. The patient remains intubated on exam with a RRR and clear lungs and 1+ edema. Abdomen is soft. Neuro cannot be assessed but he appears to be awake and alert despite the ET tube, nodding and responding to questions. ECG with NSR and Tele as above with VT as well.  A/P 1. VT after Aortic root replacement - He is now on amiodarone. I would anticipate insertion of an ICD prior to his DC home.  2. Aortic root replacement - he is otherwise doing well post op.  3. Chronic systolic heart failure - his EF is down after his VT arrest. Hopefully it will improve.   Leonia Reeves.D.

## 2017-05-02 ENCOUNTER — Inpatient Hospital Stay (HOSPITAL_COMMUNITY): Payer: Medicare Other

## 2017-05-02 ENCOUNTER — Encounter (HOSPITAL_COMMUNITY): Payer: Self-pay | Admitting: Cardiothoracic Surgery

## 2017-05-02 DIAGNOSIS — I472 Ventricular tachycardia, unspecified: Secondary | ICD-10-CM

## 2017-05-02 DIAGNOSIS — Z09 Encounter for follow-up examination after completed treatment for conditions other than malignant neoplasm: Secondary | ICD-10-CM

## 2017-05-02 DIAGNOSIS — I77819 Aortic ectasia, unspecified site: Secondary | ICD-10-CM

## 2017-05-02 DIAGNOSIS — I5023 Acute on chronic systolic (congestive) heart failure: Secondary | ICD-10-CM

## 2017-05-02 LAB — GLUCOSE, CAPILLARY
Glucose-Capillary: 116 mg/dL — ABNORMAL HIGH (ref 65–99)
Glucose-Capillary: 126 mg/dL — ABNORMAL HIGH (ref 65–99)
Glucose-Capillary: 106 mg/dL — ABNORMAL HIGH (ref 65–99)
Glucose-Capillary: 122 mg/dL — ABNORMAL HIGH (ref 65–99)
Glucose-Capillary: 96 mg/dL (ref 65–99)

## 2017-05-02 LAB — COMPREHENSIVE METABOLIC PANEL
ALT: 20 U/L (ref 17–63)
AST: 36 U/L (ref 15–41)
Albumin: 3 g/dL — ABNORMAL LOW (ref 3.5–5.0)
Alkaline Phosphatase: 37 U/L — ABNORMAL LOW (ref 38–126)
Anion gap: 9 (ref 5–15)
BUN: 11 mg/dL (ref 6–20)
CO2: 25 mmol/L (ref 22–32)
Calcium: 7.7 mg/dL — ABNORMAL LOW (ref 8.9–10.3)
Chloride: 97 mmol/L — ABNORMAL LOW (ref 101–111)
Creatinine, Ser: 0.94 mg/dL (ref 0.61–1.24)
GFR calc Af Amer: >60 mL/min (ref >60)
GFR calc non Af Amer: >60 mL/min (ref >60)
Glucose, Bld: 148 mg/dL — ABNORMAL HIGH (ref 65–99)
Potassium: 3.8 mmol/L (ref 3.5–5.1)
Sodium: 131 mmol/L — ABNORMAL LOW (ref 135–145)
Total Bilirubin: 0.7 mg/dL (ref 0.3–1.2)
Total Protein: 5 g/dL — ABNORMAL LOW (ref 6.5–8.1)

## 2017-05-02 LAB — CBC
HCT: 25.1 % — ABNORMAL LOW (ref 39.0–52.0)
Hemoglobin: 8.8 g/dL — ABNORMAL LOW (ref 13.0–17.0)
MCH: 31.3 pg (ref 26.0–34.0)
MCHC: 35.1 g/dL (ref 30.0–36.0)
MCV: 89.3 fL (ref 78.0–100.0)
Platelets: 115 10*3/uL — ABNORMAL LOW (ref 150–400)
RBC: 2.81 MIL/uL — ABNORMAL LOW (ref 4.22–5.81)
RDW: 14.2 % (ref 11.5–15.5)
WBC: 11.9 10*3/uL — ABNORMAL HIGH (ref 4.0–10.5)

## 2017-05-02 LAB — PROTIME-INR
INR: 1.63
Prothrombin Time: 19.5 seconds — ABNORMAL HIGH (ref 11.4–15.2)

## 2017-05-02 LAB — MAGNESIUM: Magnesium: 1.7 mg/dL (ref 1.7–2.4)

## 2017-05-02 MED ORDER — MIDAZOLAM HCL 2 MG/2ML IJ SOLN
INTRAMUSCULAR | Status: AC
  Filled 2017-05-02: qty 2

## 2017-05-02 MED ORDER — CHLORHEXIDINE GLUCONATE CLOTH 2 % EX PADS
6.0000 | MEDICATED_PAD | Freq: Every day | CUTANEOUS | Status: DC
  Administered 2017-05-02 – 2017-05-21 (×20): 6 via TOPICAL

## 2017-05-02 MED ORDER — POTASSIUM CHLORIDE 10 MEQ/50ML IV SOLN
10.0000 meq | INTRAVENOUS | Status: AC
  Administered 2017-05-02 (×3): 10 meq via INTRAVENOUS
  Filled 2017-05-02: qty 50

## 2017-05-02 MED ORDER — MIDAZOLAM HCL 2 MG/2ML IJ SOLN
INTRAMUSCULAR | Status: AC
  Administered 2017-05-02: 2 mg
  Filled 2017-05-02: qty 2

## 2017-05-02 MED ORDER — LIDOCAINE HCL (PF) 1 % IJ SOLN
INTRAMUSCULAR | Status: AC
  Administered 2017-05-02: 18:00:00
  Filled 2017-05-02: qty 5

## 2017-05-02 MED ORDER — FENTANYL CITRATE (PF) 100 MCG/2ML IJ SOLN
INTRAMUSCULAR | Status: AC
  Filled 2017-05-02: qty 2

## 2017-05-02 MED ORDER — ALBUMIN HUMAN 5 % IV SOLN
12.5000 g | Freq: Once | INTRAVENOUS | Status: AC
  Administered 2017-05-02: 12.5 g via INTRAVENOUS
  Filled 2017-05-02: qty 250

## 2017-05-02 MED ORDER — FENTANYL CITRATE (PF) 100 MCG/2ML IJ SOLN
INTRAMUSCULAR | Status: AC
  Administered 2017-05-02: 23:00:00
  Filled 2017-05-02: qty 2

## 2017-05-02 MED ORDER — FUROSEMIDE 10 MG/ML IJ SOLN
40.0000 mg | Freq: Once | INTRAMUSCULAR | Status: AC
  Administered 2017-05-02: 40 mg via INTRAVENOUS
  Filled 2017-05-02: qty 4

## 2017-05-02 MED ORDER — LIDOCAINE HCL (PF) 1 % IJ SOLN
INTRAMUSCULAR | Status: AC
  Filled 2017-05-02: qty 30

## 2017-05-02 MED ORDER — FENTANYL 2500MCG IN NS 250ML (10MCG/ML) PREMIX INFUSION
0.0000 ug/h | INTRAVENOUS | Status: DC
  Administered 2017-05-02: 25 ug/h via INTRAVENOUS
  Filled 2017-05-02 (×2): qty 250

## 2017-05-02 MED ORDER — AMIODARONE IV BOLUS ONLY 150 MG/100ML
150.0000 mg | Freq: Once | INTRAVENOUS | Status: AC
  Administered 2017-05-02: 150 mg via INTRAVENOUS

## 2017-05-02 MED ORDER — MAGNESIUM SULFATE 2 GM/50ML IV SOLN
2.0000 g | Freq: Once | INTRAVENOUS | Status: AC
  Administered 2017-05-02: 2 g via INTRAVENOUS
  Filled 2017-05-02: qty 50

## 2017-05-02 MED ORDER — WARFARIN SODIUM 2.5 MG PO TABS
2.5000 mg | ORAL_TABLET | Freq: Every day | ORAL | Status: DC
  Administered 2017-05-02: 2.5 mg via NASOGASTRIC
  Filled 2017-05-02: qty 1

## 2017-05-02 NOTE — Progress Notes (Signed)
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  Underwent ascending aortic replacement/wheat procedure with CABG.  Was progressing well post op and ambulating in hall.  However just after midnight 6/20 had VT arrest with CPR and shock x 3.  Intubated during ACLS, small R ptx noted post CPR and CT placed by CVTS.  PCCM consulted for vent management.   SUBJECTIVE:  Some further runs of VT noted on telemetry overnight Currently on dopamine, norepinephrine, amiodarone drips. Sedation with Precedex, he is able to wake and nod to questions  VITAL SIGNS: BP 105/60   Pulse 80   Temp 97.8 F (36.6 C) (Axillary)   Resp 20   Ht 5\' 10"  (1.778 m)   Wt 69.4 kg (153 lb)   SpO2 100%   BMI 21.95 kg/m   HEMODYNAMICS:    VENTILATOR SETTINGS: Vent Mode: PRVC FiO2 (%):  [40 %-80 %] 40 % Set Rate:  [20 bmp] 20 bmp Vt Set:  [550 mL] 550 mL PEEP:  [5 cmH20] 5 cmH20 Plateau Pressure:  [16 cmH20-17 cmH20] 16 cmH20  INTAKE / OUTPUT: I/O last 3 completed shifts: In: 2938 [I.V.:2738; IV Piggyback:200] Out: 6705 [Urine:6245; Chest Tube:460]  PHYSICAL EXAMINATION: General:  Ill-appearing man on mechanical ventilation Neuro:  Sleeping but wakes easily to voice, nods to questions, follows commands HEENT:  Endotracheal tube in place, oropharynx clear Cardiovascular:  Regular, distant, no rub, sternotomy clean dry and intact Lungs:  Bilateral scattered rhonchi, no wheezes, chest tube without an air leak Abdomen: Soft, nontender, hypoactive bowel sounds Musculoskeletal:   No apparent deformities  LABS:  BMET  Recent Labs Lab 04/30/17 0351  04/30/17 1657 05/01/17 0115 05/02/17 0047  NA 135  --  136 134* 131*  K 4.3  --  4.2 3.7 3.8  CL 106  --  100* 101 97*  CO2 24  --   --  22 25  BUN 12  --  11 12 11   CREATININE 1.05  < > 0.90 1.31* 0.94  GLUCOSE 143*  --  122* 179* 148*  < > = values in this interval not displayed.  Electrolytes  Recent Labs Lab 04/30/17 0351  05/01/17 0115 05/01/17 0737 05/02/17 0047 05/02/17 0200  CALCIUM 8.4*  --  8.0*  --  7.7*  --   MG 2.1  < > 1.8 2.3  --  1.7  < > = values in this interval not displayed.  CBC  Recent Labs Lab 04/30/17 1640 04/30/17 1657 05/01/17 0115 05/02/17 0047  WBC 12.0*  --  16.1* 11.9*  HGB 8.3* 8.2* 8.5* 8.8*  HCT 24.7* 24.0* 25.6* 25.1*  PLT 102*  --  109* 115*    Coag's  Recent Labs Lab 04/25/17 1126 04/29/17 1502 05/01/17 0737 05/02/17 0047  APTT 33 34  --   --   INR 1.16 1.54 1.50 1.63    Sepsis Markers  Recent Labs Lab 05/01/17 0755  LATICACIDVEN 1.2    ABG  Recent Labs Lab 04/30/17 0057 05/01/17 0121 05/01/17 1044  PHART 7.303* 7.266* 7.514*  PCO2ART 46.4 53.7*  34.1  PO2ART 87.0 144.0* 211.0*    Liver Enzymes  Recent Labs Lab 04/25/17 1126 05/01/17 0115 05/02/17 0047  AST 20 42* 36  ALT 10* 18 20  ALKPHOS 51 30* 37*  BILITOT 1.1 0.9 0.7  ALBUMIN 4.1 3.3* 3.0*    Cardiac Enzymes  Recent Labs Lab 05/01/17 0737 05/01/17 1314 05/01/17 2005  TROPONINI 0.46* 0.44* 0.43*    Glucose  Recent Labs Lab 05/01/17 1129 05/01/17 1539 05/01/17 1912 05/01/17 2340 05/02/17 0334 05/02/17 0722  GLUCAP 113* 120* 118* 125* 116* 126*    Imaging Dg Chest Port 1 View  Result Date: 05/02/2017 CLINICAL DATA:  Chest tube. EXAM: PORTABLE CHEST 1 VIEW COMPARISON:  05/01/2017. FINDINGS: Interim placement right PICC line, its tip is at the cavoatrial junction. Interim removal of right IJ sheath. Endotracheal tube and NG tube in stable  position. Right chest tube in stable position. Miniscule residual right apical pneumothorax cannot be excluded on today's exam. This is improved from prior initial study of 05/01/2017. Prior CABG. Prior cardiac valve replacement. Stable cardiomegaly. Atelectasis/consolidation left lung base. Small left pleural effusion cannot be excluded. Right chest wall subcutaneous emphysema . IMPRESSION: 1. Interim placement right PICC line. Its tip is at the cavoatrial junction. Interim removal of right IJ sheath. Endotracheal tube, NG tube, right chest tube in stable position. Miniscule residual right apical pneumothorax cannot be excluded . 2.  Atelectasis/ consolidation left lung base. 3.  Prior CABG and cardiac valve replacement.  Stable cardiomegaly. Electronically Signed   By: Maisie Fus  Register   On: 05/02/2017 07:34     STUDIES:  2D echo 6/20>>> - Left ventricle: The cavity size was normal. Wall thickness was   normal. Systolic function was severely reduced. The estimated   ejection fraction was in the range of 10% to 15%. Doppler   parameters are consistent with abnormal left ventricular   relaxation (grade 1 diastolic dysfunction). - Aortic valve: A mechanical prosthesis was present. - Mitral valve: There was moderate regurgitation. - Left atrium: The atrium was mildly dilated. - Right ventricle: Systolic function was severely reduced. - Right atrium: The atrium was mildly dilated. - Tricuspid valve: There was mild-moderate regurgitation. - Pericardium, extracardiac: There was a left pleural effusion.  CULTURES:   ANTIBIOTICS:   SIGNIFICANT EVENTS: 6/20  LINES/TUBES: ETT 6/20>>> R chest tube 6/20>>>  DISCUSSION: 71yo male with VT arrest POD#3 thoracic aortic aneurysm repair.   ASSESSMENT / PLAN:  PULMONARY Acute respiratory failure - post VT arrest  Hx COPD  P:   Push for pressure support on 6/21. Goal extubation if no barriers noted by EP or TCTS PRVC 8cc/kg for rest  mode Continue scheduled BD  CARDIOVASCULAR VT arrest 6/20 - unclear etiology.  Was not cooled overnight r/t short duration CPR, good mental status post arrest.  Repair thoracic aortic aneurysm 6/18 LV dysfunction - previous EF 30-35%  Hx AVR  Acute on chronic CHF - EF 10-15% Shock - cardiogenic.   P:  Appreciate EP evaluation. He will need an AICD Other postop management, drip management per TCTS plans Currently on amiodarone, dopa, norepi; bolus amiodarone if runs of VT Beta blocker on hold ASA  RENAL AKI - mild, improved 6/21 P:   Follow BMP, urine output   GASTROINTESTINAL No active issue  P:   PPI as ordered Consider initiation tube feeding if he is not successfully extubated next 24 hours  HEMATOLOGIC Chronic anticoagulation  P: lovenox  CVTS dosing coumadin   INFECTIOUS No active issue  P:   Follow clinically off antibiotics  ENDOCRINE Hyperglycemia   P:   Sliding scale insulin   NEUROLOGIC Sedation needs on vent - mental status appears intact post arrest.  Follows commands per RN and family.  P:   RASS goal:-1 to 0 Wean Precedex Morphine and Versed ordered as needed   FAMILY  - Updates:  Wife and son updated at bedside 6/20.   - Inter-disciplinary family meet or Palliative Care meeting due by:  6/27   Independent critical care time is 34 minutes.   Levy Pupa, MD, PhD 05/02/2017, 9:04 AM [facility] [phone] or if no answer [phone]

## 2017-05-02 NOTE — Progress Notes (Signed)
      [address]       [name] [address]             [phone]      Events of day noted- extubated earlier in day. SQ emphysema worsened, now has second tube on the right. Has had some runs of PVCs- bolused with amiodarone  Respiratory status remains tenuous, need to monitor closely  Jack Spare C. Dorris Fetch, MD [facility] [phone]

## 2017-05-02 NOTE — Procedures (Signed)
Intubation Procedure Note Mamadou Breon [id number] Apr 08, 1946  Procedure: Intubation Indications: Respiratory insufficiency  Procedure Details Consent: Risks of procedure as well as the alternatives and risks of each were explained to the (patient/caregiver).  Consent for procedure obtained. Time Out: Verified patient identification, verified procedure, site/side was marked, verified correct patient position, special equipment/implants available, medications/allergies/relevent history reviewed, required imaging and test results available.  Performed  Maximum sterile technique was used including gloves, gown, hand hygiene and mask.  MAC and 4    Evaluation Hemodynamic Status: BP stable throughout; O2 sats: stable throughout Patient's Current Condition: stable Complications: No apparent complications Patient did tolerate procedure well. Chest X-ray ordered to verify placement.  CXR: pending.   Raylene Miyamoto 05/02/2017

## 2017-05-02 NOTE — Progress Notes (Signed)
6/21 between 0000-0100, patient had two distinct runs of nonsustained Vtach. Strips printed and placed in patients chart. Morning labs obtained early at 0045. When labs resulted, K+ 3.8 and Mag was added on and resulted at 1.7. Cards Fellow consulted and paged regarding results. No retun page. Patient runs of Vtach subsided. Patient alert and denying pain during time of events.

## 2017-05-02 NOTE — Progress Notes (Signed)
Patient ID: Jack Joseph, male   DOB: 1946/02/14, 71 y.o.   MRN: [id number] EVENING ROUNDS NOTE :     [address]       [place] [address]             [phone]                 3 Days Post-Op Procedure(s) (LRB): REDO STERNOTOMY (N/A) REPLACEMENT ASCENDING AORTA/ WHEAT PROCEDURE, HYPOTHERMIC CIRCULATORY ARREST AND CARDIOPULMONARY BYPASS, AND USE OF HEMASHIELD PLATINUM WOVEN DOUBLE VELOUR VASCULAR GRAFT 32 MM X 50 CM (N/A) TRANSESOPHAGEAL ECHOCARDIOGRAM (TEE) (N/A)  Total Length of Stay:  LOS: 3 days  BP (!) 92/56   Pulse 79   Temp 98.5 F (36.9 C) (Axillary)   Resp (!) 37   Ht 5\' 10"  (1.778 m)   Wt 153 lb (69.4 kg)   SpO2 100%   BMI 21.95 kg/m   .Intake/Output      06/20 0701 - 06/21 0700 06/21 0701 - 06/22 0700   I.V. (mL/kg) 2102.7 (30.3) 731.7 (10.5)   IV Piggyback  400   Total Intake(mL/kg) 2102.7 (30.3) 1131.7 (16.3)   Urine (mL/kg/hr) 3770 (2.3) 1175 (1.5)   Chest Tube 160 (0.1) 0 (0)   Total Output 3930 1175   Net -1827.3 -43.3          . sodium chloride Stopped (04/30/17 0900)  . sodium chloride 250 mL (05/01/17 1000)  . sodium chloride Stopped (04/29/17 2000)  . amiodarone 30 mg/hr (05/02/17 1700)  . dexmedetomidine (PRECEDEX) IV infusion Stopped (05/02/17 1136)  . DOPamine 2.989 mcg/kg/min (05/02/17 1700)  . lactated ringers    . lactated ringers    . lactated ringers 20 mL/hr at 05/02/17 1700  . norepinephrine (LEVOPHED) Adult infusion 6 mcg/min (05/02/17 1700)     Lab Results  Component Value Date   WBC 11.9 (H) 05/02/2017   HGB 8.8 (L) 05/02/2017   HCT 25.1 (L) 05/02/2017   PLT 115 (L) 05/02/2017   GLUCOSE 148 (H) 05/02/2017   CHOL 199 01/22/2017   TRIG 71 01/22/2017   HDL 64 01/22/2017   LDLCALC 121 (H) 01/22/2017   ALT 20 05/02/2017   AST 36 05/02/2017   NA 131 (L) 05/02/2017   K 3.8 05/02/2017   CL 97 (L) 05/02/2017   CREATININE 0.94 05/02/2017   BUN 11 05/02/2017   CO2 25 05/02/2017   TSH 5.390 (H)  01/22/2017   INR 1.63 05/02/2017   HGBA1C 5.7 (H) 04/25/2017   Patient extubated, neuro intact , rapid respiratory rate  Has required bolus of Cordarone After extubation sub q air especially on the right increased , repeat chest xray reviewed. With increased sub q air and right ptx second chest tube to be placed. Discussed with patient and his wife   [name] MD  Beeper [phone] Office [phone] 05/02/2017 6:23 PM

## 2017-05-02 NOTE — Progress Notes (Signed)
Electrophysiology Rounding Note  Patient Name: Jack Joseph Date of Encounter: 05/02/2017  Primary Cardiologist: [place] Electrophysiologist: Lovena Le   Subjective   The patient remains intubated on vent. Awake and denies pain.   Inpatient Medications    Scheduled Meds: . acetaminophen  1,000 mg Oral Q6H   Or  . acetaminophen (TYLENOL) oral liquid 160 mg/5 mL  1,000 mg Per Tube Q6H  . aspirin EC  81 mg Oral Daily   Or  . aspirin  81 mg Per Tube Daily  . bisacodyl  10 mg Oral Daily   Or  . bisacodyl  10 mg Rectal Daily  . chlorhexidine gluconate (MEDLINE KIT)  15 mL Mouth Rinse BID  . Chlorhexidine Gluconate Cloth  6 each Topical Daily  . docusate sodium  200 mg Oral Daily  . enoxaparin (LOVENOX) injection  30 mg Subcutaneous Q24H  . furosemide  40 mg Intravenous Once  . insulin aspart  0-24 Units Subcutaneous Q4H  . levalbuterol  0.63 mg Nebulization Q6H  . mouth rinse  15 mL Mouth Rinse 10 times per day  . pantoprazole (PROTONIX) IV  40 mg Intravenous Q24H  . sodium chloride flush  10-40 mL Intracatheter Q12H  . sodium chloride flush  10-40 mL Intracatheter Q12H  . sodium chloride flush  3 mL Intravenous Q12H  . warfarin  2.5 mg Per NG tube q1800  . Warfarin - Physician Dosing Inpatient   Does not apply q1800   Continuous Infusions: . sodium chloride Stopped (04/30/17 0900)  . sodium chloride 250 mL (05/01/17 1000)  . sodium chloride Stopped (04/29/17 2000)  . amiodarone 30 mg/hr (05/02/17 0213)  . dexmedetomidine (PRECEDEX) IV infusion 1.2 mcg/kg/hr (05/02/17 0421)  . DOPamine 3 mcg/kg/min (05/01/17 2300)  . lactated ringers    . lactated ringers    . lactated ringers 20 mL/hr at 05/01/17 2300  . magnesium sulfate 1 - 4 g bolus IVPB 2 g (05/02/17 0759)  . norepinephrine (LEVOPHED) Adult infusion 5 mcg/min (05/02/17 0620)  . potassium chloride     PRN Meds: sodium chloride, lactated ringers, midazolam, morphine injection, ondansetron (ZOFRAN) IV, sodium  chloride flush, sodium chloride flush, sodium chloride flush   Vital Signs    Vitals:   05/02/17 0715 05/02/17 0800 05/02/17 0825 05/02/17 0827  BP:      Pulse: 80     Resp: 20     Temp:  97.8 F (36.6 C)    TempSrc:  Axillary    SpO2: 100%  100% 100%  Weight:      Height:        Intake/Output Summary (Last 24 hours) at 05/02/17 0932 Last data filed at 05/02/17 0700  Gross per 24 hour  Intake          1743.81 ml  Output             3680 ml  Net         -1936.19 ml   Filed Weights   04/30/17 0600 05/01/17 0900 05/02/17 0500  Weight: 158 lb 4.8 oz (71.8 kg) 160 lb 0.9 oz (72.6 kg) 153 lb (69.4 kg)    Physical Exam    GEN- awake on vent, follows commands Head- normocephalic, atraumatic Eyes-  Sclera clear, conjunctiva pink Ears- hearing intact Oropharynx- +ETT Neck- supple Lungs- +vent Heart- Regular rate and rhythm, no murmurs, rubs or gallops GI- soft, NT, ND, + BS Extremities- no clubbing, cyanosis, or edema Skin- no rash or lesion Psych- euthymic mood, full  affect Neuro- strength and sensation are intact  Labs    CBC  Recent Labs  05/01/17 0115 05/02/17 0047  WBC 16.1* 11.9*  HGB 8.5* 8.8*  HCT 25.6* 25.1*  MCV 93.1 89.3  PLT 109* 115*   Basic Metabolic Panel  Recent Labs  05/01/17 0115 05/01/17 0737 05/02/17 0047 05/02/17 0200  NA 134*  --  131*  --   K 3.7  --  3.8  --   CL 101  --  97*  --   CO2 22  --  25  --   GLUCOSE 179*  --  148*  --   BUN 12  --  11  --   CREATININE 1.31*  --  0.94  --   CALCIUM 8.0*  --  7.7*  --   MG 1.8 2.3  --  1.7   Liver Function Tests  Recent Labs  05/01/17 0115 05/02/17 0047  AST 42* 36  ALT 18 20  ALKPHOS 30* 37*  BILITOT 0.9 0.7  PROT 5.1* 5.0*  ALBUMIN 3.3* 3.0*   No results for input(s): LIPASE, AMYLASE in the last 72 hours. Cardiac Enzymes  Recent Labs  05/01/17 0344 05/01/17 0737 05/01/17 1314 05/01/17 2005  CKTOTAL 474*  --   --   --   CKMB 16.7*  --   --   --   TROPONINI  --   0.46* 0.44* 0.43*    Telemetry    Sinus rhythm with runs of VT overnight (2 distinct morphologies)  (personally reviewed)  Radiology    Dg Chest Port 1 View  Result Date: 05/02/2017 CLINICAL DATA:  Chest tube. EXAM: PORTABLE CHEST 1 VIEW COMPARISON:  05/01/2017. FINDINGS: Interim placement right PICC line, its tip is at the cavoatrial junction. Interim removal of right IJ sheath. Endotracheal tube and NG tube in stable position. Right chest tube in stable position. Miniscule residual right apical pneumothorax cannot be excluded on today's exam. This is improved from prior initial study of 05/01/2017. Prior CABG. Prior cardiac valve replacement. Stable cardiomegaly. Atelectasis/consolidation left lung base. Small left pleural effusion cannot be excluded. Right chest wall subcutaneous emphysema . IMPRESSION: 1. Interim placement right PICC line. Its tip is at the cavoatrial junction. Interim removal of right IJ sheath. Endotracheal tube, NG tube, right chest tube in stable position. Miniscule residual right apical pneumothorax cannot be excluded . 2.  Atelectasis/ consolidation left lung base. 3.  Prior CABG and cardiac valve replacement.  Stable cardiomegaly. Electronically Signed   By: Thomas  Register   On: 05/02/2017 07:34   Dg Chest Port 1 View  Result Date: 05/01/2017 CLINICAL DATA:  Pneumothorax post chest tube placement. Complication of chest tube. EXAM: PORTABLE CHEST 1 VIEW COMPARISON:  Earlier this day at 0103 hour FINDINGS: Post placement of right-sided chest tube, right apical pneumothorax is no longer seen. Large amount of subcutaneous emphysema about the right lateral chest wall. Right internal jugular sheath remains in place. Endotracheal and enteric tubes in place. Post median sternotomy with unchanged heart size and mediastinal contours from prior. Hazy lung base opacities, right greater than left, similar to prior exam. IMPRESSION: 1. Post right-sided chest tube placement, right  apical pneumothorax not definitively visualized. Large amount of subcutaneous emphysema about the right lateral chest wall. 2. Endotracheal and enteric tubes in place. Right internal jugular sheath remains in place. 3. Grossly stable bibasilar atelectasis. Electronically Signed   By: Melanie  Ehinger M.D.   On: 05/01/2017 03:10   Dg Chest Port 1   View  Result Date: 05/01/2017 CLINICAL DATA:  71-year-old male status post open heart surgery and intubation. EXAM: PORTABLE CHEST 1 VIEW COMPARISON:  Chest radiograph dated 04/30/2017 FINDINGS: There has been interval placement of an endotracheal tube with tip approximately 4.5 cm above the carina. An enteric tube extends inferior to the diaphragm with tip beyond the inferior margin of the image. There has been interval removal of the previously seen Swan-Ganz catheter and inferiorly positioned mediastinal drain. The right IJ central venous line is noted with tip over central SVC. Bilateral mid to lower lung field hazy densities, likely atelectatic changes. There is no significant pleural effusion, however the costophrenic angles have been excluded from the image. There is a small right pneumothorax measuring up to 16 mm from the apical pleural surface. Stable cardiopericardial silhouette. Prominence of the bilateral hilar vasculature. Median sternotomy wires and cardiac valve replacement as seen on the prior radiograph. IMPRESSION: 1. Right-sided pneumothorax measuring 16 mm from the apical pleural surface, approximately less than 10%. 2. Interval placement of a right IJ central venous line, endotracheal tube, and enteric tube and removal of the Swan-Ganz catheter and mediastinal drain. 3. Bilateral mid to lower lung field hazy densities, likely atelectatic changes. 4. Grossly stable cardiopericardial silhouette size. These results were called by telephone at the time of interpretation on 05/01/2017 at 1:25 am to Dr. CLARENCE OWEN , who verbally acknowledged these  results. Electronically Signed   By: Arash  Radparvar M.D.   On: 05/01/2017 01:35    Assessment & Plan    1.  VT arrest Continue IV amiodarone for now. Recurrent runs of VT last night.  Electrolytes being supplemented. Ok to rebolus amio if needed for recurrent VT Keep K >3.9, Mg >1.8 He has a longstanding cardiomyopathy and will require ICD prior to discharge.   No driving x6 months   2.  Acute on chronic systolic heart failure Appreciated AHF team's assistance  3.  S/p ascending aorta replacement 04/29/17 Per TCTS  Signed, Amber Seiler, NP  05/02/2017, 8:33 AM   EP Attending  Patient seen and examined. Agree with above history , exam, assessment and plan by Amber Seiler, NP-C. The patient remains with VDRF as a consequence of his VT and Aortic root replacement. He is able to awaken with stimulation. Lungs reveals scattered rales, cv reveals a RRR and extremities are warm. He remains on IV pressors and amiodarone. No sustained VT over last night. Continue IV amio as long as he is on ventilator and transition to oral when extubated.   Gregg Taylor,M.D. 

## 2017-05-02 NOTE — Progress Notes (Signed)
Patient ID: Jack Joseph, male   DOB: 03/28/1946, 71 y.o.   MRN: [id number] TCTS DAILY ICU PROGRESS NOTE                   [address]            [place] [id number]          [phone]   3 Days Post-Op Procedure(s) (LRB): REDO STERNOTOMY (N/A) REPLACEMENT ASCENDING AORTA/ WHEAT PROCEDURE, HYPOTHERMIC CIRCULATORY ARREST AND CARDIOPULMONARY BYPASS, AND USE OF HEMASHIELD PLATINUM WOVEN DOUBLE VELOUR VASCULAR GRAFT 32 MM X 50 CM (N/A) TRANSESOPHAGEAL ECHOCARDIOGRAM (TEE) (N/A)  Total Length of Stay:  LOS: 3 days   Subjective: Awake on vent, fio still on 70%  Objective: Vital signs in last 24 hours: Temp:  [97.2 F (36.2 C)-99.7 F (37.6 C)] 98.4 F (36.9 C) (06/21 0355) Pulse Rate:  [63-93] 80 (06/21 0715) Cardiac Rhythm: Normal sinus rhythm (06/21 0400) Resp:  [13-25] 20 (06/21 0715) BP: (72-137)/(40-94) 105/60 (06/21 0700) SpO2:  [100 %] 100 % (06/21 0715) Arterial Line BP: (77-177)/(38-82) 124/52 (06/21 0715) FiO2 (%):  [70 %-80 %] 70 % (06/21 0400) Weight:  [153 lb (69.4 kg)-160 lb 0.9 oz (72.6 kg)] 153 lb (69.4 kg) (06/21 0500)  Filed Weights   04/30/17 0600 05/01/17 0900 05/02/17 0500  Weight: 158 lb 4.8 oz (71.8 kg) 160 lb 0.9 oz (72.6 kg) 153 lb (69.4 kg)    Weight change:    Hemodynamic parameters for last 24 hours:    Intake/Output from previous day: 06/20 0701 - 06/21 0700 In: 2102.7 [I.V.:2102.7] Out: 3662 [Urine:3770; Chest Tube:160]  Intake/Output this shift: No intake/output data recorded.  Current Meds: Scheduled Meds: . acetaminophen  1,000 mg Oral Q6H   Or  . acetaminophen (TYLENOL) oral liquid 160 mg/5 mL  1,000 mg Per Tube Q6H  . aspirin EC  81 mg Oral Daily   Or  . aspirin  81 mg Per Tube Daily  . bisacodyl  10 mg Oral Daily   Or  . bisacodyl  10 mg Rectal Daily  . chlorhexidine gluconate (MEDLINE KIT)  15 mL Mouth Rinse BID  . Chlorhexidine Gluconate Cloth  6 each Topical Daily  . docusate sodium  200 mg Oral  Daily  . enoxaparin (LOVENOX) injection  30 mg Subcutaneous Q24H  . insulin aspart  0-24 Units Subcutaneous Q4H  . levalbuterol  0.63 mg Nebulization Q6H  . mouth rinse  15 mL Mouth Rinse 10 times per day  . pantoprazole (PROTONIX) IV  40 mg Intravenous Q24H  . sodium chloride flush  10-40 mL Intracatheter Q12H  . sodium chloride flush  10-40 mL Intracatheter Q12H  . sodium chloride flush  3 mL Intravenous Q12H  . Warfarin - Physician Dosing Inpatient   Does not apply q1800   Continuous Infusions: . sodium chloride Stopped (04/30/17 0900)  . sodium chloride 250 mL (05/01/17 1000)  . sodium chloride Stopped (04/29/17 2000)  . amiodarone 30 mg/hr (05/02/17 0213)  . dexmedetomidine (PRECEDEX) IV infusion 1.2 mcg/kg/hr (05/02/17 0421)  . DOPamine 3 mcg/kg/min (05/01/17 2300)  . lactated ringers    . lactated ringers    . lactated ringers 20 mL/hr at 05/01/17 2300  . norepinephrine (LEVOPHED) Adult infusion 5 mcg/min (05/02/17 0620)   PRN Meds:.sodium chloride, lactated ringers, midazolam, morphine injection, ondansetron (ZOFRAN) IV, sodium chloride flush, sodium chloride flush, sodium chloride flush  General appearance: alert and cooperative Neurologic: intact Heart: regular rate and rhythm, S1, S2  normal, no murmur, click, rub or gallop and with couplets  Lungs: diminished breath sounds bibasilar Abdomen: soft, non-tender; bowel sounds normal; no masses,  no organomegaly Extremities: extremities normal, atraumatic, no cyanosis or edema and Homans sign is negative, no sign of DVT Wound: sternum intact after cpr done two nights ago  Lab Results: CBC: Recent Labs  05/01/17 0115 05/02/17 0047  WBC 16.1* 11.9*  HGB 8.5* 8.8*  HCT 25.6* 25.1*  PLT 109* 115*   BMET:  Recent Labs  05/01/17 0115 05/02/17 0047  NA 134* 131*  K 3.7 3.8  CL 101 97*  CO2 22 25  GLUCOSE 179* 148*  BUN 12 11  CREATININE 1.31* 0.94  CALCIUM 8.0* 7.7*    CMET: Lab Results  Component Value Date    WBC 11.9 (H) 05/02/2017   HGB 8.8 (L) 05/02/2017   HCT 25.1 (L) 05/02/2017   PLT 115 (L) 05/02/2017   GLUCOSE 148 (H) 05/02/2017   CHOL 199 01/22/2017   TRIG 71 01/22/2017   HDL 64 01/22/2017   LDLCALC 121 (H) 01/22/2017   ALT 20 05/02/2017   AST 36 05/02/2017   NA 131 (L) 05/02/2017   K 3.8 05/02/2017   CL 97 (L) 05/02/2017   CREATININE 0.94 05/02/2017   BUN 11 05/02/2017   CO2 25 05/02/2017   TSH 5.390 (H) 01/22/2017   INR 1.63 05/02/2017   HGBA1C 5.7 (H) 04/25/2017      PT/INR:  Recent Labs  05/02/17 0047  LABPROT 19.5*  INR 1.63   Radiology: No results found.   Assessment/Plan: S/P Procedure(s) (LRB): REDO STERNOTOMY (N/A) REPLACEMENT ASCENDING AORTA/ WHEAT PROCEDURE, HYPOTHERMIC CIRCULATORY ARREST AND CARDIOPULMONARY BYPASS, AND USE OF HEMASHIELD PLATINUM WOVEN DOUBLE VELOUR VASCULAR GRAFT 32 MM X 50 CM (N/A) TRANSESOPHAGEAL ECHOCARDIOGRAM (TEE) (N/A) Mobilize Replace k and mag Wean fios to 40 % if tolerated consider weaning to extubation EP has seen to address vt last pm  pic line in place  On coumadin and Cordarone, tsh mildly elevated    Jack Joseph 05/02/2017 7:36 AM

## 2017-05-02 NOTE — Progress Notes (Signed)
Interval PCCM Note  Evaluated pt off sedation and on PSV 10.  Comfortable resp pattern. He denies chest pain w a deep insp. Decreased to PSV 5 and VT ~ 300cc. He can take a deeper breath if prompted to do so. Decent cough. No wheeze on exam.   Will proceed w extubation, will need to push IS and pulm hygiene.   Independent CC time 30 minutes  Levy Pupa, MD, PhD 05/02/2017, 1:12 PM [facility] [phone] or if no answer [phone]

## 2017-05-02 NOTE — Procedures (Signed)
      [address]       [name] [address]             [phone]      Chest Tube Insertion Procedure Note  Indications:  Clinically significant Pneumothorax  Pre-operative Diagnosis: Pneumothorax  Post-operative Diagnosis: Pneumothorax  Procedure Details  Informed consent was obtained for the procedure, including sedation.  Risks of lung perforation, hemorrhage, arrhythmia, and adverse drug reaction were discussed.   After sterile skin prep, using standard technique, a 14 French tube pig tail cathwas placed in the right anterior 5 rib space.  Findings: Air returned   Estimated Blood Loss:  Minimal         Specimens:  None              Complications:  None; patient tolerated the procedure well.         Disposition: in icu         Condition: stable  Attending Attestation: I performed the procedure.

## 2017-05-02 NOTE — Progress Notes (Signed)
Patient having frequent runs of vtach, paged EP, Dr. Ladona Ridgel on call who gave verbal orders to give one bolus of amio now and a second one if frequent runs of vtach continue, if vtach does not subside following two bolus of Amio give Dr. Ladona Ridgel a call back for more orders, verbal orders also given to titrate vasopressors to off when able.  Jack Barters, RN

## 2017-05-02 NOTE — Progress Notes (Signed)
Advanced Heart Failure Rounding Note   Subjective:    Remains intubated. Awake on vent. Had 2 episodes of VT overnight. Remains on iv amio.   Down to 40% FiO2. Renal function stable. K and Mag low.    Objective:   Weight Range:  Vital Signs:   Temp:  [97.8 F (36.6 C)-99.7 F (37.6 C)] 98.5 F (36.9 C) (06/21 1200) Pulse Rate:  [64-93] 78 (06/21 1500) Resp:  [13-33] 26 (06/21 1500) BP: (84-137)/(45-94) 102/56 (06/21 1500) SpO2:  [96 %-100 %] 96 % (06/21 1500) Arterial Line BP: (77-177)/(38-82) 118/44 (06/21 1500) FiO2 (%):  [40 %-70 %] 40 % (06/21 1356) Weight:  [69.4 kg (153 lb)] 69.4 kg (153 lb) (06/21 0500) Last BM Date: 04/28/17  Weight change: Filed Weights   04/30/17 0600 05/01/17 0900 05/02/17 0500  Weight: 71.8 kg (158 lb 4.8 oz) 72.6 kg (160 lb 0.9 oz) 69.4 kg (153 lb)    Intake/Output:   Intake/Output Summary (Last 24 hours) at 05/02/17 1706 Last data filed at 05/02/17 1500  Gross per 24 hour  Intake          2307.21 ml  Output             3255 ml  Net          -947.79 ml     Physical Exam: General:  Awake on vent HEENT: normal x for ETT Neck: supple. no JVD. Carotids 2+ bilat; no bruits. No lymphadenopathy or thryomegaly appreciated. Cor: PMI nondisplaced. Regular rate & rhythm. Mechanical s2  Lungs: clear with decreased BS  Abdomen: soft, nontender, nondistended. No hepatosplenomegaly. No bruits or masses. Good bowel sounds. Extremities: no cyanosis, clubbing, rash, edema Neuro: intubated. Awake on event   Telemetry: NSR 70s 2 runs VT Personally reviewed   Labs: Basic Metabolic Panel:  Recent Labs Lab 04/29/17 2123  04/30/17 0351 04/30/17 1640 04/30/17 1657 05/01/17 0115 05/01/17 0737 05/02/17 0047 05/02/17 0200  NA 138  --  135  --  136 134*  --  131*  --   K 4.3  --  4.3  --  4.2 3.7  --  3.8  --   CL 102  --  106  --  100* 101  --  97*  --   CO2  --   --  24  --   --  22  --  25  --   GLUCOSE 122*  --  143*  --  122* 179*   --  148*  --   BUN 13  --  12  --  11 12  --  11  --   CREATININE 0.90  < > 1.05 1.05 0.90 1.31*  --  0.94  --   CALCIUM  --   --  8.4*  --   --  8.0*  --  7.7*  --   MG  --   < > 2.1 1.9  --  1.8 2.3  --  1.7  < > = values in this interval not displayed.  Liver Function Tests:  Recent Labs Lab 05/01/17 0115 05/02/17 0047  AST 42* 36  ALT 18 20  ALKPHOS 30* 37*  BILITOT 0.9 0.7  PROT 5.1* 5.0*  ALBUMIN 3.3* 3.0*   No results for input(s): LIPASE, AMYLASE in the last 168 hours. No results for input(s): AMMONIA in the last 168 hours.  CBC:  Recent Labs Lab 04/29/17 2144 04/30/17 0351 04/30/17 1640 04/30/17 1657 05/01/17 0115 05/02/17 0047  WBC 13.1* 12.8* 12.0*  --  16.1* 11.9*  HGB 8.3* 8.3* 8.3* 8.2* 8.5* 8.8*  HCT 25.0* 24.8* 24.7* 24.0* 25.6* 25.1*  MCV 92.6 91.9 92.2  --  93.1 89.3  PLT 113* 115* 102*  --  109* 115*    Cardiac Enzymes:  Recent Labs Lab 05/01/17 0344 05/01/17 0737 05/01/17 1314 05/01/17 2005  CKTOTAL 474*  --   --   --   CKMB 16.7*  --   --   --   TROPONINI  --  0.46* 0.44* 0.43*    BNP: BNP (last 3 results) No results for input(s): BNP in the last 8760 hours.  ProBNP (last 3 results) No results for input(s): PROBNP in the last 8760 hours.    Other results:  Imaging: Dg Chest Port 1 View  Result Date: 05/02/2017 CLINICAL DATA:  Chest asymmetry right-greater-than-left EXAM: PORTABLE CHEST 1 VIEW COMPARISON:  Portable chest x-ray of May 02, 2017 FINDINGS: There is a very large amount of subcutaneous emphysema over the right chest, axillary region, and the base of the neck on the right with a small amount of subcutaneous emphysema at the base of the neck on the left. There is a pneumothorax on the right the tip pleural line visible along the lower lateral thoracic wall. It is difficult to demonstrate a pleural line more superiorly. The tip and proximal port of the left chest tube overlie the posterior aspects of the right ninth and  eighth ribs respectively. There is no mediastinal shift. There is increased density in the left lower lobe which is stable. The mid and upper left lung are clear. The heart is top-normal in size. The pulmonary vascularity is normal. There is soft tissue fullness in the left hilar region which is stable. The sternal wires are intact. There is a prosthetic aortic valve. The right-sided PICC line tip projects over the midportion of the SVC. IMPRESSION: Extensive subcutaneous emphysema on the right likely is responsible for the asymmetry of the thorax. A poorly defined at least 20% right-sided pneumothorax is present and more conspicuous than on the previous study. The right chest tube is in position as described. Persistent left lower lobe atelectasis or pneumonia. Electronically Signed   By: David  Martinique M.D.   On: 05/02/2017 16:25   Dg Chest Port 1 View  Result Date: 05/02/2017 CLINICAL DATA:  Chest tube. EXAM: PORTABLE CHEST 1 VIEW COMPARISON:  05/01/2017. FINDINGS: Interim placement right PICC line, its tip is at the cavoatrial junction. Interim removal of right IJ sheath. Endotracheal tube and NG tube in stable position. Right chest tube in stable position. Miniscule residual right apical pneumothorax cannot be excluded on today's exam. This is improved from prior initial study of 05/01/2017. Prior CABG. Prior cardiac valve replacement. Stable cardiomegaly. Atelectasis/consolidation left lung base. Small left pleural effusion cannot be excluded. Right chest wall subcutaneous emphysema . IMPRESSION: 1. Interim placement right PICC line. Its tip is at the cavoatrial junction. Interim removal of right IJ sheath. Endotracheal tube, NG tube, right chest tube in stable position. Miniscule residual right apical pneumothorax cannot be excluded . 2.  Atelectasis/ consolidation left lung base. 3.  Prior CABG and cardiac valve replacement.  Stable cardiomegaly. Electronically Signed   By: Marcello Moores  Register   On:  05/02/2017 07:34   Dg Chest Port 1 View  Result Date: 05/01/2017 CLINICAL DATA:  Pneumothorax post chest tube placement. Complication of chest tube. EXAM: PORTABLE CHEST 1 VIEW COMPARISON:  Earlier this day at 0103 hour  FINDINGS: Post placement of right-sided chest tube, right apical pneumothorax is no longer seen. Large amount of subcutaneous emphysema about the right lateral chest wall. Right internal jugular sheath remains in place. Endotracheal and enteric tubes in place. Post median sternotomy with unchanged heart size and mediastinal contours from prior. Hazy lung base opacities, right greater than left, similar to prior exam. IMPRESSION: 1. Post right-sided chest tube placement, right apical pneumothorax not definitively visualized. Large amount of subcutaneous emphysema about the right lateral chest wall. 2. Endotracheal and enteric tubes in place. Right internal jugular sheath remains in place. 3. Grossly stable bibasilar atelectasis. Electronically Signed   By: Jeb Levering M.D.   On: 05/01/2017 03:10   Dg Chest Port 1 View  Result Date: 05/01/2017 CLINICAL DATA:  71 year old male status post open heart surgery and intubation. EXAM: PORTABLE CHEST 1 VIEW COMPARISON:  Chest radiograph dated 04/30/2017 FINDINGS: There has been interval placement of an endotracheal tube with tip approximately 4.5 cm above the carina. An enteric tube extends inferior to the diaphragm with tip beyond the inferior margin of the image. There has been interval removal of the previously seen Swan-Ganz catheter and inferiorly positioned mediastinal drain. The right IJ central venous line is noted with tip over central SVC. Bilateral mid to lower lung field hazy densities, likely atelectatic changes. There is no significant pleural effusion, however the costophrenic angles have been excluded from the image. There is a small right pneumothorax measuring up to 16 mm from the apical pleural surface. Stable cardiopericardial  silhouette. Prominence of the bilateral hilar vasculature. Median sternotomy wires and cardiac valve replacement as seen on the prior radiograph. IMPRESSION: 1. Right-sided pneumothorax measuring 16 mm from the apical pleural surface, approximately less than 10%. 2. Interval placement of a right IJ central venous line, endotracheal tube, and enteric tube and removal of the Swan-Ganz catheter and mediastinal drain. 3. Bilateral mid to lower lung field hazy densities, likely atelectatic changes. 4. Grossly stable cardiopericardial silhouette size. These results were called by telephone at the time of interpretation on 05/01/2017 at 1:25 am to Dr. Darylene Price , who verbally acknowledged these results. Electronically Signed   By: Anner Crete M.D.   On: 05/01/2017 01:35      Medications:     Scheduled Medications: . acetaminophen  1,000 mg Oral Q6H   Or  . acetaminophen (TYLENOL) oral liquid 160 mg/5 mL  1,000 mg Per Tube Q6H  . aspirin EC  81 mg Oral Daily   Or  . aspirin  81 mg Per Tube Daily  . bisacodyl  10 mg Oral Daily   Or  . bisacodyl  10 mg Rectal Daily  . chlorhexidine gluconate (MEDLINE KIT)  15 mL Mouth Rinse BID  . Chlorhexidine Gluconate Cloth  6 each Topical Daily  . docusate sodium  200 mg Oral Daily  . enoxaparin (LOVENOX) injection  30 mg Subcutaneous Q24H  . fentaNYL      . insulin aspart  0-24 Units Subcutaneous Q4H  . levalbuterol  0.63 mg Nebulization Q6H  . mouth rinse  15 mL Mouth Rinse 10 times per day  . pantoprazole (PROTONIX) IV  40 mg Intravenous Q24H  . sodium chloride flush  10-40 mL Intracatheter Q12H  . sodium chloride flush  10-40 mL Intracatheter Q12H  . sodium chloride flush  3 mL Intravenous Q12H  . warfarin  2.5 mg Per NG tube q1800  . Warfarin - Physician Dosing Inpatient   Does not apply [phone]  Infusions: . sodium chloride Stopped (04/30/17 0900)  . sodium chloride 250 mL (05/01/17 1000)  . sodium chloride Stopped (04/29/17 2000)  .  amiodarone 30 mg/hr (05/02/17 1448)  . dexmedetomidine (PRECEDEX) IV infusion Stopped (05/02/17 1136)  . DOPamine 2.989 mcg/kg/min (05/02/17 1400)  . lactated ringers    . lactated ringers    . lactated ringers 20 mL/hr at 05/02/17 1400  . norepinephrine (LEVOPHED) Adult infusion 4 mcg/min (05/02/17 1514)     PRN Medications:  sodium chloride, lactated ringers, midazolam, morphine injection, ondansetron (ZOFRAN) IV, sodium chloride flush, sodium chloride flush, sodium chloride flush   Assessment:   1. VT arrest with recurrent  - EP folllowing. Continue IV amio. Supp K and Mag. Goal K > 4.0 and Mg > 2.0.  - Plan ICD prior to d/c 2. Acute on chronic systolic CHF - EF now down to 10-15%. Suspect acute drop related to recent cardiac surgery, bypass, and arrest. Will need repeat echo at some point to look for recovery.  - Fluid status looks find. CVP 7.  - Remains on dopamine 3 and norepi at 4. Coox 67% yesterday. Will wean as tolerated  - No BB with shock s/p arrest. - No room for ACE/ARB/ARNI/spiro.  3. 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  4. Acute hypoxic respiratory failure - Remains intubated and sedated  - He has severe COPD. Wean vent as tolerated per CCM. 5. Non-obstructive CAD - Cath 03/05/17 states "non-obstructive CAD", though surgical history in chart shows "CABG". This was not present in pts chart until this admission.  - Continue medical therapy.  6. AKI - Mild. Likely in setting of arrest. Now resolved  7. R PTX - R apical pneumo with chest tube in place. TCTS managing  8. 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 INR 1.6 - Discussed with PharmD  CRITICAL CARE Performed by: Glori Bickers  Total critical care time: 35 minutes  Critical care time was exclusive of separately billable procedures and treating other patients.  Critical care was necessary to treat or prevent  imminent or life-threatening deterioration.  Critical care was time spent personally by me (independent of midlevel providers or residents) on the following activities: development of treatment plan with patient and/or surrogate as well as nursing, discussions with consultants, evaluation of patient's response to treatment, examination of patient, obtaining history from patient or surrogate, ordering and performing treatments and interventions, ordering and review of laboratory studies, ordering and review of radiographic studies, pulse oximetry and re-evaluation of patient's condition.     Length of Stay: 3   Ruvim Risko MD 05/02/2017, 5:06 PM  Advanced Heart Failure Team Pager [phone] (M-F; 7a - 4p)  Please contact [facility] for night-coverage after hours (4p -7a ) and weekends on [email or web address]

## 2017-05-02 NOTE — Procedures (Signed)
Extubation Procedure Note  Patient Details:   Name: Jack Joseph DOB: 11-04-1946 MRN: [id number]   Airway Documentation:  Airway 7.5 mm (Active)  Secured at (cm) 24 cm 05/02/2017 11:25 AM  Measured From Lips 05/02/2017 11:25 AM  Secured Location Center 05/02/2017 11:25 AM  Secured By [employer] 05/02/2017 11:25 AM  Tube Holder Repositioned Yes 05/02/2017 11:25 AM  Cuff Pressure (cm H2O) 24 cm H2O 05/02/2017  8:27 AM  Site Condition Dry 05/02/2017 11:25 AM    Evaluation  O2 sats: stable throughout Complications: No apparent complications Patient did tolerate procedure well. Bilateral Breath Sounds: Clear, Diminished   Yes   Patient extubated to [place]. No distress or stridor noted at this time. VSS.   Dannielle Karvonen 05/02/2017, 2:21 PM

## 2017-05-02 NOTE — Progress Notes (Addendum)
Ventilator DC'd at 1415 by MD order and RT, patient placed on 4L [place] resting comfortably, patient worked with incentive spirometer Q15 minutes pulling about 250-400 on IS, frequently checked on patient and upon checking on him at 1600 noticed facial swelling on cheeks, upon palpitation subq air felt throughout neck up to cheeks (previously subq air was only on the right side of his chest), expanded to th left side of his chest and right arm down to his wrist stopping right before his hand. Patient was having a hard time breathing and had difficulty speaking, Dr. Tyrone Sage and Dr. Vassie Loll notified. STAT chest xray obtained, suction changed from 20 to 40 per verbal order from Dr. Tyrone Sage. Dr. Tyrone Sage and Dr. Vassie Loll aware of chest xray results, Dr. Tyrone Sage came to bedside to place a right chest tube (1800) due to a bigger pneumo found on the xray. Will continue to monitor patient. Patient currently resting in bed on a non-re breather, call bell in reach.  Hermina Barters, RN

## 2017-05-03 ENCOUNTER — Inpatient Hospital Stay (HOSPITAL_COMMUNITY): Payer: Medicare Other

## 2017-05-03 DIAGNOSIS — I5043 Acute on chronic combined systolic (congestive) and diastolic (congestive) heart failure: Secondary | ICD-10-CM

## 2017-05-03 DIAGNOSIS — Z09 Encounter for follow-up examination after completed treatment for conditions other than malignant neoplasm: Secondary | ICD-10-CM

## 2017-05-03 DIAGNOSIS — I77819 Aortic ectasia, unspecified site: Secondary | ICD-10-CM

## 2017-05-03 DIAGNOSIS — J9601 Acute respiratory failure with hypoxia: Secondary | ICD-10-CM

## 2017-05-03 LAB — CBC
HCT: 23.9 % — ABNORMAL LOW (ref 39.0–52.0)
Hemoglobin: 8 g/dL — ABNORMAL LOW (ref 13.0–17.0)
MCH: 30.5 pg (ref 26.0–34.0)
MCHC: 33.5 g/dL (ref 30.0–36.0)
MCV: 91.2 fL (ref 78.0–100.0)
Platelets: 153 10*3/uL (ref 150–400)
RBC: 2.62 MIL/uL — ABNORMAL LOW (ref 4.22–5.81)
RDW: 14.9 % (ref 11.5–15.5)
WBC: 9.2 10*3/uL (ref 4.0–10.5)

## 2017-05-03 LAB — BASIC METABOLIC PANEL
Anion gap: 7 (ref 5–15)
BUN: 10 mg/dL (ref 6–20)
CO2: 27 mmol/L (ref 22–32)
Calcium: 7.8 mg/dL — ABNORMAL LOW (ref 8.9–10.3)
Chloride: 97 mmol/L — ABNORMAL LOW (ref 101–111)
Creatinine, Ser: 0.8 mg/dL (ref 0.61–1.24)
GFR calc Af Amer: >60 mL/min (ref >60)
GFR calc non Af Amer: >60 mL/min (ref >60)
Glucose, Bld: 111 mg/dL — ABNORMAL HIGH (ref 65–99)
Potassium: 3.9 mmol/L (ref 3.5–5.1)
Sodium: 131 mmol/L — ABNORMAL LOW (ref 135–145)

## 2017-05-03 LAB — GLUCOSE, CAPILLARY
Glucose-Capillary: 110 mg/dL — ABNORMAL HIGH (ref 65–99)
Glucose-Capillary: 111 mg/dL — ABNORMAL HIGH (ref 65–99)
Glucose-Capillary: 112 mg/dL — ABNORMAL HIGH (ref 65–99)
Glucose-Capillary: 148 mg/dL — ABNORMAL HIGH (ref 65–99)
Glucose-Capillary: 93 mg/dL (ref 65–99)
Glucose-Capillary: 96 mg/dL (ref 65–99)

## 2017-05-03 LAB — POCT I-STAT 3, ART BLOOD GAS (G3+)
Acid-Base Excess: 2 mmol/L (ref 0.0–2.0)
Acid-Base Excess: 5 mmol/L — ABNORMAL HIGH (ref 0.0–2.0)
Bicarbonate: 26 mmol/L (ref 20.0–28.0)
Bicarbonate: 28.8 mmol/L — ABNORMAL HIGH (ref 20.0–28.0)
O2 Saturation: 100 %
O2 Saturation: 91 %
Patient temperature: 98.6
Patient temperature: 99.5
TCO2: 27 mmol/L (ref 0–100)
TCO2: 30 mmol/L (ref 0–100)
pCO2 arterial: 36.3 mmHg (ref 32.0–48.0)
pCO2 arterial: 38.3 mmHg (ref 32.0–48.0)
pH, Arterial: 7.466 — ABNORMAL HIGH (ref 7.350–7.450)
pH, Arterial: 7.484 — ABNORMAL HIGH (ref 7.350–7.450)
pO2, Arterial: 165 mmHg — ABNORMAL HIGH (ref 83.0–108.0)
pO2, Arterial: 60 mmHg — ABNORMAL LOW (ref 83.0–108.0)

## 2017-05-03 LAB — PHOSPHORUS: Phosphorus: 2.9 mg/dL (ref 2.5–4.6)

## 2017-05-03 LAB — COOXEMETRY PANEL
Carboxyhemoglobin: 1.4 % (ref 0.5–1.5)
Methemoglobin: 0.6 % (ref 0.0–1.5)
O2 Saturation: 68.1 %
Total hemoglobin: 7 g/dL — ABNORMAL LOW (ref 12.0–16.0)

## 2017-05-03 LAB — MAGNESIUM
Magnesium: 1.7 mg/dL (ref 1.7–2.4)
Magnesium: 2.3 mg/dL (ref 1.7–2.4)

## 2017-05-03 LAB — PROTIME-INR
INR: 3.49
Prothrombin Time: 35.9 seconds — ABNORMAL HIGH (ref 11.4–15.2)

## 2017-05-03 MED ORDER — VITAL 1.5 CAL PO LIQD
1000.0000 mL | ORAL | Status: DC
  Administered 2017-05-03 – 2017-05-05 (×3): 1000 mL
  Filled 2017-05-03 (×4): qty 1000

## 2017-05-03 MED ORDER — MAGNESIUM SULFATE 4 GM/100ML IV SOLN
4.0000 g | Freq: Once | INTRAVENOUS | Status: AC
  Administered 2017-05-03: 4 g via INTRAVENOUS
  Filled 2017-05-03: qty 100

## 2017-05-03 MED ORDER — PRO-STAT SUGAR FREE PO LIQD
30.0000 mL | Freq: Three times a day (TID) | ORAL | Status: DC
  Administered 2017-05-03 – 2017-05-06 (×8): 30 mL
  Filled 2017-05-03 (×8): qty 30

## 2017-05-03 NOTE — Progress Notes (Signed)
Patient ID: Jack Joseph, male   DOB: February 19, 1946, 71 y.o.   MRN: [id number] TCTS DAILY ICU PROGRESS NOTE                   [address]            [employer] [id number]          [phone]   4 Days Post-Op Procedure(s) (LRB): REDO STERNOTOMY (N/A) REPLACEMENT ASCENDING AORTA/ WHEAT PROCEDURE, HYPOTHERMIC CIRCULATORY ARREST AND CARDIOPULMONARY BYPASS, AND USE OF HEMASHIELD PLATINUM WOVEN DOUBLE VELOUR VASCULAR GRAFT 32 MM X 50 CM (N/A) TRANSESOPHAGEAL ECHOCARDIOGRAM (TEE) (N/A)  Total Length of Stay:  LOS: 4 days   Subjective: pateint plugged left mainstem and required reintubation, now alert neuro intact on vent   Objective: Vital signs in last 24 hours: Temp:  [97.8 F (36.6 C)-99.6 F (37.6 C)] 99.6 F (37.6 C) (06/22 0359) Pulse Rate:  [70-106] 89 (06/22 0700) Cardiac Rhythm: Atrial paced (06/22 0600) Resp:  [12-44] 20 (06/22 0700) BP: (84-146)/(45-68) 99/50 (06/22 0700) SpO2:  [83 %-100 %] 100 % (06/22 0700) Arterial Line BP: (89-157)/(32-61) 110/45 (06/22 0700) FiO2 (%):  [40 %-100 %] 100 % (06/22 0600) Weight:  [153 lb 7 oz (69.6 kg)] 153 lb 7 oz (69.6 kg) (06/22 0500)  Filed Weights   05/01/17 0900 05/02/17 0500 05/03/17 0500  Weight: 160 lb 0.9 oz (72.6 kg) 153 lb (69.4 kg) 153 lb 7 oz (69.6 kg)    Weight change: -6 lb 9.8 oz (-3 kg)   Hemodynamic parameters for last 24 hours: CVP:  [7 mmHg] 7 mmHg  Intake/Output from previous day: 06/21 0701 - 06/22 0700 In: 2113 [I.V.:1623; NG/GT:90; IV Piggyback:400] Out: 1940 [Urine:1780; Chest Tube:160]  Intake/Output this shift: No intake/output data recorded.  Current Meds: Scheduled Meds: . acetaminophen  1,000 mg Oral Q6H   Or  . acetaminophen (TYLENOL) oral liquid 160 mg/5 mL  1,000 mg Per Tube Q6H  . aspirin EC  81 mg Oral Daily   Or  . aspirin  81 mg Per Tube Daily  . bisacodyl  10 mg Oral Daily   Or  . bisacodyl  10 mg Rectal Daily  . chlorhexidine gluconate (MEDLINE KIT)  15 mL  Mouth Rinse BID  . Chlorhexidine Gluconate Cloth  6 each Topical Daily  . docusate sodium  200 mg Oral Daily  . enoxaparin (LOVENOX) injection  30 mg Subcutaneous Q24H  . insulin aspart  0-24 Units Subcutaneous Q4H  . levalbuterol  0.63 mg Nebulization Q6H  . mouth rinse  15 mL Mouth Rinse 10 times per day  . pantoprazole (PROTONIX) IV  40 mg Intravenous Q24H  . sodium chloride flush  10-40 mL Intracatheter Q12H  . sodium chloride flush  10-40 mL Intracatheter Q12H  . sodium chloride flush  3 mL Intravenous Q12H  . warfarin  2.5 mg Per NG tube q1800  . Warfarin - Physician Dosing Inpatient   Does not apply q1800   Continuous Infusions: . sodium chloride Stopped (04/30/17 0900)  . sodium chloride 250 mL (05/01/17 1000)  . sodium chloride Stopped (04/29/17 2000)  . amiodarone 30 mg/hr (05/03/17 0600)  . dexmedetomidine (PRECEDEX) IV infusion Stopped (05/02/17 1136)  . DOPamine 3 mcg/kg/min (05/03/17 0600)  . fentaNYL infusion INTRAVENOUS 75 mcg/hr (05/03/17 0600)  . lactated ringers    . lactated ringers    . lactated ringers 20 mL/hr at 05/03/17 0000  . norepinephrine (LEVOPHED) Adult infusion 9 mcg/min (05/03/17 0600)  PRN Meds:.sodium chloride, lactated ringers, midazolam, morphine injection, ondansetron (ZOFRAN) IV, sodium chloride flush, sodium chloride flush, sodium chloride flush  General appearance: alert, cooperative and on vent  Neurologic: intact Heart: regular rate and rhythm, S1, S2 normal, no murmur, click, rub or gallop and valve click Lungs: diminished breath sounds bilaterally Abdomen: soft, non-tender; bowel sounds normal; no masses,  no organomegaly Extremities: extremities normal, atraumatic, no cyanosis or edema and Homans sign is negative, no sign of DVT Wound: sternum intact  Lab Results: CBC: Recent Labs  05/02/17 0047 05/03/17 0429  WBC 11.9* 9.2  HGB 8.8* 8.0*  HCT 25.1* 23.9*  PLT 115* 153   BMET:  Recent Labs  05/02/17 0047 05/03/17 0429    NA 131* 131*  K 3.8 3.9  CL 97* 97*  CO2 25 27  GLUCOSE 148* 111*  BUN 11 10  CREATININE 0.94 0.80  CALCIUM 7.7* 7.8*    CMET: Lab Results  Component Value Date   WBC 9.2 05/03/2017   HGB 8.0 (L) 05/03/2017   HCT 23.9 (L) 05/03/2017   PLT 153 05/03/2017   GLUCOSE 111 (H) 05/03/2017   CHOL 199 01/22/2017   TRIG 71 01/22/2017   HDL 64 01/22/2017   LDLCALC 121 (H) 01/22/2017   ALT 20 05/02/2017   AST 36 05/02/2017   NA 131 (L) 05/03/2017   K 3.9 05/03/2017   CL 97 (L) 05/03/2017   CREATININE 0.80 05/03/2017   BUN 10 05/03/2017   CO2 27 05/03/2017   TSH 5.390 (H) 01/22/2017   INR 1.63 05/02/2017   HGBA1C 5.7 (H) 04/25/2017      PT/INR:  Recent Labs  05/02/17 0047  LABPROT 19.5*  INR 1.63   Radiology: Dg Chest Port 1 View  Result Date: 05/02/2017 CLINICAL DATA:  Endotracheal tube placement.  Initial encounter. EXAM: PORTABLE CHEST 1 VIEW COMPARISON:  Chest radiograph performed earlier today at 6:55 p.m. FINDINGS: The patient's endotracheal tube is seen ending 2 cm above the carina. There is new near complete opacification of the left hemithorax, reflecting a large left-sided pleural effusion and associated airspace disease. Right perihilar airspace opacity is also new from the prior study. A trace right-sided pneumothorax is again seen, with 2 right-sided chest tubes again noted. The chest tubes have been readjusted since the prior study. The cardiomediastinal silhouette is not well assessed due to opacification of the left hemithorax. A valve replacement is noted. The patient is status post median sternotomy. No acute osseous abnormalities are seen. Diffuse soft tissue air is noted along the right chest wall and neck. IMPRESSION: 1. Endotracheal tube seen ending 2 cm above the carina. 2. New near complete opacification of the left hemithorax, reflecting a large left-sided pleural effusion and associated airspace disease. Right perihilar airspace opacity is also new from the  prior study. This is concerning for pneumonia. 3. Trace right-sided pneumothorax again noted, with 2 right-sided chest tubes again seen. 4. Diffuse soft tissue air along the right chest wall and neck. Electronically Signed   By: Garald Balding M.D.   On: 05/02/2017 23:59   Dg Chest Port 1 View  Result Date: 05/02/2017 CLINICAL DATA:  Chest tube placement. EXAM: PORTABLE CHEST 1 VIEW COMPARISON:  Chest radiograph June 21st 1,018 at 1606 hours FINDINGS: Interval placement of RIGHT pigtail chest tube, retaining loop projecting along apex. Large bore RIGHT chest tube with side port projecting within the chest wall is unchanged. Trace residual basilar pneumothorax suspected though limited by extensive subcutaneous gas. RIGHT PICC  distal tip projects at cavoatrial junction. Mild cardiomegaly, status post median sternotomy. No pleural effusion. Multiple acute RIGHT rib fractures again noted. IMPRESSION: New RIGHT chest tube with trace basilar residual pneumothorax. Stable remaining life support lines. Electronically Signed   By: Elon Alas M.D.   On: 05/02/2017 19:13   Dg Chest Port 1 View  Result Date: 05/02/2017 CLINICAL DATA:  Chest asymmetry right-greater-than-left EXAM: PORTABLE CHEST 1 VIEW COMPARISON:  Portable chest x-ray of May 02, 2017 FINDINGS: There is a very large amount of subcutaneous emphysema over the right chest, axillary region, and the base of the neck on the right with a small amount of subcutaneous emphysema at the base of the neck on the left. There is a pneumothorax on the right the tip pleural line visible along the lower lateral thoracic wall. It is difficult to demonstrate a pleural line more superiorly. The tip and proximal port of the left chest tube overlie the posterior aspects of the right ninth and eighth ribs respectively. There is no mediastinal shift. There is increased density in the left lower lobe which is stable. The mid and upper left lung are clear. The heart is  top-normal in size. The pulmonary vascularity is normal. There is soft tissue fullness in the left hilar region which is stable. The sternal wires are intact. There is a prosthetic aortic valve. The right-sided PICC line tip projects over the midportion of the SVC. IMPRESSION: Extensive subcutaneous emphysema on the right likely is responsible for the asymmetry of the thorax. A poorly defined at least 20% right-sided pneumothorax is present and more conspicuous than on the previous study. The right chest tube is in position as described. Persistent left lower lobe atelectasis or pneumonia. Electronically Signed   By: David  Martinique M.D.   On: 05/02/2017 16:25     Assessment/Plan: S/P Procedure(s) (LRB): REDO STERNOTOMY (N/A) REPLACEMENT ASCENDING AORTA/ WHEAT PROCEDURE, HYPOTHERMIC CIRCULATORY ARREST AND CARDIOPULMONARY BYPASS, AND USE OF HEMASHIELD PLATINUM WOVEN DOUBLE VELOUR VASCULAR GRAFT 32 MM X 50 CM (N/A) TRANSESOPHAGEAL ECHOCARDIOGRAM (TEE) (N/A) Diuresis On levophed,  Low dose dopamine , good uop and renal function stable On coumadin for mechanical valve , pt pending  Leave chest tubes for now  Needs nutrition     [name] 05/03/2017 7:26 AM

## 2017-05-03 NOTE — Progress Notes (Signed)
Advanced Heart Failure Rounding Note   Subjective:    Yesterday extubated but re-intubated last night. Remains on norepi 9 mcg + dopamine 3 mcg. Unable to wean norepi.   Co-ox 68%.  CVP 1.   Objective:   Weight Range:  Vital Signs:   Temp:  [97.8 F (36.6 C)-99.6 F (37.6 C)] 99.6 F (37.6 C) (06/22 0359) Pulse Rate:  [70-106] 89 (06/22 0700) Resp:  [12-44] 20 (06/22 0700) BP: (84-146)/(45-68) 99/50 (06/22 0700) SpO2:  [83 %-100 %] 100 % (06/22 0700) Arterial Line BP: (89-157)/(32-61) 110/45 (06/22 0700) FiO2 (%):  [40 %-100 %] 100 % (06/22 0600) Weight:  [153 lb 7 oz (69.6 kg)] 153 lb 7 oz (69.6 kg) (06/22 0500) Last BM Date: 04/28/17  Weight change: Filed Weights   05/01/17 0900 05/02/17 0500 05/03/17 0500  Weight: 160 lb 0.9 oz (72.6 kg) 153 lb (69.4 kg) 153 lb 7 oz (69.6 kg)    Intake/Output:   Intake/Output Summary (Last 24 hours) at 05/03/17 0734 Last data filed at 05/03/17 0600  Gross per 24 hour  Intake          2112.97 ml  Output             1940 ml  Net           172.97 ml     Physical Exam: CVP 1  General:  Awake on vent  HEENT: Crepitus neck . ETT  Neck: supple. no JVD. Carotids 2+ bilat; no bruits. No lymphadenopathy or thryomegaly appreciated. Cor: PMI nondisplaced. RRR, Mechanical S2.  Lungs: Decreased on the right. CT x2 on right Abdomen: soft, nontender, nondistended. No hepatosplenomegaly. No bruits or masses. Good bowel sounds. Extremities: no cyanosis, clubbing, R and LLE SCDs. Crepitus RUE Neuro: intubated Awake on vent.    Telemetry: NSR with NSVT yesterday. None over night.    Labs: Basic Metabolic Panel:  Recent Labs Lab 04/30/17 0351 04/30/17 1640 04/30/17 1657 05/01/17 0115 05/01/17 0737 05/02/17 0047 05/02/17 0200 05/03/17 0429  NA 135  --  136 134*  --  131*  --  131*  K 4.3  --  4.2 3.7  --  3.8  --  3.9  CL 106  --  100* 101  --  97*  --  97*  CO2 24  --   --  22  --  25  --  27  GLUCOSE 143*  --  122* 179*   --  148*  --  111*  BUN 12  --  11 12  --  11  --  10  CREATININE 1.05 1.05 0.90 1.31*  --  0.94  --  0.80  CALCIUM 8.4*  --   --  8.0*  --  7.7*  --  7.8*  MG 2.1 1.9  --  1.8 2.3  --  1.7 1.7    Liver Function Tests:  Recent Labs Lab 05/01/17 0115 05/02/17 0047  AST 42* 36  ALT 18 20  ALKPHOS 30* 37*  BILITOT 0.9 0.7  PROT 5.1* 5.0*  ALBUMIN 3.3* 3.0*   No results for input(s): LIPASE, AMYLASE in the last 168 hours. No results for input(s): AMMONIA in the last 168 hours.  CBC:  Recent Labs Lab 04/30/17 0351 04/30/17 1640 04/30/17 1657 05/01/17 0115 05/02/17 0047 05/03/17 0429  WBC 12.8* 12.0*  --  16.1* 11.9* 9.2  HGB 8.3* 8.3* 8.2* 8.5* 8.8* 8.0*  HCT 24.8* 24.7* 24.0* 25.6* 25.1* 23.9*  MCV 91.9 92.2  --  93.1 89.3 91.2  PLT 115* 102*  --  109* 115* 153    Cardiac Enzymes:  Recent Labs Lab 05/01/17 0344 05/01/17 0737 05/01/17 1314 05/01/17 2005  CKTOTAL 474*  --   --   --   CKMB 16.7*  --   --   --   TROPONINI  --  0.46* 0.44* 0.43*    BNP: BNP (last 3 results) No results for input(s): BNP in the last 8760 hours.  ProBNP (last 3 results) No results for input(s): PROBNP in the last 8760 hours.    Other results:  Imaging: Dg Chest Port 1 View  Result Date: 05/02/2017 CLINICAL DATA:  Endotracheal tube placement.  Initial encounter. EXAM: PORTABLE CHEST 1 VIEW COMPARISON:  Chest radiograph performed earlier today at 6:55 p.m. FINDINGS: The patient's endotracheal tube is seen ending 2 cm above the carina. There is new near complete opacification of the left hemithorax, reflecting a large left-sided pleural effusion and associated airspace disease. Right perihilar airspace opacity is also new from the prior study. A trace right-sided pneumothorax is again seen, with 2 right-sided chest tubes again noted. The chest tubes have been readjusted since the prior study. The cardiomediastinal silhouette is not well assessed due to opacification of the left  hemithorax. A valve replacement is noted. The patient is status post median sternotomy. No acute osseous abnormalities are seen. Diffuse soft tissue air is noted along the right chest wall and neck. IMPRESSION: 1. Endotracheal tube seen ending 2 cm above the carina. 2. New near complete opacification of the left hemithorax, reflecting a large left-sided pleural effusion and associated airspace disease. Right perihilar airspace opacity is also new from the prior study. This is concerning for pneumonia. 3. Trace right-sided pneumothorax again noted, with 2 right-sided chest tubes again seen. 4. Diffuse soft tissue air along the right chest wall and neck. Electronically Signed   By: Garald Balding M.D.   On: 05/02/2017 23:59   Dg Chest Port 1 View  Result Date: 05/02/2017 CLINICAL DATA:  Chest tube placement. EXAM: PORTABLE CHEST 1 VIEW COMPARISON:  Chest radiograph June 21st 1,018 at 1606 hours FINDINGS: Interval placement of RIGHT pigtail chest tube, retaining loop projecting along apex. Large bore RIGHT chest tube with side port projecting within the chest wall is unchanged. Trace residual basilar pneumothorax suspected though limited by extensive subcutaneous gas. RIGHT PICC distal tip projects at cavoatrial junction. Mild cardiomegaly, status post median sternotomy. No pleural effusion. Multiple acute RIGHT rib fractures again noted. IMPRESSION: New RIGHT chest tube with trace basilar residual pneumothorax. Stable remaining life support lines. Electronically Signed   By: Elon Alas M.D.   On: 05/02/2017 19:13   Dg Chest Port 1 View  Result Date: 05/02/2017 CLINICAL DATA:  Chest asymmetry right-greater-than-left EXAM: PORTABLE CHEST 1 VIEW COMPARISON:  Portable chest x-ray of May 02, 2017 FINDINGS: There is a very large amount of subcutaneous emphysema over the right chest, axillary region, and the base of the neck on the right with a small amount of subcutaneous emphysema at the base of the neck on  the left. There is a pneumothorax on the right the tip pleural line visible along the lower lateral thoracic wall. It is difficult to demonstrate a pleural line more superiorly. The tip and proximal port of the left chest tube overlie the posterior aspects of the right ninth and eighth ribs respectively. There is no mediastinal shift. There is increased density in the left lower lobe which is stable. The mid  and upper left lung are clear. The heart is top-normal in size. The pulmonary vascularity is normal. There is soft tissue fullness in the left hilar region which is stable. The sternal wires are intact. There is a prosthetic aortic valve. The right-sided PICC line tip projects over the midportion of the SVC. IMPRESSION: Extensive subcutaneous emphysema on the right likely is responsible for the asymmetry of the thorax. A poorly defined at least 20% right-sided pneumothorax is present and more conspicuous than on the previous study. The right chest tube is in position as described. Persistent left lower lobe atelectasis or pneumonia. Electronically Signed   By: David  Martinique M.D.   On: 05/02/2017 16:25   Dg Chest Port 1 View  Result Date: 05/02/2017 CLINICAL DATA:  Chest tube. EXAM: PORTABLE CHEST 1 VIEW COMPARISON:  05/01/2017. FINDINGS: Interim placement right PICC line, its tip is at the cavoatrial junction. Interim removal of right IJ sheath. Endotracheal tube and NG tube in stable position. Right chest tube in stable position. Miniscule residual right apical pneumothorax cannot be excluded on today's exam. This is improved from prior initial study of 05/01/2017. Prior CABG. Prior cardiac valve replacement. Stable cardiomegaly. Atelectasis/consolidation left lung base. Small left pleural effusion cannot be excluded. Right chest wall subcutaneous emphysema . IMPRESSION: 1. Interim placement right PICC line. Its tip is at the cavoatrial junction. Interim removal of right IJ sheath. Endotracheal tube, NG  tube, right chest tube in stable position. Miniscule residual right apical pneumothorax cannot be excluded . 2.  Atelectasis/ consolidation left lung base. 3.  Prior CABG and cardiac valve replacement.  Stable cardiomegaly. Electronically Signed   By: Marcello Moores  Register   On: 05/02/2017 07:34     Medications:     Scheduled Medications: . acetaminophen  1,000 mg Oral Q6H   Or  . acetaminophen (TYLENOL) oral liquid 160 mg/5 mL  1,000 mg Per Tube Q6H  . aspirin EC  81 mg Oral Daily   Or  . aspirin  81 mg Per Tube Daily  . bisacodyl  10 mg Oral Daily   Or  . bisacodyl  10 mg Rectal Daily  . chlorhexidine gluconate (MEDLINE KIT)  15 mL Mouth Rinse BID  . Chlorhexidine Gluconate Cloth  6 each Topical Daily  . docusate sodium  200 mg Oral Daily  . enoxaparin (LOVENOX) injection  30 mg Subcutaneous Q24H  . insulin aspart  0-24 Units Subcutaneous Q4H  . levalbuterol  0.63 mg Nebulization Q6H  . mouth rinse  15 mL Mouth Rinse 10 times per day  . pantoprazole (PROTONIX) IV  40 mg Intravenous Q24H  . sodium chloride flush  10-40 mL Intracatheter Q12H  . sodium chloride flush  10-40 mL Intracatheter Q12H  . sodium chloride flush  3 mL Intravenous Q12H  . warfarin  2.5 mg Per NG tube q1800  . Warfarin - Physician Dosing Inpatient   Does not apply q1800    Infusions: . sodium chloride Stopped (04/30/17 0900)  . sodium chloride 250 mL (05/01/17 1000)  . sodium chloride Stopped (04/29/17 2000)  . amiodarone 30 mg/hr (05/03/17 0600)  . dexmedetomidine (PRECEDEX) IV infusion Stopped (05/02/17 1136)  . DOPamine 3 mcg/kg/min (05/03/17 0600)  . fentaNYL infusion INTRAVENOUS 75 mcg/hr (05/03/17 0600)  . lactated ringers    . lactated ringers    . lactated ringers 20 mL/hr at 05/03/17 0000  . norepinephrine (LEVOPHED) Adult infusion 9 mcg/min (05/03/17 0600)    PRN Medications: sodium chloride, lactated ringers, midazolam, morphine injection,  ondansetron (ZOFRAN) IV, sodium chloride flush,  sodium chloride flush, sodium chloride flush   Assessment:   1. VT arrest  - EP folllowing. Continue IV amio. Supp K and Mag. Goal K > 4.0 and Mg > 2.0.  - Plan ICD prior to d/c 2. Acute on chronic systolic CHF - EF now down to 10-15% from 25-30% pre-op. Suspect acute drop related to recent cardiac surgery, bypass, and arrest. Plan to repeat ECHO next week.   -  Remains on dopamine 3 mcg + norepi 9 mcg.  Good co-ox at 68%.  Wean pressors as able.   - Volume status stable. CVP 1, no diuresis.  - No BB with shock s/p arrest. - No room for ACE/ARB/ARNI/spiro.  3. 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  4. Acute hypoxic respiratory failure - Extubated yesterday but re-intubated last night.  - He has severe COPD. 5. Non-obstructive CAD - Cath 03/05/17 with nonobstructive CAD.  - Continue medical therapy.  6. AKI - Mild. Likely in setting of arrest. Now resolved  7. R PTX - R apical pneumo with chest tube x2 in place. TCTS managing  8. 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 INR pending.  - Discussed with PharmD  Length of Stay: 4   Amy Clegg NP-C  05/03/2017, 7:34 AM  Advanced Heart Failure Team Pager [phone] (M-F; [place])  Please contact [facility] for night-coverage after hours (4p -7a ) and weekends on [email or web address]  Patient seen with NP, agree with the above note.  He is intubated and stable this morning.  CVP 1 and co-ox 68%.  Wean dopamine and norepinephrine as able today. No diuresis.   Severe COPD, remains intubated.  Continue to wean from vent.   Loralie Champagne 05/03/2017 8:49 AM

## 2017-05-03 NOTE — Progress Notes (Signed)
RT NOTE:  Per Dr. Dorris Fetch verbal order to do Recruitment through vent "frequently" RT will put in order for Q2 hours as patient tolerates.    5956 : Recruitment maneuver done per Dr. Dorris Fetch. Pt remained hemodynamically stable throughout recruitment. Post BP 112/45. Pt tolerated well.

## 2017-05-03 NOTE — Progress Notes (Signed)
Initial Nutrition Assessment  INTERVENTION:   Vital 1.5 @ 40 ml/hr (960 ml/day) 30 ml Prostat TID Provides: 1740 kcal, 109 grams protein, and 733 ml free water.    NUTRITION DIAGNOSIS:   Increased nutrient needs related to wound healing as evidenced by estimated needs.  GOAL:   Patient will meet greater than or equal to 90% of their needs  MONITOR:   TF tolerance, I & O's, Labs  REASON FOR ASSESSMENT:   Consult, Ventilator Enteral/tube feeding initiation and management  ASSESSMENT:   Pt with PMH of CHF, severe COPD, AVD, chronic anticoagulation, hyperlipidemia admitted with dilated ascending aorta s/p valve replacement 6/18.  Arrest 6/20.    Pt extubated 6/21 but re-intubated 6/22 after plugging L mainstem. Cortrak tube placed.  R pneumothorax after CPR, had 2 chest tubes placed MV: 9.9 L/min Temp (24hrs), Avg:99 F (37.2 C), Min:98.3 F (36.8 C), Max:99.6 F (37.6 C)  Medications reviewed and include: dulcolax, colace, norepinephrine Labs reviewed: Na 131 (L) Chest tubes: 160 ml out Nutrition-Focused physical exam completed. Findings are no fat depletion, no muscle depletion, and no edema.    Diet Order:  Diet NPO time specified  Skin:  Reviewed, no issues  Last BM:  6/17  Height:   Ht Readings from Last 1 Encounters:  05/02/17 5\' 10"  (1.778 m)    Weight:   Wt Readings from Last 1 Encounters:  05/03/17 153 lb 7 oz (69.6 kg)    Ideal Body Weight:  75.4 kg  BMI:  Body mass index is 22.02 kg/m.  Estimated Nutritional Needs:   Kcal:  1773  Protein:  100-115 grams  Fluid:  > 1.7 L/day  EDUCATION NEEDS:   No education needs identified at this time  Kendell Bane RD, LDN, CNSC [phone] Pager [phone] After Hours Pager

## 2017-05-03 NOTE — Progress Notes (Addendum)
2300 Pola Corn MD called to camera in on patient for desat episode with O2 sat at 87% despite efforts to help increase. Patient using accessory muscles to breathe.   2305 - MD Tyson Alias at bedside to place ETT. MD Dorris Fetch notified over the phone and updated on patient situation.   0020 - MD Dorris Fetch called to look at chest x-ray that was performed after intubation and I read the radiologists results to him. Orders received to suction patient and have RT perform frequent recruitments q2 hour PRN as tolerated. Will continue to monitor patient.  Horton Chin, RN   Patients family was updated on status and patients daughter came to bedside shortly after intubation.

## 2017-05-03 NOTE — Progress Notes (Signed)
CT surgery p.m. Rounds  Resting on ventilator Blood pressure less than 100 systolic Increased dopamine back to 3 mcg/kg/m

## 2017-05-03 NOTE — Progress Notes (Signed)
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  Underwent ascending aortic replacement/wheat procedure with CABG.  Was progressing well post op and ambulating in hall.  However just after midnight 6/20 had VT arrest with CPR and shock x 3.  Intubated during ACLS, small R ptx noted post CPR and CT placed by CVTS.  PCCM consulted for vent management.   SUBJECTIVE:  Attempt at extubation on 6/21 but he required reintubation later same night Some increased subcutaneous air so an additional right sided chest tube placed 6/21, then he developed complete left sided atelectasis likely due to respiratory muscle weakness and poor secretion clearance  VITAL SIGNS: BP (!) 82/50   Pulse 90   Temp 98.6 F (37 C) (Oral)   Resp 20   Ht 5\' 10"  (1.778 m)   Wt 69.6 kg (153 lb 7 oz)   SpO2 100%   BMI 22.02 kg/m   HEMODYNAMICS: CVP:  [0 mmHg] 0 mmHg  VENTILATOR SETTINGS: Vent Mode: PRVC FiO2 (%):  [40 %-100 %] 100 % Set Rate:  [20 bmp] 20 bmp Vt Set:  [580 mL] 580 mL PEEP:  [3 cmH20-5 cmH20] 5 cmH20 Plateau Pressure:  [19 cmH20-24 cmH20] 21 cmH20  INTAKE / OUTPUT: I/O last 3 completed shifts: In: 3160.5 [I.V.:2670.5; NG/GT:90; IV Piggyback:400] Out: 3985 [Urine:3775; Chest Tube:210]  PHYSICAL EXAMINATION: General:  Ill-appearing man, on mechanical ventilation Neuro:  Awake with eyes open, nods to questions, follows instructions, globally weak, weak cough HEENT:  Endotracheal tube in place, oropharynx clear Cardiovascular:  Regular, distant, no rub, sternotomy  clean and dry and intact Lungs:  Bilateral scattered rhonchi decreased breath sounds on the left, right sided chest and neck subcutaneous air Abdomen: Soft, nontender, hypoactive bowel sounds Musculoskeletal:  No deformity  LABS:  BMET  Recent Labs Lab 05/01/17 0115 05/02/17 0047 05/03/17 0429  NA 134* 131* 131*  K 3.7 3.8 3.9  CL 101 97* 97*  CO2 22 25 27   BUN 12 11 10   CREATININE 1.31* 0.94 0.80  GLUCOSE 179* 148* 111*    Electrolytes  Recent Labs Lab 05/01/17 0115 05/01/17 0737 05/02/17 0047 05/02/17 0200 05/03/17 0429  CALCIUM 8.0*  --  7.7*  --  7.8*  MG 1.8 2.3  --  1.7 1.7    CBC  Recent Labs Lab 05/01/17 0115 05/02/17 0047 05/03/17 0429  WBC 16.1* 11.9* 9.2  HGB 8.5* 8.8* 8.0*  HCT 25.6* 25.1* 23.9*  PLT 109* 115* 153    Coag's  Recent Labs Lab 04/29/17 1502 05/01/17 0737 05/02/17 0047 05/03/17 0725  APTT 34  --   --   --   INR 1.54 1.50 1.63 3.49    Sepsis Markers  Recent Labs Lab 05/01/17 0755  LATICACIDVEN 1.2    ABG  Recent Labs Lab 05/01/17 0121 05/01/17 1044 05/03/17 0007  PHART 7.266* 7.514* 7.466*  PCO2ART 53.7* 34.1 36.3  PO2ART 144.0* 211.0* 60.0*    Liver Enzymes  Recent Labs Lab 05/01/17 0115 05/02/17 0047  AST 42* 36  ALT 18 20  ALKPHOS 30* 37*  BILITOT  0.9 0.7  ALBUMIN 3.3* 3.0*    Cardiac Enzymes  Recent Labs Lab 05/01/17 0737 05/01/17 1314 05/01/17 2005  TROPONINI 0.46* 0.44* 0.43*    Glucose  Recent Labs Lab 05/02/17 1658 05/02/17 1935 05/02/17 2358 05/03/17 0340 05/03/17 0733 05/03/17 1119  GLUCAP 122* 96 112* 93 96 111*    Imaging Dg Chest Port 1 View  Result Date: 05/03/2017 CLINICAL DATA:  Intubation . EXAM: PORTABLE CHEST 1 VIEW COMPARISON:  05/02/2017 . FINDINGS: Endotracheal tube, NG tube, right PICC line, right chest tubes in stable position. Minimal residual right sided pneumothorax. Slight improvement aeration of left lung. Persistent consolidation and atelectasis  and persistent left-sided pleural effusion noted. Pleural effusion has improved from prior exam. Prior median sternotomy and CABG. Prior cardiac valve replacement. Cardiomegaly. No pulmonary venous congestion. Diffuse right chest wall subcutaneous emphysema again noted. IMPRESSION: 1. Lines and tubes including 2 right chest tubes in stable position. Minimal residual right-sided pneumothorax. Diffuse right chest wall subcutaneous emphysema again noted. 2. Slight improvement of aeration of left lung. Persistent consolidation and atelectasis and persistent left-sided pleural effusion noted. Pleural effusion has improved from prior exam . 3. Prior CABG. Prior cardiac valve replacement. Cardiomegaly. No pulmonary venous congestion . Electronically Signed   By: Maisie Fus  Register   On: 05/03/2017 07:51   Dg Chest Port 1 View  Result Date: 05/02/2017 CLINICAL DATA:  Endotracheal tube placement.  Initial encounter. EXAM: PORTABLE CHEST 1 VIEW COMPARISON:  Chest radiograph performed earlier today at 6:55 p.m. FINDINGS: The patient's endotracheal tube is seen ending 2 cm above the carina. There is new near complete opacification of the left hemithorax, reflecting a large left-sided pleural effusion and associated airspace disease. Right perihilar airspace opacity is also new from the prior study. A trace right-sided pneumothorax is again seen, with 2 right-sided chest tubes again noted. The chest tubes have been readjusted since the prior study. The cardiomediastinal silhouette is not well assessed due to opacification of the left hemithorax. A valve replacement is noted. The patient is status post median sternotomy. No acute osseous abnormalities are seen. Diffuse soft tissue air is noted along the right chest wall and neck. IMPRESSION: 1. Endotracheal tube seen ending 2 cm above the carina. 2. New near complete opacification of the left hemithorax, reflecting a large left-sided pleural effusion and associated airspace  disease. Right perihilar airspace opacity is also new from the prior study. This is concerning for pneumonia. 3. Trace right-sided pneumothorax again noted, with 2 right-sided chest tubes again seen. 4. Diffuse soft tissue air along the right chest wall and neck. Electronically Signed   By: Roanna Raider M.D.   On: 05/02/2017 23:59   Dg Chest Port 1 View  Result Date: 05/02/2017 CLINICAL DATA:  Chest tube placement. EXAM: PORTABLE CHEST 1 VIEW COMPARISON:  Chest radiograph June 21st 1,018 at 1606 hours FINDINGS: Interval placement of RIGHT pigtail chest tube, retaining loop projecting along apex. Large bore RIGHT chest tube with side port projecting within the chest wall is unchanged. Trace residual basilar pneumothorax suspected though limited by extensive subcutaneous gas. RIGHT PICC distal tip projects at cavoatrial junction. Mild cardiomegaly, status post median sternotomy. No pleural effusion. Multiple acute RIGHT rib fractures again noted. IMPRESSION: New RIGHT chest tube with trace basilar residual pneumothorax. Stable remaining life support lines. Electronically Signed   By: Awilda Metro M.D.   On: 05/02/2017 19:13   Dg Chest Port 1 View  Result Date: 05/02/2017 CLINICAL DATA:  Chest asymmetry right-greater-than-left EXAM: PORTABLE  CHEST 1 VIEW COMPARISON:  Portable chest x-ray of May 02, 2017 FINDINGS: There is a very large amount of subcutaneous emphysema over the right chest, axillary region, and the base of the neck on the right with a small amount of subcutaneous emphysema at the base of the neck on the left. There is a pneumothorax on the right the tip pleural line visible along the lower lateral thoracic wall. It is difficult to demonstrate a pleural line more superiorly. The tip and proximal port of the left chest tube overlie the posterior aspects of the right ninth and eighth ribs respectively. There is no mediastinal shift. There is increased density in the left lower lobe which is  stable. The mid and upper left lung are clear. The heart is top-normal in size. The pulmonary vascularity is normal. There is soft tissue fullness in the left hilar region which is stable. The sternal wires are intact. There is a prosthetic aortic valve. The right-sided PICC line tip projects over the midportion of the SVC. IMPRESSION: Extensive subcutaneous emphysema on the right likely is responsible for the asymmetry of the thorax. A poorly defined at least 20% right-sided pneumothorax is present and more conspicuous than on the previous study. The right chest tube is in position as described. Persistent left lower lobe atelectasis or pneumonia. Electronically Signed   By: David  [place] M.D.   On: 05/02/2017 16:25     STUDIES:  2D echo 6/20>>> - Left ventricle: The cavity size was normal. Wall thickness was   normal. Systolic function was severely reduced. The estimated   ejection fraction was in the range of 10% to 15%. Doppler   parameters are consistent with abnormal left ventricular   relaxation (grade 1 diastolic dysfunction). - Aortic valve: A mechanical prosthesis was present. - Mitral valve: There was moderate regurgitation. - Left atrium: The atrium was mildly dilated. - Right ventricle: Systolic function was severely reduced. - Right atrium: The atrium was mildly dilated. - Tricuspid valve: There was mild-moderate regurgitation. - Pericardium, extracardiac: There was a left pleural effusion.  CULTURES:   ANTIBIOTICS:   SIGNIFICANT EVENTS: 6/20  LINES/TUBES: ETT 6/20>>> 6/21; 6/21 >>  R chest tube #1 6/20>>> R chest tube #2 6/21 >>   DISCUSSION: 71yo male with VT arrest POD#4 thoracic aortic aneurysm repair.   ASSESSMENT / PLAN:  PULMONARY Acute respiratory failure - post VT arrest  Hx COPD  Right pneumothorax (after CPR) status post chest tube 2 Severe left-sided atelectasis, poor secretion clearance P:   Reintubated 6/21, contributors include resp muscle  weakness and poor secretion clearance with left-sided atelectasis, possible contribution of transient worsening of his right pneumothorax although unclear. Continue current PRVC 8cc/kg. push pulmonary hygiene and chest PT.  Start working on pressure support as his left sided aeration improves Continue scheduled BD  CARDIOVASCULAR VT arrest 6/20 - unclear etiology.  Was not cooled overnight r/t short duration CPR, good mental status post arrest.  Repair thoracic aortic aneurysm 6/18 LV dysfunction - previous EF 30-35%  Hx AVR  Acute on chronic CHF - EF 10-15% Shock - cardiogenic.   P:  Planning for AICD when stable to do so Plan to bolus amiodarone if recurrent VT Currently on dopamine, amiodarone, norepinephrine 9 Other postop management, drip management per TCTS plans Beta blocker on hold Aspirin  RENAL AKI - mild, improved P:   Follow BMP, urine output  GASTROINTESTINAL No active issue  P:   PPI as ordered NG tube placed 6/22 to initiate  tube feeding  HEMATOLOGIC Chronic anticoagulation, mechanical valve P: Currently on Coumadin, consider transition back to heparin drip depending on whether he will be able to take by mouth  INFECTIOUS No active issue  P:   Following clinically off of antibiotics currently  ENDOCRINE Hyperglycemia   P:   Sliding-scale insulin   NEUROLOGIC Sedation needs on vent - mental status appears intact post arrest.  Follows commands per RN and family.  P:   RASS goal:-1 to 0 Currently on fentanyl drip Versed as needed   FAMILY  - Updates:  Wife and son updated at bedside 6/20 and 6/21.   - Inter-disciplinary family meet or Palliative Care meeting due by:  6/27   Independent critical care time is 33 minutes.   Levy Pupa, MD, PhD 05/03/2017, 11:51 AM [facility] [phone] or if no answer [phone]

## 2017-05-03 NOTE — Progress Notes (Signed)
RT NOTE:  Recruitment maneuver done per Dr. Dorris Fetch order. Pt remained hemodynamically stable throughout recruitment. Post BP 118/48. Pt tolerated well.

## 2017-05-03 NOTE — Progress Notes (Signed)
Cortrak Tube Team Note:  Consult received to place a Cortrak feeding tube.   A 10 F Cortrak tube was placed in the R nare and secured with a nasal bridle at 87 cm. Per the Cortrak monitor reading the tube tip is post pyloric.    X-ray ordered due to abnormal Cortrak tracing.Please confirm tube placement before using the Cortrak tube.   If the tube becomes dislodged please keep the tube and contact the Cortrak team at [email or web address] (password [email or web address]) for replacement.  If after hours and replacement cannot be delayed, place a NG tube and confirm placement with an abdominal x-ray.    Kendell Bane RD, LDN, CNSC [phone] Pager [phone] After Hours Pager

## 2017-05-04 ENCOUNTER — Inpatient Hospital Stay (HOSPITAL_COMMUNITY): Payer: Medicare Other

## 2017-05-04 DIAGNOSIS — J9601 Acute respiratory failure with hypoxia: Secondary | ICD-10-CM

## 2017-05-04 LAB — COMPREHENSIVE METABOLIC PANEL
ALT: 18 U/L (ref 17–63)
AST: 22 U/L (ref 15–41)
Albumin: 2.3 g/dL — ABNORMAL LOW (ref 3.5–5.0)
Alkaline Phosphatase: 49 U/L (ref 38–126)
Anion gap: 6 (ref 5–15)
BUN: 14 mg/dL (ref 6–20)
CO2: 27 mmol/L (ref 22–32)
Calcium: 7.5 mg/dL — ABNORMAL LOW (ref 8.9–10.3)
Chloride: 95 mmol/L — ABNORMAL LOW (ref 101–111)
Creatinine, Ser: 0.77 mg/dL (ref 0.61–1.24)
GFR calc Af Amer: >60 mL/min (ref >60)
GFR calc non Af Amer: >60 mL/min (ref >60)
Glucose, Bld: 145 mg/dL — ABNORMAL HIGH (ref 65–99)
Potassium: 3.4 mmol/L — ABNORMAL LOW (ref 3.5–5.1)
Sodium: 128 mmol/L — ABNORMAL LOW (ref 135–145)
Total Bilirubin: 0.8 mg/dL (ref 0.3–1.2)
Total Protein: 4.8 g/dL — ABNORMAL LOW (ref 6.5–8.1)

## 2017-05-04 LAB — PHOSPHORUS: Phosphorus: 2.7 mg/dL (ref 2.5–4.6)

## 2017-05-04 LAB — GLUCOSE, CAPILLARY
Glucose-Capillary: 111 mg/dL — ABNORMAL HIGH (ref 65–99)
Glucose-Capillary: 114 mg/dL — ABNORMAL HIGH (ref 65–99)
Glucose-Capillary: 133 mg/dL — ABNORMAL HIGH (ref 65–99)
Glucose-Capillary: 147 mg/dL — ABNORMAL HIGH (ref 65–99)
Glucose-Capillary: 99 mg/dL (ref 65–99)

## 2017-05-04 LAB — POCT I-STAT, CHEM 8
BUN: 20 mg/dL (ref 6–20)
Calcium, Ion: 1.15 mmol/L (ref 1.15–1.40)
Chloride: 92 mmol/L — ABNORMAL LOW (ref 101–111)
Creatinine, Ser: 0.8 mg/dL (ref 0.61–1.24)
Glucose, Bld: 152 mg/dL — ABNORMAL HIGH (ref 65–99)
HCT: 21 % — ABNORMAL LOW (ref 39.0–52.0)
Hemoglobin: 7.1 g/dL — ABNORMAL LOW (ref 13.0–17.0)
Potassium: 3.8 mmol/L (ref 3.5–5.1)
Sodium: 134 mmol/L — ABNORMAL LOW (ref 135–145)
TCO2: 24 mmol/L (ref 0–100)

## 2017-05-04 LAB — PROTIME-INR
INR: 3.89
Prothrombin Time: 39.1 seconds — ABNORMAL HIGH (ref 11.4–15.2)

## 2017-05-04 LAB — CBC
HCT: 23.2 % — ABNORMAL LOW (ref 39.0–52.0)
Hemoglobin: 7.9 g/dL — ABNORMAL LOW (ref 13.0–17.0)
MCH: 31.2 pg (ref 26.0–34.0)
MCHC: 34.1 g/dL (ref 30.0–36.0)
MCV: 91.7 fL (ref 78.0–100.0)
Platelets: 188 10*3/uL (ref 150–400)
RBC: 2.53 MIL/uL — ABNORMAL LOW (ref 4.22–5.81)
RDW: 14.9 % (ref 11.5–15.5)
WBC: 7.1 10*3/uL (ref 4.0–10.5)

## 2017-05-04 LAB — MAGNESIUM: Magnesium: 2 mg/dL (ref 1.7–2.4)

## 2017-05-04 MED ORDER — FUROSEMIDE 10 MG/ML IJ SOLN
20.0000 mg | Freq: Two times a day (BID) | INTRAMUSCULAR | Status: DC
  Administered 2017-05-04 – 2017-05-05 (×2): 20 mg via INTRAVENOUS
  Filled 2017-05-04 (×2): qty 2

## 2017-05-04 MED ORDER — POTASSIUM CHLORIDE 10 MEQ/50ML IV SOLN
10.0000 meq | INTRAVENOUS | Status: AC
  Administered 2017-05-04 (×3): 10 meq via INTRAVENOUS
  Filled 2017-05-04 (×3): qty 50

## 2017-05-04 MED ORDER — DOCUSATE SODIUM 50 MG/5ML PO LIQD
200.0000 mg | Freq: Every day | ORAL | Status: DC
  Administered 2017-05-04 – 2017-05-12 (×9): 200 mg via ORAL
  Filled 2017-05-04 (×9): qty 20

## 2017-05-04 MED ORDER — SORBITOL 70 % PO SOLN
30.0000 mL | Freq: Every morning | ORAL | Status: AC
  Administered 2017-05-04 – 2017-05-05 (×2): 30 mL via ORAL
  Filled 2017-05-04 (×3): qty 30

## 2017-05-04 MED ORDER — METOCLOPRAMIDE HCL 5 MG/ML IJ SOLN: 10.0000 mg | Freq: Four times a day (QID) | INTRAMUSCULAR | Status: DC

## 2017-05-04 MED ORDER — MAGIC MOUTHWASH
5.0000 mL | Freq: Three times a day (TID) | ORAL | Status: DC
  Administered 2017-05-04 – 2017-05-23 (×42): 5 mL via ORAL
  Filled 2017-05-04 (×42): qty 5

## 2017-05-04 MED ORDER — NOREPINEPHRINE BITARTRATE 1 MG/ML IV SOLN
0.0000 ug/min | INTRAVENOUS | Status: DC
  Administered 2017-05-04: 15 ug/min via INTRAVENOUS
  Administered 2017-05-07: 2 ug/min via INTRAVENOUS
  Administered 2017-05-08: 12 ug/min via INTRAVENOUS
  Administered 2017-05-10: 2.5 ug/min via INTRAVENOUS
  Filled 2017-05-04 (×5): qty 16

## 2017-05-04 MED ORDER — METOCLOPRAMIDE HCL 5 MG/ML IJ SOLN
10.0000 mg | Freq: Four times a day (QID) | INTRAMUSCULAR | Status: DC
  Administered 2017-05-04 – 2017-05-08 (×15): 10 mg via INTRAVENOUS
  Filled 2017-05-04 (×16): qty 2

## 2017-05-04 NOTE — Progress Notes (Signed)
CTS PM Rounds  Did not toerate PS weaning- tachypnea I>>O -- start bid lasix Cont current care

## 2017-05-04 NOTE — Progress Notes (Signed)
Patient ID: Jack Joseph, male   DOB: September 15, 1946, 71 y.o.   MRN: [id number]    Advanced Heart Failure Rounding Note   Subjective:    6/21 extubated but re-intubated that night. Remains on norepi 10 mcg + dopamine 2 mcg.  He is on amiodarone gtt.   No VT.  ECG concerning for atrial flutter this morning, HR in 90s.   CVP 3 today.    CXR with LLL atelectasis.   Objective:   Weight Range:  Vital Signs:   Temp:  [98.5 F (36.9 C)-99.2 F (37.3 C)] 98.9 F (37.2 C) (06/23 0715) Pulse Rate:  [85-94] 89 (06/23 0930) Resp:  [17-25] 20 (06/23 0930) BP: (72-138)/(42-75) 106/54 (06/23 0930) SpO2:  [87 %-100 %] 94 % (06/23 0930) Arterial Line BP: (77-153)/(32-59) 127/47 (06/23 0930) FiO2 (%):  [40 %-100 %] 40 % (06/23 0829) Weight:  [152 lb 12.5 oz (69.3 kg)] 152 lb 12.5 oz (69.3 kg) (06/23 0500) Last BM Date: 04/28/17  Weight change: Filed Weights   05/02/17 0500 05/03/17 0500 05/04/17 0500  Weight: 153 lb (69.4 kg) 153 lb 7 oz (69.6 kg) 152 lb 12.5 oz (69.3 kg)    Intake/Output:   Intake/Output Summary (Last 24 hours) at 05/04/17 1007 Last data filed at 05/04/17 0902  Gross per 24 hour  Intake          2622.42 ml  Output             1510 ml  Net          1112.42 ml     Physical Exam: CVP 3  General:  Awake on vent  HEENT: Crepitus neck . ETT  Neck: supple. no JVD. Carotids 2+ bilat; no bruits. No lymphadenopathy or thryomegaly appreciated. Cor: PMI nondisplaced. RRR, Mechanical S2.  Lungs: Decreased on the right. CT x2 on right Abdomen: soft, nontender, nondistended. No hepatosplenomegaly. No bruits or masses. Good bowel sounds. Extremities: no cyanosis, clubbing, R and LLE SCDs. No edema.  Neuro: intubated Awake on vent.    Telemetry: Personally reviewed, ?atrial flutter.  No VT.   Labs: Basic Metabolic Panel:  Recent Labs Lab 04/30/17 0351  04/30/17 1657 05/01/17 0115 05/01/17 0737 05/02/17 0047 05/02/17 0200 05/03/17 0429 05/03/17 1845  05/04/17 0429  NA 135  --  136 134*  --  131*  --  131*  --  128*  K 4.3  --  4.2 3.7  --  3.8  --  3.9  --  3.4*  CL 106  --  100* 101  --  97*  --  97*  --  95*  CO2 24  --   --  22  --  25  --  27  --  27  GLUCOSE 143*  --  122* 179*  --  148*  --  111*  --  145*  BUN 12  --  11 12  --  11  --  10  --  14  CREATININE 1.05  < > 0.90 1.31*  --  0.94  --  0.80  --  0.77  CALCIUM 8.4*  --   --  8.0*  --  7.7*  --  7.8*  --  7.5*  MG 2.1  < >  --  1.8 2.3  --  1.7 1.7 2.3 2.0  PHOS  --   --   --   --   --   --   --   --  2.9 2.7  < > =  values in this interval not displayed.  Liver Function Tests:  Recent Labs Lab 05/01/17 0115 05/02/17 0047 05/04/17 0429  AST 42* 36 22  ALT '18 20 18  '$ ALKPHOS 30* 37* 49  BILITOT 0.9 0.7 0.8  PROT 5.1* 5.0* 4.8*  ALBUMIN 3.3* 3.0* 2.3*   No results for input(s): LIPASE, AMYLASE in the last 168 hours. No results for input(s): AMMONIA in the last 168 hours.  CBC:  Recent Labs Lab 04/30/17 1640 04/30/17 1657 05/01/17 0115 05/02/17 0047 05/03/17 0429 05/04/17 0429  WBC 12.0*  --  16.1* 11.9* 9.2 7.1  HGB 8.3* 8.2* 8.5* 8.8* 8.0* 7.9*  HCT 24.7* 24.0* 25.6* 25.1* 23.9* 23.2*  MCV 92.2  --  93.1 89.3 91.2 91.7  PLT 102*  --  109* 115* 153 188    Cardiac Enzymes:  Recent Labs Lab 05/01/17 0344 05/01/17 0737 05/01/17 1314 05/01/17 2005  CKTOTAL 474*  --   --   --   CKMB 16.7*  --   --   --   TROPONINI  --  0.46* 0.44* 0.43*    BNP: BNP (last 3 results) No results for input(s): BNP in the last 8760 hours.  ProBNP (last 3 results) No results for input(s): PROBNP in the last 8760 hours.    Other results:  Imaging: Dg Chest Port 1 View  Result Date: 05/04/2017 CLINICAL DATA:  Chest tube. EXAM: PORTABLE CHEST 1 VIEW COMPARISON:  05/03/2017 FINDINGS: Endotracheal tube terminates approximately 4.5 cm above the carina. Enteric tube courses into the left upper abdomen with tip not imaged. Two right-sided pleural catheters remain  in place. A right PICC terminates over the lower SVC. Prior aortic valve replacement is noted. The cardiomediastinal silhouette is unchanged. There is a small to moderate size left pleural effusion which has decreased from the prior study with improved aeration of the left lung. No definite pneumothorax is identified. Subcutaneous emphysema remains in the right chest wall and neck. IMPRESSION: 1. Decreased size of left pleural effusion with improved left lung aeration. 2. Support devices as above.  No definite pneumothorax. Electronically Signed   By: Logan Bores M.D.   On: 05/04/2017 07:25   Dg Chest Port 1 View  Result Date: 05/03/2017 CLINICAL DATA:  Intubation . EXAM: PORTABLE CHEST 1 VIEW COMPARISON:  05/02/2017 . FINDINGS: Endotracheal tube, NG tube, right PICC line, right chest tubes in stable position. Minimal residual right sided pneumothorax. Slight improvement aeration of left lung. Persistent consolidation and atelectasis and persistent left-sided pleural effusion noted. Pleural effusion has improved from prior exam. Prior median sternotomy and CABG. Prior cardiac valve replacement. Cardiomegaly. No pulmonary venous congestion. Diffuse right chest wall subcutaneous emphysema again noted. IMPRESSION: 1. Lines and tubes including 2 right chest tubes in stable position. Minimal residual right-sided pneumothorax. Diffuse right chest wall subcutaneous emphysema again noted. 2. Slight improvement of aeration of left lung. Persistent consolidation and atelectasis and persistent left-sided pleural effusion noted. Pleural effusion has improved from prior exam . 3. Prior CABG. Prior cardiac valve replacement. Cardiomegaly. No pulmonary venous congestion . Electronically Signed   By: Marcello Moores  Register   On: 05/03/2017 07:51   Dg Chest Port 1 View  Result Date: 05/02/2017 CLINICAL DATA:  Endotracheal tube placement.  Initial encounter. EXAM: PORTABLE CHEST 1 VIEW COMPARISON:  Chest radiograph performed  earlier today at 6:55 p.m. FINDINGS: The patient's endotracheal tube is seen ending 2 cm above the carina. There is new near complete opacification of the left hemithorax, reflecting a large  left-sided pleural effusion and associated airspace disease. Right perihilar airspace opacity is also new from the prior study. A trace right-sided pneumothorax is again seen, with 2 right-sided chest tubes again noted. The chest tubes have been readjusted since the prior study. The cardiomediastinal silhouette is not well assessed due to opacification of the left hemithorax. A valve replacement is noted. The patient is status post median sternotomy. No acute osseous abnormalities are seen. Diffuse soft tissue air is noted along the right chest wall and neck. IMPRESSION: 1. Endotracheal tube seen ending 2 cm above the carina. 2. New near complete opacification of the left hemithorax, reflecting a large left-sided pleural effusion and associated airspace disease. Right perihilar airspace opacity is also new from the prior study. This is concerning for pneumonia. 3. Trace right-sided pneumothorax again noted, with 2 right-sided chest tubes again seen. 4. Diffuse soft tissue air along the right chest wall and neck. Electronically Signed   By: Garald Balding M.D.   On: 05/02/2017 23:59   Dg Chest Port 1 View  Result Date: 05/02/2017 CLINICAL DATA:  Chest tube placement. EXAM: PORTABLE CHEST 1 VIEW COMPARISON:  Chest radiograph June 21st 1,018 at 1606 hours FINDINGS: Interval placement of RIGHT pigtail chest tube, retaining loop projecting along apex. Large bore RIGHT chest tube with side port projecting within the chest wall is unchanged. Trace residual basilar pneumothorax suspected though limited by extensive subcutaneous gas. RIGHT PICC distal tip projects at cavoatrial junction. Mild cardiomegaly, status post median sternotomy. No pleural effusion. Multiple acute RIGHT rib fractures again noted. IMPRESSION: New RIGHT chest  tube with trace basilar residual pneumothorax. Stable remaining life support lines. Electronically Signed   By: Elon Alas M.D.   On: 05/02/2017 19:13   Dg Chest Port 1 View  Result Date: 05/02/2017 CLINICAL DATA:  Chest asymmetry right-greater-than-left EXAM: PORTABLE CHEST 1 VIEW COMPARISON:  Portable chest x-ray of May 02, 2017 FINDINGS: There is a very large amount of subcutaneous emphysema over the right chest, axillary region, and the base of the neck on the right with a small amount of subcutaneous emphysema at the base of the neck on the left. There is a pneumothorax on the right the tip pleural line visible along the lower lateral thoracic wall. It is difficult to demonstrate a pleural line more superiorly. The tip and proximal port of the left chest tube overlie the posterior aspects of the right ninth and eighth ribs respectively. There is no mediastinal shift. There is increased density in the left lower lobe which is stable. The mid and upper left lung are clear. The heart is top-normal in size. The pulmonary vascularity is normal. There is soft tissue fullness in the left hilar region which is stable. The sternal wires are intact. There is a prosthetic aortic valve. The right-sided PICC line tip projects over the midportion of the SVC. IMPRESSION: Extensive subcutaneous emphysema on the right likely is responsible for the asymmetry of the thorax. A poorly defined at least 20% right-sided pneumothorax is present and more conspicuous than on the previous study. The right chest tube is in position as described. Persistent left lower lobe atelectasis or pneumonia. Electronically Signed   By: David  Martinique M.D.   On: 05/02/2017 16:25   Dg Abd Portable 1v  Result Date: 05/03/2017 CLINICAL DATA:  71 year old male status post feeding tube placement. EXAM: PORTABLE ABDOMEN - 1 VIEW COMPARISON:  Chest radiographs 0628 hours today and earlier. FINDINGS: Portable AP supine view at 1249 hours.  Enteric  feeding tube loops in the stomach and continues distally, the tip is located just to the right of midline at the level of the gastric antrum. Visible bowel gas pattern is within normal limits. Epicardial pacer wires and electrodes project over the epigastrium. Confluent opacity re- demonstrated at the left lung base. IMPRESSION: 1. Enteric tube tip at the gastric antrum level. Advanced about 6 cm to allow for transit into the duodenum. 2. Nonobstructed bowel-gas pattern. Electronically Signed   By: Genevie Ann M.D.   On: 05/03/2017 13:06     Medications:     Scheduled Medications: . acetaminophen  1,000 mg Oral Q6H   Or  . acetaminophen (TYLENOL) oral liquid 160 mg/5 mL  1,000 mg Per Tube Q6H  . aspirin EC  81 mg Oral Daily   Or  . aspirin  81 mg Per Tube Daily  . bisacodyl  10 mg Oral Daily   Or  . bisacodyl  10 mg Rectal Daily  . chlorhexidine gluconate (MEDLINE KIT)  15 mL Mouth Rinse BID  . Chlorhexidine Gluconate Cloth  6 each Topical Daily  . docusate  200 mg Oral Daily  . feeding supplement (PRO-STAT SUGAR FREE 64)  30 mL Per Tube TID  . insulin aspart  0-24 Units Subcutaneous Q4H  . levalbuterol  0.63 mg Nebulization Q6H  . mouth rinse  15 mL Mouth Rinse 10 times per day  . pantoprazole (PROTONIX) IV  40 mg Intravenous Q24H  . sodium chloride flush  10-40 mL Intracatheter Q12H  . sodium chloride flush  10-40 mL Intracatheter Q12H  . sodium chloride flush  3 mL Intravenous Q12H  . warfarin  2.5 mg Per NG tube q1800  . Warfarin - Physician Dosing Inpatient   Does not apply q1800    Infusions: . sodium chloride Stopped (04/30/17 0900)  . sodium chloride 250 mL (05/01/17 1000)  . sodium chloride Stopped (04/29/17 2000)  . amiodarone 30 mg/hr (05/04/17 0700)  . dexmedetomidine (PRECEDEX) IV infusion Stopped (05/02/17 1136)  . DOPamine 2.989 mcg/kg/min (05/04/17 0700)  . feeding supplement (VITAL 1.5 CAL) 1,000 mL (05/04/17 0700)  . fentaNYL infusion INTRAVENOUS 25  mcg/hr (05/04/17 0800)  . lactated ringers    . lactated ringers    . lactated ringers 20 mL/hr at 05/03/17 1500  . norepinephrine (LEVOPHED) Adult infusion      PRN Medications: sodium chloride, lactated ringers, midazolam, morphine injection, ondansetron (ZOFRAN) IV, sodium chloride flush, sodium chloride flush, sodium chloride flush   Assessment:   1. VT arrest: EP folllowing. No VT overnight.  - Continue IV amiodarone while intubated.  - Plan ICD prior to d/c 2. 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  EF now down to 10-15% from 25-30% pre-op. Suspect acute drop related to recent cardiac surgery, bypass, and arrest. Plan to repeat ECHO next week.  Remains on dopamine 2 mcg + norepi 10 mcg.  CVP 3.   - No diuretics today. - Will try to wean norepinephrine today, good BP currently off arterial line.   3. 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  4. Acute hypoxic respiratory failure: Has severe COPD, also with considerable LLL volume loss/atelectasis. Extubated 6/21 but re-intubated.  - Wean vent per CCM, ?bronchoscopy with LLL volume loss.   5. R PTX: R apical pneumo with chest tube x2 in place. TCTS managing  6. 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   Length of Stay: Why NP-C  05/04/2017, 10:07 AM  Advanced Heart Failure Team Pager [phone] (M-F; 7a - 4p)  Please contact [facility] for night-coverage after hours (4p -7a ) and weekends on [email or web address]

## 2017-05-04 NOTE — Plan of Care (Signed)
Problem: Activity: Goal: Risk for activity intolerance will decrease Outcome: Not Progressing Pt is on bedrest while respiratory status and fluid volume status improved, tolerated turning and sitting up in bed  Problem: Bowel/Gastric: Goal: Gastrointestinal status for postoperative course will improve Outcome: Not Progressing meds given toda  Problem: Nutritional: Goal: Risk for body nutrition deficit will decrease Outcome: Progressing Pt on goal tube feeds and protein supplement, paused earlier today for some nausea/vomiting  Problem: Respiratory: Goal: Levels of oxygenation will improve Outcome: Progressing FiO2 weaned down on vent today

## 2017-05-04 NOTE — Progress Notes (Signed)
5 Days Post-Op Procedure(s) (LRB): REDO STERNOTOMY (N/A) REPLACEMENT ASCENDING AORTA/ WHEAT PROCEDURE, HYPOTHERMIC CIRCULATORY ARREST AND CARDIOPULMONARY BYPASS, AND USE OF HEMASHIELD PLATINUM WOVEN DOUBLE VELOUR VASCULAR GRAFT 32 MM X 50 CM (N/A) TRANSESOPHAGEAL ECHOCARDIOGRAM (TEE) (N/A) Subjective: reintubated with R PNTX, chest tube + leak on 40 cm suction- tracheal aspirate for culture sent A-paced BP support with norepi due to sedation  Objective: Vital signs in last 24 hours: Temp:  [98.5 F (36.9 C)-99.2 F (37.3 C)] 98.9 F (37.2 C) (06/23 0715) Pulse Rate:  [85-94] 89 (06/23 0930) Cardiac Rhythm: Atrial paced (06/23 0800) Resp:  [17-25] 20 (06/23 0930) BP: (72-138)/(42-75) 106/54 (06/23 0930) SpO2:  [87 %-100 %] 94 % (06/23 0930) Arterial Line BP: (77-153)/(32-59) 127/47 (06/23 0930) FiO2 (%):  [40 %-100 %] 40 % (06/23 0829) Weight:  [152 lb 12.5 oz (69.3 kg)] 152 lb 12.5 oz (69.3 kg) (06/23 0500)  Hemodynamic parameters for last 24 hours: CVP:  [3 mmHg] 3 mmHg  Intake/Output from previous day: 06/22 0701 - 06/23 0700 In: 2481.6 [I.V.:1771.6; NG/GT:660; IV Piggyback:50] Out: 1585 [Urine:1455; Chest Tube:130] Intake/Output this shift: Total I/O In: 542.7 [I.V.:222.7; NG/GT:220; IV Piggyback:100] Out: 175 [Urine:175]       Exam    General- alert and comfortable on vent   Lungs- clear without rales, wheezes   Cor- regular rate and rhythm, no murmur , gallop   Abdomen- soft, non-tender   Extremities - warm, non-tender, minimal edema   Neuro- oriented, appropriate, no focal weakness   Lab Results:  Recent Labs  05/03/17 0429 05/04/17 0429  WBC 9.2 7.1  HGB 8.0* 7.9*  HCT 23.9* 23.2*  PLT 153 188   BMET:  Recent Labs  05/03/17 0429 05/04/17 0429  NA 131* 128*  K 3.9 3.4*  CL 97* 95*  CO2 27 27  GLUCOSE 111* 145*  BUN 10 14  CREATININE 0.80 0.77  CALCIUM 7.8* 7.5*    PT/INR:  Recent Labs  05/04/17 0429  LABPROT 39.1*  INR 3.89   ABG   Component Value Date/Time   PHART 7.484 (H) 05/03/2017 1219   HCO3 28.8 (H) 05/03/2017 1219   TCO2 30 05/03/2017 1219   ACIDBASEDEF 3.0 (H) 05/01/2017 0121   O2SAT 100.0 05/03/2017 1219   CBG (last 3)   Recent Labs  05/04/17 0008 05/04/17 0414 05/04/17 0738  GLUCAP 99 133* 111*    Assessment/Plan: S/P Procedure(s) (LRB): REDO STERNOTOMY (N/A) REPLACEMENT ASCENDING AORTA/ WHEAT PROCEDURE, HYPOTHERMIC CIRCULATORY ARREST AND CARDIOPULMONARY BYPASS, AND USE OF HEMASHIELD PLATINUM WOVEN DOUBLE VELOUR VASCULAR GRAFT 32 MM X 50 CM (N/A) TRANSESOPHAGEAL ECHOCARDIOGRAM (TEE) (N/A) reglan for nausea expected postop anemia Acute postop respiratory failure Hold coumadin for INR 4- old Bjork AVR   LOS: 5 days    Jack Joseph 05/04/2017

## 2017-05-04 NOTE — Progress Notes (Signed)
Progress Note  Patient Name: Kenon Delashmit Date of Encounter: 05/04/2017  Primary Cardiologist: [place]  Subjective   Remains intubated, but is awake  Inpatient Medications    Scheduled Meds: . acetaminophen  1,000 mg Oral Q6H   Or  . acetaminophen (TYLENOL) oral liquid 160 mg/5 mL  1,000 mg Per Tube Q6H  . aspirin EC  81 mg Oral Daily   Or  . aspirin  81 mg Per Tube Daily  . bisacodyl  10 mg Oral Daily   Or  . bisacodyl  10 mg Rectal Daily  . chlorhexidine gluconate (MEDLINE KIT)  15 mL Mouth Rinse BID  . Chlorhexidine Gluconate Cloth  6 each Topical Daily  . docusate sodium  200 mg Oral Daily  . feeding supplement (PRO-STAT SUGAR FREE 64)  30 mL Per Tube TID  . insulin aspart  0-24 Units Subcutaneous Q4H  . levalbuterol  0.63 mg Nebulization Q6H  . mouth rinse  15 mL Mouth Rinse 10 times per day  . pantoprazole (PROTONIX) IV  40 mg Intravenous Q24H  . sodium chloride flush  10-40 mL Intracatheter Q12H  . sodium chloride flush  10-40 mL Intracatheter Q12H  . sodium chloride flush  3 mL Intravenous Q12H  . warfarin  2.5 mg Per NG tube q1800  . Warfarin - Physician Dosing Inpatient   Does not apply q1800   Continuous Infusions: . sodium chloride Stopped (04/30/17 0900)  . sodium chloride 250 mL (05/01/17 1000)  . sodium chloride Stopped (04/29/17 2000)  . amiodarone 30 mg/hr (05/04/17 0700)  . dexmedetomidine (PRECEDEX) IV infusion Stopped (05/02/17 1136)  . DOPamine 2.989 mcg/kg/min (05/04/17 0700)  . feeding supplement (VITAL 1.5 CAL) 1,000 mL (05/04/17 0700)  . fentaNYL infusion INTRAVENOUS 25 mcg/hr (05/04/17 0800)  . lactated ringers    . lactated ringers    . lactated ringers 20 mL/hr at 05/03/17 1500  . norepinephrine (LEVOPHED) Adult infusion    . potassium chloride 10 mEq (05/04/17 0902)   PRN Meds: sodium chloride, lactated ringers, midazolam, morphine injection, ondansetron (ZOFRAN) IV, sodium chloride flush, sodium chloride flush, sodium chloride  flush   Vital Signs    Vitals:   05/04/17 0715 05/04/17 0730 05/04/17 0800 05/04/17 0829  BP:  116/61 (!) 109/59   Pulse: 90 90 89   Resp: _0 Temp: 98.9 F (37.2 C)     TempSrc: Oral     SpO2: 100% 99% 95% 97%  Weight:      Height:        Intake/Output Summary (Last 24 hours) at 05/04/17 0941 Last data filed at 05/04/17 0900  Gross per 24 hour  Intake          2460.65 ml  Output             1510 ml  Net           950.65 ml   Filed Weights   05/02/17 0500 05/03/17 0500 05/04/17 0500  Weight: 153 lb (69.4 kg) 153 lb 7 oz (69.6 kg) 152 lb 12.5 oz (69.3 kg)    Telemetry    nsr with atrial pacing - Personally Reviewed  ECG    NSR with atrial pacing - Personally Reviewed  Physical Exam   GEN: intubated but awake and alert Neck: 7 cm JVD, subcutaneous emphysema Cardiac: RRR, no murmurs, rubs, or gallops.  Respiratory: Clear to auscultation bilaterally. GI: Soft, nontender, non-distended  MS: No edema; No deformity. Neuro:  Nonfocal  Psych: Normal affect   Labs    Chemistry Recent Labs Lab 05/01/17 0115 05/02/17 0047 05/03/17 0429 05/04/17 0429  NA 134* 131* 131* 128*  K 3.7 3.8 3.9 3.4*  CL 101 97* 97* 95*  CO2 _0 GLUCOSE 179* 148* 111* 145*  BUN _1 CREATININE 1.31* 0.94 0.80 0.77  CALCIUM 8.0* 7.7* 7.8* 7.5*  PROT 5.1* 5.0*  --  4.8*  ALBUMIN 3.3* 3.0*  --  2.3*  AST 42* 36  --  22  ALT 18 20  --  18  ALKPHOS 30* 37*  --  49  BILITOT 0.9 0.7  --  0.8  GFRNONAA 53* >60 >60 >60  GFRAA >60 >60 >60 >60  ANIONGAP _2 Hematology Recent Labs Lab 05/02/17 0047 05/03/17 0429 05/04/17 0429  WBC 11.9* 9.2 7.1  RBC 2.81* 2.62* 2.53*  HGB 8.8* 8.0* 7.9*  HCT 25.1* 23.9* 23.2*  MCV 89.3 91.2 91.7  MCH 31.3 30.5 31.2  MCHC 35.1 33.5 34.1  RDW 14.2 14.9 14.9  PLT 115* 153 188    Cardiac Enzymes Recent Labs Lab 05/01/17 0737 05/01/17 1314 05/01/17 2005  TROPONINI 0.46* 0.44* 0.43*   No results for  input(s): TROPIPOC in the last 168 hours.   BNPNo results for input(s): BNP, PROBNP in the last 168 hours.   DDimer No results for input(s): DDIMER in the last 168 hours.   Radiology    Dg Chest Port 1 View  Result Date: 05/04/2017 CLINICAL DATA:  Chest tube. EXAM: PORTABLE CHEST 1 VIEW COMPARISON:  05/03/2017 FINDINGS: Endotracheal tube terminates approximately 4.5 cm above the carina. Enteric tube courses into the left upper abdomen with tip not imaged. Two right-sided pleural catheters remain in place. A right PICC terminates over the lower SVC. Prior aortic valve replacement is noted. The cardiomediastinal silhouette is unchanged. There is a small to moderate size left pleural effusion which has decreased from the prior study with improved aeration of the left lung. No definite pneumothorax is identified. Subcutaneous emphysema remains in the right chest wall and neck. IMPRESSION: 1. Decreased size of left pleural effusion with improved left lung aeration. 2. Support devices as above.  No definite pneumothorax. Electronically Signed   By: Logan Bores M.D.   On: 05/04/2017 07:25   Dg Chest Port 1 View  Result Date: 05/03/2017 CLINICAL DATA:  Intubation . EXAM: PORTABLE CHEST 1 VIEW COMPARISON:  05/02/2017 . FINDINGS: Endotracheal tube, NG tube, right PICC line, right chest tubes in stable position. Minimal residual right sided pneumothorax. Slight improvement aeration of left lung. Persistent consolidation and atelectasis and persistent left-sided pleural effusion noted. Pleural effusion has improved from prior exam. Prior median sternotomy and CABG. Prior cardiac valve replacement. Cardiomegaly. No pulmonary venous congestion. Diffuse right chest wall subcutaneous emphysema again noted. IMPRESSION: 1. Lines and tubes including 2 right chest tubes in stable position. Minimal residual right-sided pneumothorax. Diffuse right chest wall subcutaneous emphysema again noted. 2. Slight improvement of  aeration of left lung. Persistent consolidation and atelectasis and persistent left-sided pleural effusion noted. Pleural effusion has improved from prior exam . 3. Prior CABG. Prior cardiac valve replacement. Cardiomegaly. No pulmonary venous congestion . Electronically Signed   By: Marcello Moores  Register   On: 05/03/2017 07:51   Dg Chest Port 1 View  Result Date: 05/02/2017 CLINICAL DATA:  Endotracheal tube placement.  Initial encounter. EXAM: PORTABLE CHEST 1 VIEW COMPARISON:  Chest radiograph performed  earlier today at 6:55 p.m. FINDINGS: The patient's endotracheal tube is seen ending 2 cm above the carina. There is new near complete opacification of the left hemithorax, reflecting a large left-sided pleural effusion and associated airspace disease. Right perihilar airspace opacity is also new from the prior study. A trace right-sided pneumothorax is again seen, with 2 right-sided chest tubes again noted. The chest tubes have been readjusted since the prior study. The cardiomediastinal silhouette is not well assessed due to opacification of the left hemithorax. A valve replacement is noted. The patient is status post median sternotomy. No acute osseous abnormalities are seen. Diffuse soft tissue air is noted along the right chest wall and neck. IMPRESSION: 1. Endotracheal tube seen ending 2 cm above the carina. 2. New near complete opacification of the left hemithorax, reflecting a large left-sided pleural effusion and associated airspace disease. Right perihilar airspace opacity is also new from the prior study. This is concerning for pneumonia. 3. Trace right-sided pneumothorax again noted, with 2 right-sided chest tubes again seen. 4. Diffuse soft tissue air along the right chest wall and neck. Electronically Signed   By: Garald Balding M.D.   On: 05/02/2017 23:59   Dg Chest Port 1 View  Result Date: 05/02/2017 CLINICAL DATA:  Chest tube placement. EXAM: PORTABLE CHEST 1 VIEW COMPARISON:  Chest radiograph  June 21st 1,018 at 1606 hours FINDINGS: Interval placement of RIGHT pigtail chest tube, retaining loop projecting along apex. Large bore RIGHT chest tube with side port projecting within the chest wall is unchanged. Trace residual basilar pneumothorax suspected though limited by extensive subcutaneous gas. RIGHT PICC distal tip projects at cavoatrial junction. Mild cardiomegaly, status post median sternotomy. No pleural effusion. Multiple acute RIGHT rib fractures again noted. IMPRESSION: New RIGHT chest tube with trace basilar residual pneumothorax. Stable remaining life support lines. Electronically Signed   By: Elon Alas M.D.   On: 05/02/2017 19:13   Dg Chest Port 1 View  Result Date: 05/02/2017 CLINICAL DATA:  Chest asymmetry right-greater-than-left EXAM: PORTABLE CHEST 1 VIEW COMPARISON:  Portable chest x-ray of May 02, 2017 FINDINGS: There is a very large amount of subcutaneous emphysema over the right chest, axillary region, and the base of the neck on the right with a small amount of subcutaneous emphysema at the base of the neck on the left. There is a pneumothorax on the right the tip pleural line visible along the lower lateral thoracic wall. It is difficult to demonstrate a pleural line more superiorly. The tip and proximal port of the left chest tube overlie the posterior aspects of the right ninth and eighth ribs respectively. There is no mediastinal shift. There is increased density in the left lower lobe which is stable. The mid and upper left lung are clear. The heart is top-normal in size. The pulmonary vascularity is normal. There is soft tissue fullness in the left hilar region which is stable. The sternal wires are intact. There is a prosthetic aortic valve. The right-sided PICC line tip projects over the midportion of the SVC. IMPRESSION: Extensive subcutaneous emphysema on the right likely is responsible for the asymmetry of the thorax. A poorly defined at least 20% right-sided  pneumothorax is present and more conspicuous than on the previous study. The right chest tube is in position as described. Persistent left lower lobe atelectasis or pneumonia. Electronically Signed   By: David  Martin[place] M.D.   On: 05/02/2017 16:25   Dg Abd Portable 1v  Result Date: 05/03/2017 CLINICAL DATA:  71 year old male status post feeding tube placement. EXAM: PORTABLE ABDOMEN - 1 VIEW COMPARISON:  Chest radiographs 0628 hours today and earlier. FINDINGS: Portable AP supine view at 1249 hours. Enteric feeding tube loops in the stomach and continues distally, the tip is located just to the right of midline at the level of the gastric antrum. Visible bowel gas pattern is within normal limits. Epicardial pacer wires and electrodes project over the epigastrium. Confluent opacity re- demonstrated at the left lung base. IMPRESSION: 1. Enteric tube tip at the gastric antrum level. Advanced about 6 cm to allow for transit into the duodenum. 2. Nonobstructed bowel-gas pattern. Electronically Signed   By: Genevie Ann M.D.   On: 05/03/2017 13:06    Cardiac Studies   none  Patient Profile     71 y.o. male admitted for aortic root replacement, developed VT, re-intubated with VDRF  Assessment & Plan    1. VT - he has maintained NSR with minimal NSVT over last 24 hours. Continue IV amio, switching to oral amio after extubation. He will need an ICD if her survives his hospitalization intact. 2. VDRF -  See Dr. Agustina Caroli note. Not thought to be ready for extubation at this time. 3. Acute on chronic systolic heart failure - he has been followed by our CHF service. Continue IV pressors. No indication for beta blocker or afterload reduction at this point.  4. coags - his INR is therapeutic. Will follow. On coumadin down NG tube.  Signed, Cristopher Peru, MD  05/04/2017, 9:41 AM  Patient ID: [name], male   DOB: 1946-09-20, 71 y.o.   MRN: [id number]

## 2017-05-04 NOTE — Progress Notes (Signed)
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  Underwent ascending aortic replacement/wheat procedure with CABG.  Was progressing well post op and ambulating in hall.  However just after midnight 6/20 had VT arrest with CPR and shock x 3.  Intubated during ACLS, small R ptx noted post CPR and CT placed by CVTS.  PCCM consulted for vent management.   SUBJECTIVE:  Dopamine and norepinephrine titrated up over last 24 hours Chest x-ray still with significant left lower lobe volume loss slightly improved   VITAL SIGNS: BP (!) 101/48   Pulse 88   Temp 99.2 F (37.3 C) (Oral)   Resp 20   Ht 5\' 10"  (1.778 m)   Wt 69.3 kg (152 lb 12.5 oz)   SpO2 94%   BMI 21.92 kg/m   HEMODYNAMICS: CVP:  [0 mmHg] 0 mmHg  VENTILATOR SETTINGS: Vent Mode: PRVC FiO2 (%):  [50 %-100 %] 50 % Set Rate:  [20 bmp] 20 bmp Vt Set:  [580 mL] 580 mL PEEP:  [5 cmH20] 5 cmH20 Plateau Pressure:  [19 cmH20-22 cmH20] 22 cmH20  INTAKE / OUTPUT: I/O last 3 completed shifts: In: 3219.1 [I.V.:2549.1; NG/GT:270; IV Piggyback:400] Out: 2660 [Urine:2440; Chest Tube:220]  PHYSICAL EXAMINATION: General:  Ill-appearing man on mechanical ventilation Neuro:  Wakes easily, not questions, globally weak, weak cough HEENT:  Endotracheal tube in place, oropharynx clear Cardiovascular:  Regular, distant, no rub, sternotomy clean dry and intact Lungs:  Bilateral scattered rhonchi, decreased breath sounds on the left, right-sided chest tube, right sided subcutaneous air Abdomen: Soft,  benign Musculoskeletal:  No deformities  LABS:  BMET  Recent Labs Lab 05/02/17 0047 05/03/17 0429 05/04/17 0429  NA 131* 131* 128*  K 3.8 3.9 3.4*  CL 97* 97* 95*  CO2 25 27 27   BUN 11 10 14   CREATININE 0.94 0.80 0.77  GLUCOSE 148* 111* 145*    Electrolytes  Recent Labs Lab 05/02/17 0047  05/03/17 0429 05/03/17 1845 05/04/17 0429  CALCIUM 7.7*  --  7.8*  --  7.5*  MG  --   < > 1.7 2.3 2.0  PHOS  --   --   --  2.9 2.7  < > = values in this interval not displayed.  CBC  Recent Labs Lab 05/02/17 0047 05/03/17 0429 05/04/17 0429  WBC 11.9* 9.2 7.1  HGB 8.8* 8.0* 7.9*  HCT 25.1* 23.9* 23.2*  PLT 115* 153 188    Coag's  Recent Labs Lab 04/29/17 1502  05/02/17 0047 05/03/17 0725 05/04/17 0429  APTT 34  --   --   --   --   INR 1.54  < > 1.63 3.49 3.89  < > = values in this interval not displayed.  Sepsis Markers  Recent Labs Lab 05/01/17 0755  LATICACIDVEN 1.2    ABG  Recent Labs Lab 05/01/17 1044 05/03/17 0007 05/03/17 1219  PHART 7.514* 7.466* 7.484*  PCO2ART 34.1 36.3 38.3  PO2ART 211.0* 60.0* 165.0*    Liver Enzymes  Recent Labs Lab 05/01/17 0115 05/02/17 0047 05/04/17 0429  AST 42* 36 22  ALT 18 20 18   ALKPHOS 30* 37* 49  BILITOT 0.9 0.7 0.8  ALBUMIN 3.3* 3.0* 2.3*    Cardiac Enzymes  Recent Labs Lab 05/01/17 0737 05/01/17 1314 05/01/17 2005  TROPONINI 0.46* 0.44* 0.43*    Glucose  Recent Labs Lab 05/03/17 0733 05/03/17 1119 05/03/17 1604 05/03/17 1943 05/04/17 0008 05/04/17 0414  GLUCAP 96 111* 110* 148* 99 133*    Imaging Dg Abd Portable 1v  Result Date: 05/03/2017 CLINICAL DATA:  71 year old male status post feeding tube placement. EXAM: PORTABLE ABDOMEN - 1 VIEW COMPARISON:  Chest radiographs 0628 hours today and earlier. FINDINGS: Portable AP supine view at 1249 hours. Enteric feeding tube loops in the stomach and continues distally, the tip is located just to the right of midline at the level of  the gastric antrum. Visible bowel gas pattern is within normal limits. Epicardial pacer wires and electrodes project over the epigastrium. Confluent opacity re- demonstrated at the left lung base. IMPRESSION: 1. Enteric tube tip at the gastric antrum level. Advanced about 6 cm to allow for transit into the duodenum. 2. Nonobstructed bowel-gas pattern. Electronically Signed   By: Odessa Fleming M.D.   On: 05/03/2017 13:06     STUDIES:  2D echo 6/20>>> - Left ventricle: The cavity size was normal. Wall thickness was   normal. Systolic function was severely reduced. The estimated   ejection fraction was in the range of 10% to 15%. Doppler   parameters are consistent with abnormal left ventricular   relaxation (grade 1 diastolic dysfunction). - Aortic valve: A mechanical prosthesis was present. - Mitral valve: There was moderate regurgitation. - Left atrium: The atrium was mildly dilated. - Right ventricle: Systolic function was severely reduced. - Right atrium: The atrium was mildly dilated. - Tricuspid valve: There was mild-moderate regurgitation. - Pericardium, extracardiac: There was a left pleural effusion.  CULTURES:   ANTIBIOTICS:   SIGNIFICANT EVENTS: 6/20  LINES/TUBES: ETT 6/20>>> 6/21; 6/21 >>  R chest tube #1 6/20>>> R chest tube #2 6/21 >>   DISCUSSION: 71yo male with VT arrest POD#4 thoracic aortic aneurysm repair.   ASSESSMENT / PLAN:  PULMONARY Acute respiratory failure - post VT arrest  Hx COPD  Right pneumothorax (after CPR) status post chest tube 2 Severe left-sided atelectasis, poor secretion clearance P:   Reintubated 6/21, contributors include resp muscle weakness and poor secretion clearance with left-sided atelectasis, possible contribution of transient worsening of his right pneumothorax although unclear.  Continue current PRVC, daily attempts at pressure support ventilation. Unclear to me that he is ready for extubation at this time given his left lower lobe  volume loss Push pulmonary hygiene and chest physical therapy Consider bronchoscopy if left lower lobe isn't clearing Scheduled bronchodilators  CARDIOVASCULAR VT arrest 6/20 - unclear etiology.  Was not cooled overnight r/t short duration CPR, good mental status post arrest.  Repair thoracic aortic aneurysm 6/18 LV dysfunction - previous EF 30-35%  Hx AVR  Acute on chronic CHF - EF 10-15% Shock - cardiogenic.   P:  Planning for AICD when stable to do so Bolused amiodarone if recurrent VT Dopamine, amiodarone, norepinephrine, diuresis Other postop management, drip management per TCTS plans Beta blocker on hold, aspirin Coumadin  RENAL AKI - mild, improved P:   Follow BMP, urine output  GASTROINTESTINAL No active issue  P:   Tube feeding PPI  HEMATOLOGIC Chronic anticoagulation, mechanical valve P: Currently on Coumadin, consider transition back to heparin drip depending on whether he will be able  to take by mouth  INFECTIOUS No active issue  P:   Following clinically off antibiotics  ENDOCRINE Hyperglycemia   P:   Sliding-scale insulin   NEUROLOGIC Sedation needs on vent - mental status appears intact post arrest.  Follows commands per RN and family.  P:   RASS goal:-1 to 0 Currently on fentanyl drip Versed as needed   FAMILY  - Updates:  Wife and son updated at bedside 6/20 and 6/21.   - Inter-disciplinary family meet or Palliative Care meeting due by:  6/27   Independent critical care time is 32 minutes.   Levy Pupa, MD, PhD 05/04/2017, 6:49 AM [facility] [phone] or if no answer [phone]

## 2017-05-04 NOTE — Plan of Care (Signed)
Problem: Coping: Goal: Level of anxiety will decrease Outcome: Progressing Pt is calm and comfortable  Problem: Role Relationship: Goal: Method of communication will improve Outcome: Progressing Pt communicates well with white board, nodding and gesturing appropriately

## 2017-05-05 ENCOUNTER — Inpatient Hospital Stay (HOSPITAL_COMMUNITY): Payer: Medicare Other

## 2017-05-05 DIAGNOSIS — G9340 Encephalopathy, unspecified: Secondary | ICD-10-CM

## 2017-05-05 DIAGNOSIS — I10 Essential (primary) hypertension: Secondary | ICD-10-CM

## 2017-05-05 LAB — CBC
HCT: 21.4 % — ABNORMAL LOW (ref 39.0–52.0)
Hemoglobin: 7 g/dL — ABNORMAL LOW (ref 13.0–17.0)
MCH: 29.9 pg (ref 26.0–34.0)
MCHC: 32.7 g/dL (ref 30.0–36.0)
MCV: 91.5 fL (ref 78.0–100.0)
PLATELETS: 210 10*3/uL (ref 150–400)
RBC: 2.34 MIL/uL — AB (ref 4.22–5.81)
RDW: 14.8 % (ref 11.5–15.5)
WBC: 5.3 10*3/uL (ref 4.0–10.5)

## 2017-05-05 LAB — TYPE AND SCREEN
ABO/RH(D): A POS
Antibody Screen: NEGATIVE

## 2017-05-05 LAB — GLUCOSE, CAPILLARY
Glucose-Capillary: 102 mg/dL — ABNORMAL HIGH (ref 65–99)
Glucose-Capillary: 119 mg/dL — ABNORMAL HIGH (ref 65–99)
Glucose-Capillary: 129 mg/dL — ABNORMAL HIGH (ref 65–99)
Glucose-Capillary: 133 mg/dL — ABNORMAL HIGH (ref 65–99)
Glucose-Capillary: 136 mg/dL — ABNORMAL HIGH (ref 65–99)
Glucose-Capillary: 97 mg/dL (ref 65–99)

## 2017-05-05 LAB — COOXEMETRY PANEL
CARBOXYHEMOGLOBIN: 1.3 % (ref 0.5–1.5)
METHEMOGLOBIN: 1.3 % (ref 0.0–1.5)
O2 Saturation: 68.8 %
Total hemoglobin: 7.1 g/dL — ABNORMAL LOW (ref 12.0–16.0)

## 2017-05-05 LAB — POCT I-STAT 3, ART BLOOD GAS (G3+)
Acid-Base Excess: 4 mmol/L — ABNORMAL HIGH (ref 0.0–2.0)
Bicarbonate: 28.7 mmol/L — ABNORMAL HIGH (ref 20.0–28.0)
O2 Saturation: 96 %
Patient temperature: 100.3
TCO2: 30 mmol/L (ref 0–100)
pCO2 arterial: 42.2 mmHg (ref 32.0–48.0)
pH, Arterial: 7.444 (ref 7.350–7.450)
pO2, Arterial: 80 mmHg — ABNORMAL LOW (ref 83.0–108.0)

## 2017-05-05 LAB — BASIC METABOLIC PANEL
ANION GAP: 6 (ref 5–15)
BUN: 22 mg/dL — ABNORMAL HIGH (ref 6–20)
CHLORIDE: 96 mmol/L — AB (ref 101–111)
CO2: 27 mmol/L (ref 22–32)
CREATININE: 0.77 mg/dL (ref 0.61–1.24)
Calcium: 7.7 mg/dL — ABNORMAL LOW (ref 8.9–10.3)
GFR calc non Af Amer: >60 mL/min (ref >60)
Glucose, Bld: 122 mg/dL — ABNORMAL HIGH (ref 65–99)
POTASSIUM: 3.7 mmol/L (ref 3.5–5.1)
SODIUM: 129 mmol/L — AB (ref 135–145)

## 2017-05-05 LAB — POCT I-STAT, CHEM 8
BUN: 28 mg/dL — ABNORMAL HIGH (ref 6–20)
Calcium, Ion: 1.09 mmol/L — ABNORMAL LOW (ref 1.15–1.40)
Chloride: 92 mmol/L — ABNORMAL LOW (ref 101–111)
Creatinine, Ser: 0.9 mg/dL (ref 0.61–1.24)
Glucose, Bld: 125 mg/dL — ABNORMAL HIGH (ref 65–99)
HCT: 28 % — ABNORMAL LOW (ref 39.0–52.0)
Hemoglobin: 9.5 g/dL — ABNORMAL LOW (ref 13.0–17.0)
Potassium: 4.3 mmol/L (ref 3.5–5.1)
Sodium: 133 mmol/L — ABNORMAL LOW (ref 135–145)
TCO2: 32 mmol/L (ref 0–100)

## 2017-05-05 LAB — PROTIME-INR
INR: 2.17
Prothrombin Time: 24.6 seconds — ABNORMAL HIGH (ref 11.4–15.2)

## 2017-05-05 LAB — MAGNESIUM: Magnesium: 1.9 mg/dL (ref 1.7–2.4)

## 2017-05-05 LAB — PREPARE RBC (CROSSMATCH)

## 2017-05-05 MED ORDER — SODIUM CHLORIDE 3 % IN NEBU
4.0000 mL | INHALATION_SOLUTION | Freq: Two times a day (BID) | RESPIRATORY_TRACT | Status: DC
  Administered 2017-05-06 – 2017-05-11 (×11): 4 mL via RESPIRATORY_TRACT
  Filled 2017-05-05 (×12): qty 4

## 2017-05-05 MED ORDER — POTASSIUM CHLORIDE 10 MEQ/50ML IV SOLN
10.0000 meq | INTRAVENOUS | Status: AC
  Administered 2017-05-05 (×3): 10 meq via INTRAVENOUS
  Filled 2017-05-05 (×3): qty 50

## 2017-05-05 MED ORDER — SODIUM CHLORIDE 0.9 % IV SOLN
Freq: Once | INTRAVENOUS | Status: AC
  Administered 2017-05-05: 10 mL/h via INTRAVENOUS

## 2017-05-05 MED ORDER — PIPERACILLIN-TAZOBACTAM 3.375 G IVPB
3.3750 g | Freq: Three times a day (TID) | INTRAVENOUS | Status: AC
  Administered 2017-05-05 – 2017-05-14 (×29): 3.375 g via INTRAVENOUS
  Filled 2017-05-05 (×32): qty 50

## 2017-05-05 MED ORDER — FUROSEMIDE 10 MG/ML IJ SOLN
INTRAMUSCULAR | Status: AC
  Filled 2017-05-05: qty 2

## 2017-05-05 MED ORDER — WARFARIN SODIUM 2 MG PO TABS
2.0000 mg | ORAL_TABLET | Freq: Every day | ORAL | Status: DC
  Administered 2017-05-05: 2 mg via ORAL
  Filled 2017-05-05: qty 1

## 2017-05-05 MED ORDER — VANCOMYCIN HCL IN DEXTROSE 750-5 MG/150ML-% IV SOLN
750.0000 mg | Freq: Two times a day (BID) | INTRAVENOUS | Status: DC
  Administered 2017-05-05 – 2017-05-09 (×9): 750 mg via INTRAVENOUS
  Filled 2017-05-05 (×9): qty 150

## 2017-05-05 MED ORDER — MAGNESIUM SULFATE 2 GM/50ML IV SOLN
2.0000 g | Freq: Once | INTRAVENOUS | Status: AC
  Administered 2017-05-05: 2 g via INTRAVENOUS
  Filled 2017-05-05: qty 50

## 2017-05-05 MED ORDER — FUROSEMIDE 10 MG/ML IJ SOLN: 40.0000 mg | Freq: Two times a day (BID) | INTRAMUSCULAR | Status: DC

## 2017-05-05 MED ORDER — FUROSEMIDE 10 MG/ML IJ SOLN
20.0000 mg | Freq: Once | INTRAMUSCULAR | Status: AC
  Administered 2017-05-05: 20 mg via INTRAVENOUS

## 2017-05-05 MED ORDER — POTASSIUM CHLORIDE 20 MEQ PO PACK
40.0000 meq | PACK | Freq: Once | ORAL | Status: AC
  Administered 2017-05-05: 40 meq via ORAL
  Filled 2017-05-05: qty 2

## 2017-05-05 MED ORDER — LEVALBUTEROL HCL 0.63 MG/3ML IN NEBU
0.6300 mg | INHALATION_SOLUTION | Freq: Three times a day (TID) | RESPIRATORY_TRACT | Status: DC
  Administered 2017-05-05 – 2017-05-13 (×24): 0.63 mg via RESPIRATORY_TRACT
  Filled 2017-05-05 (×25): qty 3

## 2017-05-05 MED ORDER — LEVALBUTEROL HCL 0.63 MG/3ML IN NEBU
0.6300 mg | INHALATION_SOLUTION | RESPIRATORY_TRACT | Status: DC | PRN
  Administered 2017-05-07 – 2017-05-23 (×2): 0.63 mg via RESPIRATORY_TRACT
  Filled 2017-05-05 (×2): qty 3

## 2017-05-05 MED ORDER — GUAIFENESIN ER 600 MG PO TB12: 600.0000 mg | ORAL_TABLET | Freq: Two times a day (BID) | ORAL | Status: DC

## 2017-05-05 MED ORDER — SORBITOL 70 % SOLN: 60.0000 mL | Freq: Every day | Status: DC | PRN

## 2017-05-05 MED ORDER — FUROSEMIDE 10 MG/ML IJ SOLN
40.0000 mg | Freq: Two times a day (BID) | INTRAMUSCULAR | Status: DC
  Administered 2017-05-05 – 2017-05-06 (×4): 40 mg via INTRAVENOUS
  Filled 2017-05-05 (×5): qty 4

## 2017-05-05 NOTE — Plan of Care (Signed)
Problem: Respiratory: Goal: Ability to maintain a clear airway and adequate ventilation will improve Outcome: Progressing Pt extubated today but needs lots of pulmonary toliet, weak right now

## 2017-05-05 NOTE — Progress Notes (Signed)
Progress Note  Patient Name: Jack Joseph Date of Encounter: 05/05/2017  Primary Cardiologist: [place]  Subjective   Remains intubated. Awake.   Inpatient Medications    Scheduled Meds: . aspirin EC  81 mg Oral Daily   Or  . aspirin  81 mg Per Tube Daily  . bisacodyl  10 mg Oral Daily   Or  . bisacodyl  10 mg Rectal Daily  . chlorhexidine gluconate (MEDLINE KIT)  15 mL Mouth Rinse BID  . Chlorhexidine Gluconate Cloth  6 each Topical Daily  . docusate  200 mg Oral Daily  . feeding supplement (PRO-STAT SUGAR FREE 64)  30 mL Per Tube TID  . furosemide  40 mg Intravenous BID  . insulin aspart  0-24 Units Subcutaneous Q4H  . levalbuterol  0.63 mg Nebulization Q6H  . magic mouthwash  5 mL Oral TID  . mouth rinse  15 mL Mouth Rinse 10 times per day  . metoCLOPramide (REGLAN) injection  10 mg Intravenous Q6H  . pantoprazole (PROTONIX) IV  40 mg Intravenous Q24H  . sodium chloride flush  10-40 mL Intracatheter Q12H  . sodium chloride flush  3 mL Intravenous Q12H  . sorbitol  30 mL Oral q morning - 10a  . Warfarin - Physician Dosing Inpatient   Does not apply q1800   Continuous Infusions: . amiodarone 30 mg/hr (05/05/17 0605)  . dexmedetomidine (PRECEDEX) IV infusion Stopped (05/02/17 1136)  . DOPamine 3 mcg/kg/min (05/05/17 0400)  . feeding supplement (VITAL 1.5 CAL) 1,000 mL (05/05/17 0400)  . fentaNYL infusion INTRAVENOUS 25 mcg/hr (05/05/17 0400)  . norepinephrine (LEVOPHED) Adult infusion 8.5 mcg/min (05/05/17 0800)   PRN Meds: midazolam, morphine injection, ondansetron (ZOFRAN) IV, sodium chloride flush, sodium chloride flush   Vital Signs    Vitals:   05/05/17 0508 05/05/17 0600 05/05/17 0700 05/05/17 0818  BP:  (!) 100/47 (!) 107/52   Pulse:  72 70   Resp:  20 19   Temp:   99.9 F (37.7 C)   TempSrc:   Oral   SpO2:  100% 98% 96%  Weight: 157 lb 13.6 oz (71.6 kg)     Height:        Intake/Output Summary (Last 24 hours) at 05/05/17 0958 Last data  filed at 05/05/17 0800  Gross per 24 hour  Intake          2735.86 ml  Output             1725 ml  Net          1010.86 ml   Filed Weights   05/03/17 0500 05/04/17 0500 05/05/17 0508  Weight: 153 lb 7 oz (69.6 kg) 152 lb 12.5 oz (69.3 kg) 157 lb 13.6 oz (71.6 kg)    Telemetry    nsr - Personally Reviewed  ECG    none - Personally Reviewed  Physical Exam   GEN: awake, intubated.  Neck: 6 cm JVD Cardiac: RRR, no murmurs, rubs, or gallops.  Respiratory: Clear to auscultation bilaterally. GI: Soft, nontender, non-distended  MS: No edema; No deformity. Subcutaneous emphysema remains present Neuro:  Nonfocal  Psych: Normal affect   Labs    Chemistry Recent Labs Lab 05/01/17 0115 05/02/17 0047 05/03/17 0429 05/04/17 0429 05/04/17 1935 05/05/17 0337  NA 134* 131* 131* 128* 134* 129*  K 3.7 3.8 3.9 3.4* 3.8 3.7  CL 101 97* 97* 95* 92* 96*  CO2 '22 25 27 27  '$ --  27  GLUCOSE 179* 148* 111*  145* 152* 122*  BUN '12 11 10 14 20 '$ 22*  CREATININE 1.31* 0.94 0.80 0.77 0.80 0.77  CALCIUM 8.0* 7.7* 7.8* 7.5*  --  7.7*  PROT 5.1* 5.0*  --  4.8*  --   --   ALBUMIN 3.3* 3.0*  --  2.3*  --   --   AST 42* 36  --  22  --   --   ALT 18 20  --  18  --   --   ALKPHOS 30* 37*  --  49  --   --   BILITOT 0.9 0.7  --  0.8  --   --   GFRNONAA 53* >60 >60 >60  --  >60  GFRAA >60 >60 >60 >60  --  >60  ANIONGAP '11 9 7 6  '$ --  6     Hematology Recent Labs Lab 05/03/17 0429 05/04/17 0429 05/04/17 1935 05/05/17 0337  WBC 9.2 7.1  --  5.3  RBC 2.62* 2.53*  --  2.34*  HGB 8.0* 7.9* 7.1* 7.0*  HCT 23.9* 23.2* 21.0* 21.4*  MCV 91.2 91.7  --  91.5  MCH 30.5 31.2  --  29.9  MCHC 33.5 34.1  --  32.7  RDW 14.9 14.9  --  14.8  PLT 153 188  --  210    Cardiac Enzymes Recent Labs Lab 05/01/17 0737 05/01/17 1314 05/01/17 2005  TROPONINI 0.46* 0.44* 0.43*   No results for input(s): TROPIPOC in the last 168 hours.   BNPNo results for input(s): BNP, PROBNP in the last 168 hours.    DDimer No results for input(s): DDIMER in the last 168 hours.   Radiology    Dg Chest Port 1 View  Result Date: 05/05/2017 CLINICAL DATA:  Endotracheal tube EXAM: PORTABLE CHEST 1 VIEW COMPARISON:  05/04/2017 FINDINGS: Two LEFT chest tubes in place. Small apical pneumothorax measures 10 mm from the apical chest wall similar to 12 mm on prior. Extensive subcutaneous gas along the RIGHT chest wall unchanged. Endotracheal tube, NG tube, and PICC line unchanged. LEFT basilar atelectasis. IMPRESSION: 1. Small RIGHT apical pneumothorax with 2 chest tubes in place. 2. No significant change.  Stable support apparatus. 3. LEFT basilar atelectasis. These results will be called to the ordering clinician or representative by the Radiologist Assistant, and communication documented in the PACS or zVision Dashboard. Electronically Signed   By: Suzy Bouchard M.D.   On: 05/05/2017 08:20   Dg Chest Port 1 View  Result Date: 05/04/2017 CLINICAL DATA:  Chest tube. EXAM: PORTABLE CHEST 1 VIEW COMPARISON:  05/03/2017 FINDINGS: Endotracheal tube terminates approximately 4.5 cm above the carina. Enteric tube courses into the left upper abdomen with tip not imaged. Two right-sided pleural catheters remain in place. A right PICC terminates over the lower SVC. Prior aortic valve replacement is noted. The cardiomediastinal silhouette is unchanged. There is a small to moderate size left pleural effusion which has decreased from the prior study with improved aeration of the left lung. No definite pneumothorax is identified. Subcutaneous emphysema remains in the right chest wall and neck. IMPRESSION: 1. Decreased size of left pleural effusion with improved left lung aeration. 2. Support devices as above.  No definite pneumothorax. Electronically Signed   By: Logan Bores M.D.   On: 05/04/2017 07:25   Dg Abd Portable 1v  Result Date: 05/03/2017 CLINICAL DATA:  71 year old male status post feeding tube placement. EXAM: PORTABLE  ABDOMEN - 1 VIEW COMPARISON:  Chest radiographs 0628 hours today  and earlier. FINDINGS: Portable AP supine view at 1249 hours. Enteric feeding tube loops in the stomach and continues distally, the tip is located just to the right of midline at the level of the gastric antrum. Visible bowel gas pattern is within normal limits. Epicardial pacer wires and electrodes project over the epigastrium. Confluent opacity re- demonstrated at the left lung base. IMPRESSION: 1. Enteric tube tip at the gastric antrum level. Advanced about 6 cm to allow for transit into the duodenum. 2. Nonobstructed bowel-gas pattern. Electronically Signed   By: Genevie Ann M.D.   On: 05/03/2017 13:06    Cardiac Studies   none  Patient Profile     71 y.o. male s/p aortic root replacement, complicated by VT, re-intubation, now with VDRF, requiring pressors.   Assessment & Plan    1. VT - his VT has quieted down on IV amio. We can switch to po when he is extubated and taking po. 2. VDRF - as per pulmonary CCM. Hopefully we can wean down his pressors.  3. Subcutaneous emphysema - his chest tube ouput is low. Appears to be slowly resolving  Signed, Cristopher Peru, MD  05/05/2017, 9:58 AM  Patient ID: Jack Joseph, male   DOB: 01/14/1946, 71 y.o.   MRN: [id number]

## 2017-05-05 NOTE — Plan of Care (Signed)
Problem: Physical Regulation: Goal: Postoperative complications will be avoided or minimized Outcome: Progressing Weaned off Levo today

## 2017-05-05 NOTE — Progress Notes (Signed)
Wasted approximately 200 mL of Fentanyl in sink. Caryl Pina RN witnessed the waste.  Ardath Sax. Ladona Ridgel RN

## 2017-05-05 NOTE — Progress Notes (Signed)
6 Days Post-Op Procedure(s) (LRB): REDO STERNOTOMY (N/A) REPLACEMENT ASCENDING AORTA/ WHEAT PROCEDURE, HYPOTHERMIC CIRCULATORY ARREST AND CARDIOPULMONARY BYPASS, AND USE OF HEMASHIELD PLATINUM WOVEN DOUBLE VELOUR VASCULAR GRAFT 32 MM X 50 CM (N/A) TRANSESOPHAGEAL ECHOCARDIOGRAM (TEE) (N/A) Subjective:  Alert on vent Fever with abundant secretions, gram + cocci- start antibiotics Severe anemia - needs transfusion Good cardiac output- nsr I/O + 1.5 L yesteday- lasix ordered Air leak from pigtail- zero from lower 87F tube No BNM Objective: Vital signs in last 24 hours: Temp:  [97.9 F (36.6 C)-100.6 F (38.1 C)] 99.9 F (37.7 C) (06/24 0700) Pulse Rate:  [70-91] 70 (06/24 0700) Cardiac Rhythm: Normal sinus rhythm (06/24 0800) Resp:  [19-25] 19 (06/24 0700) BP: (89-119)/(37-58) 107/52 (06/24 0700) SpO2:  [89 %-100 %] 96 % (06/24 0818) Arterial Line BP: (84-135)/(34-63) 107/40 (06/24 0600) FiO2 (%):  [40 %] 40 % (06/24 0818) Weight:  [157 lb 13.6 oz (71.6 kg)] 157 lb 13.6 oz (71.6 kg) (06/24 0508)  Hemodynamic parameters for last 24 hours: CVP:  [5 mmHg-10 mmHg] 10 mmHg  Intake/Output from previous day: 06/23 0701 - 06/24 0700 In: 2948 [I.V.:1508; NG/GT:1240; IV Piggyback:200] Out: 1555 [Urine:1405; Emesis/NG output:100; Chest Tube:50] Intake/Output this shift: Total I/O In: 122 [I.V.:32; NG/GT:40; IV Piggyback:50] Out: 245 [Urine:185; Chest Tube:60]       Exam    General- alert and comfortable   Lungs- clear without rales, wheezes   Cor- regular rate and rhythm, no murmur , gallop   Abdomen- soft, non-tender   Extremities - warm, non-tender, minimal edema   Neuro- oriented, appropriate, no focal weakness   Lab Results:  Recent Labs  05/04/17 0429 05/04/17 1935 05/05/17 0337  WBC 7.1  --  5.3  HGB 7.9* 7.1* 7.0*  HCT 23.2* 21.0* 21.4*  PLT 188  --  210   BMET:  Recent Labs  05/04/17 0429 05/04/17 1935 05/05/17 0337  NA 128* 134* 129*  K 3.4* 3.8 3.7   CL 95* 92* 96*  CO2 27  --  27  GLUCOSE 145* 152* 122*  BUN 14 20 22*  CREATININE 0.77 0.80 0.77  CALCIUM 7.5*  --  7.7*    PT/INR:  Recent Labs  05/05/17 0337  LABPROT 24.6*  INR 2.17   ABG    Component Value Date/Time   PHART 7.444 05/05/2017 0422   HCO3 28.7 (H) 05/05/2017 0422   TCO2 30 05/05/2017 0422   ACIDBASEDEF 3.0 (H) 05/01/2017 0121   O2SAT 96.0 05/05/2017 0422   CBG (last 3)   Recent Labs  05/04/17 2355 05/05/17 0339 05/05/17 0730  GLUCAP 133* 119* 136*    Assessment/Plan: S/P Procedure(s) (LRB): REDO STERNOTOMY (N/A) REPLACEMENT ASCENDING AORTA/ WHEAT PROCEDURE, HYPOTHERMIC CIRCULATORY ARREST AND CARDIOPULMONARY BYPASS, AND USE OF HEMASHIELD PLATINUM WOVEN DOUBLE VELOUR VASCULAR GRAFT 32 MM X 50 CM (N/A) TRANSESOPHAGEAL ECHOCARDIOGRAM (TEE) (N/A) resume coumadin Transfuse Antibiotics diuresis   LOS: 6 days    Jack Joseph 05/05/2017

## 2017-05-05 NOTE — Progress Notes (Signed)
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  Underwent ascending aortic replacement/wheat procedure with CABG.  Was progressing well post op and ambulating in hall.  However just after midnight 6/20 had VT arrest with CPR and shock x 3.  Intubated during ACLS, small R ptx noted post CPR and CT placed by CVTS.  PCCM consulted for vent management.   SUBJECTIVE:  Weaning No distress but does look like flail chest    VITAL SIGNS: BP (!) 107/52   Pulse 70   Temp 99.9 F (37.7 C) (Oral)   Resp 19   Ht 5\' 10"  (1.778 m)   Wt 157 lb 13.6 oz (71.6 kg)   SpO2 96%   BMI 22.65 kg/m   HEMODYNAMICS: CVP:  [5 mmHg-10 mmHg] 10 mmHg  VENTILATOR SETTINGS: Vent Mode: PSV;CPAP FiO2 (%):  [40 %] 40 % Set Rate:  [20 bmp] 20 bmp Vt Set:  [580 mL] 580 mL PEEP:  [5 cmH20] 5 cmH20 Pressure Support:  [12 cmH20] 12 cmH20 Plateau Pressure:  [12 cmH20-19 cmH20] 18 cmH20  INTAKE / OUTPUT: I/O last 3 completed shifts: In: 4323.5 [I.V.:2353.5; NG/GT:1720; IV Piggyback:250] Out: 2420 [Urine:2200; Emesis/NG output:100; Chest Tube:120]  PHYSICAL EXAMINATION: Physical Exam  Constitutional: He is oriented to person, place, and time. He appears well-developed and well-nourished. He is intubated.  HENT:  Head: Normocephalic and atraumatic.  Mouth/Throat: No oropharyngeal exudate.  Eyes: Conjunctivae are normal. Pupils are equal, round, and reactive to light.  Neck: Normal range of motion. Neck supple.  Cardiovascular: Normal rate and regular rhythm.  Exam reveals no  friction rub.   No murmur heard. Pulmonary/Chest: Accessory muscle usage present. He is intubated. He has decreased breath sounds in the right lower field.  Flail chest respiratory pattern Left anterior chest tube w/ 1/7 airleak   Abdominal: Soft. Normal appearance and bowel sounds are normal. He exhibits no distension. There is no tenderness.  Genitourinary:  Genitourinary Comments: Clear yellow urine   Musculoskeletal: Normal range of motion.  Neurological: He is alert and oriented to person, place, and time. He has normal strength.  Skin: Skin is warm, dry and intact. He is not diaphoretic. No pallor.  Psychiatric: He has a normal mood and affect. Cognition and memory are normal.    LABS:  BMET  Recent Labs Lab 05/03/17 0429 05/04/17 0429 05/04/17 1935 05/05/17 0337  NA 131* 128* 134* 129*  K 3.9 3.4* 3.8 3.7  CL 97* 95* 92* 96*  CO2 27 27  --  27  BUN 10 14 20  22*  CREATININE 0.80 0.77 0.80 0.77  GLUCOSE 111* 145* 152* 122*    Electrolytes  Recent Labs Lab 05/03/17 0429 05/03/17 1845 05/04/17 0429 05/05/17 0337  CALCIUM 7.8*  --  7.5* 7.7*  MG 1.7 2.3 2.0 1.9  PHOS  --  2.9 2.7  --     CBC  Recent Labs Lab 05/03/17 0429 05/04/17 0429 05/04/17 1935 05/05/17 0337  WBC 9.2 7.1  --  5.3  HGB 8.0* 7.9* 7.1* 7.0*  HCT 23.9* 23.2* 21.0* 21.4*  PLT 153 188  --  210    Coag's  Recent Labs Lab 04/29/17 1502  05/03/17 0725 05/04/17 0429 05/05/17 0337  APTT 34  --   --   --   --   INR 1.54  < > 3.49 3.89 2.17  < > = values in this interval not displayed.  Sepsis Markers  Recent Labs Lab 05/01/17 0755  LATICACIDVEN 1.2    ABG  Recent Labs Lab 05/03/17 0007 05/03/17 1219 05/05/17 0422  PHART 7.466* 7.484* 7.444  PCO2ART 36.3 38.3 42.2  PO2ART 60.0* 165.0* 80.0*    Liver Enzymes  Recent Labs Lab 05/01/17 0115 05/02/17 0047 05/04/17 0429  AST 42* 36 22  ALT 18 20 18   ALKPHOS 30* 37* 49  BILITOT 0.9 0.7 0.8  ALBUMIN 3.3* 3.0*  2.3*    Cardiac Enzymes  Recent Labs Lab 05/01/17 0737 05/01/17 1314 05/01/17 2005  TROPONINI 0.46* 0.44* 0.43*    Glucose  Recent Labs Lab 05/04/17 0738 05/04/17 1129 05/04/17 1522 05/04/17 2355 05/05/17 0339 05/05/17 0730  GLUCAP 111* 114* 147* 133* 119* 136*    Imaging Dg Chest Port 1 View  Result Date: 05/05/2017 CLINICAL DATA:  Endotracheal tube EXAM: PORTABLE CHEST 1 VIEW COMPARISON:  05/04/2017 FINDINGS: Two LEFT chest tubes in place. Small apical pneumothorax measures 10 mm from the apical chest wall similar to 12 mm on prior. Extensive subcutaneous gas along the RIGHT chest wall unchanged. Endotracheal tube, NG tube, and PICC line unchanged. LEFT basilar atelectasis. IMPRESSION: 1. Small RIGHT apical pneumothorax with 2 chest tubes in place. 2. No significant change.  Stable support apparatus. 3. LEFT basilar atelectasis. These results will be called to the ordering clinician or representative by the Radiologist Assistant, and communication documented in the PACS or zVision Dashboard. Electronically Signed   By: Genevive Bi M.D.   On: 05/05/2017 08:20     STUDIES:  2D echo 6/20>>> - Left ventricle: The cavity size was normal. Wall thickness was   normal. Systolic function was severely reduced. The estimated   ejection fraction was in the range of 10% to 15%. Doppler   parameters are consistent with abnormal left ventricular   relaxation (grade 1 diastolic dysfunction). - Aortic valve: A mechanical prosthesis was present. - Mitral valve: There was moderate regurgitation. - Left atrium: The atrium was mildly dilated. - Right ventricle: Systolic function was severely reduced. - Right atrium: The atrium was mildly dilated. - Tricuspid valve: There was mild-moderate regurgitation. - Pericardium, extracardiac: There was a left pleural effusion.  CULTURES: Sputum 6/24 gpc>>>  ANTIBIOTICS: vanc 6/24>>> Zosyn 6/24>>>  SIGNIFICANT  EVENTS: 6/20  LINES/TUBES: ETT 6/20>>> 6/21; 6/21 >> 6/24 R chest tube #1 6/20>>> R chest tube #2 6/21 >>     ASSESSMENT / PLAN:  Acute respiratory failure - post VT arrest  Hx COPD  Right pneumothorax (after CPR) status post chest tube 2 Severe left-sided atelectasis 6/20 suspect that this was d/t poor cough mechanics  Flail chest  Reintubated 6/21 Suspect this was d/t very poor cough mechanics and plugging  - pcxr personally reviewed: small apical right PTX, [place] edema persists. Basilar atx no worse but certainly much improved from mucous plugging event 6/22 Plan Extubate Focus on pain management and pulm hygiene Cont lasix Add IS and flutter CT management per CVTS, hoping that after he is off positive pressure this may improve some IF re-intubated would get CT non-contrast (to look for rib fractures). Also question about plating to CVTS.  Although  IF re-intubated I think he will need trach   Possible HCAP Prelim cultures w/ gpc clusters Plan Day 1 vanc and zosyn Narrow as indicated   VT arrest 6/20 - unclear etiology.  Acute on chronic CHF - EF 10-15%; Shock - cardiogenic.   Repair thoracic aortic aneurysm 6/18 Hx AVR   Plan amio gtt per cards Cont systemic a/c Weaning nor-epi per cards Repeat echo (timing per cards) Lasix bid ICD will be placed prior to dc   Fluid and electrolyte d/o -persistent hyponatremia  Plan Cont lasix    Protein calorie malnutrition  Plan Cont tubefeeds    Chronic anticoagulation, mechanical valve Anemia of critical illness.  Plan: Cont coumadin    Hyperglycemia   Plan:   ssi   Sedation needs on vent - mental status appears intact post arrest.  Follows commands per RN and family.  Plan:   RAS goal 0 to -1  FAMILY  - Updates:    DVT prophylaxis: coumadin  SUP: ppi  Diet: npo Activity: bedrest  Disposition : icu  DISCUSSION: 71yo male with VT arrest POD#4 thoracic aortic aneurysm repair.  -weaning. Vts are  acceptable Barriers to progress: Flail chest  Risk for recurrent atx Pressor requirements better If re-intubated would get CT chest look for severity of rib fractures ? D/w cvts plating if found Almost certainly would require trach if fails again   My cct 45 minutes.  Simonne Martinet ACNP-BC [facility] Pulmonary/Critical Care Pager # [phone] OR # [phone] if no answer  Attending Note:  71 year old male s/p VT arrest with a flail chest who is weaning well on exam this AM with diffuse crackles.  I reviewed CXR myself, ETT in good position and PTX noted.  Will attempt extubation today, concern is that flail chest may pose an issue.  Will continue volume negative.  Continue vanc/zosyn for now.  PCCM will continue to follow.  The patient is critically ill with multiple organ systems failure and requires high complexity decision making for assessment and support, frequent evaluation and titration of therapies, application of advanced monitoring technologies and extensive interpretation of multiple databases.   Critical Care Time devoted to patient care services described in this note is  35  Minutes. This time reflects time of care of this signee Dr Koren Bound. This critical care time does not reflect procedure time, or teaching time or supervisory time of PA/NP/Med student/Med Resident etc but could involve care discussion time.  Alyson Reedy, M.D. [facility] Pulmonary/Critical Care Medicine. Pager: [phone]. After hours pager: [phone].

## 2017-05-05 NOTE — Progress Notes (Signed)
Dr. Katrinka Blazing (elink) called regarding confusion, anxiety, poor cough effort, decreased voice effort, removal of feeding tube and attempt to get out of bed. No orders for now, will continue to monitor.

## 2017-05-05 NOTE — Progress Notes (Signed)
At 2045, pt desated into 70s, placed on Venti mask 55%, encouraged coughing with limited success. At 2100, pt NT suctioned, copious secretions, sats and color improved immediately, pt remains anxious, restless, and picky, will continue to montior and watch closely.

## 2017-05-05 NOTE — Progress Notes (Signed)
eLink Physician-Brief Progress Note Patient Name: Jack Joseph DOB: [birth year] MRN: [id number]   Date of Service  05/05/2017  HPI/Events of Note  Camera check postextubation. Patient recumbent and comfortable watching his laptop computer. Respiratory rate 25 & saturation 90% on nasal cannula. Previously desaturated and required Venturi mask. Nurse notified of increased secretions & marginal cough.   eICU Interventions  1. Continuing scheduled Xopenex nebulized 2. Starting guaifenesin by mouth twice a day 3. Starting 3% hypertonic saline nebulized twice a day 4. Continuing pulmonary toilet with flutter valve and incentive spirometry 5. Out of bed as much as possible if okay with primary service      Intervention Category Major Interventions: Respiratory failure - evaluation and management  Lawanda Cousins 05/05/2017, 11:54 PM

## 2017-05-05 NOTE — Progress Notes (Addendum)
6 Days Post-Op Procedure(s) (LRB): REDO STERNOTOMY (N/A) REPLACEMENT ASCENDING AORTA/ WHEAT PROCEDURE, HYPOTHERMIC CIRCULATORY ARREST AND CARDIOPULMONARY BYPASS, AND USE OF HEMASHIELD PLATINUM WOVEN DOUBLE VELOUR VASCULAR GRAFT 32 MM X 50 CM (N/A) TRANSESOPHAGEAL ECHOCARDIOGRAM (TEE) (N/A) Subjective: Up in chair today after being extubated midday Neuro intact O2 sats maintained greater than 92% Excellent diuresis Sinus rhythm Hemoglobin up to 9 g after transfusion 2 units packed cells Objective: Vital signs in last 24 hours: Temp:  [97.6 F (36.4 C)-100.6 F (38.1 C)] 98.2 F (36.8 C) (06/24 1516) Pulse Rate:  [69-110] 92 (06/24 1516) Cardiac Rhythm: Sinus tachycardia (06/24 1200) Resp:  [14-37] 24 (06/24 1500) BP: (81-127)/(37-85) 119/65 (06/24 1516) SpO2:  [83 %-100 %] 88 % (06/24 1445) Arterial Line BP: (72-119)/(34-93) 116/62 (06/24 1500) FiO2 (%):  [40 %] 40 % (06/24 1140) Weight:  [157 lb 13.6 oz (71.6 kg)] 157 lb 13.6 oz (71.6 kg) (06/24 0508)  Hemodynamic parameters for last 24 hours: CVP:  [5 mmHg-10 mmHg] 9 mmHg  Intake/Output from previous day: 06/23 0701 - 06/24 0700 In: 2948 [I.V.:1508; NG/GT:1240; IV Piggyback:200] Out: 1555 [Urine:1405; Emesis/NG output:100; Chest Tube:50] Intake/Output this shift: Total I/O In: 1390.8 [I.V.:250.8; Blood:670; NG/GT:220; IV Piggyback:250] Out: 3167 [Urine:3060; Stool:2; Chest Tube:105]    Lab Results:  Recent Labs  05/04/17 0429  05/05/17 0337 05/05/17 1618  WBC 7.1  --  5.3  --   HGB 7.9*  < > 7.0* 9.5*  HCT 23.2*  < > 21.4* 28.0*  PLT 188  --  210  --   < > = values in this interval not displayed. BMET:  Recent Labs  05/04/17 0429  05/05/17 0337 05/05/17 1618  NA 128*  < > 129* 133*  K 3.4*  < > 3.7 4.3  CL 95*  < > 96* 92*  CO2 27  --  27  --   GLUCOSE 145*  < > 122* 125*  BUN 14  < > 22* 28*  CREATININE 0.77  < > 0.77 0.90  CALCIUM 7.5*  --  7.7*  --   < > = values in this interval not displayed.   PT/INR:  Recent Labs  05/05/17 0337  LABPROT 24.6*  INR 2.17   ABG    Component Value Date/Time   PHART 7.444 05/05/2017 0422   HCO3 28.7 (H) 05/05/2017 0422   TCO2 32 05/05/2017 1618   ACIDBASEDEF 3.0 (H) 05/01/2017 0121   O2SAT 96.0 05/05/2017 0422   CBG (last 3)   Recent Labs  05/05/17 0730 05/05/17 1123 05/05/17 1542  GLUCAP 136* 129* 97    Assessment/Plan: S/P Procedure(s) (LRB): REDO STERNOTOMY (N/A) REPLACEMENT ASCENDING AORTA/ WHEAT PROCEDURE, HYPOTHERMIC CIRCULATORY ARREST AND CARDIOPULMONARY BYPASS, AND USE OF HEMASHIELD PLATINUM WOVEN DOUBLE VELOUR VASCULAR GRAFT 32 MM X 50 CM (N/A) TRANSESOPHAGEAL ECHOCARDIOGRAM (TEE) (N/A) P.m. labs satisfactory Continue amiodarone and renal dose dopamine Lasix 40 IV twice a day Swallow assessment in a.m. to prevent aspiration  LOS: 6 days    Jack Joseph 05/05/2017

## 2017-05-05 NOTE — Progress Notes (Signed)
Patient ID: Jack Joseph, male   DOB: 1946/06/22, 71 y.o.   MRN: [id number]    Advanced Heart Failure Rounding Note   Subjective:    6/21 extubated but re-intubated that night. Remains on norepi 8.5 mcg + dopamine 3 mcg.  He is on amiodarone gtt.   SBT this morning.   No VT.  Telemetry yesterday concerning for atrial flutter but ECG probably NSR.  NSR on telemetry today.    CVP 10 today, weight up.  Co-ox 68%. Hemoglobin lower without overt bleeding.   CXR with LLL atelectasis, small right apical PTX.   Objective:   Weight Range:  Vital Signs:   Temp:  [97.9 F (36.6 C)-100.6 F (38.1 C)] 99.9 F (37.7 C) (06/24 0700) Pulse Rate:  [70-91] 70 (06/24 0700) Resp:  [19-25] 19 (06/24 0700) BP: (89-119)/(37-58) 107/52 (06/24 0700) SpO2:  [89 %-100 %] 96 % (06/24 0818) Arterial Line BP: (84-139)/(34-63) 107/40 (06/24 0600) FiO2 (%):  [40 %] 40 % (06/24 0818) Weight:  [157 lb 13.6 oz (71.6 kg)] 157 lb 13.6 oz (71.6 kg) (06/24 0508) Last BM Date: 04/28/17  Weight change: Filed Weights   05/03/17 0500 05/04/17 0500 05/05/17 0508  Weight: 153 lb 7 oz (69.6 kg) 152 lb 12.5 oz (69.3 kg) 157 lb 13.6 oz (71.6 kg)    Intake/Output:   Intake/Output Summary (Last 24 hours) at 05/05/17 0900 Last data filed at 05/05/17 0800  Gross per 24 hour  Intake          2785.86 ml  Output             1725 ml  Net          1060.86 ml     Physical Exam: CVP 10  General:  Awake on vent  HEENT:  ETT  Neck: supple. JVP difficult. Carotids 2+ bilat; no bruits. No lymphadenopathy or thryomegaly appreciated. Cor: PMI nondisplaced. RRR, Mechanical S2.  Lungs: Decreased on the right. CT x2 on right Abdomen: soft, nontender, nondistended. No hepatosplenomegaly. No bruits or masses. Good bowel sounds. Extremities: no cyanosis, clubbing, R and LLE SCDs. No edema.  Neuro: intubated Awake on vent.    Telemetry: Personally reviewed, NSR, no VT.   Labs: Basic Metabolic Panel:  Recent  Labs Lab 05/01/17 0115  05/02/17 0047 05/02/17 0200 05/03/17 0429 05/03/17 1845 05/04/17 0429 05/04/17 1935 05/05/17 0337  NA 134*  --  131*  --  131*  --  128* 134* 129*  K 3.7  --  3.8  --  3.9  --  3.4* 3.8 3.7  CL 101  --  97*  --  97*  --  95* 92* 96*  CO2 22  --  25  --  27  --  27  --  27  GLUCOSE 179*  --  148*  --  111*  --  145* 152* 122*  BUN 12  --  11  --  10  --  14 20 22*  CREATININE 1.31*  --  0.94  --  0.80  --  0.77 0.80 0.77  CALCIUM 8.0*  --  7.7*  --  7.8*  --  7.5*  --  7.7*  MG 1.8  < >  --  1.7 1.7 2.3 2.0  --  1.9  PHOS  --   --   --   --   --  2.9 2.7  --   --   < > = values in this interval not displayed.  Liver Function Tests:  Recent Labs Lab 05/01/17 0115 05/02/17 0047 05/04/17 0429  AST 42* 36 22  ALT '18 20 18  '$ ALKPHOS 30* 37* 49  BILITOT 0.9 0.7 0.8  PROT 5.1* 5.0* 4.8*  ALBUMIN 3.3* 3.0* 2.3*   No results for input(s): LIPASE, AMYLASE in the last 168 hours. No results for input(s): AMMONIA in the last 168 hours.  CBC:  Recent Labs Lab 05/01/17 0115 05/02/17 0047 05/03/17 0429 05/04/17 0429 05/04/17 1935 05/05/17 0337  WBC 16.1* 11.9* 9.2 7.1  --  5.3  HGB 8.5* 8.8* 8.0* 7.9* 7.1* 7.0*  HCT 25.6* 25.1* 23.9* 23.2* 21.0* 21.4*  MCV 93.1 89.3 91.2 91.7  --  91.5  PLT 109* 115* 153 188  --  210    Cardiac Enzymes:  Recent Labs Lab 05/01/17 0344 05/01/17 0737 05/01/17 1314 05/01/17 2005  CKTOTAL 474*  --   --   --   CKMB 16.7*  --   --   --   TROPONINI  --  0.46* 0.44* 0.43*    BNP: BNP (last 3 results) No results for input(s): BNP in the last 8760 hours.  ProBNP (last 3 results) No results for input(s): PROBNP in the last 8760 hours.    Other results:  Imaging: Dg Chest Port 1 View  Result Date: 05/05/2017 CLINICAL DATA:  Endotracheal tube EXAM: PORTABLE CHEST 1 VIEW COMPARISON:  05/04/2017 FINDINGS: Two LEFT chest tubes in place. Small apical pneumothorax measures 10 mm from the apical chest wall similar  to 12 mm on prior. Extensive subcutaneous gas along the RIGHT chest wall unchanged. Endotracheal tube, NG tube, and PICC line unchanged. LEFT basilar atelectasis. IMPRESSION: 1. Small RIGHT apical pneumothorax with 2 chest tubes in place. 2. No significant change.  Stable support apparatus. 3. LEFT basilar atelectasis. These results will be called to the ordering clinician or representative by the Radiologist Assistant, and communication documented in the PACS or zVision Dashboard. Electronically Signed   By: Suzy Bouchard M.D.   On: 05/05/2017 08:20   Dg Chest Port 1 View  Result Date: 05/04/2017 CLINICAL DATA:  Chest tube. EXAM: PORTABLE CHEST 1 VIEW COMPARISON:  05/03/2017 FINDINGS: Endotracheal tube terminates approximately 4.5 cm above the carina. Enteric tube courses into the left upper abdomen with tip not imaged. Two right-sided pleural catheters remain in place. A right PICC terminates over the lower SVC. Prior aortic valve replacement is noted. The cardiomediastinal silhouette is unchanged. There is a small to moderate size left pleural effusion which has decreased from the prior study with improved aeration of the left lung. No definite pneumothorax is identified. Subcutaneous emphysema remains in the right chest wall and neck. IMPRESSION: 1. Decreased size of left pleural effusion with improved left lung aeration. 2. Support devices as above.  No definite pneumothorax. Electronically Signed   By: Logan Bores M.D.   On: 05/04/2017 07:25   Dg Abd Portable 1v  Result Date: 05/03/2017 CLINICAL DATA:  71 year old male status post feeding tube placement. EXAM: PORTABLE ABDOMEN - 1 VIEW COMPARISON:  Chest radiographs 0628 hours today and earlier. FINDINGS: Portable AP supine view at 1249 hours. Enteric feeding tube loops in the stomach and continues distally, the tip is located just to the right of midline at the level of the gastric antrum. Visible bowel gas pattern is within normal limits.  Epicardial pacer wires and electrodes project over the epigastrium. Confluent opacity re- demonstrated at the left lung base. IMPRESSION: 1. Enteric tube tip at the gastric  antrum level. Advanced about 6 cm to allow for transit into the duodenum. 2. Nonobstructed bowel-gas pattern. Electronically Signed   By: Genevie Ann M.D.   On: 05/03/2017 13:06     Medications:     Scheduled Medications: . aspirin EC  81 mg Oral Daily   Or  . aspirin  81 mg Per Tube Daily  . bisacodyl  10 mg Oral Daily   Or  . bisacodyl  10 mg Rectal Daily  . chlorhexidine gluconate (MEDLINE KIT)  15 mL Mouth Rinse BID  . Chlorhexidine Gluconate Cloth  6 each Topical Daily  . docusate  200 mg Oral Daily  . feeding supplement (PRO-STAT SUGAR FREE 64)  30 mL Per Tube TID  . furosemide  20 mg Intravenous Once  . furosemide  40 mg Intravenous BID  . insulin aspart  0-24 Units Subcutaneous Q4H  . levalbuterol  0.63 mg Nebulization Q6H  . magic mouthwash  5 mL Oral TID  . mouth rinse  15 mL Mouth Rinse 10 times per day  . metoCLOPramide (REGLAN) injection  10 mg Intravenous Q6H  . pantoprazole (PROTONIX) IV  40 mg Intravenous Q24H  . potassium chloride  40 mEq Oral Once  . sodium chloride flush  10-40 mL Intracatheter Q12H  . sodium chloride flush  3 mL Intravenous Q12H  . sorbitol  30 mL Oral q morning - 10a  . Warfarin - Physician Dosing Inpatient   Does not apply q1800    Infusions: . amiodarone 30 mg/hr (05/05/17 0605)  . dexmedetomidine (PRECEDEX) IV infusion Stopped (05/02/17 1136)  . DOPamine 3 mcg/kg/min (05/05/17 0400)  . feeding supplement (VITAL 1.5 CAL) 1,000 mL (05/05/17 0400)  . fentaNYL infusion INTRAVENOUS 25 mcg/hr (05/05/17 0400)  . norepinephrine (LEVOPHED) Adult infusion 8.5 mcg/min (05/05/17 0800)    PRN Medications: midazolam, morphine injection, ondansetron (ZOFRAN) IV, sodium chloride flush, sodium chloride flush   Assessment:   1. VT arrest: EP folllowing. No VT overnight.  -  Continue IV amiodarone while intubated.  - Plan ICD prior to d/c 2. 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  EF now down to 10-15% from 25-30% pre-op. Suspect acute drop related to recent cardiac surgery, bypass, and arrest. Plan to repeat ECHO next week.  Remains on dopamine 3 mcg + norepi 8.5 mcg.  CVP 10 with co-ox 68%.   - Lasix 40 mg IV bid.  - Will try to be more aggressive with weaning norepinephrine today, good BP currently.   3. 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  4. Acute hypoxic respiratory failure: Has severe COPD, also with considerable LLL volume loss/atelectasis. Extubated 6/21 but re-intubated.  - Wean vent per CCM (SBT this morning), ?bronchoscopy with LLL volume loss if unable to extubate.   5. R PTX: R apical pneumo with chest tube x2 in place. TCTS managing  6. 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   Length of Stay: [place]  05/05/2017, 9:00 AM  Advanced Heart Failure Team Pager [phone] (M-F; 7a - 4p)  Please contact [facility] for night-coverage after hours (4p -7a ) and weekends on [email or web address]

## 2017-05-05 NOTE — Procedures (Signed)
Extubation Procedure Note  Patient Details:   Name: Kaniel Botley DOB: 1946/07/29 MRN: [id number]   Airway Documentation:  Airway 7.5 mm (Active)  Secured at (cm) 21 cm 05/05/2017  8:18 AM  Measured From Lips 05/05/2017  8:18 AM  Secured Location Center 05/05/2017  8:18 AM  Secured By [employer] 05/05/2017  8:18 AM  Tube Holder Repositioned Yes 05/05/2017  8:18 AM  Cuff Pressure (cm H2O) 28 cm H2O 05/05/2017  8:18 AM  Site Condition Dry 05/05/2017  3:20 AM    Evaluation  O2 sats: stable throughout and currently acceptable Complications: No apparent complications Patient did tolerate procedure well. Bilateral Breath Sounds: Diminished, Other (Comment) (coarse)   Yes  Antoine Poche 05/05/2017, 12:05 PM

## 2017-05-06 ENCOUNTER — Inpatient Hospital Stay (HOSPITAL_COMMUNITY): Payer: Medicare Other

## 2017-05-06 LAB — POCT I-STAT 3, ART BLOOD GAS (G3+)
Acid-Base Excess: 7 mmol/L — ABNORMAL HIGH (ref 0.0–2.0)
Bicarbonate: 32.6 mmol/L — ABNORMAL HIGH (ref 20.0–28.0)
O2 Saturation: 89 %
Patient temperature: 98.7
TCO2: 34 mmol/L (ref 0–100)
pCO2 arterial: 51.2 mmHg — ABNORMAL HIGH (ref 32.0–48.0)
pH, Arterial: 7.412 (ref 7.350–7.450)
pO2, Arterial: 57 mmHg — ABNORMAL LOW (ref 83.0–108.0)

## 2017-05-06 LAB — BASIC METABOLIC PANEL
ANION GAP: 13 (ref 5–15)
BUN: 24 mg/dL — ABNORMAL HIGH (ref 6–20)
CO2: 29 mmol/L (ref 22–32)
Calcium: 7.8 mg/dL — ABNORMAL LOW (ref 8.9–10.3)
Chloride: 93 mmol/L — ABNORMAL LOW (ref 101–111)
Creatinine, Ser: 0.95 mg/dL (ref 0.61–1.24)
Glucose, Bld: 108 mg/dL — ABNORMAL HIGH (ref 65–99)
POTASSIUM: 3.6 mmol/L (ref 3.5–5.1)
SODIUM: 135 mmol/L (ref 135–145)

## 2017-05-06 LAB — COOXEMETRY PANEL
CARBOXYHEMOGLOBIN: 1.3 % (ref 0.5–1.5)
Methemoglobin: 0.9 % (ref 0.0–1.5)
O2 SAT: 69.7 %
Total hemoglobin: 13.1 g/dL (ref 12.0–16.0)

## 2017-05-06 LAB — CBC
HEMATOCRIT: 28 % — AB (ref 39.0–52.0)
HEMOGLOBIN: 9.3 g/dL — AB (ref 13.0–17.0)
MCH: 30.1 pg (ref 26.0–34.0)
MCHC: 33.2 g/dL (ref 30.0–36.0)
MCV: 90.6 fL (ref 78.0–100.0)
Platelets: 230 10*3/uL (ref 150–400)
RBC: 3.09 MIL/uL — ABNORMAL LOW (ref 4.22–5.81)
RDW: 15.8 % — ABNORMAL HIGH (ref 11.5–15.5)
WBC: 5.2 10*3/uL (ref 4.0–10.5)

## 2017-05-06 LAB — CULTURE, RESPIRATORY W GRAM STAIN
Culture: NORMAL
Special Requests: NORMAL

## 2017-05-06 LAB — POCT I-STAT, CHEM 8
BUN: 25 mg/dL — ABNORMAL HIGH (ref 6–20)
Calcium, Ion: 1.06 mmol/L — ABNORMAL LOW (ref 1.15–1.40)
Chloride: 92 mmol/L — ABNORMAL LOW (ref 101–111)
Creatinine, Ser: 0.9 mg/dL (ref 0.61–1.24)
Glucose, Bld: 238 mg/dL — ABNORMAL HIGH (ref 65–99)
HCT: 27 % — ABNORMAL LOW (ref 39.0–52.0)
Hemoglobin: 9.2 g/dL — ABNORMAL LOW (ref 13.0–17.0)
Potassium: 3.5 mmol/L (ref 3.5–5.1)
Sodium: 132 mmol/L — ABNORMAL LOW (ref 135–145)
TCO2: 31 mmol/L (ref 0–100)

## 2017-05-06 LAB — GLUCOSE, CAPILLARY
Glucose-Capillary: 103 mg/dL — ABNORMAL HIGH (ref 65–99)
Glucose-Capillary: 117 mg/dL — ABNORMAL HIGH (ref 65–99)
Glucose-Capillary: 121 mg/dL — ABNORMAL HIGH (ref 65–99)
Glucose-Capillary: 129 mg/dL — ABNORMAL HIGH (ref 65–99)
Glucose-Capillary: 141 mg/dL — ABNORMAL HIGH (ref 65–99)
Glucose-Capillary: 160 mg/dL — ABNORMAL HIGH (ref 65–99)
Glucose-Capillary: 80 mg/dL (ref 65–99)

## 2017-05-06 LAB — PROTIME-INR
INR: 1.51
Prothrombin Time: 18.3 seconds — ABNORMAL HIGH (ref 11.4–15.2)

## 2017-05-06 LAB — MAGNESIUM: Magnesium: 1.7 mg/dL (ref 1.7–2.4)

## 2017-05-06 LAB — PROCALCITONIN: PROCALCITONIN: 1.04 ng/mL

## 2017-05-06 MED ORDER — POTASSIUM CHLORIDE 10 MEQ/50ML IV SOLN
10.0000 meq | INTRAVENOUS | Status: AC
  Administered 2017-05-06 (×3): 10 meq via INTRAVENOUS
  Filled 2017-05-06 (×3): qty 50

## 2017-05-06 MED ORDER — PRO-STAT SUGAR FREE PO LIQD
30.0000 mL | Freq: Two times a day (BID) | ORAL | Status: DC
  Administered 2017-05-06 – 2017-05-13 (×14): 30 mL
  Filled 2017-05-06 (×14): qty 30

## 2017-05-06 MED ORDER — POTASSIUM CHLORIDE 10 MEQ/50ML IV SOLN
10.0000 meq | INTRAVENOUS | Status: AC
  Administered 2017-05-06 (×3): 10 meq via INTRAVENOUS
  Filled 2017-05-06 (×2): qty 50

## 2017-05-06 MED ORDER — POTASSIUM CHLORIDE 10 MEQ/50ML IV SOLN
10.0000 meq | INTRAVENOUS | Status: AC
  Administered 2017-05-06 (×3): 10 meq via INTRAVENOUS

## 2017-05-06 MED ORDER — MAGNESIUM SULFATE 2 GM/50ML IV SOLN
2.0000 g | Freq: Once | INTRAVENOUS | Status: AC
  Administered 2017-05-06: 2 g via INTRAVENOUS
  Filled 2017-05-06: qty 50

## 2017-05-06 MED ORDER — ACETYLCYSTEINE 10 % IN SOLN
2.0000 mL | Freq: Four times a day (QID) | RESPIRATORY_TRACT | Status: DC
  Administered 2017-05-06: 2 mL via RESPIRATORY_TRACT
  Filled 2017-05-06 (×5): qty 2

## 2017-05-06 MED ORDER — POTASSIUM CHLORIDE 10 MEQ/50ML IV SOLN
INTRAVENOUS | Status: AC
  Administered 2017-05-06: 10 meq
  Filled 2017-05-06: qty 150

## 2017-05-06 MED ORDER — VITAL 1.5 CAL PO LIQD
1000.0000 mL | ORAL | Status: DC
  Administered 2017-05-06: 1000 mL

## 2017-05-06 MED ORDER — ACETYLCYSTEINE 20 % IN SOLN
4.0000 mL | Freq: Four times a day (QID) | RESPIRATORY_TRACT | Status: AC
  Administered 2017-05-06 – 2017-05-07 (×3): 4 mL via RESPIRATORY_TRACT
  Filled 2017-05-06 (×3): qty 4

## 2017-05-06 MED ORDER — VITAL 1.5 CAL PO LIQD
1000.0000 mL | ORAL | Status: DC
  Administered 2017-05-06 – 2017-05-13 (×8): 1000 mL
  Filled 2017-05-06 (×13): qty 1000

## 2017-05-06 MED ORDER — WARFARIN SODIUM 2.5 MG PO TABS
2.5000 mg | ORAL_TABLET | Freq: Every day | ORAL | Status: DC
  Administered 2017-05-06: 2.5 mg via ORAL
  Filled 2017-05-06: qty 1

## 2017-05-06 MED ORDER — ACETAMINOPHEN 160 MG/5ML PO SOLN
650.0000 mg | ORAL | Status: DC | PRN
  Filled 2017-05-06: qty 20.3

## 2017-05-06 MED ORDER — GUAIFENESIN 100 MG/5ML PO SOLN
5.0000 mL | ORAL | Status: DC | PRN
  Administered 2017-05-06 – 2017-05-12 (×2): 100 mg
  Filled 2017-05-06 (×2): qty 5

## 2017-05-06 MED ORDER — PANTOPRAZOLE SODIUM 40 MG PO PACK
40.0000 mg | PACK | Freq: Every day | ORAL | Status: DC
  Administered 2017-05-07 – 2017-05-13 (×7): 40 mg
  Filled 2017-05-06 (×7): qty 20

## 2017-05-06 NOTE — Progress Notes (Signed)
Patient ID: Jack Joseph, male   DOB: 1946-06-20, 71 y.o.   MRN: [id number] TCTS DAILY ICU PROGRESS NOTE                   [address]            [place] [address]          [phone]   7 Days Post-Op Procedure(s) (LRB): REDO STERNOTOMY (N/A) REPLACEMENT ASCENDING AORTA/ WHEAT PROCEDURE, HYPOTHERMIC CIRCULATORY ARREST AND CARDIOPULMONARY BYPASS, AND USE OF HEMASHIELD PLATINUM WOVEN DOUBLE VELOUR VASCULAR GRAFT 32 MM X 50 CM (N/A) TRANSESOPHAGEAL ECHOCARDIOGRAM (TEE) (N/A)  Total Length of Stay:  LOS: 7 days   Subjective: Sitting in chair, awake , knows he is in hospital very soft voive   Objective: Vital signs in last 24 hours: Temp:  [97.6 F (36.4 C)-99.4 F (37.4 C)] 98.3 F (36.8 C) (06/25 0345) Pulse Rate:  [69-117] 85 (06/25 0715) Cardiac Rhythm: Sinus tachycardia;Bundle branch block (06/24 2100) Resp:  [13-37] 26 (06/25 0715) BP: (81-127)/(33-97) 96/56 (06/25 0715) SpO2:  [79 %-100 %] 96 % (06/25 0715) Arterial Line BP: (57-120)/(32-93) 63/56 (06/24 1830) FiO2 (%):  [40 %-55 %] 55 % (06/24 2045) Weight:  [149 lb 14.6 oz (68 kg)] 149 lb 14.6 oz (68 kg) (06/25 0345)  Filed Weights   05/04/17 0500 05/05/17 0508 05/06/17 0345  Weight: 152 lb 12.5 oz (69.3 kg) 157 lb 13.6 oz (71.6 kg) 149 lb 14.6 oz (68 kg)    Weight change: -7 lb 15 oz (-3.6 kg)   Hemodynamic parameters for last 24 hours: CVP:  [2 mmHg-10 mmHg] 5 mmHg  Intake/Output from previous day: 06/24 0701 - 06/25 0700 In: 2242.6 [I.V.:592.6; Blood:670; NG/GT:380; IV Piggyback:600] Out: 4922 [Urine:4660; Stool:2; Chest Tube:260]  Intake/Output this shift: Total I/O In: 50 [IV Piggyback:50] Out: -   Current Meds: Scheduled Meds: . aspirin EC  81 mg Oral Daily   Or  . aspirin  81 mg Per Tube Daily  . bisacodyl  10 mg Oral Daily   Or  . bisacodyl  10 mg Rectal Daily  . chlorhexidine gluconate (MEDLINE KIT)  15 mL Mouth Rinse BID  . Chlorhexidine Gluconate Cloth  6 each  Topical Daily  . docusate  200 mg Oral Daily  . feeding supplement (PRO-STAT SUGAR FREE 64)  30 mL Per Tube TID  . furosemide  40 mg Intravenous BID  . guaiFENesin  600 mg Oral BID  . insulin aspart  0-24 Units Subcutaneous Q4H  . levalbuterol  0.63 mg Nebulization TID  . magic mouthwash  5 mL Oral TID  . mouth rinse  15 mL Mouth Rinse 10 times per day  . metoCLOPramide (REGLAN) injection  10 mg Intravenous Q6H  . pantoprazole (PROTONIX) IV  40 mg Intravenous Q24H  . sodium chloride flush  10-40 mL Intracatheter Q12H  . sodium chloride flush  3 mL Intravenous Q12H  . sodium chloride HYPERTONIC  4 mL Nebulization BID  . warfarin  2 mg Oral q1800  . Warfarin - Physician Dosing Inpatient   Does not apply q1800   Continuous Infusions: . amiodarone 30 mg/hr (05/05/17 2010)  . DOPamine 3 mcg/kg/min (05/05/17 0400)  . feeding supplement (VITAL 1.5 CAL) Stopped (05/05/17 2000)  . norepinephrine (LEVOPHED) Adult infusion 2 mcg/min (05/06/17 0400)  . piperacillin-tazobactam (ZOSYN)  IV Stopped (05/06/17 0700)  . potassium chloride 10 mEq (05/06/17 0714)  . vancomycin Stopped (05/06/17 0130)   PRN Meds:.levalbuterol, morphine injection, ondansetron (  ZOFRAN) IV, sodium chloride flush, sodium chloride flush, sorbitol  General appearance: alert and cooperative Neurologic: intact Heart: regular rate and rhythm, S1, S2 normal, no murmur, click, rub or gallop Lungs: rhonchi bilaterally Abdomen: soft, non-tender; bowel sounds normal; no masses,  no organomegaly Extremities: extremities normal, atraumatic, no cyanosis or edema and Homans sign is negative, no sign of DVT Wound: sternum intact  Lab Results: CBC: Recent Labs  05/05/17 0337 05/05/17 1618 05/06/17 0338  WBC 5.3  --  5.2  HGB 7.0* 9.5* 9.3*  HCT 21.4* 28.0* 28.0*  PLT 210  --  230   BMET:  Recent Labs  05/05/17 0337 05/05/17 1618 05/06/17 0338  NA 129* 133* 135  K 3.7 4.3 3.6  CL 96* 92* 93*  CO2 27  --  29  GLUCOSE  122* 125* 108*  BUN 22* 28* 24*  CREATININE 0.77 0.90 0.95  CALCIUM 7.7*  --  7.8*    CMET: Lab Results  Component Value Date   WBC 5.2 05/06/2017   HGB 9.3 (L) 05/06/2017   HCT 28.0 (L) 05/06/2017   PLT 230 05/06/2017   GLUCOSE 108 (H) 05/06/2017   CHOL 199 01/22/2017   TRIG 71 01/22/2017   HDL 64 01/22/2017   LDLCALC 121 (H) 01/22/2017   ALT 18 05/04/2017   AST 22 05/04/2017   NA 135 05/06/2017   K 3.6 05/06/2017   CL 93 (L) 05/06/2017   CREATININE 0.95 05/06/2017   BUN 24 (H) 05/06/2017   CO2 29 05/06/2017   TSH 5.390 (H) 01/22/2017   INR 1.51 05/06/2017   HGBA1C 5.7 (H) 04/25/2017      PT/INR:  Recent Labs  05/06/17 0338  LABPROT 18.3*  INR 1.51   Radiology: No results found.   Assessment/Plan: S/P Procedure(s) (LRB): REDO STERNOTOMY (N/A) REPLACEMENT ASCENDING AORTA/ WHEAT PROCEDURE, HYPOTHERMIC CIRCULATORY ARREST AND CARDIOPULMONARY BYPASS, AND USE OF HEMASHIELD PLATINUM WOVEN DOUBLE VELOUR VASCULAR GRAFT 32 MM X 50 CM (N/A) TRANSESOPHAGEAL ECHOCARDIOGRAM (TEE) (N/A) Mobilize Diuresis Needs swallow evaluation     [name] 05/06/2017 7:30 AM

## 2017-05-06 NOTE — Progress Notes (Signed)
Nutrition Follow-up  INTERVENTION:   Adjust TF:  Vital 1.5 @ 50 ml/hr (1200 ml/day) via Cortrak 30 ml Prostat BID Provides: 2000 kcal, 111 grams protein, and 916 ml free water.   NUTRITION DIAGNOSIS:   Increased nutrient needs related to wound healing as evidenced by estimated needs. Ongoing.   GOAL:   Patient will meet greater than or equal to 90% of their needs Progressing.   MONITOR:   TF tolerance, I & O's, Labs  REASON FOR ASSESSMENT:   Consult, Ventilator Enteral/tube feeding initiation and management  ASSESSMENT:   Pt with PMH of CHF, severe COPD, AVD, chronic anticoagulation, hyperlipidemia admitted with dilated ascending aorta s/p valve replacement 6/18.  Arrest 6/20.   6/24 extubated Pt pulled tube out, Cortrak replaced today Pt with thick secretions requiring NTS. Pt with flail chest and poor cough. Discussed with RN.    Medications reviewed and include: dulcolax, colace, lasix, reglan Chest tubes with 60 ml out put so far, 260 ml last 24 hours  TF: Vital 1.5 @ 40 ml/hr (960 ml/day) 30 ml Prostat TID Provides: 1740 kcal, 109 grams protein, and 733 ml free water  Diet Order:    NPO  Skin:  Reviewed, no issues  Last BM:  6/24 large  Height:   Ht Readings from Last 1 Encounters:  05/02/17 5\' 10"  (1.778 m)    Weight:   Wt Readings from Last 1 Encounters:  05/06/17 149 lb 14.6 oz (68 kg)    Ideal Body Weight:  75.4 kg  BMI:  Body mass index is 21.51 kg/m.  Estimated Nutritional Needs:   Kcal:  1800-2000  Protein:  100-115 grams  Fluid:  > 1.7 L/day  EDUCATION NEEDS:   No education needs identified at this time  Kendell Bane RD, LDN, CNSC [phone] Pager [phone] After Hours Pager

## 2017-05-06 NOTE — Progress Notes (Signed)
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  Underwent ascending aortic replacement/wheat procedure with CABG.  Was progressing well post op and ambulating in hall.  However just after midnight 6/20 had VT arrest with CPR and shock x 3.  Intubated during ACLS, small R ptx noted post CPR and CT placed by CVTS.  PCCM consulted for vent management.   SUBJECTIVE:  Extubated yesterday Confused, delirious. Has thick seceretions  VITAL SIGNS: BP 136/63   Pulse 98   Temp 98.2 F (36.8 C) (Oral)   Resp (!) 26   Ht 5\' 10"  (1.778 m)   Wt 149 lb 14.6 oz (68 kg)   SpO2 93%   BMI 21.51 kg/m   HEMODYNAMICS: CVP:  [2 mmHg-10 mmHg] 8 mmHg  VENTILATOR SETTINGS: FiO2 (%):  [40 %-55 %] 55 %  INTAKE / OUTPUT: I/O last 3 completed shifts: In: 3796.7 [I.V.:1506.7; Blood:670; NG/GT:920; IV Piggyback:700] Out: 5892 [Urine:5610; Stool:2; Chest Tube:280]  PHYSICAL EXAMINATION: Physical Exam  Constitutional: He appears well-developed.  Mild distress  HENT:  Head: Normocephalic and atraumatic.  Mouth/Throat: No oropharyngeal exudate.  Eyes: Conjunctivae are normal. Pupils are equal, round, and reactive to light.  Neck: Normal range of motion. Neck supple.  Cardiovascular: Normal rate and regular rhythm.  Exam reveals no friction rub.   No murmur heard. Pulmonary/Chest: No accessory muscle usage. He has decreased breath sounds in the right lower field. He has rales.  Left anterior chest tube   Abdominal: Soft. Normal appearance and bowel sounds are normal. He  exhibits no distension. There is no tenderness.  Genitourinary:  Genitourinary Comments: Clear yellow urine   Musculoskeletal: Normal range of motion.  Neurological: He has normal strength.  Mild confusion, moves all extremities  Skin: Skin is warm, dry and intact. He is not diaphoretic. No pallor.  Psychiatric: Cognition and memory are normal.    LABS:  BMET  Recent Labs Lab 05/04/17 0429  05/05/17 0337 05/05/17 1618 05/06/17 0338  NA 128*  < > 129* 133* 135  K 3.4*  < > 3.7 4.3 3.6  CL 95*  < > 96* 92* 93*  CO2 27  --  27  --  29  BUN 14  < > 22* 28* 24*  CREATININE 0.77  < > 0.77 0.90 0.95  GLUCOSE 145*  < > 122* 125* 108*  < > = values in this interval not displayed.  Electrolytes  Recent Labs Lab 05/03/17 1845 05/04/17 0429 05/05/17 0337 05/06/17 0338  CALCIUM  --  7.5* 7.7* 7.8*  MG 2.3 2.0 1.9 1.7  PHOS 2.9 2.7  --   --     CBC  Recent Labs Lab 05/04/17 0429  05/05/17 0337 05/05/17 1618 05/06/17 0338  WBC 7.1  --  5.3  --  5.2  HGB 7.9*  < > 7.0* 9.5* 9.3*  HCT 23.2*  < > 21.4* 28.0* 28.0*  PLT 188  --  210  --  230  < > = values in this interval not displayed.  Coag's  Recent Labs Lab 04/29/17 1502  05/04/17 0429 05/05/17 0337 05/06/17 0338  APTT 34  --   --   --   --   INR 1.54  < > 3.89 2.17 1.51  < > = values in this interval not displayed.  Sepsis Markers  Recent Labs Lab 05/01/17 0755  LATICACIDVEN 1.2    ABG  Recent Labs Lab 05/03/17 1219 05/05/17 0422 05/06/17 0507  PHART 7.484* 7.444 7.412  PCO2ART 38.3 42.2 51.2*  PO2ART 165.0* 80.0* 57.0*    Liver Enzymes  Recent Labs Lab 05/01/17 0115 05/02/17 0047 05/04/17 0429  AST 42* 36 22  ALT 18 20 18   ALKPHOS 30* 37* 49  BILITOT 0.9 0.7 0.8  ALBUMIN 3.3* 3.0* 2.3*    Cardiac Enzymes  Recent Labs Lab 05/01/17 0737 05/01/17 1314 05/01/17 2005  TROPONINI 0.46* 0.44* 0.43*    Glucose  Recent Labs Lab 05/05/17 0730 05/05/17 1123 05/05/17 1542  05/05/17 2056 05/06/17 0005 05/06/17 0359  GLUCAP 136* 129* 97 102* 121* 103*    Imaging Dg Chest Port 1 View  Result Date: 05/06/2017 CLINICAL DATA:  Chest tube placement.  Recent extubation. EXAM: PORTABLE CHEST 1 VIEW COMPARISON:  05/05/2017; 05/04/2017; 05/03/2017 FINDINGS: Grossly unchanged cardiac silhouette and mediastinal contours post median sternotomy and valve replacement. Interval extubation removal of enteric tube. Otherwise, stable position of support apparatus. Grossly unchanged trace right apical pneumothorax. The amount of right lateral chest wall subcutaneous emphysema is grossly unchanged. Suspected slight increase in small left-sided effusion with associated worsening left basilar opacities. Pulmonary vasculature appears slightly less distinct than present examination, more conspicuous within the left lung. Acute minimally displaced fractures involving the left second through fourth ribs. IMPRESSION: 1. Interval extubation and removal of enteric tube. Otherwise, stable positioning of remaining support apparatus with unchanged trace right apical pneumothorax and grossly unchanged amount of right chest wall subcutaneous emphysema. 2. Suspected slight worsening in asymmetric pulmonary edema with development of a left-sided effusion and worsening left basilar opacities, likely atelectasis. Electronically Signed   By: Simonne Come M.D.   On: 05/06/2017 07:34     STUDIES:  2D echo 6/20>>> - Left ventricle: The cavity size was normal. Wall thickness was   normal. Systolic function was severely reduced. The estimated   ejection fraction was in the range of 10% to 15%. Doppler   parameters are consistent with abnormal left ventricular   relaxation (grade 1 diastolic dysfunction). - Aortic valve: A mechanical prosthesis was present. - Mitral valve: There was moderate regurgitation. - Left atrium: The atrium was mildly dilated. - Right ventricle: Systolic function was severely  reduced. - Right atrium: The atrium was mildly dilated. - Tricuspid valve: There was mild-moderate regurgitation. - Pericardium, extracardiac: There was a left pleural effusion.  CULTURES: Sputum 6/24 gpc>>>  ANTIBIOTICS: vanc 6/24>>> Zosyn 6/24>>>  SIGNIFICANT EVENTS: 6/20  LINES/TUBES: ETT 6/20>>> 6/21; 6/21 >> 6/24 R chest tube #1 6/20>>> R chest tube #2 6/21 >>   ASSESSMENT / PLAN:  Acute respiratory failure - post VT arrest  Hx COPD  Right pneumothorax (after CPR) status post chest tube 2 Severe left-sided atelectasis 6/20 suspect that this was d/t poor cough mechanics  Flail chest  Has difficulty clearing secretions. At risk for needing reintubation.  Plan Wean down O2 as tolerated NT suctioning, Continue IS, flutter valve, pulm hygeine Started on mucomyst and 3% saline nebs Guaifenesin via tube CT management per CVTS, hoping that after he is off positive pressure this may improve some IF re-intubated would get CT non-contrast (to  look for rib fractures). Also question about plating to CVTS.  Although IF re-intubated I think he will need trach   Possible HCAP Prelim cultures w/ gpc clusters Plan Day 2 vanc and zosyn Follow cultures, Pct  VT arrest 6/20 - unclear etiology.  Acute on chronic CHF - EF 10-15%; Shock - cardiogenic.   Repair thoracic aortic aneurysm 6/18 Hx AVR   Plan Amio gtt per cards Cont systemic a/c On Norepi, dopamine Lasix  Fluid and electrolyte d/o -persistent hyponatremia  Plan Continue lasix  Protein calorie malnutrition  Plan Failed swallow eval Will need cortrack   Chronic anticoagulation, mechanical valve Anemia of critical illness.  Plan: Continue coumadin  Hyperglycemia   Plan:   SSI  ICU delirium Plan:   Limit sedating meds Can try haldol PRN  FAMILY  - Updates:  No family at bedside  DVT prophylaxis: coumadin  SUP: ppi  Diet: npo Activity: bedrest  Disposition : icu  DISCUSSION: 71yo male with  VT arrest POD#4 thoracic aortic aneurysm repair.  Extubated but still has tenuous resp status Flail chest  Risk for recurrent atx If re-intubated would get CT chest look for severity of rib fractures ? D/w cvts plating if found Almost certainly would require trach if fails again  The patient is critically ill with multiple organ system failure and requires high complexity decision making for assessment and support, frequent evaluation and titration of therapies, advanced monitoring, review of radiographic studies and interpretation of complex data.   Critical Care Time devoted to patient care services, exclusive of separately billable procedures, described in this note is 35 minutes.   Chilton Greathouse MD [facility] Pager [phone] If no answer or after 3pm call: [phone] 05/06/2017, 9:34 AM

## 2017-05-06 NOTE — Progress Notes (Signed)
Cortrak Tube Team Note:  Consult received to place a Cortrak feeding tube.   A 10 F Cortrak tube was placed in the right nare and secured with nasal tape at 83 cm. Per the Cortrak monitor reading the tube tip is postpyloric.   No x-ray is required. RN may begin using tube.   If the tube becomes dislodged please keep the tube and contact the Cortrak team at [email or web address] (password [email or web address]) for replacement.  If after hours and replacement cannot be delayed, place a NG tube and confirm placement with an abdominal x-ray.   Betsey Holiday MS, RD, LDN Pager #- [phone]

## 2017-05-06 NOTE — Progress Notes (Signed)
Patient ID: Jack Joseph, male   DOB: 05-Aug-1946, 71 y.o.   MRN: [id number]    Advanced Heart Failure Rounding Note   Subjective:    6/21 extubated but re-intubated that night. Yesterday he was extubated to 12 liters.   Remains on norepi 2 mcg + dopamaine 3 mcg + amio drip. Todays CO-OX is 70%. CVP 8-9.    Whispering hard to understand. Denies pain.    Objective:   Weight Range:  Vital Signs:   Temp:  [97.6 F (36.4 C)-99.4 F (37.4 C)] 98.2 F (36.8 C) (06/25 0715) Pulse Rate:  [73-117] 86 (06/25 0745) Resp:  [13-37] 23 (06/25 0745) BP: (81-127)/(33-97) 106/49 (06/25 0745) SpO2:  [79 %-100 %] 91 % (06/25 0753) Arterial Line BP: (57-120)/(32-93) 63/56 (06/24 1830) FiO2 (%):  [40 %-55 %] 55 % (06/24 2045) Weight:  [149 lb 14.6 oz (68 kg)] 149 lb 14.6 oz (68 kg) (06/25 0345) Last BM Date: 05/05/17  Weight change: Filed Weights   05/04/17 0500 05/05/17 0508 05/06/17 0345  Weight: 152 lb 12.5 oz (69.3 kg) 157 lb 13.6 oz (71.6 kg) 149 lb 14.6 oz (68 kg)    Intake/Output:   Intake/Output Summary (Last 24 hours) at 05/06/17 0835 Last data filed at 05/06/17 0714  Gross per 24 hour  Intake          2170.63 ml  Output             4677 ml  Net         -2506.37 ml     Physical Exam: CVP 8-9 General:  Sitting in the chair. Weak and elderly. No resp difficulty HEENT: normal Neck: supple. JVP 8-9.  Extensive SQ crepitus on right Carotids 2+ bilat; no bruits. No lymphadenopathy or thryomegaly appreciated. Cor: PMI nondisplaced. Distant HS Regular rate & rhythm. No rubs, gallops or murmurs. Sternal incision approximated.  Lungs: Rhonchi throughout on 12 liters oxygen. CTx2 on right.  Abdomen: soft, nontender, nondistended. No hepatosplenomegaly. No bruits or masses. Good bowel sounds. Extremities: no cyanosis, clubbing, rash, edema Neuro: alert. Whispering hard to understand. MAE.    Telemetry: Personally reviewed NSR 80s.    Labs: Basic Metabolic Panel:  Recent  Labs Lab 05/02/17 0047  05/03/17 0429 05/03/17 1845 05/04/17 0429 05/04/17 1935 05/05/17 0337 05/05/17 1618 05/06/17 0338  NA 131*  --  131*  --  128* 134* 129* 133* 135  K 3.8  --  3.9  --  3.4* 3.8 3.7 4.3 3.6  CL 97*  --  97*  --  95* 92* 96* 92* 93*  CO2 25  --  27  --  27  --  27  --  29  GLUCOSE 148*  --  111*  --  145* 152* 122* 125* 108*  BUN 11  --  10  --  14 20 22* 28* 24*  CREATININE 0.94  --  0.80  --  0.77 0.80 0.77 0.90 0.95  CALCIUM 7.7*  --  7.8*  --  7.5*  --  7.7*  --  7.8*  MG  --   < > 1.7 2.3 2.0  --  1.9  --  1.7  PHOS  --   --   --  2.9 2.7  --   --   --   --   < > = values in this interval not displayed.  Liver Function Tests:  Recent Labs Lab 05/01/17 0115 05/02/17 0047 05/04/17 0429  AST 42* 36 22  ALT  _0 ALKPHOS 30* 37* 49  BILITOT 0.9 0.7 0.8  PROT 5.1* 5.0* 4.8*  ALBUMIN 3.3* 3.0* 2.3*   No results for input(s): LIPASE, AMYLASE in the last 168 hours. No results for input(s): AMMONIA in the last 168 hours.  CBC:  Recent Labs Lab 05/02/17 0047 05/03/17 0429 05/04/17 0429 05/04/17 1935 05/05/17 0337 05/05/17 1618 05/06/17 0338  WBC 11.9* 9.2 7.1  --  5.3  --  5.2  HGB 8.8* 8.0* 7.9* 7.1* 7.0* 9.5* 9.3*  HCT 25.1* 23.9* 23.2* 21.0* 21.4* 28.0* 28.0*  MCV 89.3 91.2 91.7  --  91.5  --  90.6  PLT 115* 153 188  --  210  --  230    Cardiac Enzymes:  Recent Labs Lab 05/01/17 0344 05/01/17 0737 05/01/17 1314 05/01/17 2005  CKTOTAL 474*  --   --   --   CKMB 16.7*  --   --   --   TROPONINI  --  0.46* 0.44* 0.43*    BNP: BNP (last 3 results) No results for input(s): BNP in the last 8760 hours.  ProBNP (last 3 results) No results for input(s): PROBNP in the last 8760 hours.    Other results:  Imaging: Dg Chest Port 1 View  Result Date: 05/06/2017 CLINICAL DATA:  Chest tube placement.  Recent extubation. EXAM: PORTABLE CHEST 1 VIEW COMPARISON:  05/05/2017; 05/04/2017; 05/03/2017 FINDINGS: Grossly unchanged  cardiac silhouette and mediastinal contours post median sternotomy and valve replacement. Interval extubation removal of enteric tube. Otherwise, stable position of support apparatus. Grossly unchanged trace right apical pneumothorax. The amount of right lateral chest wall subcutaneous emphysema is grossly unchanged. Suspected slight increase in small left-sided effusion with associated worsening left basilar opacities. Pulmonary vasculature appears slightly less distinct than present examination, more conspicuous within the left lung. Acute minimally displaced fractures involving the left second through fourth ribs. IMPRESSION: 1. Interval extubation and removal of enteric tube. Otherwise, stable positioning of remaining support apparatus with unchanged trace right apical pneumothorax and grossly unchanged amount of right chest wall subcutaneous emphysema. 2. Suspected slight worsening in asymmetric pulmonary edema with development of a left-sided effusion and worsening left basilar opacities, likely atelectasis. Electronically Signed   By: Sandi Mariscal M.D.   On: 05/06/2017 07:34   Dg Chest Port 1 View  Result Date: 05/05/2017 CLINICAL DATA:  Endotracheal tube EXAM: PORTABLE CHEST 1 VIEW COMPARISON:  05/04/2017 FINDINGS: Two LEFT chest tubes in place. Small apical pneumothorax measures 10 mm from the apical chest wall similar to 12 mm on prior. Extensive subcutaneous gas along the RIGHT chest wall unchanged. Endotracheal tube, NG tube, and PICC line unchanged. LEFT basilar atelectasis. IMPRESSION: 1. Small RIGHT apical pneumothorax with 2 chest tubes in place. 2. No significant change.  Stable support apparatus. 3. LEFT basilar atelectasis. These results will be called to the ordering clinician or representative by the Radiologist Assistant, and communication documented in the PACS or zVision Dashboard. Electronically Signed   By: Suzy Bouchard M.D.   On: 05/05/2017 08:20     Medications:      Scheduled Medications: . acetylcysteine  2 mL Nebulization Q6H  . aspirin EC  81 mg Oral Daily   Or  . aspirin  81 mg Per Tube Daily  . bisacodyl  10 mg Oral Daily   Or  . bisacodyl  10 mg Rectal Daily  . chlorhexidine gluconate (MEDLINE KIT)  15 mL Mouth Rinse BID  . Chlorhexidine Gluconate Cloth  6 each  Topical Daily  . docusate  200 mg Oral Daily  . feeding supplement (PRO-STAT SUGAR FREE 64)  30 mL Per Tube TID  . furosemide  40 mg Intravenous BID  . guaiFENesin  600 mg Oral BID  . insulin aspart  0-24 Units Subcutaneous Q4H  . levalbuterol  0.63 mg Nebulization TID  . magic mouthwash  5 mL Oral TID  . mouth rinse  15 mL Mouth Rinse 10 times per day  . metoCLOPramide (REGLAN) injection  10 mg Intravenous Q6H  . pantoprazole (PROTONIX) IV  40 mg Intravenous Q24H  . sodium chloride flush  10-40 mL Intracatheter Q12H  . sodium chloride flush  3 mL Intravenous Q12H  . sodium chloride HYPERTONIC  4 mL Nebulization BID  . warfarin  2.5 mg Oral q1800  . Warfarin - Physician Dosing Inpatient   Does not apply q1800    Infusions: . amiodarone 30 mg/hr (05/05/17 2010)  . DOPamine 3 mcg/kg/min (05/05/17 0400)  . feeding supplement (VITAL 1.5 CAL) Stopped (05/05/17 2000)  . norepinephrine (LEVOPHED) Adult infusion 2 mcg/min (05/06/17 0400)  . piperacillin-tazobactam (ZOSYN)  IV Stopped (05/06/17 0700)  . potassium chloride    . vancomycin Stopped (05/06/17 0130)    PRN Medications: levalbuterol, ondansetron (ZOFRAN) IV, sodium chloride flush, sodium chloride flush, sorbitol   Assessment:   1. VT arrest: EP folllowing. No VT overnight.  - Continue IV amiodarone. Consider changing to amio po if he passes swallow evaluation.  - Plan ICD prior to d/c 2. 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  EF now down to 10-15% from 25-30% pre-op. Suspect acute drop related to recent cardiac surgery, bypass, and arrest.  Remains on dopamine 3 mcg  + norepi 2 mcg. Wean norepi.  Todays CO-OX is 70%. CVP 8-9. Continue lasix 40 mg IV twice a day. Renal function stable.  3. 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  4. Acute hypoxic respiratory failure: Has severe COPD, also with considerable LLL volume loss/atelectasis. Extubated 6/21 but re-intubated. Yesterday he was Extubated..  Now on 12 liters oxygen. Add IS.  5. R PTX: R apical pneumo with chest tube x2 in place. TCTS managing  6. 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   7. Acute delirium  Length of Stay: [facility]  NP-C  05/06/2017, 8:35 AM  Advanced Heart Failure Team Pager [phone] (M-F; [place])  Please contact [facility] for night-coverage after hours (4p -7a ) and weekends on [email or web address]  Patient seen and examined with Darrick Grinder, NP. We discussed all aspects of the encounter. I agree with the assessment and plan as stated above.   He remains extremely tenuous. CVP and co-ox ok currently. But BP remains soft. Will wean pressors slowly as tolerated.  Will try to keep I/Os even.   Main issue now is his respiratory status in setting of severe COPD and right PTX. He is at high-risk for re-intubation. Continue NT suction. CCM following closely.   VT currently quiescent. Continue IV amio,.   Mental status waxing and waning.   Glori Bickers, MD  5:30 PM

## 2017-05-06 NOTE — Progress Notes (Signed)
Progress Note  Patient Name: Jack Joseph Date of Encounter: 05/06/2017  Primary Cardiologist: [place]  Subjective   Extubated, c/o sore throat. No chest pain or sob.  Inpatient Medications    Scheduled Meds: . acetylcysteine  2 mL Nebulization Q6H  . aspirin EC  81 mg Oral Daily   Or  . aspirin  81 mg Per Tube Daily  . bisacodyl  10 mg Oral Daily   Or  . bisacodyl  10 mg Rectal Daily  . chlorhexidine gluconate (MEDLINE KIT)  15 mL Mouth Rinse BID  . Chlorhexidine Gluconate Cloth  6 each Topical Daily  . docusate  200 mg Oral Daily  . feeding supplement (PRO-STAT SUGAR FREE 64)  30 mL Per Tube TID  . furosemide  40 mg Intravenous BID  . guaiFENesin  600 mg Oral BID  . insulin aspart  0-24 Units Subcutaneous Q4H  . levalbuterol  0.63 mg Nebulization TID  . magic mouthwash  5 mL Oral TID  . mouth rinse  15 mL Mouth Rinse 10 times per day  . metoCLOPramide (REGLAN) injection  10 mg Intravenous Q6H  . pantoprazole (PROTONIX) IV  40 mg Intravenous Q24H  . sodium chloride flush  10-40 mL Intracatheter Q12H  . sodium chloride flush  3 mL Intravenous Q12H  . sodium chloride HYPERTONIC  4 mL Nebulization BID  . warfarin  2.5 mg Oral q1800  . Warfarin - Physician Dosing Inpatient   Does not apply q1800   Continuous Infusions: . potassium chloride    . amiodarone 30 mg/hr (05/06/17 0841)  . DOPamine 3 mcg/kg/min (05/05/17 0400)  . feeding supplement (VITAL 1.5 CAL) Stopped (05/05/17 2000)  . magnesium sulfate 1 - 4 g bolus IVPB    . norepinephrine (LEVOPHED) Adult infusion 2 mcg/min (05/06/17 0400)  . piperacillin-tazobactam (ZOSYN)  IV Stopped (05/06/17 0700)  . potassium chloride 10 mEq (05/06/17 0842)  . vancomycin Stopped (05/06/17 0130)   PRN Meds: levalbuterol, ondansetron (ZOFRAN) IV, sodium chloride flush, sodium chloride flush, sorbitol   Vital Signs    Vitals:   05/06/17 0700 05/06/17 0715 05/06/17 0745 05/06/17 0753  BP: 124/68 (!) 96/56 (!) 106/49     Pulse: 73 85 86   Resp: 17 (!) 26 (!) 23   Temp:  98.2 F (36.8 C)    TempSrc:  Oral    SpO2: 93% 96% 99% 91%  Weight:      Height:        Intake/Output Summary (Last 24 hours) at 05/06/17 0845 Last data filed at 05/06/17 0714  Gross per 24 hour  Intake          2170.63 ml  Output             4677 ml  Net         -2506.37 ml   Filed Weights   05/04/17 0500 05/05/17 0508 05/06/17 0345  Weight: 152 lb 12.5 oz (69.3 kg) 157 lb 13.6 oz (71.6 kg) 149 lb 14.6 oz (68 kg)    Telemetry    nsr with PVC's and NSVT - Personally Reviewed  ECG    none - Personally Reviewed  Physical Exam   GEN: No acute distress.   Neck: 6 cm JVD Cardiac: RRR, no murmurs, rubs, or gallops. Midline sternotomy Respiratory: Clear to auscultation bilaterally. GI: Soft, nontender, non-distended  MS: No edema; No deformity. Neuro:  Nonfocal  Psych: Normal affect   Labs    Chemistry Recent Labs Lab 05/01/17 0115  05/02/17 0047  05/04/17 0429  05/05/17 0337 05/05/17 1618 05/06/17 0338  NA 134* 131*  < > 128*  < > 129* 133* 135  K 3.7 3.8  < > 3.4*  < > 3.7 4.3 3.6  CL 101 97*  < > 95*  < > 96* 92* 93*  CO2 22 25  < > 27  --  27  --  29  GLUCOSE 179* 148*  < > 145*  < > 122* 125* 108*  BUN 12 11  < > 14  < > 22* 28* 24*  CREATININE 1.31* 0.94  < > 0.77  < > 0.77 0.90 0.95  CALCIUM 8.0* 7.7*  < > 7.5*  --  7.7*  --  7.8*  PROT 5.1* 5.0*  --  4.8*  --   --   --   --   ALBUMIN 3.3* 3.0*  --  2.3*  --   --   --   --   AST 42* 36  --  22  --   --   --   --   ALT 18 20  --  18  --   --   --   --   ALKPHOS 30* 37*  --  49  --   --   --   --   BILITOT 0.9 0.7  --  0.8  --   --   --   --   GFRNONAA 53* >60  < > >60  --  >60  --  >60  GFRAA >60 >60  < > >60  --  >60  --  >60  ANIONGAP 11 9  < > 6  --  6  --  13  < > = values in this interval not displayed.   Hematology Recent Labs Lab 05/04/17 0429  05/05/17 0337 05/05/17 1618 05/06/17 0338  WBC 7.1  --  5.3  --  5.2  RBC 2.53*  --   2.34*  --  3.09*  HGB 7.9*  < > 7.0* 9.5* 9.3*  HCT 23.2*  < > 21.4* 28.0* 28.0*  MCV 91.7  --  91.5  --  90.6  MCH 31.2  --  29.9  --  30.1  MCHC 34.1  --  32.7  --  33.2  RDW 14.9  --  14.8  --  15.8*  PLT 188  --  210  --  230  < > = values in this interval not displayed.  Cardiac Enzymes Recent Labs Lab 05/01/17 0737 05/01/17 1314 05/01/17 2005  TROPONINI 0.46* 0.44* 0.43*   No results for input(s): TROPIPOC in the last 168 hours.   BNPNo results for input(s): BNP, PROBNP in the last 168 hours.   DDimer No results for input(s): DDIMER in the last 168 hours.   Radiology    Dg Chest Port 1 View  Result Date: 05/06/2017 CLINICAL DATA:  Chest tube placement.  Recent extubation. EXAM: PORTABLE CHEST 1 VIEW COMPARISON:  05/05/2017; 05/04/2017; 05/03/2017 FINDINGS: Grossly unchanged cardiac silhouette and mediastinal contours post median sternotomy and valve replacement. Interval extubation removal of enteric tube. Otherwise, stable position of support apparatus. Grossly unchanged trace right apical pneumothorax. The amount of right lateral chest wall subcutaneous emphysema is grossly unchanged. Suspected slight increase in small left-sided effusion with associated worsening left basilar opacities. Pulmonary vasculature appears slightly less distinct than present examination, more conspicuous within the left lung. Acute minimally displaced fractures involving the left second through fourth ribs. IMPRESSION:  1. Interval extubation and removal of enteric tube. Otherwise, stable positioning of remaining support apparatus with unchanged trace right apical pneumothorax and grossly unchanged amount of right chest wall subcutaneous emphysema. 2. Suspected slight worsening in asymmetric pulmonary edema with development of a left-sided effusion and worsening left basilar opacities, likely atelectasis. Electronically Signed   By: Sandi Mariscal M.D.   On: 05/06/2017 07:34   Dg Chest Port 1  View  Result Date: 05/05/2017 CLINICAL DATA:  Endotracheal tube EXAM: PORTABLE CHEST 1 VIEW COMPARISON:  05/04/2017 FINDINGS: Two LEFT chest tubes in place. Small apical pneumothorax measures 10 mm from the apical chest wall similar to 12 mm on prior. Extensive subcutaneous gas along the RIGHT chest wall unchanged. Endotracheal tube, NG tube, and PICC line unchanged. LEFT basilar atelectasis. IMPRESSION: 1. Small RIGHT apical pneumothorax with 2 chest tubes in place. 2. No significant change.  Stable support apparatus. 3. LEFT basilar atelectasis. These results will be called to the ordering clinician or representative by the Radiologist Assistant, and communication documented in the PACS or zVision Dashboard. Electronically Signed   By: Suzy Bouchard M.D.   On: 05/05/2017 08:20    Cardiac Studies   none  Patient Profile     71 y.o. male admitted and underwent ascending aorta replacement who developed VT on POD#2.    Assessment & Plan    1. Post op VT - he has had no sustained VT on amiodarone. He still cannot swallow so we will continue his IV amiodarone. Transition to po once able to swallow. 2. Chronic systolic heart failure - he is improving. However, still on pressors. Hopefully he will continue to wean.  3. VDRF - he is extubated but remains weak. I have encouraged him to use his incentive spirometry.  4. coags - his inr is subtherapeutic today. I will defer decision on IV heparin to Dr. Gerilyn Pilgrim and the pharmacy staff.  Signed, Cristopher Peru, MD  05/06/2017, 8:45 AM  Patient ID: Jack Joseph, male   DOB: [birth year], 71 y.o.   MRN: [id number]

## 2017-05-06 NOTE — Progress Notes (Signed)
Patient ID: Jack Joseph, male   DOB: 11-Sep-1946, 71 y.o.   MRN: [id number] EVENING ROUNDS NOTE :     [address]       [place] [address]             [phone]                 7 Days Post-Op Procedure(s) (LRB): REDO STERNOTOMY (N/A) REPLACEMENT ASCENDING AORTA/ WHEAT PROCEDURE, HYPOTHERMIC CIRCULATORY ARREST AND CARDIOPULMONARY BYPASS, AND USE OF HEMASHIELD PLATINUM WOVEN DOUBLE VELOUR VASCULAR GRAFT 32 MM X 50 CM (N/A) TRANSESOPHAGEAL ECHOCARDIOGRAM (TEE) (N/A)  Total Length of Stay:  LOS: 7 days  BP 118/86   Pulse (!) 51   Temp 97.7 F (36.5 C) (Oral)   Resp (!) 27   Ht 5\' 10"  (1.778 m)   Wt 149 lb 14.6 oz (68 kg)   SpO2 95%   BMI 21.51 kg/m   .Intake/Output      06/25 0701 - 06/26 0700   I.V. (mL/kg) 244.4 (3.6)   Blood    NG/GT 263.2   IV Piggyback 300   Total Intake(mL/kg) 807.6 (11.9)   Urine (mL/kg/hr) 1500 (1.7)   Stool 0 (0)   Chest Tube 90 (0.1)   Total Output 1590   Net -782.4       Stool Occurrence 1 x     . amiodarone 30 mg/hr (05/06/17 1800)  . DOPamine 2.989 mcg/kg/min (05/06/17 1800)  . feeding supplement (VITAL 1.5 CAL) 1,000 mL (05/06/17 1800)  . norepinephrine (LEVOPHED) Adult infusion Stopped (05/06/17 1800)  . piperacillin-tazobactam (ZOSYN)  IV Stopped (05/06/17 1610)  . potassium chloride 10 mEq (05/06/17 1915)  . vancomycin Stopped (05/06/17 1345)     Lab Results  Component Value Date   WBC 5.2 05/06/2017   HGB 9.2 (L) 05/06/2017   HCT 27.0 (L) 05/06/2017   PLT 230 05/06/2017   GLUCOSE 238 (H) 05/06/2017   CHOL 199 01/22/2017   TRIG 71 01/22/2017   HDL 64 01/22/2017   LDLCALC 121 (H) 01/22/2017   ALT 18 05/04/2017   AST 22 05/04/2017   NA 132 (L) 05/06/2017   K 3.5 05/06/2017   CL 92 (L) 05/06/2017   CREATININE 0.90 05/06/2017   BUN 25 (H) 05/06/2017   CO2 29 05/06/2017   TSH 5.390 (H) 01/22/2017   INR 1.51 05/06/2017   HGBA1C 5.7 (H) 04/25/2017   mildly confused  Increased suq air today,  still with apical/anterior pig tail with air leak Coumadin given today  reanl function good  [name] MD  Beeper [phone] Office [phone] 05/06/2017 7:47 PM

## 2017-05-06 NOTE — Evaluation (Signed)
Clinical/Bedside Swallow Evaluation Patient Details  Name: Jack Joseph MRN: [id number] Date of Birth: 06/13/1946  Today's Date: 05/06/2017 Time: SLP Start Time (ACUTE ONLY): 0850 SLP Stop Time (ACUTE ONLY): 0905 SLP Time Calculation (min) (ACUTE ONLY): 15 min  Past Medical History:  Past Medical History:  Diagnosis Date  . AVD (aortic valve disease)   . Chronic anticoagulation   . Dilated cardiomyopathy (HCC)    EF 30-35%  . Dysrhythmia   . Hyperlipidemia   . LV dysfunction    Past Surgical History:  Past Surgical History:  Procedure Laterality Date  . AORTIC VALVE REPLACEMENT    . BENTALL PROCEDURE N/A 04/29/2017   Procedure: REPLACEMENT ASCENDING AORTA/ WHEAT PROCEDURE, HYPOTHERMIC CIRCULATORY ARREST AND CARDIOPULMONARY BYPASS, AND USE OF HEMASHIELD PLATINUM WOVEN DOUBLE VELOUR VASCULAR GRAFT 32 MM X 50 CM;  Surgeon: [name], MD;  Location: [facility] OR;  Service: Open Heart Surgery;  Laterality: N/A;  . DOPPLER ECHOCARDIOGRAPHY  04/07/2002   EF 30-35%  . RIGHT/LEFT HEART CATH AND CORONARY ANGIOGRAPHY N/A 03/05/2017   Procedure: Right/Left Heart Cath and Coronary Angiography;  Surgeon: Peter M Swaziland, MD;  Location: [facility] INVASIVE CV LAB;  Service: Cardiovascular;  Laterality: N/A;  . TEE WITHOUT CARDIOVERSION N/A 04/29/2017   Procedure: TRANSESOPHAGEAL ECHOCARDIOGRAM (TEE);  Surgeon: [name], MD;  Location: [facility] OR;  Service: Open Heart Surgery;  Laterality: N/A;   HPI:  71 yo male former smoker with hx remote AVR 1983 on chronic coumadin, COPD, LV dysfunction with EF 30-35%, dilated ascending aorta who initially presented 6/18 for elective surgical repair. Underwent ascending aortic replacement/wheat procedure with CABG. Was progressing well post op and ambulating in hall. However just after midnight 6/20 had VT arrest with CPR and shock x 3. Intubated during ACLS.  Right pneumothorax with chest tube x2. ETT 6/20-6/21; reintubated 6/21-6/24.    Assessment /  Plan / Recommendation Clinical Impression  Pt presents with a post-extubation dysphagia (ETT x3 over seven days), marked by total aphonia, poor secretion management, decreased respiratory strength.  Pt is a high aspiration risk.  For today, recommend continued NPO; allow only occasional ice chips after oral care; consider replacing NG.  SLP will follow for readiness for instrumental swallow study - will need FEES prior to initiating PO diet.  SLP Visit Diagnosis: Dysphagia, unspecified (R13.10)    Aspiration Risk  Severe aspiration risk    Diet Recommendation   NPO - only occasional ice chips, after oral care.    Medication Administration: Via alternative means    Other  Recommendations Oral Care Recommendations: Oral care QID   Follow up Recommendations  (tba)      Frequency and Duration min 3x week  2 weeks       Prognosis Prognosis for Safe Diet Advancement: Good      Swallow Study   General Date of Onset: 04/29/17 HPI: 71 yo male former smoker with hx remote AVR 1983 on chronic coumadin, COPD, LV dysfunction with EF 30-35%, dilated ascending aorta who initially presented 6/18 for elective surgical repair. Underwent ascending aortic replacement/wheat procedure with CABG. Was progressing well post op and ambulating in hall. However just after midnight 6/20 had VT arrest with CPR and shock x 3. Intubated during ACLS.  Right pneumothorax with chest tube x2. ETT 6/20-6/21; reintubated 6/21-6/24.  Type of Study: Bedside Swallow Evaluation Previous Swallow Assessment: no Diet Prior to this Study: NPO Temperature Spikes Noted: No Respiratory Status: Nasal cannula History of Recent Intubation: Yes Length of Intubations (  days):  (see HPI - intubated x3) Date extubated: 05/05/17 Behavior/Cognition: Alert;Cooperative Oral Cavity Assessment: Within Functional Limits Oral Care Completed by SLP: Recent completion by staff Oral Cavity - Dentition: Edentulous Vision: Functional for  self-feeding Self-Feeding Abilities: Able to feed self Patient Positioning: Upright in chair Baseline Vocal Quality: Aphonic Volitional Cough: Weak;Congested Volitional Swallow: Able to elicit    Oral/Motor/Sensory Function Overall Oral Motor/Sensory Function: Within functional limits   Ice Chips Ice chips: Impaired Presentation: Self Fed;Spoon Pharyngeal Phase Impairments: Multiple swallows;Change in Vital Signs   Thin Liquid Thin Liquid: Not tested    Nectar Thick Nectar Thick Liquid: Not tested   Honey Thick Honey Thick Liquid: Not tested   Puree Puree: Not tested   Solid   GO   Solid: Not tested        Jack Joseph Jack Joseph 05/06/2017,9:10 AM  Jack Joseph L. Jack Joseph, [place] CCC/SLP Pager [phone]

## 2017-05-07 ENCOUNTER — Inpatient Hospital Stay (HOSPITAL_COMMUNITY): Payer: Medicare Other

## 2017-05-07 DIAGNOSIS — J95811 Postprocedural pneumothorax: Secondary | ICD-10-CM

## 2017-05-07 DIAGNOSIS — I5021 Acute systolic (congestive) heart failure: Secondary | ICD-10-CM

## 2017-05-07 DIAGNOSIS — T859XXA Unspecified complication of internal prosthetic device, implant and graft, initial encounter: Secondary | ICD-10-CM

## 2017-05-07 DIAGNOSIS — Z9689 Presence of other specified functional implants: Secondary | ICD-10-CM

## 2017-05-07 LAB — BLOOD GAS, ARTERIAL
Acid-Base Excess: 10.1 mmol/L — ABNORMAL HIGH (ref 0.0–2.0)
Bicarbonate: 34 mmol/L — ABNORMAL HIGH (ref 20.0–28.0)
Drawn by: 418751
FIO2: 100
O2 Saturation: 91.7 %
Patient temperature: 98.6
pCO2 arterial: 44.6 mmHg (ref 32.0–48.0)
pH, Arterial: 7.495 — ABNORMAL HIGH (ref 7.350–7.450)
pO2, Arterial: 60.2 mmHg — ABNORMAL LOW (ref 83.0–108.0)

## 2017-05-07 LAB — POCT I-STAT 3, ART BLOOD GAS (G3+)
Acid-Base Excess: 12 mmol/L — ABNORMAL HIGH (ref 0.0–2.0)
Bicarbonate: 36.7 mmol/L — ABNORMAL HIGH (ref 20.0–28.0)
O2 Saturation: 100 %
Patient temperature: 98.6
TCO2: 38 mmol/L (ref 0–100)
pCO2 arterial: 45.8 mmHg (ref 32.0–48.0)
pH, Arterial: 7.511 — ABNORMAL HIGH (ref 7.350–7.450)
pO2, Arterial: 337 mmHg — ABNORMAL HIGH (ref 83.0–108.0)

## 2017-05-07 LAB — BASIC METABOLIC PANEL
ANION GAP: 8 (ref 5–15)
BUN: 27 mg/dL — ABNORMAL HIGH (ref 6–20)
CALCIUM: 8.1 mg/dL — AB (ref 8.9–10.3)
CO2: 33 mmol/L — ABNORMAL HIGH (ref 22–32)
Chloride: 93 mmol/L — ABNORMAL LOW (ref 101–111)
Creatinine, Ser: 0.98 mg/dL (ref 0.61–1.24)
Glucose, Bld: 156 mg/dL — ABNORMAL HIGH (ref 65–99)
Potassium: 3.6 mmol/L (ref 3.5–5.1)
Sodium: 134 mmol/L — ABNORMAL LOW (ref 135–145)

## 2017-05-07 LAB — POCT I-STAT, CHEM 8
BUN: 28 mg/dL — ABNORMAL HIGH (ref 6–20)
Calcium, Ion: 1.05 mmol/L — ABNORMAL LOW (ref 1.15–1.40)
Chloride: 92 mmol/L — ABNORMAL LOW (ref 101–111)
Creatinine, Ser: 0.8 mg/dL (ref 0.61–1.24)
Glucose, Bld: 212 mg/dL — ABNORMAL HIGH (ref 65–99)
HCT: 24 % — ABNORMAL LOW (ref 39.0–52.0)
Hemoglobin: 8.2 g/dL — ABNORMAL LOW (ref 13.0–17.0)
Potassium: 3.8 mmol/L (ref 3.5–5.1)
Sodium: 136 mmol/L (ref 135–145)
TCO2: 32 mmol/L (ref 0–100)

## 2017-05-07 LAB — CBC
HCT: 29.3 % — ABNORMAL LOW (ref 39.0–52.0)
HEMOGLOBIN: 9.6 g/dL — AB (ref 13.0–17.0)
MCH: 30.1 pg (ref 26.0–34.0)
MCHC: 32.8 g/dL (ref 30.0–36.0)
MCV: 91.8 fL (ref 78.0–100.0)
Platelets: 333 10*3/uL (ref 150–400)
RBC: 3.19 MIL/uL — AB (ref 4.22–5.81)
RDW: 15.3 % (ref 11.5–15.5)
WBC: 9.2 10*3/uL (ref 4.0–10.5)

## 2017-05-07 LAB — COOXEMETRY PANEL
Carboxyhemoglobin: 1.3 % (ref 0.5–1.5)
Methemoglobin: 1 % (ref 0.0–1.5)
O2 SAT: 60.9 %
Total hemoglobin: 8.6 g/dL — ABNORMAL LOW (ref 12.0–16.0)

## 2017-05-07 LAB — PROCALCITONIN: Procalcitonin: 0.8 ng/mL

## 2017-05-07 LAB — GLUCOSE, CAPILLARY
Glucose-Capillary: 134 mg/dL — ABNORMAL HIGH (ref 65–99)
Glucose-Capillary: 118 mg/dL — ABNORMAL HIGH (ref 65–99)
Glucose-Capillary: 188 mg/dL — ABNORMAL HIGH (ref 65–99)
Glucose-Capillary: 123 mg/dL — ABNORMAL HIGH (ref 65–99)

## 2017-05-07 LAB — PROTIME-INR
INR: 1.55
Prothrombin Time: 18.7 seconds — ABNORMAL HIGH (ref 11.4–15.2)

## 2017-05-07 MED ORDER — FUROSEMIDE 10 MG/ML IJ SOLN: 40.0000 mg | Freq: Once | INTRAMUSCULAR | Status: DC

## 2017-05-07 MED ORDER — SODIUM CHLORIDE 0.9 % IV SOLN
0.4000 ug/kg/h | INTRAVENOUS | Status: DC
  Administered 2017-05-07: 0.4 ug/kg/h via INTRAVENOUS
  Filled 2017-05-07 (×2): qty 2

## 2017-05-07 MED ORDER — FENTANYL CITRATE (PF) 100 MCG/2ML IJ SOLN
INTRAMUSCULAR | Status: AC
  Administered 2017-05-07: 100 ug via INTRAVENOUS
  Filled 2017-05-07: qty 2

## 2017-05-07 MED ORDER — FUROSEMIDE 40 MG PO TABS: 40.0000 mg | ORAL_TABLET | Freq: Two times a day (BID) | ORAL | Status: DC

## 2017-05-07 MED ORDER — FENTANYL CITRATE (PF) 100 MCG/2ML IJ SOLN
100.0000 ug | INTRAMUSCULAR | Status: DC | PRN
  Administered 2017-05-07 – 2017-05-12 (×3): 100 ug via INTRAVENOUS
  Filled 2017-05-07 (×3): qty 2

## 2017-05-07 MED ORDER — DEXMEDETOMIDINE HCL IN NACL 400 MCG/100ML IV SOLN
0.0000 ug/kg/h | INTRAVENOUS | Status: AC
  Administered 2017-05-08: 1 ug/kg/h via INTRAVENOUS
  Administered 2017-05-08: 0.9 ug/kg/h via INTRAVENOUS
  Administered 2017-05-08 (×2): 1 ug/kg/h via INTRAVENOUS
  Administered 2017-05-09: 0.5 ug/kg/h via INTRAVENOUS
  Administered 2017-05-09: 1 ug/kg/h via INTRAVENOUS
  Administered 2017-05-10: 0.5 ug/kg/h via INTRAVENOUS
  Administered 2017-05-10: 0.4 ug/kg/h via INTRAVENOUS
  Filled 2017-05-07 (×8): qty 100

## 2017-05-07 MED ORDER — FENTANYL CITRATE (PF) 100 MCG/2ML IJ SOLN
100.0000 ug | INTRAMUSCULAR | Status: DC | PRN
  Administered 2017-05-12: 100 ug via INTRAVENOUS
  Filled 2017-05-07: qty 2

## 2017-05-07 MED ORDER — POTASSIUM CHLORIDE 10 MEQ/50ML IV SOLN
INTRAVENOUS | Status: AC
  Administered 2017-05-07: 10 meq
  Filled 2017-05-07: qty 150

## 2017-05-07 MED ORDER — SODIUM CHLORIDE 0.9 % IV SOLN
0.0000 ug/kg/h | INTRAVENOUS | Status: DC
  Administered 2017-05-07 (×3): 1 ug/kg/h via INTRAVENOUS
  Administered 2017-05-07: 0.9 ug/kg/h via INTRAVENOUS
  Filled 2017-05-07 (×3): qty 2

## 2017-05-07 MED ORDER — MIDAZOLAM HCL 2 MG/2ML IJ SOLN
3.0000 mg | Freq: Once | INTRAMUSCULAR | Status: AC
Start: 2017-05-07 — End: 2017-05-07
  Administered 2017-05-07: 3 mg via INTRAVENOUS

## 2017-05-07 MED ORDER — POTASSIUM CHLORIDE 10 MEQ/50ML IV SOLN: 10.0000 meq | INTRAVENOUS | Status: DC | PRN

## 2017-05-07 MED ORDER — HEPARIN (PORCINE) IN NACL 100-0.45 UNIT/ML-% IJ SOLN
1550.0000 [IU]/h | INTRAMUSCULAR | Status: DC
  Administered 2017-05-07: 900 [IU]/h via INTRAVENOUS
  Administered 2017-05-08: 1300 [IU]/h via INTRAVENOUS
  Administered 2017-05-09: 1550 [IU]/h via INTRAVENOUS
  Filled 2017-05-07 (×3): qty 250

## 2017-05-07 MED ORDER — POTASSIUM CHLORIDE 10 MEQ/50ML IV SOLN
10.0000 meq | INTRAVENOUS | Status: AC
  Administered 2017-05-07 (×3): 10 meq via INTRAVENOUS
  Filled 2017-05-07 (×3): qty 50

## 2017-05-07 MED ORDER — LIDOCAINE HCL (PF) 1 % IJ SOLN
INTRAMUSCULAR | Status: AC
  Administered 2017-05-07: 07:00:00
  Filled 2017-05-07: qty 30

## 2017-05-07 MED ORDER — MIDAZOLAM HCL 2 MG/2ML IJ SOLN
INTRAMUSCULAR | Status: AC
  Filled 2017-05-07: qty 4

## 2017-05-07 MED ORDER — MIDAZOLAM HCL 2 MG/2ML IJ SOLN
2.0000 mg | Freq: Once | INTRAMUSCULAR | Status: AC
  Administered 2017-05-07: 2 mg via INTRAVENOUS

## 2017-05-07 MED ORDER — POTASSIUM CHLORIDE 10 MEQ/50ML IV SOLN
10.0000 meq | INTRAVENOUS | Status: AC
  Administered 2017-05-07 (×2): 10 meq via INTRAVENOUS

## 2017-05-07 MED ORDER — AMIODARONE HCL 200 MG PO TABS
200.0000 mg | ORAL_TABLET | Freq: Two times a day (BID) | ORAL | Status: DC
  Administered 2017-05-07 – 2017-05-08 (×4): 200 mg via ORAL
  Filled 2017-05-07 (×4): qty 1

## 2017-05-07 MED ORDER — MIDAZOLAM HCL 2 MG/2ML IJ SOLN
INTRAMUSCULAR | Status: AC
  Administered 2017-05-07: 2 mg via INTRAVENOUS
  Filled 2017-05-07: qty 2

## 2017-05-07 MED ORDER — FENTANYL CITRATE (PF) 100 MCG/2ML IJ SOLN
50.0000 ug | Freq: Once | INTRAMUSCULAR | Status: AC
  Administered 2017-05-07: 50 ug via INTRAVENOUS

## 2017-05-07 MED ORDER — FENTANYL CITRATE (PF) 100 MCG/2ML IJ SOLN
100.0000 ug | Freq: Once | INTRAMUSCULAR | Status: AC
  Administered 2017-05-07: 100 ug via INTRAVENOUS

## 2017-05-07 MED ORDER — SODIUM CHLORIDE 0.9 % IV SOLN
INTRAVENOUS | Status: DC
  Administered 2017-05-09: 10 mL/h via INTRAVENOUS
  Administered 2017-05-10: 14:00:00 via INTRAVENOUS
  Administered 2017-05-11: 10 mL/h via INTRAVENOUS
  Administered 2017-05-16: 03:00:00 via INTRAVENOUS

## 2017-05-07 MED ORDER — FENTANYL CITRATE (PF) 100 MCG/2ML IJ SOLN
INTRAMUSCULAR | Status: AC
  Filled 2017-05-07: qty 2

## 2017-05-07 MED ORDER — ETOMIDATE 2 MG/ML IV SOLN
20.0000 mg | Freq: Once | INTRAVENOUS | Status: AC
  Administered 2017-05-07: 20 mg via INTRAVENOUS

## 2017-05-07 MED ORDER — MIDAZOLAM HCL 2 MG/2ML IJ SOLN
INTRAMUSCULAR | Status: AC
  Filled 2017-05-07: qty 2

## 2017-05-07 NOTE — Progress Notes (Addendum)
PULMONARY / CRITICAL CARE MEDICINE   Name: Jack Joseph MRN: [id number] DOB: [birth year]    ADMISSION DATE:  04/29/2017  CONSULTATION DATE:  6/20  REFERRING MD:  Tyrone Sage (CVTS)  CHIEF COMPLAINT:  Post arrest/ vent management   HISTORY OF PRESENT ILLNESS:   71yo male former smoker with hx remote AVR 1983 on chronic coumadin, COPD (FEV1 1.14 (35%), FEV1/FVC 47), LV dysfunction with EF 30-35%, dilated ascending aorta who initially presented 6/18 for elective surgical repair.  Underwent ascending aortic replacement/wheat procedure with CABG.  Was progressing well post op and ambulating in hall.  However just after midnight 6/20 had VT arrest with CPR and shock x 3.  Intubated during ACLS, small R ptx noted post CPR and CT placed by CVTS.  PCCM consulted for vent management.   SUBJECTIVE:  Right chest tube replaced due to progressive SQ emphysema Still confused with ICU delirium  PAST MEDICAL HISTORY :  He  has a past medical history of AVD (aortic valve disease); Chronic anticoagulation; Dilated cardiomyopathy (HCC); Dysrhythmia; Hyperlipidemia; and LV dysfunction.  PAST SURGICAL HISTORY: He  has a past surgical history that includes Aortic valve replacement; doppler echocardiography (04/07/2002); Right/Left Heart Cath and Coronary Angiography (N/A, 03/05/2017); Bentall procedure (N/A, 04/29/2017); and TEE without cardioversion (N/A, 04/29/2017).  Allergies  Allergen Reactions  . No Known Allergies     No current facility-administered medications on file prior to encounter.    Current Outpatient Prescriptions on File Prior to Encounter  Medication Sig  . carvedilol (COREG) 3.125 MG tablet Take 2 tablets (6.25 mg total) by mouth 2 (two) times daily.  . colchicine 0.6 MG tablet Take 0.6 mg by mouth daily as needed.  Marland Kitchen losartan (COZAAR) 25 MG tablet Take 1 tablet (25 mg total) by mouth daily.  . methylcellulose (CITRUCEL) oral powder Take 1 packet by mouth 2 (two) times daily.    . sildenafil (REVATIO) 20 MG tablet TAKE 1-5 TABLETS BY MOUTH AS NEEDED FOR SEXUAL ACTIVITY (Patient not taking: Reported on 04/25/2017)  . warfarin (COUMADIN) 5 MG tablet Take 1 tablet by mouth daily or as directed by [facility] (Patient not taking: Reported on 04/25/2017)    FAMILY HISTORY:  His indicated that his mother is alive. He indicated that his father is deceased. He indicated that his brother is deceased. He indicated that his maternal grandmother is deceased. He indicated that his maternal grandfather is deceased. He indicated that his paternal grandmother is deceased. He indicated that his paternal grandfather is deceased.    SOCIAL HISTORY: He  reports that he quit smoking about 16 years ago. He has never used smokeless tobacco. He reports that he does not drink alcohol or use drugs.  REVIEW OF SYSTEMS:   Unable to obtain as pt is confused.    VITAL SIGNS: BP (!) 85/59   Pulse (!) 47   Temp 99.2 F (37.3 C) (Oral)   Resp (!) 24   Ht 5\' 10"  (1.778 m)   Wt 141 lb 8.6 oz (64.2 kg)   SpO2 90%   BMI 20.31 kg/m   HEMODYNAMICS: CVP:  [2 mmHg-8 mmHg] 5 mmHg  VENTILATOR SETTINGS: FiO2 (%):  [40 %-100 %] 80 %  INTAKE / OUTPUT: I/O last 3 completed shifts: In: 2774.3 [I.V.:721.1; NG/GT:853.2; IV Piggyback:1200] Out: 4245 [Urine:4000; Chest Tube:245]  PHYSICAL EXAMINATION: Gen:      Mild distress HEENT:  EOMI, sclera anicteric. Face, neck and right arm swelling with crepitus.  Neck:     No masses;  no thyromegaly Lungs:    Coarse rhonchi. Greater on left; Increased respiratory effort CV:         Tachycardia.irregular; no murmurs Abd:      + bowel sounds; soft, non-tender; no palpable masses, no distension Ext:    No edema; adequate peripheral perfusion Skin:      Warm and dry; no rash Neuro: Moves all extremities. No focal deficits. Confused.   LABS:  BMET  Recent Labs Lab 05/05/17 0337  05/06/17 0338 05/06/17 1629 05/06/17 1639 05/07/17 0401  NA 129*   < > 135 114* 132* 134*  K 3.7  < > 3.6 2.8* 3.5 3.6  CL 96*  < > 93* 77* 92* 93*  CO2 27  --  29  --   --  33*  BUN 22*  < > 24* 20 25* 27*  CREATININE 0.77  < > 0.95 0.60* 0.90 0.98  GLUCOSE 122*  < > 108* >700* 238* 156*  < > = values in this interval not displayed.  Electrolytes  Recent Labs Lab 05/03/17 1845 05/04/17 0429 05/05/17 0337 05/06/17 0338 05/07/17 0401  CALCIUM  --  7.5* 7.7* 7.8* 8.1*  MG 2.3 2.0 1.9 1.7  --   PHOS 2.9 2.7  --   --   --     CBC  Recent Labs Lab 05/05/17 0337  05/06/17 0338 05/06/17 1629 05/06/17 1639 05/07/17 0401  WBC 5.3  --  5.2  --   --  9.2  HGB 7.0*  < > 9.3* 7.5* 9.2* 9.6*  HCT 21.4*  < > 28.0* 22.0* 27.0* 29.3*  PLT 210  --  230  --   --  333  < > = values in this interval not displayed.  Coag's  Recent Labs Lab 05/05/17 0337 05/06/17 0338 05/07/17 0401  INR 2.17 1.51 1.55    Sepsis Markers  Recent Labs Lab 05/01/17 0755 05/06/17 0958 05/07/17 0401  LATICACIDVEN 1.2  --   --   PROCALCITON  --  1.04 0.80    ABG  Recent Labs Lab 05/05/17 0422 05/06/17 0507 05/07/17 0424  PHART 7.444 7.412 7.495*  PCO2ART 42.2 51.2* 44.6  PO2ART 80.0* 57.0* 60.2*    Liver Enzymes  Recent Labs Lab 05/01/17 0115 05/02/17 0047 05/04/17 0429  AST 42* 36 22  ALT 18 20 18   ALKPHOS 30* 37* 49  BILITOT 0.9 0.7 0.8  ALBUMIN 3.3* 3.0* 2.3*    Cardiac Enzymes  Recent Labs Lab 05/01/17 0737 05/01/17 1314 05/01/17 2005  TROPONINI 0.46* 0.44* 0.43*    Glucose  Recent Labs Lab 05/06/17 1119 05/06/17 1531 05/06/17 1955 05/06/17 2333 05/07/17 0331 05/07/17 0841  GLUCAP 129* 160* 117* 141* 134* 118*    Imaging CXR 6/26> Left lung opacification, extensive SQ emphysema. Small right pneumothorax I have reviewed the images personally  STUDIES:  2D echo 6/20>>> - Left ventricle: The cavity size was normal. Wall thickness was normal. Systolic function was severely reduced. The estimated ejection  fraction was in the range of 10% to 15%. Doppler parameters are consistent with abnormal left ventricular relaxation (grade 1 diastolic dysfunction). - Aortic valve: A mechanical prosthesis was present. - Mitral valve: There was moderate regurgitation. - Left atrium: The atrium was mildly dilated. - Right ventricle: Systolic function was severely reduced. - Right atrium: The atrium was mildly dilated. - Tricuspid valve: There was mild-moderate regurgitation. - Pericardium, extracardiac: There was a left pleural effusion.  CULTURES: Sputum 6/24 gpc>>>  ANTIBIOTICS: vanc 6/24>>> Zosyn 6/24>>>  SIGNIFICANT EVENTS: 6/20  LINES/TUBES: ETT 6/20>>> 6/21; 6/21 >> 6/24 R chest tube #1 6/20>>> R chest tube #2 6/21 >>   ASSESSMENT / PLAN: 71yo male with VT arrest POD#8 thoracic aortic aneurysm repair.  Suffered VT arrest Extubated but still has tenuous resp status  Acute respiratory failure - post VT arrest  Hx COPD  Right pneumothorax (after CPR) status post chest tube 2 Severe left-sided atelectasis suspect that this was d/t poor cough mechanics. Possible Flail chest  Has difficulty clearing secretions with left lung mucus plugs. At risk for needing reintubation.  Plan: Plan for bronch today to clear secretions Low threshold to intubate Continue NT suctioning, IS,flutter valve, pulm hygiene CT management per CVTS  Possible HCAP Prelim cultures w/ gpc clusters Plan Continue vanco, zosyn day 3.  Follow bronchial wash cultures.   VT arrest 6/20 - unclear etiology.  Acute on chronic CHF - EF 10-15%; Shock - cardiogenic.   Repair thoracic aortic aneurysm 6/18 Hx AVR   Plan Stoping amio today Off norepi and dopamine  Fluid and electrolyte d/o -persistent hyponatremia  Plan Continue lasix  Protein calorie malnutrition  Plan Failed swallow eval Holding tube feeds due to concern for aspiration  Chronic anticoagulation, mechanical valve Anemia of  critical illness.  Plan: Will start heparin gtt today  Hyperglycemia   Plan:   SSI  ICU delirium Plan:   Limit sedating meds Avoid haldol due to VT arrest, prolonged qTC Consider precedex  FAMILY  - Updates:  Wife updated.   DVT prophylaxis: coumadin. Transitioning to heparin SUP: ppi  Diet: npo Activity: bedrest  Disposition : icu  The patient is critically ill with multiple organ system failure and requires high complexity decision making for assessment and support, frequent evaluation and titration of therapies, advanced monitoring, review of radiographic studies and interpretation of complex data.   Critical Care Time devoted to patient care services, exclusive of separately billable procedures, described in this note is 35 minutes.   Chilton Greathouse MD [place] Pager [phone] If no answer or after 3pm call: [phone] 05/07/2017, 11:03 AM

## 2017-05-07 NOTE — Progress Notes (Signed)
SLP Cancellation Note  Patient Details Name: Jack Joseph MRN: [id number] DOB: 04/19/1946   Cancelled treatment:       Reason Eval/Treat Not Completed: Medical issues which prohibited therapy   [name] [name] 05/07/2017, 10:55 AM

## 2017-05-07 NOTE — Progress Notes (Addendum)
Interval Bronch earlier w/ improved recruitment Now on precedex but minimally responsive (titrating this down rapidly) BP 99/76   Pulse 96   Temp 97.6 F (36.4 C) (Oral)   Resp (!) 29   Ht 5\' 10"  (1.778 m)   Wt 141 lb 8.6 oz (64.2 kg)   SpO2 96%   BMI 20.31 kg/m   Intake/Output Summary (Last 24 hours) at 05/07/17 1325 Last data filed at 05/07/17 1100  Gross per 24 hour  Intake          1745.99 ml  Output             1740 ml  Net             5.99 ml  Physical Exam  Constitutional: He appears listless. He has a sickly appearance. He appears distressed. He is sedated and restrained. Nasal cannula in place.  HENT:  Head: Normocephalic and atraumatic.  Mouth/Throat: Mucous membranes are dry.  Eyes: Conjunctivae and lids are normal.  Neck: Trachea normal. No JVD present.  Cardiovascular: Regular rhythm and normal heart sounds.   Pulmonary/Chest: Accessory muscle usage present. He is in respiratory distress. He has decreased breath sounds.  He has diffuse Hiram edema this has extended over the entire chest and extends up to the neck The anterior small bore chest tube has continuous airleak  The newly placed right lateral chest tube: no airleak    Abdominal: Soft. Normal appearance and bowel sounds are normal. He exhibits no mass. There is no tenderness.  Neurological: He has normal strength. He appears listless. No cranial nerve deficit or sensory deficit. GCS eye subscore is 1. GCS verbal subscore is 2. GCS motor subscore is 1.  Skin: Skin is warm and dry.  Psychiatric: He has a normal mood and affect. He is attentive.   Impression/plan  Acute hypoxic respiratory failure in setting of recurrent atelectasis.  I favor flail chest and deconditioning as largest factors here; likely now exacerbated by progressive delirium Plan -now reintubated. Resume mechanical ventilation -Non-contrast CT chest-->evaluate rib fractures and placement of anterior chest tube (see below) PAD protocol  He  may need trach  Iatrogenic right pneumothorax s/p CPR -I think he likely has several rib fractures. Also the right anterior chest tube has constant leak. Plan Non-contrast CT chest  Cont CT rx per thoracic surgery   My cct 49 min Simonne Martinet ACNP-BC [facility] Pulmonary/Critical Care Pager # [phone] OR # [phone] if no answer

## 2017-05-07 NOTE — Progress Notes (Signed)
Pt transported to and from CT without incident ?

## 2017-05-07 NOTE — Progress Notes (Addendum)
Dr. Marchelle Gearing asked and came to bedside to evaluate patient

## 2017-05-07 NOTE — Progress Notes (Signed)
CXR report called to Dr. Tyrone Sage

## 2017-05-07 NOTE — Progress Notes (Signed)
Placed on heated high flow nasal cannula at this time, pt tolerating fairly well at this time

## 2017-05-07 NOTE — Procedures (Signed)
Intubation Procedure Note Tejay Hubert [id number] 07-18-1946  Procedure: Intubation Indications: Respiratory insufficiency  Procedure Details Consent: Risks of procedure as well as the alternatives and risks of each were explained to the (patient/caregiver).  Consent for procedure obtained. Time Out: Verified patient identification, verified procedure, site/side was marked, verified correct patient position, special equipment/implants available, medications/allergies/relevent history reviewed, required imaging and test results available.  Performed  Maximum sterile technique was used including antiseptics, cap, gloves, gown and hand hygiene.  MAC and 4  # 4 glide scope  # 8 ett   Evaluation Hemodynamic Status: BP stable throughout; O2 sats: stable throughout Patient's Current Condition: stable Complications: No apparent complications Patient did tolerate procedure well. Chest X-ray ordered to verify placement.  CXR: pending.  Erick Colace ACNP-BC [facility] Pager # [phone] OR # [phone] if no answer  Clementeen Graham 05/07/2017

## 2017-05-07 NOTE — Progress Notes (Signed)
Bedside bronchoscopy performed per Dr. Isaiah Serge, using disposable scope.  1% lidocaine neb given prior to procedure.  Bronchial washing sent to the lab.  No complications noted.

## 2017-05-07 NOTE — Progress Notes (Signed)
Patient ID: Jack Joseph, male   DOB: June 25, 1946, 71 y.o.   MRN: [id number]  SICU Evening Rounds:  BP ok on 13 mcg of levophed. CVP on low side 6.  Intubated today for respiratory failure and had bronchoscopy  Heparin drip started for mechanical aortic valve.  Urine output ok  BMET    Component Value Date/Time   NA 136 05/07/2017 1614   NA 135 02/27/2017 1637   K 3.8 05/07/2017 1614   CL 92 (L) 05/07/2017 1614   CO2 33 (H) 05/07/2017 0401   GLUCOSE 212 (H) 05/07/2017 1614   BUN 28 (H) 05/07/2017 1614   BUN 15 02/27/2017 1637   CREATININE 0.80 05/07/2017 1614   CALCIUM 8.1 (L) 05/07/2017 0401   GFRNONAA >60 05/07/2017 0401   GFRAA >60 05/07/2017 0401    CBC    Component Value Date/Time   WBC 9.2 05/07/2017 0401   RBC 3.19 (L) 05/07/2017 0401   HGB 8.2 (L) 05/07/2017 1614   HGB 13.5 02/27/2017 1636   HCT 24.0 (L) 05/07/2017 1614   HCT 38.7 02/27/2017 1636   PLT 333 05/07/2017 0401   PLT 350 02/27/2017 1636   MCV 91.8 05/07/2017 0401   MCV 87 02/27/2017 1636   MCH 30.1 05/07/2017 0401   MCHC 32.8 05/07/2017 0401   RDW 15.3 05/07/2017 0401   RDW 14.3 02/27/2017 1636   LYMPHSABS 1.5 02/27/2017 1636   EOSABS 0.1 02/27/2017 1636   BASOSABS 0.1 02/27/2017 1636    He may benefit from transfusion with low Hgb but will leave that decision up to Dr. Tyrone Sage.

## 2017-05-07 NOTE — Progress Notes (Signed)
Patient ID: Jack Joseph, male   DOB: Apr 16, 1946, 71 y.o.   MRN: [id number]    Advanced Heart Failure Rounding Note   Subjective:    6/21 extubated but re-intubated that night. Extubated again 6/24 to 12 liters oxygen.   Noepi and dopamine off. Todays CO-OX is 61%.failed swallow evaluation.   Earlier this morning one CT removed with another CT placed. Plan for possible bronch today. CVP 4-5  Confused. Did not sleep last night.   Objective:   Weight Range:  Vital Signs:   Temp:  [97.7 F (36.5 C)-98.4 F (36.9 C)] 98.4 F (36.9 C) (06/26 0000) Pulse Rate:  [49-105] 88 (06/26 0700) Resp:  [18-34] 25 (06/26 0800) BP: (62-136)/(43-86) 102/49 (06/26 0800) SpO2:  [86 %-100 %] 97 % (06/26 0800) FiO2 (%):  [80 %-100 %] 80 % (06/26 0548) Weight:  [141 lb 8.6 oz (64.2 kg)] 141 lb 8.6 oz (64.2 kg) (06/26 0440) Last BM Date: 05/06/17  Weight change: Filed Weights   05/05/17 0508 05/06/17 0345 05/07/17 0440  Weight: 157 lb 13.6 oz (71.6 kg) 149 lb 14.6 oz (68 kg) 141 lb 8.6 oz (64.2 kg)    Intake/Output:   Intake/Output Summary (Last 24 hours) at 05/07/17 0810 Last data filed at 05/07/17 0700  Gross per 24 hour  Intake          2087.36 ml  Output             2490 ml  Net          -402.64 ml     Physical Exam: CVP 5.  General:  Agitated but cooperative No resp difficulty. On 15 liters oxygen. In bed HEENT: Facial Edema. L eye swollen shut Neck: supple. + SQ crepitus  no JVD. Carotids 2+ bilat; no bruits. No lymphadenopathy or thryomegaly appreciated. Cor: PMI nondisplaced. Regular rate & rhythm. No rubs, gallops or murmurs. Lungs: Diffuse rhonchi . decreased in the bases on 15 liters oxygen.  Abdomen: soft, nontender, nondistended. No hepatosplenomegaly. No bruits or masses. Good bowel sounds. Extremities: no cyanosis, clubbing, rash, edema. Crepitus RUE/ chest Neuro: Oriented to self. moves all 4 extremities w/o difficulty.    Telemetry: Personlly reviewed. NSR 90s.     Labs: Basic Metabolic Panel:  Recent Labs Lab 05/03/17 0429 05/03/17 1845 05/04/17 0429  05/05/17 4132 05/05/17 1618 05/06/17 0338 05/06/17 1629 05/06/17 1639 05/07/17 0401  NA 131*  --  128*  < > 129* 133* 135 114* 132* 134*  K 3.9  --  3.4*  < > 3.7 4.3 3.6 2.8* 3.5 3.6  CL 97*  --  95*  < > 96* 92* 93* 77* 92* 93*  CO2 27  --  27  --  27  --  29  --   --  33*  GLUCOSE 111*  --  145*  < > 122* 125* 108* >700* 238* 156*  BUN 10  --  14  < > 22* 28* 24* 20 25* 27*  CREATININE 0.80  --  0.77  < > 0.77 0.90 0.95 0.60* 0.90 0.98  CALCIUM 7.8*  --  7.5*  --  7.7*  --  7.8*  --   --  8.1*  MG 1.7 2.3 2.0  --  1.9  --  1.7  --   --   --   PHOS  --  2.9 2.7  --   --   --   --   --   --   --   < > =  values in this interval not displayed.  Liver Function Tests:  Recent Labs Lab 05/01/17 0115 05/02/17 0047 05/04/17 0429  AST 42* 36 22  ALT _0 ALKPHOS 30* 37* 49  BILITOT 0.9 0.7 0.8  PROT 5.1* 5.0* 4.8*  ALBUMIN 3.3* 3.0* 2.3*   No results for input(s): LIPASE, AMYLASE in the last 168 hours. No results for input(s): AMMONIA in the last 168 hours.  CBC:  Recent Labs Lab 05/03/17 0429 05/04/17 0429  05/05/17 0337 05/05/17 1618 05/06/17 0338 05/06/17 1629 05/06/17 1639 05/07/17 0401  WBC 9.2 7.1  --  5.3  --  5.2  --   --  9.2  HGB 8.0* 7.9*  < > 7.0* 9.5* 9.3* 7.5* 9.2* 9.6*  HCT 23.9* 23.2*  < > 21.4* 28.0* 28.0* 22.0* 27.0* 29.3*  MCV 91.2 91.7  --  91.5  --  90.6  --   --  91.8  PLT 153 188  --  210  --  230  --   --  333  < > = values in this interval not displayed.  Cardiac Enzymes:  Recent Labs Lab 05/01/17 0344 05/01/17 0737 05/01/17 1314 05/01/17 2005  CKTOTAL 474*  --   --   --   CKMB 16.7*  --   --   --   TROPONINI  --  0.46* 0.44* 0.43*    BNP: BNP (last 3 results) No results for input(s): BNP in the last 8760 hours.  ProBNP (last 3 results) No results for input(s): PROBNP in the last 8760 hours.    Other  results:  Imaging: Dg Chest Port 1 View  Result Date: 05/07/2017 CLINICAL DATA:  Bilateral pneumothoraces, chest tube treatment. EXAM: PORTABLE CHEST 1 VIEW COMPARISON:  Portable chest x-ray of May 07, 2017 at 2:20 4 a.m. FINDINGS: There remains extensive subcutaneous emphysema over the thorax. A large caliber chest tube on the right just lateral to the small caliber chest tube. The tip of the large caliber tube overlies the posterior right fifth and sixth rib interspace. Fractures of the lateral ribs superiorly are demonstrated. There is a tiny apical pneumothorax on the right. On the left there is air adjacent to the aortic arch which likely reflects pneumomediastinum or small very medial pleural effusion. Opacity the opacification of much of the left lung persists but has not significantly worsened since yesterday's study. Shift of the mediastinum toward the left has occurred but is accentuated by patient rotation. The cardiac silhouette is obscured. The pulmonary vascularity visible on the right does not appear engorged. The sternal wires are intact. The feeding tube tip projects below the inferior margin of the image. IMPRESSION: Stable appearance of the chest since the earlier study. Persistent small right pneumothorax. Small amount of pneumomediastinum versus small medial pneumothorax on the left. There is mediastinal shift toward the left which is stable. Stable opacification of much of the mid and lower left lung. Electronically Signed   By: David  Martinique M.D.   On: 05/07/2017 07:42   Dg Chest Port 1 View  Result Date: 05/07/2017 CLINICAL DATA:  71 y/o  M; chest tube in situ. EXAM: PORTABLE CHEST 1 VIEW COMPARISON:  05/06/2017 chest radiograph FINDINGS: Worsening aeration of the left lung with near complete collapse and moderate left-sided pleural effusion. Lucency surrounding the aortic knob suggests trace left-sided pneumothorax. Extensive subcutaneous emphysema over the right chest wall  obscures the lung. Trace right apical pneumothorax. Two chest tubes on the right in situ. Enteric tube  tip extends below the field of view into the abdomen. Displaced right second through fourth rib fractures. IMPRESSION: 1. Worsening aeration of left lung with near complete collapse and moderate left effusion. 2. Lucency around aortic knob suggest trace left pneumothorax. 3. Stable small right pneumothorax. 4. Stable lines and tubes. Electronically Signed   By: Kristine Garbe M.D.   On: 05/07/2017 02:59   Dg Chest Port 1 View  Result Date: 05/06/2017 CLINICAL DATA:  Chest tube placement.  Recent extubation. EXAM: PORTABLE CHEST 1 VIEW COMPARISON:  05/05/2017; 05/04/2017; 05/03/2017 FINDINGS: Grossly unchanged cardiac silhouette and mediastinal contours post median sternotomy and valve replacement. Interval extubation removal of enteric tube. Otherwise, stable position of support apparatus. Grossly unchanged trace right apical pneumothorax. The amount of right lateral chest wall subcutaneous emphysema is grossly unchanged. Suspected slight increase in small left-sided effusion with associated worsening left basilar opacities. Pulmonary vasculature appears slightly less distinct than present examination, more conspicuous within the left lung. Acute minimally displaced fractures involving the left second through fourth ribs. IMPRESSION: 1. Interval extubation and removal of enteric tube. Otherwise, stable positioning of remaining support apparatus with unchanged trace right apical pneumothorax and grossly unchanged amount of right chest wall subcutaneous emphysema. 2. Suspected slight worsening in asymmetric pulmonary edema with development of a left-sided effusion and worsening left basilar opacities, likely atelectasis. Electronically Signed   By: Sandi Mariscal M.D.   On: 05/06/2017 07:34   Dg Abd Portable 1v  Result Date: 05/07/2017 CLINICAL DATA:  Feeding tube placement EXAM: PORTABLE ABDOMEN - 1  VIEW COMPARISON:  05/03/2017 FINDINGS: Consolidation in the left lung base. Feeding tube tip overlies the second portion of the duodenum. Gas-filled bowel loops but without obstructive pattern. IMPRESSION: 1. Esophageal tube tip overlies the second portion of the duodenum 2. Consolidation at the left lung base Electronically Signed   By: Donavan Foil M.D.   On: 05/07/2017 02:55     Medications:     Scheduled Medications: . acetylcysteine  4 mL Nebulization Q6H  . aspirin EC  81 mg Oral Daily   Or  . aspirin  81 mg Per Tube Daily  . bisacodyl  10 mg Oral Daily   Or  . bisacodyl  10 mg Rectal Daily  . chlorhexidine gluconate (MEDLINE KIT)  15 mL Mouth Rinse BID  . Chlorhexidine Gluconate Cloth  6 each Topical Daily  . docusate  200 mg Oral Daily  . feeding supplement (PRO-STAT SUGAR FREE 64)  30 mL Per Tube BID  . furosemide  40 mg Intravenous BID  . insulin aspart  0-24 Units Subcutaneous Q4H  . levalbuterol  0.63 mg Nebulization TID  . lidocaine (PF)      . magic mouthwash  5 mL Oral TID  . mouth rinse  15 mL Mouth Rinse 10 times per day  . metoCLOPramide (REGLAN) injection  10 mg Intravenous Q6H  . pantoprazole sodium  40 mg Per Tube Daily  . sodium chloride flush  10-40 mL Intracatheter Q12H  . sodium chloride flush  3 mL Intravenous Q12H  . sodium chloride HYPERTONIC  4 mL Nebulization BID  . warfarin  2.5 mg Oral q1800  . Warfarin - Physician Dosing Inpatient   Does not apply q1800    Infusions: . amiodarone 30 mg/hr (05/07/17 0200)  . DOPamine Stopped (05/07/17 0100)  . feeding supplement (VITAL 1.5 CAL) Stopped (05/07/17 0500)  . norepinephrine (LEVOPHED) Adult infusion Stopped (05/06/17 1800)  . piperacillin-tazobactam (ZOSYN)  IV Stopped (05/07/17 0730)  .  vancomycin Stopped (05/07/17 0053)    PRN Medications: acetaminophen (TYLENOL) oral liquid 160 mg/5 mL, guaiFENesin, levalbuterol, ondansetron (ZOFRAN) IV, sodium chloride flush, sodium chloride flush,  sorbitol   Assessment:   1. VT arrest: No VT overnight.  - Continue IV amiodarone. Now has cortrac. Would switch amio 200 mg twice a day. .  - Plan ICD prior to d/c 2. 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  EF now down to 10-15% from 25-30% pre-op. Suspect acute drop related to recent cardiac surgery, bypass, and arrest.  Off dopamine and norepi. Todays CO-OX is 61%.  CVP 5. Give one more dose IV lasix then stop and switch lasix 40 mg po twice a day.  Renal function stable.  3. 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  4. Acute hypoxic respiratory failure: Has severe COPD, also with considerable LLL volume loss/atelectasis. Extubated 6/21 but re-intubated. Extubated 6/24//18.  Now on 12 liters oxygen. Continue  IS.  5. R PTX: R apical pneumo with chest tube x2 in place. Planning bronch later today. TCTS managing  6. 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   7. Acute delirium 8. Dysphagia- ST following.   Length of Stay: [place]  NP-C  05/07/2017, 8:10 AM  Advanced Heart Failure Team Pager [phone] (M-F; 7a - 4p)  Please contact [facility] for night-coverage after hours (4p -7a ) and weekends on [email or web address]  Patient seen and examined with Darrick Grinder, NP. We discussed all aspects of the encounter. I agree with the assessment and plan as stated above.   He remains stable from cardiac standpoint. CVP and co-ox ok. No further VT. Main issues are pulmonary and delirium. Repeat CT placed today. Plan for possible bronch per Dr. Vaughan Browner today to assess LLL volume loss.   Glori Bickers, MD  10:27 AM

## 2017-05-07 NOTE — Progress Notes (Signed)
Dr. Tyrone Sage made aware of worsening sub Q air and continued confusion, no orders received at this time

## 2017-05-07 NOTE — Progress Notes (Signed)
Dr. Gerhardt at bedside 

## 2017-05-07 NOTE — Progress Notes (Signed)
AM CXR obtained along with abdominal xray due to patient getting caught on feeding tube while manipulating oxygen mask during respiratory treatment

## 2017-05-07 NOTE — Progress Notes (Signed)
Patient ID: Jack Joseph, male   DOB: Apr 26, 1946, 71 y.o.   MRN: [id number] TCTS DAILY ICU PROGRESS NOTE                   [address]            [place] [id number]          [phone]   8 Days Post-Op Procedure(s) (LRB): REDO STERNOTOMY (N/A) REPLACEMENT ASCENDING AORTA/ WHEAT PROCEDURE, HYPOTHERMIC CIRCULATORY ARREST AND CARDIOPULMONARY BYPASS, AND USE OF HEMASHIELD PLATINUM WOVEN DOUBLE VELOUR VASCULAR GRAFT 32 MM X 50 CM (N/A) TRANSESOPHAGEAL ECHOCARDIOGRAM (TEE) (N/A)  Total Length of Stay:  LOS: 8 days   Subjective: Awake and alert but confused at times during the night  Objective: Vital signs in last 24 hours: Temp:  [97.7 F (36.5 C)-98.4 F (36.9 C)] 98.4 F (36.9 C) (06/26 0000) Pulse Rate:  [49-105] 96 (06/26 0600) Cardiac Rhythm: Sinus tachycardia (06/25 2000) Resp:  [18-34] 33 (06/26 0600) BP: (62-136)/(43-86) 101/62 (06/26 0600) SpO2:  [85 %-100 %] 95 % (06/26 0600) FiO2 (%):  [80 %-100 %] 80 % (06/26 0548) Weight:  [141 lb 8.6 oz (64.2 kg)] 141 lb 8.6 oz (64.2 kg) (06/26 0440)  Filed Weights   05/05/17 0508 05/06/17 0345 05/07/17 0440  Weight: 157 lb 13.6 oz (71.6 kg) 149 lb 14.6 oz (68 kg) 141 lb 8.6 oz (64.2 kg)    Weight change: -8 lb 6 oz (-3.8 kg)   Hemodynamic parameters for last 24 hours: CVP:  [2 mmHg-8 mmHg] 2 mmHg  Intake/Output from previous day: 06/25 0701 - 06/26 0700 In: 2070.7 [I.V.:457.5; NG/GT:813.2; IV Piggyback:800] Out: 6333 [Urine:2400; Chest Tube:90]  Intake/Output this shift: No intake/output data recorded.  Current Meds: Scheduled Meds: . acetylcysteine  4 mL Nebulization Q6H  . aspirin EC  81 mg Oral Daily   Or  . aspirin  81 mg Per Tube Daily  . bisacodyl  10 mg Oral Daily   Or  . bisacodyl  10 mg Rectal Daily  . chlorhexidine gluconate (MEDLINE KIT)  15 mL Mouth Rinse BID  . Chlorhexidine Gluconate Cloth  6 each Topical Daily  . docusate  200 mg Oral Daily  . feeding supplement (PRO-STAT  SUGAR FREE 64)  30 mL Per Tube BID  . furosemide  40 mg Intravenous BID  . insulin aspart  0-24 Units Subcutaneous Q4H  . levalbuterol  0.63 mg Nebulization TID  . magic mouthwash  5 mL Oral TID  . mouth rinse  15 mL Mouth Rinse 10 times per day  . metoCLOPramide (REGLAN) injection  10 mg Intravenous Q6H  . pantoprazole sodium  40 mg Per Tube Daily  . sodium chloride flush  10-40 mL Intracatheter Q12H  . sodium chloride flush  3 mL Intravenous Q12H  . sodium chloride HYPERTONIC  4 mL Nebulization BID  . warfarin  2.5 mg Oral q1800  . Warfarin - Physician Dosing Inpatient   Does not apply q1800   Continuous Infusions: . amiodarone 30 mg/hr (05/07/17 0200)  . DOPamine Stopped (05/07/17 0100)  . feeding supplement (VITAL 1.5 CAL) 1,000 mL (05/06/17 1800)  . norepinephrine (LEVOPHED) Adult infusion Stopped (05/06/17 1800)  . piperacillin-tazobactam (ZOSYN)  IV 3.375 g (05/07/17 0330)  . potassium chloride 10 mEq (05/07/17 0615)  . vancomycin Stopped (05/07/17 0053)   PRN Meds:.acetaminophen (TYLENOL) oral liquid 160 mg/5 mL, guaiFENesin, levalbuterol, ondansetron (ZOFRAN) IV, sodium chloride flush, sodium chloride flush, sorbitol  General appearance: cooperative and  slowed mentation Neurologic: intact Heart: regular rate and rhythm, S1, S2 normal, no murmur, click, rub or gallop Lungs: rhonchi bilaterally Abdomen: soft, non-tender; bowel sounds normal; no masses,  no organomegaly Extremities: extremities normal, atraumatic, no cyanosis or edema and Homans sign is negative, no sign of DVT Wound: sternum intact, ribs not palpable broken  Lab Results: CBC: Recent Labs  05/06/17 0338  05/06/17 1639 05/07/17 0401  WBC 5.2  --   --  9.2  HGB 9.3*  < > 9.2* 9.6*  HCT 28.0*  < > 27.0* 29.3*  PLT 230  --   --  333  < > = values in this interval not displayed. BMET:  Recent Labs  05/06/17 0338  05/06/17 1639 05/07/17 0401  NA 135  < > 132* 134*  K 3.6  < > 3.5 3.6  CL 93*  < >  92* 93*  CO2 29  --   --  33*  GLUCOSE 108*  < > 238* 156*  BUN 24*  < > 25* 27*  CREATININE 0.95  < > 0.90 0.98  CALCIUM 7.8*  --   --  8.1*  < > = values in this interval not displayed.  CMET: Lab Results  Component Value Date   WBC 9.2 05/07/2017   HGB 9.6 (L) 05/07/2017   HCT 29.3 (L) 05/07/2017   PLT 333 05/07/2017   GLUCOSE 156 (H) 05/07/2017   CHOL 199 01/22/2017   TRIG 71 01/22/2017   HDL 64 01/22/2017   LDLCALC 121 (H) 01/22/2017   ALT 18 05/04/2017   AST 22 05/04/2017   NA 134 (L) 05/07/2017   K 3.6 05/07/2017   CL 93 (L) 05/07/2017   CREATININE 0.98 05/07/2017   BUN 27 (H) 05/07/2017   CO2 33 (H) 05/07/2017   TSH 5.390 (H) 01/22/2017   INR 1.55 05/07/2017   HGBA1C 5.7 (H) 04/25/2017      PT/INR:  Recent Labs  05/07/17 0401  LABPROT 18.7*  INR 1.55   Radiology: Dg Chest Port 1 View  Result Date: 05/07/2017 CLINICAL DATA:  71 y/o  M; chest tube in situ. EXAM: PORTABLE CHEST 1 VIEW COMPARISON:  05/06/2017 chest radiograph FINDINGS: Worsening aeration of the left lung with near complete collapse and moderate left-sided pleural effusion. Lucency surrounding the aortic knob suggests trace left-sided pneumothorax. Extensive subcutaneous emphysema over the right chest wall obscures the lung. Trace right apical pneumothorax. Two chest tubes on the right in situ. Enteric tube tip extends below the field of view into the abdomen. Displaced right second through fourth rib fractures. IMPRESSION: 1. Worsening aeration of left lung with near complete collapse and moderate left effusion. 2. Lucency around aortic knob suggest trace left pneumothorax. 3. Stable small right pneumothorax. 4. Stable lines and tubes. Electronically Signed   By: Kristine Garbe M.D.   On: 05/07/2017 02:59   Dg Abd Portable 1v  Result Date: 05/07/2017 CLINICAL DATA:  Feeding tube placement EXAM: PORTABLE ABDOMEN - 1 VIEW COMPARISON:  05/03/2017 FINDINGS: Consolidation in the left lung base.  Feeding tube tip overlies the second portion of the duodenum. Gas-filled bowel loops but without obstructive pattern. IMPRESSION: 1. Esophageal tube tip overlies the second portion of the duodenum 2. Consolidation at the left lung base Electronically Signed   By: Donavan Foil M.D.   On: 05/07/2017 02:55     Assessment/Plan: S/P Procedure(s) (LRB): REDO STERNOTOMY (N/A) REPLACEMENT ASCENDING AORTA/ WHEAT PROCEDURE, HYPOTHERMIC CIRCULATORY ARREST AND CARDIOPULMONARY BYPASS, AND USE OF HEMASHIELD  PLATINUM WOVEN DOUBLE VELOUR VASCULAR GRAFT 32 MM X 50 CM (N/A) TRANSESOPHAGEAL ECHOCARDIOGRAM (TEE) (N/A) Increasing sub q air noted. First tube not functioning , new standard chest tube placed on the right , first tube removed  Increasing atelectasis left lung need nt suctioning , holding tube feeding since 6 am for bedside bronch today by pulmonary / ccm inr 1.55 , likely will start heparin after Driscilla Grammes 05/07/2017 7:01 AM

## 2017-05-07 NOTE — Procedures (Signed)
Chest Tube Insertion Procedure Note  Indications:  Clinically significant Pneumothorax and sub q air   Pre-operative Diagnosis: Pneumothorax  Post-operative Diagnosis: Pneumothorax  Procedure Details  Informed consent was obtained for the procedure, including sedation.  Risks of lung perforation, hemorrhage, arrhythmia, and adverse drug reaction were discussed.   After sterile skin prep, using standard technique, a 28 French tube was placed in the right lateral 5 rib space.  Findings: Return of air   Estimated Blood Loss:  Minimal         Specimens:  None              Complications:  None; patient tolerated the procedure well.         Disposition: ICU - extubated and stable.         Condition: stable  Attending Attestation: I performed the procedure.

## 2017-05-07 NOTE — Progress Notes (Signed)
ANTICOAGULATION CONSULT NOTE - Initial Consult  Pharmacy Consult for Heparin Indication: Bridge while warfarin on hold  Allergies  Allergen Reactions  . No Known Allergies     Patient Measurements: Height: 5\' 10"  (177.8 cm) Weight: 141 lb 8.6 oz (64.2 kg) IBW/kg (Calculated) : 73   Vital Signs: Temp: 97.6 F (36.4 C) (06/26 1115) Temp Source: Oral (06/26 1115) BP: 148/63 (06/26 1445) Pulse Rate: 43 (06/26 1445)  Labs:  Recent Labs  05/05/17 0337  05/06/17 0338 05/06/17 1629 05/06/17 1639 05/07/17 0401  HGB 7.0*  < > 9.3* 7.5* 9.2* 9.6*  HCT 21.4*  < > 28.0* 22.0* 27.0* 29.3*  PLT 210  --  230  --   --  333  LABPROT 24.6*  --  18.3*  --   --  18.7*  INR 2.17  --  1.51  --   --  1.55  CREATININE 0.77  < > 0.95 0.60* 0.90 0.98  < > = values in this interval not displayed.  Estimated Creatinine Clearance: 61.9 mL/min (by C-G formula based on SCr of 0.98 mg/dL).   Medical History: Past Medical History:  Diagnosis Date  . AVD (aortic valve disease)   . Chronic anticoagulation   . Dilated cardiomyopathy (HCC)    EF 30-35%  . Dysrhythmia   . Hyperlipidemia   . LV dysfunction     Assessment: 71 year old male s/p ascending aorta replacement 04/29/17 and s/p bronch today to begin heparin until warfarin can be started  Goal of Therapy:  Heparin level = 0.3 - 0.5 units / ml Monitor platelets by anticoagulation protocol: Yes   Plan:  Heparin drip at 900 units / hr Heparin level 8 hours after heparin starts Daily heparin level, CBC  Thank you Okey Regal, PharmD [phone]  05/07/2017,3:03 PM

## 2017-05-07 NOTE — Progress Notes (Signed)
Progress Note  Patient Name: Jack Joseph Date of Encounter: 05/07/2017  Primary Cardiologist: [place]  Subjective   Respiratory status guarded. No heart racing  Inpatient Medications    Scheduled Meds: . amiodarone  200 mg Oral BID  . aspirin EC  81 mg Oral Daily   Or  . aspirin  81 mg Per Tube Daily  . bisacodyl  10 mg Oral Daily   Or  . bisacodyl  10 mg Rectal Daily  . chlorhexidine gluconate (MEDLINE KIT)  15 mL Mouth Rinse BID  . Chlorhexidine Gluconate Cloth  6 each Topical Daily  . docusate  200 mg Oral Daily  . feeding supplement (PRO-STAT SUGAR FREE 64)  30 mL Per Tube BID  . furosemide  40 mg Intravenous Once  . insulin aspart  0-24 Units Subcutaneous Q4H  . levalbuterol  0.63 mg Nebulization TID  . magic mouthwash  5 mL Oral TID  . mouth rinse  15 mL Mouth Rinse 10 times per day  . metoCLOPramide (REGLAN) injection  10 mg Intravenous Q6H  . pantoprazole sodium  40 mg Per Tube Daily  . sodium chloride flush  10-40 mL Intracatheter Q12H  . sodium chloride flush  3 mL Intravenous Q12H  . sodium chloride HYPERTONIC  4 mL Nebulization BID  . Warfarin - Physician Dosing Inpatient   Does not apply q1800   Continuous Infusions: . sodium chloride Stopped (05/07/17 1436)  . amiodarone Stopped (05/07/17 1100)  . dexmedetomidine (PRECEDEX) IV infusion 1 mcg/kg/hr (05/07/17 2110)  . DOPamine Stopped (05/07/17 0100)  . feeding supplement (VITAL 1.5 CAL) Stopped (05/07/17 0500)  . heparin 900 Units/hr (05/07/17 1528)  . norepinephrine (LEVOPHED) Adult infusion 13 mcg/min (05/07/17 1749)  . piperacillin-tazobactam (ZOSYN)  IV 3.375 g (05/07/17 1436)  . potassium chloride    . potassium chloride 10 mEq (05/07/17 2020)  . vancomycin Stopped (05/07/17 1537)   PRN Meds: acetaminophen (TYLENOL) oral liquid 160 mg/5 mL, fentaNYL (SUBLIMAZE) injection, fentaNYL (SUBLIMAZE) injection, guaiFENesin, levalbuterol, ondansetron (ZOFRAN) IV, potassium chloride, sodium chloride  flush, sodium chloride flush, sorbitol   Vital Signs    Vitals:   05/07/17 1900 05/07/17 1915 05/07/17 1930 05/07/17 2001  BP: (!) 123/53 (!) 136/54 (!) 116/54   Pulse: (!) 46 (!) 58 (!) 59 80  Resp: '16 16 16 16  '$ Temp:    98.3 F (36.8 C)  TempSrc:    Oral  SpO2: 100% 100% 100% 100%  Weight:      Height:        Intake/Output Summary (Last 24 hours) at 05/07/17 2118 Last data filed at 05/07/17 1900  Gross per 24 hour  Intake          1637.59 ml  Output             1372 ml  Net           265.59 ml   Filed Weights   05/05/17 0508 05/06/17 0345 05/07/17 0440  Weight: 157 lb 13.6 oz (71.6 kg) 149 lb 14.6 oz (68 kg) 141 lb 8.6 oz (64.2 kg)    Telemetry    nsr with PVC's rare NSVT - Personally Reviewed  ECG    none - Personally Reviewed  Physical Exam   GEN: No acute distress.   Neck: 6 cm JVD Cardiac: RRR, no murmurs, rubs, or gallops.  Respiratory: decreased breath sounds on left  GI: Soft, nontender, non-distended  MS: No edema; No deformity. Subcutaneous emphysema is present Neuro:  Nonfocal  Psych: Normal affect   Labs    Chemistry Recent Labs Lab 05/01/17 0115 05/02/17 0047  05/04/17 0429  05/05/17 0337  05/06/17 0338  05/06/17 1639 05/07/17 0401 05/07/17 1614  NA 134* 131*  < > 128*  < > 129*  < > 135  < > 132* 134* 136  K 3.7 3.8  < > 3.4*  < > 3.7  < > 3.6  < > 3.5 3.6 3.8  CL 101 97*  < > 95*  < > 96*  < > 93*  < > 92* 93* 92*  CO2 22 25  < > 27  --  27  --  29  --   --  33*  --   GLUCOSE 179* 148*  < > 145*  < > 122*  < > 108*  < > 238* 156* 212*  BUN 12 11  < > 14  < > 22*  < > 24*  < > 25* 27* 28*  CREATININE 1.31* 0.94  < > 0.77  < > 0.77  < > 0.95  < > 0.90 0.98 0.80  CALCIUM 8.0* 7.7*  < > 7.5*  --  7.7*  --  7.8*  --   --  8.1*  --   PROT 5.1* 5.0*  --  4.8*  --   --   --   --   --   --   --   --   ALBUMIN 3.3* 3.0*  --  2.3*  --   --   --   --   --   --   --   --   AST 42* 36  --  22  --   --   --   --   --   --   --   --   ALT 18 20   --  18  --   --   --   --   --   --   --   --   ALKPHOS 30* 37*  --  49  --   --   --   --   --   --   --   --   BILITOT 0.9 0.7  --  0.8  --   --   --   --   --   --   --   --   GFRNONAA 53* >60  < > >60  --  >60  --  >60  --   --  >60  --   GFRAA >60 >60  < > >60  --  >60  --  >60  --   --  >60  --   ANIONGAP 11 9  < > 6  --  6  --  13  --   --  8  --   < > = values in this interval not displayed.   Hematology Recent Labs Lab 05/05/17 [phone]  05/06/17 0338  05/06/17 1639 05/07/17 0401 05/07/17 1614  WBC 5.3  --  5.2  --   --  9.2  --   RBC 2.34*  --  3.09*  --   --  3.19*  --   HGB 7.0*  < > 9.3*  < > 9.2* 9.6* 8.2*  HCT 21.4*  < > 28.0*  < > 27.0* 29.3* 24.0*  MCV 91.5  --  90.6  --   --  91.8  --   MCH 29.9  --  30.1  --   --  30.1  --   MCHC 32.7  --  33.2  --   --  32.8  --   RDW 14.8  --  15.8*  --   --  15.3  --   PLT 210  --  230  --   --  333  --   < > = values in this interval not displayed.  Cardiac Enzymes Recent Labs Lab 05/01/17 0737 05/01/17 1314 05/01/17 2005  TROPONINI 0.46* 0.44* 0.43*   No results for input(s): TROPIPOC in the last 168 hours.   BNPNo results for input(s): BNP, PROBNP in the last 168 hours.   DDimer No results for input(s): DDIMER in the last 168 hours.   Radiology    Ct Chest Wo Contrast  Result Date: 05/07/2017 CLINICAL DATA:  71 year old male with diffuse subcutaneous emphysema and right pneumothorax. Evaluate thoracostomy tubes. Recent ascending aortic repair/graft. EXAM: CT CHEST WITHOUT CONTRAST TECHNIQUE: Multidetector CT imaging of the chest was performed following the standard protocol without IV contrast. COMPARISON:  05/07/2017 chest radiograph and prior studies. FINDINGS: Cardiovascular: Cardiomegaly and aortic valve replacement noted. Fluid within the anterior mediastinum and adjacent to the ascending aorta is likely postoperative. Coronary artery calcifications noted. Mediastinum/Nodes: No enlarged lymph nodes. An  endotracheal tube with tip 3 cm above the carina and NG tube entering the stomach noted. Diffuse pneumomediastinum noted. Lungs/Pleura: A large bore right thoracostomy tube enters at the 6 intracostal space and extends superiorly and posteriorly within the major fissure with tip posteriorly located at the T5-T6 level. A smaller bore right thoracostomy pigtail catheter enters anteriorly at the second intercostal space and is coiled in the anterior pleural space at the T5-6 level. A 5-10% anterior right pneumothorax is noted. Severe subcutaneous emphysema bilaterally identified. A moderate to large left pleural effusion and left lower lung atelectasis noted. Scattered areas of atelectasis within the right lower lobe noted. Upper Abdomen: No acute abnormality. Musculoskeletal: There are fractures of the lateral right second through sixth ribs. Fractures of the IMPRESSION: 5-10% anterior right pneumothorax with 2 right thoracostomy tubes as described. Diffuse subcutaneous emphysema and pneumomediastinum. Fractures of the right second through sixth ribs. Moderate to large left pleural effusion and left lower lung atelectasis. Scattered right lower lobe atelectasis. Postoperative changes and other support apparatus as described. Electronically Signed   By: Margarette Canada M.D.   On: 05/07/2017 20:35   Dg Chest Port 1 View  Result Date: 05/07/2017 CLINICAL DATA:  Status posttracheal intubation. EXAM: PORTABLE CHEST 1 VIEW COMPARISON:  Portable chest x-ray of earlier this afternoon. FINDINGS: The endotracheal tube tip is approximately 5.8 cm above the carina. The lungs are adequately inflated. The tiny right-sided pneumothorax is nearly imperceptible. The 2 right-sided chest tubes are in stable position. The left retrocardiac region remains dense. Extensive subcutaneous emphysema persists. The right-sided PICC line and the feeding tube appears stable. IMPRESSION: The endotracheal tube tip lies approximately 5.8 cm above the  carina. The remainder of the study is unchanged. Electronically Signed   By: David  [place] M.D.   On: 05/07/2017 14:36   Dg Chest Port 1 View  Result Date: 05/07/2017 CLINICAL DATA:  Atelectasis ; right pneumothorax EXAM: PORTABLE CHEST 1 VIEW COMPARISON:  Portable chest x-rays of today's date FINDINGS: The lungs arm are adequately inflated. A tiny right pneumothorax is visible. The 2 right-sided chest tubes are in stable position. The retrocardiac region on the left remains dense with obscuration of the hemidiaphragm. The heart is normal in size. The pulmonary  vascularity is not engorged. A large amount of subcutaneous emphysema is again demonstrated over the chest and axillary regions. There are rib fractures on the right. A feeding tube is present whose tip projects below the inferior margin of the image. IMPRESSION: Fairly stable approximately 5% pneumothorax on the right. The 2 right-sided chest tubes are in stable position. Persistent left lower lobe atelectasis or pneumonia with small left pleural effusion. Extensive subcutaneous emphysema overlying the thorax. Electronically Signed   By: David  [place] M.D.   On: 05/07/2017 13:56   Dg Chest Port 1 View  Result Date: 05/07/2017 CLINICAL DATA:  Post bronchoscopy, chest tubes EXAM: PORTABLE CHEST 1 VIEW COMPARISON:  Portable exam 1108 hours compared to 0724 hours FINDINGS: Feeding tube traverses esophagus into abdomen. RIGHT arm PICC line tip projects over SVC. Pair of RIGHT thoracostomy tubes with small residual RIGHT apex pneumothorax. Enlargement of cardiac silhouette post AVR. Extensive consolidation LEFT lower lobe with associated LEFT pleural effusion. Atherosclerotic calcification aorta. Scattered atelectasis RIGHT lung base. IMPRESSION: Small residual RIGHT pneumothorax despite pair of RIGHT thoracostomy tubes. Persistent LEFT lower lobe consolidation with LEFT pleural effusion. Aortic Atherosclerosis (ICD10-I70.0). Electronically Signed   By:  Ulyses Southward M.D.   On: 05/07/2017 11:26   Dg Chest Port 1 View  Result Date: 05/07/2017 CLINICAL DATA:  Bilateral pneumothoraces, chest tube treatment. EXAM: PORTABLE CHEST 1 VIEW COMPARISON:  Portable chest x-ray of May 07, 2017 at 2:20 4 a.m. FINDINGS: There remains extensive subcutaneous emphysema over the thorax. A large caliber chest tube on the right just lateral to the small caliber chest tube. The tip of the large caliber tube overlies the posterior right fifth and sixth rib interspace. Fractures of the lateral ribs superiorly are demonstrated. There is a tiny apical pneumothorax on the right. On the left there is air adjacent to the aortic arch which likely reflects pneumomediastinum or small very medial pleural effusion. Opacity the opacification of much of the left lung persists but has not significantly worsened since yesterday's study. Shift of the mediastinum toward the left has occurred but is accentuated by patient rotation. The cardiac silhouette is obscured. The pulmonary vascularity visible on the right does not appear engorged. The sternal wires are intact. The feeding tube tip projects below the inferior margin of the image. IMPRESSION: Stable appearance of the chest since the earlier study. Persistent small right pneumothorax. Small amount of pneumomediastinum versus small medial pneumothorax on the left. There is mediastinal shift toward the left which is stable. Stable opacification of much of the mid and lower left lung. Electronically Signed   By: David  [place] M.D.   On: 05/07/2017 07:42   Dg Chest Port 1 View  Result Date: 05/07/2017 CLINICAL DATA:  71 y/o  M; chest tube in situ. EXAM: PORTABLE CHEST 1 VIEW COMPARISON:  05/06/2017 chest radiograph FINDINGS: Worsening aeration of the left lung with near complete collapse and moderate left-sided pleural effusion. Lucency surrounding the aortic knob suggests trace left-sided pneumothorax. Extensive subcutaneous emphysema over the  right chest wall obscures the lung. Trace right apical pneumothorax. Two chest tubes on the right in situ. Enteric tube tip extends below the field of view into the abdomen. Displaced right second through fourth rib fractures. IMPRESSION: 1. Worsening aeration of left lung with near complete collapse and moderate left effusion. 2. Lucency around aortic knob suggest trace left pneumothorax. 3. Stable small right pneumothorax. 4. Stable lines and tubes. Electronically Signed   By: Mitzi Hansen M.D.   On:  05/07/2017 02:59   Dg Chest Port 1 View  Result Date: 05/06/2017 CLINICAL DATA:  Chest tube placement.  Recent extubation. EXAM: PORTABLE CHEST 1 VIEW COMPARISON:  05/05/2017; 05/04/2017; 05/03/2017 FINDINGS: Grossly unchanged cardiac silhouette and mediastinal contours post median sternotomy and valve replacement. Interval extubation removal of enteric tube. Otherwise, stable position of support apparatus. Grossly unchanged trace right apical pneumothorax. The amount of right lateral chest wall subcutaneous emphysema is grossly unchanged. Suspected slight increase in small left-sided effusion with associated worsening left basilar opacities. Pulmonary vasculature appears slightly less distinct than present examination, more conspicuous within the left lung. Acute minimally displaced fractures involving the left second through fourth ribs. IMPRESSION: 1. Interval extubation and removal of enteric tube. Otherwise, stable positioning of remaining support apparatus with unchanged trace right apical pneumothorax and grossly unchanged amount of right chest wall subcutaneous emphysema. 2. Suspected slight worsening in asymmetric pulmonary edema with development of a left-sided effusion and worsening left basilar opacities, likely atelectasis. Electronically Signed   By: Sandi Mariscal M.D.   On: 05/06/2017 07:34   Dg Abd Portable 1v  Result Date: 05/07/2017 CLINICAL DATA:  Feeding tube placement EXAM:  PORTABLE ABDOMEN - 1 VIEW COMPARISON:  05/03/2017 FINDINGS: Consolidation in the left lung base. Feeding tube tip overlies the second portion of the duodenum. Gas-filled bowel loops but without obstructive pattern. IMPRESSION: 1. Esophageal tube tip overlies the second portion of the duodenum 2. Consolidation at the left lung base Electronically Signed   By: Donavan Foil M.D.   On: 05/07/2017 02:55    Cardiac Studies   none  Patient Profile     71 y.o. male admitted with ascending aortic aneurysm s/p resection with post op VT, VDRF, shock, severe LV dysfunction, and left lung field collapse.   Assessment & Plan    1. Respiratory failure - he is tenuous and may well require re-intubation with left lung collapse 2. VT - he is stable on amiodarone with no recurrent VT. Will continue amiodarone. 3. Acute on chronic systolic heart failure - he remains critically ill see CHF team rec's.   Gregg Taylor,M.D.  Signed, Cristopher Peru, MD  05/07/2017, 9:18 PM  Patient ID: Jack Joseph, male   DOB: 09-30-1946, 71 y.o.   MRN: [id number]

## 2017-05-07 NOTE — Procedures (Signed)
Bronchoscopy Procedure Note Ramil Foil [id number] Jul 06, 1946  Procedure: Bronchoscopy Indications: Obtain specimens for culture and/or other diagnostic studies and Remove secretions  Procedure Details Consent: Risks of procedure as well as the alternatives and risks of each were explained to the (patient/caregiver).  Consent for procedure obtained. Time Out: Verified patient identification, verified procedure, site/side was marked, verified correct patient position, special equipment/implants available, medications/allergies/relevent history reviewed, required imaging and test results available.  Performed  In preparation for procedure, patient was given 100% FiO2, bronchoscope lubricated and inserted via oral bite block. Sedation: Versed 2 mg, fentanyl 25 mcg, Lidocaine nebs  Airway entered and the following bronchi were examined: RUL, RML, RLL, LUL, LLL and Bronchi.  Thick tenacious secretions with mucus plugs in left lung cleared after lavage. Right lung appears clear Procedures performed: Bronchial washings. Patient had destats into 80s during the procedure.  Bronchoscope removed.  Patient placed back on 100% FiO2 at conclusion of procedure.    Evaluation Hemodynamic Status: BP stable throughout; O2 sats: transiently fell during during procedure Patient's Current Condition: stable Specimens:  Sent purulent fluid Complications: No apparent complications Patient did tolerate procedure well.   Jeray Shugart 05/07/2017

## 2017-05-08 ENCOUNTER — Inpatient Hospital Stay (HOSPITAL_COMMUNITY): Payer: Medicare Other

## 2017-05-08 LAB — COOXEMETRY PANEL
CARBOXYHEMOGLOBIN: 1.7 % — AB (ref 0.5–1.5)
Methemoglobin: 0.5 % (ref 0.0–1.5)
O2 SAT: 64.4 %
Total hemoglobin: 8.6 g/dL — ABNORMAL LOW (ref 12.0–16.0)

## 2017-05-08 LAB — POCT I-STAT, CHEM 8
BUN: 42 mg/dL — ABNORMAL HIGH (ref 6–20)
BUN: 29 mg/dL — ABNORMAL HIGH (ref 6–20)
Calcium, Ion: 0.93 mmol/L — ABNORMAL LOW (ref 1.15–1.40)
Calcium, Ion: 1.07 mmol/L — ABNORMAL LOW (ref 1.15–1.40)
Chloride: 91 mmol/L — ABNORMAL LOW (ref 101–111)
Chloride: 92 mmol/L — ABNORMAL LOW (ref 101–111)
Creatinine, Ser: 1 mg/dL (ref 0.61–1.24)
Creatinine, Ser: 1.1 mg/dL (ref 0.61–1.24)
Glucose, Bld: 275 mg/dL — ABNORMAL HIGH (ref 65–99)
Glucose, Bld: 177 mg/dL — ABNORMAL HIGH (ref 65–99)
HCT: 23 % — ABNORMAL LOW (ref 39.0–52.0)
HCT: 24 % — ABNORMAL LOW (ref 39.0–52.0)
Hemoglobin: 7.8 g/dL — ABNORMAL LOW (ref 13.0–17.0)
Hemoglobin: 8.2 g/dL — ABNORMAL LOW (ref 13.0–17.0)
Potassium: 7.1 mmol/L (ref 3.5–5.1)
Potassium: 3.6 mmol/L (ref 3.5–5.1)
Sodium: 131 mmol/L — ABNORMAL LOW (ref 135–145)
Sodium: 136 mmol/L (ref 135–145)
TCO2: 34 mmol/L (ref 0–100)
TCO2: 32 mmol/L (ref 0–100)

## 2017-05-08 LAB — GLUCOSE, CAPILLARY
Glucose-Capillary: 127 mg/dL — ABNORMAL HIGH (ref 65–99)
Glucose-Capillary: 128 mg/dL — ABNORMAL HIGH (ref 65–99)
Glucose-Capillary: 130 mg/dL — ABNORMAL HIGH (ref 65–99)
Glucose-Capillary: 148 mg/dL — ABNORMAL HIGH (ref 65–99)
Glucose-Capillary: 150 mg/dL — ABNORMAL HIGH (ref 65–99)
Glucose-Capillary: 151 mg/dL — ABNORMAL HIGH (ref 65–99)
Glucose-Capillary: 153 mg/dL — ABNORMAL HIGH (ref 65–99)
Glucose-Capillary: 173 mg/dL — ABNORMAL HIGH (ref 65–99)

## 2017-05-08 LAB — BASIC METABOLIC PANEL
ANION GAP: 7 (ref 5–15)
BUN: 28 mg/dL — AB (ref 6–20)
CHLORIDE: 96 mmol/L — AB (ref 101–111)
CO2: 31 mmol/L (ref 22–32)
Calcium: 7.8 mg/dL — ABNORMAL LOW (ref 8.9–10.3)
Creatinine, Ser: 1.01 mg/dL (ref 0.61–1.24)
GFR calc Af Amer: >60 mL/min (ref >60)
GLUCOSE: 158 mg/dL — AB (ref 65–99)
POTASSIUM: 4.1 mmol/L (ref 3.5–5.1)
Sodium: 134 mmol/L — ABNORMAL LOW (ref 135–145)

## 2017-05-08 LAB — VANCOMYCIN, TROUGH: Vancomycin Tr: 14 ug/mL — ABNORMAL LOW (ref 15–20)

## 2017-05-08 LAB — CBC
HEMATOCRIT: 27 % — AB (ref 39.0–52.0)
Hemoglobin: 8.7 g/dL — ABNORMAL LOW (ref 13.0–17.0)
MCH: 30.1 pg (ref 26.0–34.0)
MCHC: 32.2 g/dL (ref 30.0–36.0)
MCV: 93.4 fL (ref 78.0–100.0)
Platelets: 434 10*3/uL — ABNORMAL HIGH (ref 150–400)
RBC: 2.89 MIL/uL — ABNORMAL LOW (ref 4.22–5.81)
RDW: 15.2 % (ref 11.5–15.5)
WBC: 10.5 10*3/uL (ref 4.0–10.5)

## 2017-05-08 LAB — MAGNESIUM: Magnesium: 1.9 mg/dL (ref 1.7–2.4)

## 2017-05-08 LAB — PROTIME-INR
INR: 1.8
Prothrombin Time: 21.1 seconds — ABNORMAL HIGH (ref 11.4–15.2)

## 2017-05-08 LAB — BLOOD GAS, ARTERIAL
Acid-Base Excess: 8.5 mmol/L — ABNORMAL HIGH (ref 0.0–2.0)
Bicarbonate: 32.1 mmol/L — ABNORMAL HIGH (ref 20.0–28.0)
Drawn by: 418751
FIO2: 40
MECHVT: 580 mL
O2 Saturation: 91.1 %
PEEP: 5 cmH2O
Patient temperature: 98.6
RATE: 16 resp/min
pCO2 arterial: 40.8 mmHg (ref 32.0–48.0)
pH, Arterial: 7.508 — ABNORMAL HIGH (ref 7.350–7.450)
pO2, Arterial: 58.8 mmHg — ABNORMAL LOW (ref 83.0–108.0)

## 2017-05-08 LAB — HEPARIN LEVEL (UNFRACTIONATED)
Heparin Unfractionated: 0.13 IU/mL — ABNORMAL LOW (ref 0.30–0.70)
Heparin Unfractionated: <0.1 IU/mL — ABNORMAL LOW (ref 0.30–0.70)
Heparin Unfractionated: <0.1 IU/mL — ABNORMAL LOW (ref 0.30–0.70)

## 2017-05-08 LAB — PROCALCITONIN: Procalcitonin: 0.5 ng/mL

## 2017-05-08 MED ORDER — ACETAMINOPHEN 160 MG/5ML PO SOLN
650.0000 mg | ORAL | Status: DC | PRN
  Administered 2017-05-08 – 2017-05-12 (×4): 650 mg
  Filled 2017-05-08 (×3): qty 20.3

## 2017-05-08 MED ORDER — FUROSEMIDE 10 MG/ML IJ SOLN
40.0000 mg | Freq: Once | INTRAMUSCULAR | Status: AC
  Administered 2017-05-08: 40 mg via INTRAVENOUS
  Filled 2017-05-08: qty 4

## 2017-05-08 MED ORDER — POTASSIUM CHLORIDE 10 MEQ/50ML IV SOLN
10.0000 meq | INTRAVENOUS | Status: AC
  Administered 2017-05-08 (×3): 10 meq via INTRAVENOUS
  Filled 2017-05-08 (×3): qty 50

## 2017-05-08 NOTE — Progress Notes (Signed)
1600 chem 8 - K was 7.1 - very unexpected - repeat was 3.6 - as expected dt trend and lasix, urine output.  K replacement protocol placed and 3 runs of K ordered and started.  Will continue to monitor closely and update as needed.

## 2017-05-08 NOTE — Progress Notes (Signed)
Nutrition Follow-up  INTERVENTION:   Continue:  Vital 1.5 @ 50 ml/hr (1200 ml/day) via Cortrak 30 ml Prostat BID Provides: 2000 kcal, 111 grams protein, and 916 ml free water.   NUTRITION DIAGNOSIS:   Increased nutrient needs related to wound healing as evidenced by estimated needs. Ongoing.   GOAL:   Patient will meet greater than or equal to 90% of their needs Met.   MONITOR:   TF tolerance, I & O's, Labs  ASSESSMENT:   Pt with PMH of CHF, severe COPD, AVD, chronic anticoagulation, hyperlipidemia admitted with dilated ascending aorta s/p valve replacement 6/18.  Arrest 6/20.   Spoke with RN. Pt required bronch and re-intubation 6/26 for severe left-sided atelectasis with mucus plugging.  MV: 10.2 L/min Temp: 38.2  Medications reviewed and include: dulcolax, colace, lasix, magic mouthwash, reglan Bilateral chest tubes: 252 ml out x 24 hours  Diet Order:  Diet NPO time specified  Skin:  Reviewed, no issues  Last BM:  6/25  Height:   Ht Readings from Last 1 Encounters:  05/02/17 '5\' 10"'$  (1.778 m)    Weight:   Wt Readings from Last 1 Encounters:  05/08/17 147 lb 0.8 oz (66.7 kg)    Ideal Body Weight:  75.4 kg  BMI:  Body mass index is 21.1 kg/m.  Estimated Nutritional Needs:   Kcal:  2081  Protein:  100-115 grams  Fluid:  > 1.7 L/day  EDUCATION NEEDS:   No education needs identified at this time  [place], [place], [place] Pager [phone] After Hours Pager

## 2017-05-08 NOTE — Progress Notes (Signed)
SLP Cancellation Note  Patient Details Name: Lyfe Fitzke MRN: [id number] DOB: 01/08/1946   Cancelled treatment:       Reason Eval/Treat Not Completed: Medical issues which prohibited therapy  Ferdinand Lango MA, CCC-SLP [phone]  Ferdinand Lango Meryl 05/08/2017, 8:26 AM

## 2017-05-08 NOTE — Progress Notes (Signed)
ANTICOAGULATION CONSULT NOTE Pharmacy Consult for Heparin Indication: mechanical AVR  Allergies  Allergen Reactions  . No Known Allergies     Patient Measurements: Height: 5\' 10"  (177.8 cm) Weight: 141 lb 8.6 oz (64.2 kg) IBW/kg (Calculated) : 73   Vital Signs: Temp: 98.6 F (37 C) (06/27 0000) Temp Source: Oral (06/27 0000) BP: 105/55 (06/27 0000) Pulse Rate: 80 (06/27 0000)  Labs:  Recent Labs  05/05/17 0337  05/06/17 0338  05/06/17 1639 05/07/17 0401 05/07/17 1614 05/08/17 0015  HGB 7.0*  < > 9.3*  < > 9.2* 9.6* 8.2*  --   HCT 21.4*  < > 28.0*  < > 27.0* 29.3* 24.0*  --   PLT 210  --  230  --   --  333  --   --   LABPROT 24.6*  --  18.3*  --   --  18.7*  --   --   INR 2.17  --  1.51  --   --  1.55  --   --   HEPARINUNFRC  --   --   --   --   --   --   --  <0.10*  CREATININE 0.77  < > 0.95  < > 0.90 0.98 0.80  --   < > = values in this interval not displayed.  Estimated Creatinine Clearance: 75.8 mL/min (by C-G formula based on SCr of 0.8 mg/dL).  Assessment: 71 year old male with h/o CHF and mechanical AVR s/p ascending aorta replacement 04/29/17 for heparin  Goal of Therapy:  Heparin level = 0.3 - 0.5 units / ml Monitor platelets by anticoagulation protocol: Yes   Plan:  Increase Heparin 1050 units/hr Check heparin level in 8 hours.   Geannie Risen, PharmD, BCPS  05/08/2017,1:12 AM

## 2017-05-08 NOTE — Progress Notes (Signed)
ANTICOAGULATION CONSULT NOTE Pharmacy Consult for Heparin Indication: mechanical AVR  Allergies  Allergen Reactions  . No Known Allergies     Patient Measurements: Height: 5\' 10"  (177.8 cm) Weight: 147 lb 0.8 oz (66.7 kg) IBW/kg (Calculated) : 73   Vital Signs: Temp: 100.1 F (37.8 C) (06/27 1000) Temp Source: Oral (06/27 1000) BP: 114/53 (06/27 1030) Pulse Rate: 78 (06/27 1030)  Labs:  Recent Labs  05/06/17 0338  05/07/17 0401 05/07/17 1614 05/08/17 0015 05/08/17 0450 05/08/17 1013  HGB 9.3*  < > 9.6* 8.2*  --  8.7*  --   HCT 28.0*  < > 29.3* 24.0*  --  27.0*  --   PLT 230  --  333  --   --  434*  --   LABPROT 18.3*  --  18.7*  --   --  21.1*  --   INR 1.51  --  1.55  --   --  1.80  --   HEPARINUNFRC  --   --   --   --  <0.10*  --  <0.10*  CREATININE 0.95  < > 0.98 0.80  --  1.01  --   < > = values in this interval not displayed.  Estimated Creatinine Clearance: 62.4 mL/min (by C-G formula based on SCr of 1.01 mg/dL).  Assessment: 71 year old male with h/o CHF and mechanical AVR s/p ascending aorta replacement 04/29/17 for heparin  Heparin level still undetectable on 1050 units/hr. No bleeding issues noted per nursing, hgb stable in 8s, pltc normal.   Goal of Therapy:  Heparin level = 0.3 - 0.5 units / ml Monitor platelets by anticoagulation protocol: Yes   Plan:  Increase Heparin 1300 units/hr Check heparin level in 8 hours.   Sheppard Coil PharmD., BCPS Clinical Pharmacist Pager [phone] 05/08/2017 11:26 AM

## 2017-05-08 NOTE — Progress Notes (Signed)
Progress Note  Patient Name: Jack Joseph Date of Encounter: 05/08/2017  Primary Cardiologist: [place]  Subjective   Re-intubated, sedated  Inpatient Medications    Scheduled Meds: . amiodarone  200 mg Oral BID  . aspirin EC  81 mg Oral Daily   Or  . aspirin  81 mg Per Tube Daily  . bisacodyl  10 mg Oral Daily   Or  . bisacodyl  10 mg Rectal Daily  . chlorhexidine gluconate (MEDLINE KIT)  15 mL Mouth Rinse BID  . Chlorhexidine Gluconate Cloth  6 each Topical Daily  . docusate  200 mg Oral Daily  . feeding supplement (PRO-STAT SUGAR FREE 64)  30 mL Per Tube BID  . furosemide  40 mg Intravenous Once  . insulin aspart  0-24 Units Subcutaneous Q4H  . levalbuterol  0.63 mg Nebulization TID  . magic mouthwash  5 mL Oral TID  . mouth rinse  15 mL Mouth Rinse 10 times per day  . metoCLOPramide (REGLAN) injection  10 mg Intravenous Q6H  . pantoprazole sodium  40 mg Per Tube Daily  . sodium chloride flush  10-40 mL Intracatheter Q12H  . sodium chloride flush  3 mL Intravenous Q12H  . sodium chloride HYPERTONIC  4 mL Nebulization BID  . Warfarin - Physician Dosing Inpatient   Does not apply q1800   Continuous Infusions: . sodium chloride 10 mL/hr at 05/08/17 1200  . amiodarone Stopped (05/07/17 1100)  . dexmedetomidine (PRECEDEX) IV infusion 1 mcg/kg/hr (05/08/17 1426)  . DOPamine Stopped (05/07/17 0100)  . feeding supplement (VITAL 1.5 CAL) 1,000 mL (05/07/17 2118)  . heparin 1,300 Units/hr (05/08/17 1120)  . norepinephrine (LEVOPHED) Adult infusion 10 mcg/min (05/08/17 1228)  . piperacillin-tazobactam (ZOSYN)  IV Stopped (05/08/17 0330)  . potassium chloride    . vancomycin Stopped (05/08/17 1426)   PRN Meds: acetaminophen (TYLENOL) oral liquid 160 mg/5 mL, fentaNYL (SUBLIMAZE) injection, fentaNYL (SUBLIMAZE) injection, guaiFENesin, levalbuterol, ondansetron (ZOFRAN) IV, potassium chloride, sodium chloride flush, sodium chloride flush, sorbitol   Vital Signs      Vitals:   05/08/17 1228 05/08/17 1230 05/08/17 1300 05/08/17 1330  BP: (!) 96/55 121/63 (!) 97/51 (!) 82/43  Pulse:  79 80 80  Resp:  '17 17 18  '$ Temp:      TempSrc:      SpO2:  100% 100% 100%  Weight:      Height:        Intake/Output Summary (Last 24 hours) at 05/08/17 1356 Last data filed at 05/08/17 1223  Gross per 24 hour  Intake          1795.87 ml  Output             2087 ml  Net          -291.13 ml   Filed Weights   05/06/17 0345 05/07/17 0440 05/08/17 0500  Weight: 149 lb 14.6 oz (68 kg) 141 lb 8.6 oz (64.2 kg) 147 lb 0.8 oz (66.7 kg)    Telemetry    Currently A paced/VsensingSR, PAFib, occ PVCs, infrequent NSVT, longest 12 beats - Personally Reviewed  ECG    No new EKGs  Physical Exam   GEN: intibated and sedated   Neck: 6 cm JVD Cardiac: RRR, no murmurs, rubs, or gallops.  Respiratory: decreased breath sounds on left  GI: Soft, non-distended  MS: No edema; No deformity. Subcutaneous emphysema is present Neuro:  unable to assess  Psych: unable to assess  Labs  Chemistry Recent Labs Lab 05/02/17 0047  05/04/17 0429  05/06/17 0338  05/07/17 0401 05/07/17 1614 05/08/17 0450  NA 131*  < > 128*  < > 135  < > 134* 136 134*  K 3.8  < > 3.4*  < > 3.6  < > 3.6 3.8 4.1  CL 97*  < > 95*  < > 93*  < > 93* 92* 96*  CO2 25  < > 27  < > 29  --  33*  --  31  GLUCOSE 148*  < > 145*  < > 108*  < > 156* 212* 158*  BUN 11  < > 14  < > 24*  < > 27* 28* 28*  CREATININE 0.94  < > 0.77  < > 0.95  < > 0.98 0.80 1.01  CALCIUM 7.7*  < > 7.5*  < > 7.8*  --  8.1*  --  7.8*  PROT 5.0*  --  4.8*  --   --   --   --   --   --   ALBUMIN 3.0*  --  2.3*  --   --   --   --   --   --   AST 36  --  22  --   --   --   --   --   --   ALT 20  --  18  --   --   --   --   --   --   ALKPHOS 37*  --  49  --   --   --   --   --   --   BILITOT 0.7  --  0.8  --   --   --   --   --   --   GFRNONAA >60  < > >60  < > >60  --  >60  --  >60  GFRAA >60  < > >60  < > >60  --  >60  --  >60   ANIONGAP 9  < > 6  < > 13  --  8  --  7  < > = values in this interval not displayed.   Hematology Recent Labs Lab 05/06/17 0338  05/07/17 0401 05/07/17 1614 05/08/17 0450  WBC 5.2  --  9.2  --  10.5  RBC 3.09*  --  3.19*  --  2.89*  HGB 9.3*  < > 9.6* 8.2* 8.7*  HCT 28.0*  < > 29.3* 24.0* 27.0*  MCV 90.6  --  91.8  --  93.4  MCH 30.1  --  30.1  --  30.1  MCHC 33.2  --  32.8  --  32.2  RDW 15.8*  --  15.3  --  15.2  PLT 230  --  333  --  434*  < > = values in this interval not displayed.  Cardiac Enzymes  Recent Labs Lab 05/01/17 2005  TROPONINI 0.43*    Radiology    Ct Chest Wo Contrast Result Date: 05/07/2017 CLINICAL DATA:  71 year old male with diffuse subcutaneous emphysema and right pneumothorax. Evaluate thoracostomy tubes. Recent ascending aortic repair/graft. EXAM: CT CHEST WITHOUT CONTRAST TECHNIQUE: Multidetector CT imaging of the chest was performed following the standard protocol without IV contrast. COMPARISON:  05/07/2017 chest radiograph and prior studies. FINDINGS: Cardiovascular: Cardiomegaly and aortic valve replacement noted. Fluid within the anterior mediastinum and adjacent to the ascending aorta is likely postoperative. Coronary artery calcifications noted. Mediastinum/Nodes: No enlarged  lymph nodes. An endotracheal tube with tip 3 cm above the carina and NG tube entering the stomach noted. Diffuse pneumomediastinum noted. Lungs/Pleura: A large bore right thoracostomy tube enters at the 6 intracostal space and extends superiorly and posteriorly within the major fissure with tip posteriorly located at the T5-T6 level. A smaller bore right thoracostomy pigtail catheter enters anteriorly at the second intercostal space and is coiled in the anterior pleural space at the T5-6 level. A 5-10% anterior right pneumothorax is noted. Severe subcutaneous emphysema bilaterally identified. A moderate to large left pleural effusion and left lower lung atelectasis noted.  Scattered areas of atelectasis within the right lower lobe noted. Upper Abdomen: No acute abnormality. Musculoskeletal: There are fractures of the lateral right second through sixth ribs. Fractures of the IMPRESSION: 5-10% anterior right pneumothorax with 2 right thoracostomy tubes as described. Diffuse subcutaneous emphysema and pneumomediastinum. Fractures of the right second through sixth ribs. Moderate to large left pleural effusion and left lower lung atelectasis. Scattered right lower lobe atelectasis. Postoperative changes and other support apparatus as described. Electronically Signed   By: Harmon Pier M.D.   On: 05/07/2017 20:35   Dg Chest Port 1 View Result Date: 05/08/2017 CLINICAL DATA:  Intubation. EXAM: PORTABLE CHEST 1 VIEW COMPARISON:  CT 05/07/2017.  Chest x-ray 05/07/2017. FINDINGS: Endotracheal tube, feeding tube, 2 right chest tubes in stable position. Persistent small residual pneumothorax. Diffuse bilateral subcutaneous emphysema is again noted. Prior cardiac valve replacement. Stable cardiomegaly. Persistent atelectasis/ consolidation left lung base. Persistent small left pleural effusion . Multiple right rib fractures again noted. IMPRESSION: 1. Lines and tubes including 2 right chest tubes in stable position. Persistent small right sided pneumothorax. Diffuse bilateral subcutaneous emphysema is again noted. Multiple right rib fractures again noted. 2. Persist atelectasis/ consolidation left lung base. Persistent small left pleural effusions. 3.  Prior cardiac valve replacement.  Stable cardiomegaly. Electronically Signed   By: Maisie Fus  Register   On: 05/08/2017 08:01     Cardiac Studies   05/01/17: TTE Study Conclusions - Left ventricle: The cavity size was normal. Wall thickness was   normal. Systolic function was severely reduced. The estimated   ejection fraction was in the range of 10% to 15%. Doppler   parameters are consistent with abnormal left ventricular   relaxation  (grade 1 diastolic dysfunction). - Aortic valve: A mechanical prosthesis was present. - Mitral valve: There was moderate regurgitation. - Left atrium: The atrium was mildly dilated. - Right ventricle: Systolic function was severely reduced. - Right atrium: The atrium was mildly dilated. - Tricuspid valve: There was mild-moderate regurgitation. - Pericardium, extracardiac: There was a left pleural effusion.  Patient Profile     71 y.o. male admitted with ascending aortic aneurysm s/p resection with post op VT, VDRF, shock, severe LV dysfunction, and left lung field collapse, SQ emphysema  Assessment & Plan    1. Respiratory failure - re-intubated yesterday post bronch  2. VT - he is stable off amio gtt on (per tube) amiodarone, brief NSVT     K+ 4.1, Mag 1.9     consistently pacing via temp device since intubation/sedation  3. Acute on chronic systolic heart failure     CHF team on board  4. Febrile this AM     Tmax today 102.9      nml WBC     Signed, Renee Norberto Sorenson, PA-C   EP attending  Patient seen and examined. Agree with the findings as documented above. He unfortunately has  redeveloped respiratory failure and required intubation after bronchoscopy yesterday. The patient's ventricular tachycardia has been reasonably well controlled. He is required no defibrillation. His heart failure team is following his volume status. Unfortunately he has had a fever, and findings worrisome for pneumonia. He is on appropriate antibiotic therapy. Overall his prognosis is guarded. I suspect he will be in need of a life vest if he improves, and prior to any ICD insertion.  Cristopher Peru, M.D. 05/08/2017, 1:56 PM  Patient ID: [name], male   DOB: January 28, 1946, 71 y.o.   MRN: [id number]

## 2017-05-08 NOTE — Progress Notes (Signed)
Patient ID: Jack Joseph, male   DOB: January 19, 1946, 71 y.o.   MRN: [id number]    Advanced Heart Failure Rounding Note   Subjective:    6/21 extubated but re-intubated that night. Extubated again 6/24 to 12 liters oxygen.   Yesterday he had bronch and later required reintubation.   Remains sedated.   Objective:   Weight Range:  Vital Signs:   Temp:  [97.6 F (36.4 C)-102.9 F (39.4 C)] 102.9 F (39.4 C) (06/27 0700) Pulse Rate:  [29-99] 78 (06/27 0830) Resp:  [12-35] 20 (06/27 0830) BP: (79-162)/(40-98) 82/44 (06/27 0830) SpO2:  [81 %-100 %] 100 % (06/27 0830) FiO2 (%):  [40 %-100 %] 40 % (06/27 0827) Weight:  [147 lb 0.8 oz (66.7 kg)] 147 lb 0.8 oz (66.7 kg) (06/27 0500) Last BM Date: 05/06/17  Weight change: Filed Weights   05/06/17 0345 05/07/17 0440 05/08/17 0500  Weight: 149 lb 14.6 oz (68 kg) 141 lb 8.6 oz (64.2 kg) 147 lb 0.8 oz (66.7 kg)    Intake/Output:   Intake/Output Summary (Last 24 hours) at 05/08/17 0856 Last data filed at 05/08/17 0800  Gross per 24 hour  Intake          1815.98 ml  Output             1262 ml  Net           553.98 ml     Physical Exam: CVP 5 General:  Intubated/ sedated. HEENT: normal Neck: supple. no JVD. Carotids 2+ bilat; no bruits. No lymphadenopathy or thryomegaly appreciated. Cor: PMI nondisplaced. Regular rate & rhythm. No rubs, gallops or murmurs. Lungs: Decreased in the bases. CT x2.  Abdomen: soft, nontender, nondistended. No hepatosplenomegaly. No bruits or masses. Good bowel sounds. Extremities: no cyanosis, clubbing, rash, edema Neuro: sedated.     Telemetry: Personlly reviewed. NSR 90s.    Labs: Basic Metabolic Panel:  Recent Labs Lab 05/03/17 1845 05/04/17 0429  05/05/17 3094  05/06/17 0338 05/06/17 1629 05/06/17 1639 05/07/17 0401 05/07/17 1614 05/08/17 0450  NA  --  128*  < > 129*  < > 135 114* 132* 134* 136 134*  K  --  3.4*  < > 3.7  < > 3.6 2.8* 3.5 3.6 3.8 4.1  CL  --  95*  < > 96*  < >  93* 77* 92* 93* 92* 96*  CO2  --  27  --  27  --  29  --   --  33*  --  31  GLUCOSE  --  145*  < > 122*  < > 108* >700* 238* 156* 212* 158*  BUN  --  14  < > 22*  < > 24* 20 25* 27* 28* 28*  CREATININE  --  0.77  < > 0.77  < > 0.95 0.60* 0.90 0.98 0.80 1.01  CALCIUM  --  7.5*  --  7.7*  --  7.8*  --   --  8.1*  --  7.8*  MG 2.3 2.0  --  1.9  --  1.7  --   --   --   --  1.9  PHOS 2.9 2.7  --   --   --   --   --   --   --   --   --   < > = values in this interval not displayed.  Liver Function Tests:  Recent Labs Lab 05/02/17 0047 05/04/17 0429  AST 36 22  ALT  20 18  ALKPHOS 37* 49  BILITOT 0.7 0.8  PROT 5.0* 4.8*  ALBUMIN 3.0* 2.3*   No results for input(s): LIPASE, AMYLASE in the last 168 hours. No results for input(s): AMMONIA in the last 168 hours.  CBC:  Recent Labs Lab 05/04/17 0429  05/05/17 0337  05/06/17 0338 05/06/17 1629 05/06/17 1639 05/07/17 0401 05/07/17 1614 05/08/17 0450  WBC 7.1  --  5.3  --  5.2  --   --  9.2  --  10.5  HGB 7.9*  < > 7.0*  < > 9.3* 7.5* 9.2* 9.6* 8.2* 8.7*  HCT 23.2*  < > 21.4*  < > 28.0* 22.0* 27.0* 29.3* 24.0* 27.0*  MCV 91.7  --  91.5  --  90.6  --   --  91.8  --  93.4  PLT 188  --  210  --  230  --   --  333  --  434*  < > = values in this interval not displayed.  Cardiac Enzymes:  Recent Labs Lab 05/01/17 1314 05/01/17 2005  TROPONINI 0.44* 0.43*    BNP: BNP (last 3 results) No results for input(s): BNP in the last 8760 hours.  ProBNP (last 3 results) No results for input(s): PROBNP in the last 8760 hours.    Other results:  Imaging: Ct Chest Wo Contrast  Result Date: 05/07/2017 CLINICAL DATA:  71 year old male with diffuse subcutaneous emphysema and right pneumothorax. Evaluate thoracostomy tubes. Recent ascending aortic repair/graft. EXAM: CT CHEST WITHOUT CONTRAST TECHNIQUE: Multidetector CT imaging of the chest was performed following the standard protocol without IV contrast. COMPARISON:  05/07/2017 chest  radiograph and prior studies. FINDINGS: Cardiovascular: Cardiomegaly and aortic valve replacement noted. Fluid within the anterior mediastinum and adjacent to the ascending aorta is likely postoperative. Coronary artery calcifications noted. Mediastinum/Nodes: No enlarged lymph nodes. An endotracheal tube with tip 3 cm above the carina and NG tube entering the stomach noted. Diffuse pneumomediastinum noted. Lungs/Pleura: A large bore right thoracostomy tube enters at the 6 intracostal space and extends superiorly and posteriorly within the major fissure with tip posteriorly located at the T5-T6 level. A smaller bore right thoracostomy pigtail catheter enters anteriorly at the second intercostal space and is coiled in the anterior pleural space at the T5-6 level. A 5-10% anterior right pneumothorax is noted. Severe subcutaneous emphysema bilaterally identified. A moderate to large left pleural effusion and left lower lung atelectasis noted. Scattered areas of atelectasis within the right lower lobe noted. Upper Abdomen: No acute abnormality. Musculoskeletal: There are fractures of the lateral right second through sixth ribs. Fractures of the IMPRESSION: 5-10% anterior right pneumothorax with 2 right thoracostomy tubes as described. Diffuse subcutaneous emphysema and pneumomediastinum. Fractures of the right second through sixth ribs. Moderate to large left pleural effusion and left lower lung atelectasis. Scattered right lower lobe atelectasis. Postoperative changes and other support apparatus as described. Electronically Signed   By: Margarette Canada M.D.   On: 05/07/2017 20:35   Dg Chest Port 1 View  Result Date: 05/08/2017 CLINICAL DATA:  Intubation. EXAM: PORTABLE CHEST 1 VIEW COMPARISON:  CT 05/07/2017.  Chest x-ray 05/07/2017. FINDINGS: Endotracheal tube, feeding tube, 2 right chest tubes in stable position. Persistent small residual pneumothorax. Diffuse bilateral subcutaneous emphysema is again noted. Prior  cardiac valve replacement. Stable cardiomegaly. Persistent atelectasis/ consolidation left lung base. Persistent small left pleural effusion . Multiple right rib fractures again noted. IMPRESSION: 1. Lines and tubes including 2 right chest tubes in stable position. Persistent  small right sided pneumothorax. Diffuse bilateral subcutaneous emphysema is again noted. Multiple right rib fractures again noted. 2. Persist atelectasis/ consolidation left lung base. Persistent small left pleural effusions. 3.  Prior cardiac valve replacement.  Stable cardiomegaly. Electronically Signed   By: Marcello Moores  Register   On: 05/08/2017 08:01   Dg Chest Port 1 View  Result Date: 05/07/2017 CLINICAL DATA:  Status posttracheal intubation. EXAM: PORTABLE CHEST 1 VIEW COMPARISON:  Portable chest x-ray of earlier this afternoon. FINDINGS: The endotracheal tube tip is approximately 5.8 cm above the carina. The lungs are adequately inflated. The tiny right-sided pneumothorax is nearly imperceptible. The 2 right-sided chest tubes are in stable position. The left retrocardiac region remains dense. Extensive subcutaneous emphysema persists. The right-sided PICC line and the feeding tube appears stable. IMPRESSION: The endotracheal tube tip lies approximately 5.8 cm above the carina. The remainder of the study is unchanged. Electronically Signed   By: David  Martinique M.D.   On: 05/07/2017 14:36   Dg Chest Port 1 View  Result Date: 05/07/2017 CLINICAL DATA:  Atelectasis ; right pneumothorax EXAM: PORTABLE CHEST 1 VIEW COMPARISON:  Portable chest x-rays of today's date FINDINGS: The lungs arm are adequately inflated. A tiny right pneumothorax is visible. The 2 right-sided chest tubes are in stable position. The retrocardiac region on the left remains dense with obscuration of the hemidiaphragm. The heart is normal in size. The pulmonary vascularity is not engorged. A large amount of subcutaneous emphysema is again demonstrated over the chest  and axillary regions. There are rib fractures on the right. A feeding tube is present whose tip projects below the inferior margin of the image. IMPRESSION: Fairly stable approximately 5% pneumothorax on the right. The 2 right-sided chest tubes are in stable position. Persistent left lower lobe atelectasis or pneumonia with small left pleural effusion. Extensive subcutaneous emphysema overlying the thorax. Electronically Signed   By: David  Martinique M.D.   On: 05/07/2017 13:56   Dg Chest Port 1 View  Result Date: 05/07/2017 CLINICAL DATA:  Post bronchoscopy, chest tubes EXAM: PORTABLE CHEST 1 VIEW COMPARISON:  Portable exam 1108 hours compared to 0724 hours FINDINGS: Feeding tube traverses esophagus into abdomen. RIGHT arm PICC line tip projects over SVC. Pair of RIGHT thoracostomy tubes with small residual RIGHT apex pneumothorax. Enlargement of cardiac silhouette post AVR. Extensive consolidation LEFT lower lobe with associated LEFT pleural effusion. Atherosclerotic calcification aorta. Scattered atelectasis RIGHT lung base. IMPRESSION: Small residual RIGHT pneumothorax despite pair of RIGHT thoracostomy tubes. Persistent LEFT lower lobe consolidation with LEFT pleural effusion. Aortic Atherosclerosis (ICD10-I70.0). Electronically Signed   By: Lavonia Dana M.D.   On: 05/07/2017 11:26   Dg Chest Port 1 View  Result Date: 05/07/2017 CLINICAL DATA:  Bilateral pneumothoraces, chest tube treatment. EXAM: PORTABLE CHEST 1 VIEW COMPARISON:  Portable chest x-ray of May 07, 2017 at 2:20 4 a.m. FINDINGS: There remains extensive subcutaneous emphysema over the thorax. A large caliber chest tube on the right just lateral to the small caliber chest tube. The tip of the large caliber tube overlies the posterior right fifth and sixth rib interspace. Fractures of the lateral ribs superiorly are demonstrated. There is a tiny apical pneumothorax on the right. On the left there is air adjacent to the aortic arch which likely  reflects pneumomediastinum or small very medial pleural effusion. Opacity the opacification of much of the left lung persists but has not significantly worsened since yesterday's study. Shift of the mediastinum toward the left has occurred but is  accentuated by patient rotation. The cardiac silhouette is obscured. The pulmonary vascularity visible on the right does not appear engorged. The sternal wires are intact. The feeding tube tip projects below the inferior margin of the image. IMPRESSION: Stable appearance of the chest since the earlier study. Persistent small right pneumothorax. Small amount of pneumomediastinum versus small medial pneumothorax on the left. There is mediastinal shift toward the left which is stable. Stable opacification of much of the mid and lower left lung. Electronically Signed   By: David  Martinique M.D.   On: 05/07/2017 07:42   Dg Chest Port 1 View  Result Date: 05/07/2017 CLINICAL DATA:  71 y/o  M; chest tube in situ. EXAM: PORTABLE CHEST 1 VIEW COMPARISON:  05/06/2017 chest radiograph FINDINGS: Worsening aeration of the left lung with near complete collapse and moderate left-sided pleural effusion. Lucency surrounding the aortic knob suggests trace left-sided pneumothorax. Extensive subcutaneous emphysema over the right chest wall obscures the lung. Trace right apical pneumothorax. Two chest tubes on the right in situ. Enteric tube tip extends below the field of view into the abdomen. Displaced right second through fourth rib fractures. IMPRESSION: 1. Worsening aeration of left lung with near complete collapse and moderate left effusion. 2. Lucency around aortic knob suggest trace left pneumothorax. 3. Stable small right pneumothorax. 4. Stable lines and tubes. Electronically Signed   By: Kristine Garbe M.D.   On: 05/07/2017 02:59   Dg Abd Portable 1v  Result Date: 05/07/2017 CLINICAL DATA:  Feeding tube placement EXAM: PORTABLE ABDOMEN - 1 VIEW COMPARISON:  05/03/2017  FINDINGS: Consolidation in the left lung base. Feeding tube tip overlies the second portion of the duodenum. Gas-filled bowel loops but without obstructive pattern. IMPRESSION: 1. Esophageal tube tip overlies the second portion of the duodenum 2. Consolidation at the left lung base Electronically Signed   By: Donavan Foil M.D.   On: 05/07/2017 02:55     Medications:     Scheduled Medications: . amiodarone  200 mg Oral BID  . aspirin EC  81 mg Oral Daily   Or  . aspirin  81 mg Per Tube Daily  . bisacodyl  10 mg Oral Daily   Or  . bisacodyl  10 mg Rectal Daily  . chlorhexidine gluconate (MEDLINE KIT)  15 mL Mouth Rinse BID  . Chlorhexidine Gluconate Cloth  6 each Topical Daily  . docusate  200 mg Oral Daily  . feeding supplement (PRO-STAT SUGAR FREE 64)  30 mL Per Tube BID  . furosemide  40 mg Intravenous Once  . furosemide  40 mg Intravenous Once  . insulin aspart  0-24 Units Subcutaneous Q4H  . levalbuterol  0.63 mg Nebulization TID  . magic mouthwash  5 mL Oral TID  . mouth rinse  15 mL Mouth Rinse 10 times per day  . metoCLOPramide (REGLAN) injection  10 mg Intravenous Q6H  . pantoprazole sodium  40 mg Per Tube Daily  . sodium chloride flush  10-40 mL Intracatheter Q12H  . sodium chloride flush  3 mL Intravenous Q12H  . sodium chloride HYPERTONIC  4 mL Nebulization BID  . Warfarin - Physician Dosing Inpatient   Does not apply q1800    Infusions: . sodium chloride Stopped (05/07/17 1436)  . amiodarone Stopped (05/07/17 1100)  . dexmedetomidine (PRECEDEX) IV infusion 1 mcg/kg/hr (05/08/17 0800)  . DOPamine Stopped (05/07/17 0100)  . feeding supplement (VITAL 1.5 CAL) 1,000 mL (05/07/17 2118)  . heparin 1,050 Units/hr (05/08/17 0122)  . norepinephrine (  LEVOPHED) Adult infusion 15 mcg/min (05/08/17 0553)  . piperacillin-tazobactam (ZOSYN)  IV Stopped (05/08/17 0330)  . potassium chloride    . vancomycin Stopped (05/08/17 0111)    PRN Medications: acetaminophen (TYLENOL)  oral liquid 160 mg/5 mL, fentaNYL (SUBLIMAZE) injection, fentaNYL (SUBLIMAZE) injection, guaiFENesin, levalbuterol, ondansetron (ZOFRAN) IV, potassium chloride, sodium chloride flush, sodium chloride flush, sorbitol   Assessment:   1. VT arrest: Episodes NSVT. amio 200 mg twice a day. EP following.  - Plan ICD prior to d/c 2. 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  EF now down to 10-15% from 25-30% pre-op. Suspect acute drop related to recent cardiac surgery, bypass, and arrest.  Back on norepi with sedation. Diurese with IV lasix. Todays CO-OX is 64%.  Renal function stable.   3. 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  4. Acute hypoxic respiratory failure: Has severe COPD, also with considerable LLL volume loss/atelectasis. Extubated 6/21 but re-intubated. Extubated 6/24//18. Reintubated 05/07/17.   5. R PTX: R apical pneumo with chest tube x2 in place. S/P Bronch 6/26.  6. 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   7. Acute delirium 8. Dysphagia- reintubated 6/26/   Length of Stay: [place]  NP-C  05/08/2017, 8:56 AM  Advanced Heart Failure Team Pager [phone] (M-F; 7a - 4p)  Please contact [facility] for night-coverage after hours (4p -7a ) and weekends on [email or web address]  Patient seen and examined with Darrick Grinder, NP. We discussed all aspects of the encounter. I agree with the assessment and plan as stated above.   He remains very tenuous from respiratory status. Now re-intubated.   Volume status actually looks ok as does co-ox. Suspect this primarily pulmonary due to severe COPD, PTX and atx. Not much more for [place] to add at this point. Can give lasix as needed to keep CVP < 10.   We will follow at a distance. Prognosis appears poor at this point.   Glori Bickers, MD  6:06 PM

## 2017-05-08 NOTE — Progress Notes (Addendum)
Patient ID: Emmons Laton, male   DOB: 1946/02/23, 71 y.o.   MRN: [id number] EVENING ROUNDS NOTE :     [address]       [place] [id number]             [phone]                 9 Days Post-Op Procedure(s) (LRB): REDO STERNOTOMY (N/A) REPLACEMENT ASCENDING AORTA/ WHEAT PROCEDURE, HYPOTHERMIC CIRCULATORY ARREST AND CARDIOPULMONARY BYPASS, AND USE OF HEMASHIELD PLATINUM WOVEN DOUBLE VELOUR VASCULAR GRAFT 32 MM X 50 CM (N/A) TRANSESOPHAGEAL ECHOCARDIOGRAM (TEE) (N/A)  Total Length of Stay:  LOS: 9 days  BP (!) 104/54 (BP Location: Left Arm)   Pulse 80   Temp 99 F (37.2 C) (Oral)   Resp (!) 21   Ht 5\' 10"  (1.778 m)   Wt 147 lb 0.8 oz (66.7 kg)   SpO2 100%   BMI 21.10 kg/m   .Intake/Output      06/26 0701 - 06/27 0700 06/27 0701 - 06/28 0700   I.V. (mL/kg) 758.7 (11.4) 411 (6.2)   NG/GT 635 50   IV Piggyback 350 400   Total Intake(mL/kg) 1743.7 (26.1) 861 (12.9)   Urine (mL/kg/hr) 1260 (0.8) 1525 (1.9)   Stool     Chest Tube 252 (0.2) 0 (0)   Total Output 1512 1525   Net +231.7 -664          . sodium chloride 10 mL/hr at 05/08/17 1200  . amiodarone Stopped (05/07/17 1100)  . dexmedetomidine (PRECEDEX) IV infusion 1 mcg/kg/hr (05/08/17 1600)  . DOPamine Stopped (05/07/17 0100)  . feeding supplement (VITAL 1.5 CAL) 1,000 mL (05/08/17 1600)  . heparin 1,300 Units/hr (05/08/17 1626)  . norepinephrine (LEVOPHED) Adult infusion 5 mcg/min (05/08/17 1839)  . piperacillin-tazobactam (ZOSYN)  IV Stopped (05/08/17 1918)  . potassium chloride    . potassium chloride Stopped (05/08/17 1915)  . vancomycin Stopped (05/08/17 1426)     Lab Results  Component Value Date   WBC 10.5 05/08/2017   HGB 8.2 (L) 05/08/2017   HCT 24.0 (L) 05/08/2017   PLT 434 (H) 05/08/2017   GLUCOSE 177 (H) 05/08/2017   CHOL 199 01/22/2017   TRIG 71 01/22/2017   HDL 64 01/22/2017   LDLCALC 121 (H) 01/22/2017   ALT 18 05/04/2017   AST 22 05/04/2017   NA 136 05/08/2017   K  3.6 05/08/2017   CL 92 (L) 05/08/2017   CREATININE 1.10 05/08/2017   BUN 29 (H) 05/08/2017   CO2 31 05/08/2017   TSH 5.390 (H) 01/22/2017   INR 1.80 05/08/2017   HGBA1C 5.7 (H) 04/25/2017   Fever this am, wbc normal on iv antibiotics  Stable on vent today Decreased subq air   [name] MD  Beeper [phone] Office [phone] 05/08/2017 6:59 PM

## 2017-05-08 NOTE — Progress Notes (Signed)
Patient ID: Jack Joseph, male   DOB: July 22, 1946, 71 y.o.   MRN: [id number] TCTS DAILY ICU PROGRESS NOTE                   [place]            [employer] [id number]          [phone]   9 Days Post-Op Procedure(s) (LRB): REDO STERNOTOMY (N/A) REPLACEMENT ASCENDING AORTA/ WHEAT PROCEDURE, HYPOTHERMIC CIRCULATORY ARREST AND CARDIOPULMONARY BYPASS, AND USE OF HEMASHIELD PLATINUM WOVEN DOUBLE VELOUR VASCULAR GRAFT 32 MM X 50 CM (N/A) TRANSESOPHAGEAL ECHOCARDIOGRAM (TEE) (N/A)  Total Length of Stay:  LOS: 9 days   Subjective: Sedated on vent   Objective: Vital signs in last 24 hours: Temp:  [97.6 F (36.4 C)-102.9 F (39.4 C)] 102.9 F (39.4 C) (06/27 0700) Pulse Rate:  [29-99] 78 (06/27 0730) Cardiac Rhythm: Normal sinus rhythm (06/27 0730) Resp:  [12-35] 21 (06/27 0730) BP: (79-162)/(40-98) 95/47 (06/27 0730) SpO2:  [81 %-100 %] 100 % (06/27 0730) FiO2 (%):  [40 %-100 %] 40 % (06/27 0700) Weight:  [147 lb 0.8 oz (66.7 kg)] 147 lb 0.8 oz (66.7 kg) (06/27 0500)  Filed Weights   05/06/17 0345 05/07/17 0440 05/08/17 0500  Weight: 149 lb 14.6 oz (68 kg) 141 lb 8.6 oz (64.2 kg) 147 lb 0.8 oz (66.7 kg)    Weight change: 5 lb 8.2 oz (2.5 kg)   Hemodynamic parameters for last 24 hours: CVP:  [4 mmHg-20 mmHg] 5 mmHg  Intake/Output from previous day: 06/26 0701 - 06/27 0700 In: 1743.7 [I.V.:758.7; NG/GT:635; IV Piggyback:350] Out: 8527 [Urine:1260; Chest Tube:252]  Intake/Output this shift: No intake/output data recorded.  Current Meds: Scheduled Meds: . amiodarone  200 mg Oral BID  . aspirin EC  81 mg Oral Daily   Or  . aspirin  81 mg Per Tube Daily  . bisacodyl  10 mg Oral Daily   Or  . bisacodyl  10 mg Rectal Daily  . chlorhexidine gluconate (MEDLINE KIT)  15 mL Mouth Rinse BID  . Chlorhexidine Gluconate Cloth  6 each Topical Daily  . docusate  200 mg Oral Daily  . feeding supplement (PRO-STAT SUGAR FREE 64)  30 mL Per Tube BID  .  furosemide  40 mg Intravenous Once  . insulin aspart  0-24 Units Subcutaneous Q4H  . levalbuterol  0.63 mg Nebulization TID  . magic mouthwash  5 mL Oral TID  . mouth rinse  15 mL Mouth Rinse 10 times per day  . metoCLOPramide (REGLAN) injection  10 mg Intravenous Q6H  . pantoprazole sodium  40 mg Per Tube Daily  . sodium chloride flush  10-40 mL Intracatheter Q12H  . sodium chloride flush  3 mL Intravenous Q12H  . sodium chloride HYPERTONIC  4 mL Nebulization BID  . Warfarin - Physician Dosing Inpatient   Does not apply q1800   Continuous Infusions: . sodium chloride Stopped (05/07/17 1436)  . amiodarone Stopped (05/07/17 1100)  . dexmedetomidine (PRECEDEX) IV infusion 0.4 mcg/kg/hr (05/08/17 0730)  . DOPamine Stopped (05/07/17 0100)  . feeding supplement (VITAL 1.5 CAL) 1,000 mL (05/07/17 2118)  . heparin 1,050 Units/hr (05/08/17 0122)  . norepinephrine (LEVOPHED) Adult infusion 15 mcg/min (05/08/17 0553)  . piperacillin-tazobactam (ZOSYN)  IV Stopped (05/08/17 0330)  . potassium chloride    . vancomycin Stopped (05/08/17 0111)   PRN Meds:.acetaminophen (TYLENOL) oral liquid 160 mg/5 mL, fentaNYL (SUBLIMAZE) injection, fentaNYL (SUBLIMAZE) injection, guaiFENesin, levalbuterol, ondansetron (  ZOFRAN) IV, potassium chloride, sodium chloride flush, sodium chloride flush, sorbitol  General appearance: sedated on vent  Neurologic: intact Heart: regular rate and rhythm, S1, S2 normal, no murmur, click, rub or gallop Lungs: diminished breath sounds bilaterally Abdomen: soft, non-tender; bowel sounds normal; no masses,  no organomegaly Extremities: extremities normal, atraumatic, no cyanosis or edema and Homans sign is negative, no sign of DVT Wound: sternum inatct, fractured ribs laterial on ct   Lab Results: CBC: Recent Labs  05/07/17 0401 05/07/17 1614 05/08/17 0450  WBC 9.2  --  10.5  HGB 9.6* 8.2* 8.7*  HCT 29.3* 24.0* 27.0*  PLT 333  --  434*   BMET:  Recent Labs   05/07/17 0401 05/07/17 1614 05/08/17 0450  NA 134* 136 134*  K 3.6 3.8 4.1  CL 93* 92* 96*  CO2 33*  --  31  GLUCOSE 156* 212* 158*  BUN 27* 28* 28*  CREATININE 0.98 0.80 1.01  CALCIUM 8.1*  --  7.8*    CMET: Lab Results  Component Value Date   WBC 10.5 05/08/2017   HGB 8.7 (L) 05/08/2017   HCT 27.0 (L) 05/08/2017   PLT 434 (H) 05/08/2017   GLUCOSE 158 (H) 05/08/2017   CHOL 199 01/22/2017   TRIG 71 01/22/2017   HDL 64 01/22/2017   LDLCALC 121 (H) 01/22/2017   ALT 18 05/04/2017   AST 22 05/04/2017   NA 134 (L) 05/08/2017   K 4.1 05/08/2017   CL 96 (L) 05/08/2017   CREATININE 1.01 05/08/2017   BUN 28 (H) 05/08/2017   CO2 31 05/08/2017   TSH 5.390 (H) 01/22/2017   INR 1.80 05/08/2017   HGBA1C 5.7 (H) 04/25/2017      PT/INR:  Recent Labs  05/08/17 0450  LABPROT 21.1*  INR 1.80   Radiology: Ct Chest Wo Contrast  Result Date: 05/07/2017 CLINICAL DATA:  71 year old male with diffuse subcutaneous emphysema and right pneumothorax. Evaluate thoracostomy tubes. Recent ascending aortic repair/graft. EXAM: CT CHEST WITHOUT CONTRAST TECHNIQUE: Multidetector CT imaging of the chest was performed following the standard protocol without IV contrast. COMPARISON:  05/07/2017 chest radiograph and prior studies. FINDINGS: Cardiovascular: Cardiomegaly and aortic valve replacement noted. Fluid within the anterior mediastinum and adjacent to the ascending aorta is likely postoperative. Coronary artery calcifications noted. Mediastinum/Nodes: No enlarged lymph nodes. An endotracheal tube with tip 3 cm above the carina and NG tube entering the stomach noted. Diffuse pneumomediastinum noted. Lungs/Pleura: A large bore right thoracostomy tube enters at the 6 intracostal space and extends superiorly and posteriorly within the major fissure with tip posteriorly located at the T5-T6 level. A smaller bore right thoracostomy pigtail catheter enters anteriorly at the second intercostal space and is  coiled in the anterior pleural space at the T5-6 level. A 5-10% anterior right pneumothorax is noted. Severe subcutaneous emphysema bilaterally identified. A moderate to large left pleural effusion and left lower lung atelectasis noted. Scattered areas of atelectasis within the right lower lobe noted. Upper Abdomen: No acute abnormality. Musculoskeletal: There are fractures of the lateral right second through sixth ribs. Fractures of the IMPRESSION: 5-10% anterior right pneumothorax with 2 right thoracostomy tubes as described. Diffuse subcutaneous emphysema and pneumomediastinum. Fractures of the right second through sixth ribs. Moderate to large left pleural effusion and left lower lung atelectasis. Scattered right lower lobe atelectasis. Postoperative changes and other support apparatus as described. Electronically Signed   By: Margarette Canada M.D.   On: 05/07/2017 20:35   Dg Chest Port 1  View  Result Date: 05/07/2017 CLINICAL DATA:  Status posttracheal intubation. EXAM: PORTABLE CHEST 1 VIEW COMPARISON:  Portable chest x-ray of earlier this afternoon. FINDINGS: The endotracheal tube tip is approximately 5.8 cm above the carina. The lungs are adequately inflated. The tiny right-sided pneumothorax is nearly imperceptible. The 2 right-sided chest tubes are in stable position. The left retrocardiac region remains dense. Extensive subcutaneous emphysema persists. The right-sided PICC line and the feeding tube appears stable. IMPRESSION: The endotracheal tube tip lies approximately 5.8 cm above the carina. The remainder of the study is unchanged. Electronically Signed   By: David  Martinique M.D.   On: 05/07/2017 14:36   Dg Chest Port 1 View  Result Date: 05/07/2017 CLINICAL DATA:  Atelectasis ; right pneumothorax EXAM: PORTABLE CHEST 1 VIEW COMPARISON:  Portable chest x-rays of today's date FINDINGS: The lungs arm are adequately inflated. A tiny right pneumothorax is visible. The 2 right-sided chest tubes are in  stable position. The retrocardiac region on the left remains dense with obscuration of the hemidiaphragm. The heart is normal in size. The pulmonary vascularity is not engorged. A large amount of subcutaneous emphysema is again demonstrated over the chest and axillary regions. There are rib fractures on the right. A feeding tube is present whose tip projects below the inferior margin of the image. IMPRESSION: Fairly stable approximately 5% pneumothorax on the right. The 2 right-sided chest tubes are in stable position. Persistent left lower lobe atelectasis or pneumonia with small left pleural effusion. Extensive subcutaneous emphysema overlying the thorax. Electronically Signed   By: David  Martinique M.D.   On: 05/07/2017 13:56   Dg Chest Port 1 View  Result Date: 05/07/2017 CLINICAL DATA:  Post bronchoscopy, chest tubes EXAM: PORTABLE CHEST 1 VIEW COMPARISON:  Portable exam 1108 hours compared to 0724 hours FINDINGS: Feeding tube traverses esophagus into abdomen. RIGHT arm PICC line tip projects over SVC. Pair of RIGHT thoracostomy tubes with small residual RIGHT apex pneumothorax. Enlargement of cardiac silhouette post AVR. Extensive consolidation LEFT lower lobe with associated LEFT pleural effusion. Atherosclerotic calcification aorta. Scattered atelectasis RIGHT lung base. IMPRESSION: Small residual RIGHT pneumothorax despite pair of RIGHT thoracostomy tubes. Persistent LEFT lower lobe consolidation with LEFT pleural effusion. Aortic Atherosclerosis (ICD10-I70.0). Electronically Signed   By: Lavonia Dana M.D.   On: 05/07/2017 11:26     Assessment/Plan: S/P Procedure(s) (LRB): REDO STERNOTOMY (N/A) REPLACEMENT ASCENDING AORTA/ WHEAT PROCEDURE, HYPOTHERMIC CIRCULATORY ARREST AND CARDIOPULMONARY BYPASS, AND USE OF HEMASHIELD PLATINUM WOVEN DOUBLE VELOUR VASCULAR GRAFT 32 MM X 50 CM (N/A) TRANSESOPHAGEAL ECHOCARDIOGRAM (TEE) (N/A) Continue vent and nutrition support for now Do not plan on trach  currently Now on heparin pending restarting coumadin when safe to do  renal function stable  Sub q air present but decreasing      [name] 05/08/2017 7:48 AM

## 2017-05-08 NOTE — Progress Notes (Signed)
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  Underwent ascending aortic replacement/wheat procedure with CABG.  Was progressing well post op and ambulating in hall.  However just after midnight 6/20 had VT arrest with CPR and shock x 3.  Intubated during ACLS, small R ptx noted post CPR and CT placed by CVTS.  PCCM consulted for vent management.   SUBJECTIVE:  Bronch yesterday for Lt lung collapse He had better aeration on CXR but was delirious and agitation with resp failure requiring intubation later in the day. Appears calmer today on sedation. SQ air is getting better.   PAST MEDICAL HISTORY :  He  has a past medical history of AVD (aortic valve disease); Chronic anticoagulation; Dilated cardiomyopathy (HCC); Dysrhythmia; Hyperlipidemia; and LV dysfunction.  PAST SURGICAL HISTORY: He  has a past surgical history that includes Aortic valve replacement; doppler echocardiography (04/07/2002); Right/Left Heart Cath and Coronary Angiography (N/A, 03/05/2017); Bentall procedure (N/A, 04/29/2017); and TEE without cardioversion (N/A, 04/29/2017).  Allergies  Allergen Reactions  . No Known Allergies     No current facility-administered medications on file prior to encounter.    Current Outpatient Prescriptions on File Prior to Encounter  Medication Sig  . carvedilol (COREG) 3.125 MG tablet Take 2 tablets (6.25 mg total) by mouth 2 (two) times daily.  . colchicine 0.6 MG tablet Take 0.6 mg by mouth daily as needed.  Marland Kitchen losartan (COZAAR) 25 MG tablet Take  1 tablet (25 mg total) by mouth daily.  . methylcellulose (CITRUCEL) oral powder Take 1 packet by mouth 2 (two) times daily.   . sildenafil (REVATIO) 20 MG tablet TAKE 1-5 TABLETS BY MOUTH AS NEEDED FOR SEXUAL ACTIVITY (Patient not taking: Reported on 04/25/2017)  . warfarin (COUMADIN) 5 MG tablet Take 1 tablet by mouth daily or as directed by [facility] (Patient not taking: Reported on 04/25/2017)    FAMILY HISTORY:  His indicated that his mother is alive. He indicated that his father is deceased. He indicated that his brother is deceased. He indicated that his maternal grandmother is deceased. He indicated that his maternal grandfather is deceased. He indicated that his paternal grandmother is deceased. He indicated that his paternal grandfather is deceased.    SOCIAL HISTORY: He  reports that he quit smoking about 16 years ago. He has never used smokeless tobacco. He reports that he does not drink alcohol or use drugs.  REVIEW OF SYSTEMS:   Unable to obtain as pt is intubated   VITAL SIGNS: BP (!) 80/42 (BP Location: Left Arm)   Pulse 79   Temp 100.1 F (37.8 C) (Oral)   Resp 20   Ht 5\' 10"  (1.778 m)   Wt 147 lb 0.8 oz (66.7 kg)   SpO2 100%   BMI 21.10 kg/m   HEMODYNAMICS: CVP:  [4 mmHg-20 mmHg] 5 mmHg  VENTILATOR SETTINGS: Vent Mode: PRVC FiO2 (%):  [40 %-100 %] 40 % Set Rate:  [16 bmp] 16 bmp Vt Set:  [580 mL] 580 mL PEEP:  [5 cmH20] 5 cmH20 Plateau Pressure:  [40 [id number]  cmH20] 15 cmH20  INTAKE / OUTPUT: I/O last 3 completed shifts: In: 2922.6 [I.V.:987.6; NG/GT:1185; IV Piggyback:750] Out: 2412 [Urine:2160; Chest Tube:252]  PHYSICAL EXAMINATION: Gen:      No acute distress HEENT:  EOMI, sclera anicteric, ETT in place, face, chest and arm swelling with crepitius Neck:     No masses; no thyromegaly Lungs:    Coarse rhonchi; normal respiratory effort CV:         Regular rate and rhythm; no murmurs Abd:      + bowel sounds; soft, non-tender; no palpable  masses, no distension Ext:    No edema; adequate peripheral perfusion Skin:      Warm and dry; no rash Neuro: Sedated, no focal deficits.   LABS:  BMET  Recent Labs Lab 05/06/17 0338  05/07/17 0401 05/07/17 1614 05/08/17 0450  NA 135  < > 134* 136 134*  K 3.6  < > 3.6 3.8 4.1  CL 93*  < > 93* 92* 96*  CO2 29  --  33*  --  31  BUN 24*  < > 27* 28* 28*  CREATININE 0.95  < > 0.98 0.80 1.01  GLUCOSE 108*  < > 156* 212* 158*  < > = values in this interval not displayed.  Electrolytes  Recent Labs Lab 05/03/17 1845 05/04/17 0429 05/05/17 0337 05/06/17 0338 05/07/17 0401 05/08/17 0450  CALCIUM  --  7.5* 7.7* 7.8* 8.1* 7.8*  MG 2.3 2.0 1.9 1.7  --  1.9  PHOS 2.9 2.7  --   --   --   --     CBC  Recent Labs Lab 05/06/17 0338  05/07/17 0401 05/07/17 1614 05/08/17 0450  WBC 5.2  --  9.2  --  10.5  HGB 9.3*  < > 9.6* 8.2* 8.7*  HCT 28.0*  < > 29.3* 24.0* 27.0*  PLT 230  --  333  --  434*  < > = values in this interval not displayed.  Coag's  Recent Labs Lab 05/06/17 0338 05/07/17 0401 05/08/17 0450  INR 1.51 1.55 1.80    Sepsis Markers  Recent Labs Lab 05/06/17 0958 05/07/17 0401 05/08/17 0450  PROCALCITON 1.04 0.80 0.50    ABG  Recent Labs Lab 05/07/17 0424 05/07/17 1447 05/08/17 0410  PHART 7.495* 7.511* 7.508*  PCO2ART 44.6 45.8 40.8  PO2ART 60.2* 337.0* 58.8*    Liver Enzymes  Recent Labs Lab 05/02/17 0047 05/04/17 0429  AST 36 22  ALT 20 18  ALKPHOS 37* 49  BILITOT 0.7 0.8  ALBUMIN 3.0* 2.3*    Cardiac Enzymes  Recent Labs Lab 05/01/17 1314 05/01/17 2005  TROPONINI 0.44* 0.43*    Glucose  Recent Labs Lab 05/07/17 1138 05/07/17 1514 05/07/17 2019 05/08/17 0000 05/08/17 0346 05/08/17 0730  GLUCAP 188* 173* 123* 153* 127* 130*    Imaging CXR 6/26> Left lung opacification, extensive SQ emphysema. Small right pneumothorax I have reviewed the images personally  STUDIES:  2D echo 6/20>>> - Left ventricle:  The cavity size was normal. Wall thickness was normal. Systolic function was severely reduced. The estimated ejection fraction was in the range of 10% to 15%. Doppler parameters are consistent with abnormal left ventricular relaxation (grade 1 diastolic dysfunction). - Aortic valve: A mechanical prosthesis was present. - Mitral valve: There was moderate regurgitation. - Left atrium: The atrium was mildly dilated. - Right ventricle: Systolic function was severely reduced. - Right atrium: The atrium was mildly dilated. - Tricuspid valve: There was mild-moderate regurgitation. -  Pericardium, extracardiac: There was a left pleural effusion.  CULTURES: Sputum 6/24 >>> Normal resp flora Bronch washings 6/26 >>  ANTIBIOTICS: Vanc 6/24>>> Zosyn 6/24>>>  SIGNIFICANT EVENTS: 6/21 Extubated, reintubated 6/24 Extubated 6/26 Bronch, reintubated  LINES/TUBES: ETT 6/20>>> 6/21; 6/21 >> 6/24 R chest tube #1 6/20>>> R chest tube #2 6/21 >>   ASSESSMENT / PLAN: 71yo male with VT arrest POD#9 thoracic aortic aneurysm repair.  Suffered VT arrest, recurrent resp failure secondary to secretions, rt pneumothorax and LLL HCAP.  Acute respiratory failure - post VT arrest  HCAP Hx COPD  Right pneumothorax (after CPR) status post chest tube 2 Severe left-sided atelectasis with mucus plugging Plan: Continue vent support.  Trach suctioning CT management per CVTS  Possible HCAP Prelim cultures w/ gpc clusters Plan Continue vanco, zosyn day 4.  Follow bronch cultures.   VT arrest 6/20 - unclear etiology.  Acute on chronic CHF - EF 10-15%; Shock - cardiogenic.   Repair thoracic aortic aneurysm 6/18 Hx AVR   Plan Continue levophed. Goal MAP 65 On amio  Fluid and electrolyte d/o -persistent hyponatremia  Plan Follow labs  Protein calorie malnutrition  Plan Tube feeds  Chronic anticoagulation, mechanical valve Anemia of critical illness.  Plan: Heparin  gtt  Hyperglycemia   Plan:   SSI  ICU delirium Plan:   Precedex for sedation Avoid haldol due to VT arrest, prolonged qTC   FAMILY  - Updates:  Wife and son updated 6/27  DVT prophylaxis: Heparin gtt SUP: PPI Diet: Tube feeds Activity: bedrest  Disposition : icu  The patient is critically ill with multiple organ system failure and requires high complexity decision making for assessment and support, frequent evaluation and titration of therapies, advanced monitoring, review of radiographic studies and interpretation of complex data.   Critical Care Time devoted to patient care services, exclusive of separately billable procedures, described in this note is 35 minutes.   Chilton Greathouse MD [facility] Pager [phone] If no answer or after 3pm call: [phone] 05/08/2017, 10:41 AM

## 2017-05-09 ENCOUNTER — Inpatient Hospital Stay (HOSPITAL_COMMUNITY): Payer: Medicare Other

## 2017-05-09 LAB — POCT I-STAT, CHEM 8
BUN: 20 mg/dL (ref 6–20)
BUN: 27 mg/dL — ABNORMAL HIGH (ref 6–20)
Calcium, Ion: 0.93 mmol/L — ABNORMAL LOW (ref 1.15–1.40)
Calcium, Ion: 1.03 mmol/L — ABNORMAL LOW (ref 1.15–1.40)
Chloride: 94 mmol/L — ABNORMAL LOW (ref 101–111)
Creatinine, Ser: 0.6 mg/dL — ABNORMAL LOW (ref 0.61–1.24)
Creatinine, Ser: 0.9 mg/dL (ref 0.61–1.24)
Glucose, Bld: 168 mg/dL — ABNORMAL HIGH (ref 65–99)
HCT: 22 % — ABNORMAL LOW (ref 39.0–52.0)
HCT: 24 % — ABNORMAL LOW (ref 39.0–52.0)
Hemoglobin: 7.5 g/dL — ABNORMAL LOW (ref 13.0–17.0)
Hemoglobin: 8.2 g/dL — ABNORMAL LOW (ref 13.0–17.0)
Potassium: 3.6 mmol/L (ref 3.5–5.1)
Sodium: 137 mmol/L (ref 135–145)
TCO2: 23 mmol/L (ref 0–100)
TCO2: 30 mmol/L (ref 0–100)

## 2017-05-09 LAB — TYPE AND SCREEN
ABO/RH(D): A POS
Antibody Screen: NEGATIVE
Unit division: 0
Unit division: 0
Unit division: 0
Unit division: 0
Unit division: 0
Unit division: 0

## 2017-05-09 LAB — GLUCOSE, CAPILLARY
Glucose-Capillary: 120 mg/dL — ABNORMAL HIGH (ref 65–99)
Glucose-Capillary: 122 mg/dL — ABNORMAL HIGH (ref 65–99)
Glucose-Capillary: 139 mg/dL — ABNORMAL HIGH (ref 65–99)
Glucose-Capillary: 146 mg/dL — ABNORMAL HIGH (ref 65–99)
Glucose-Capillary: 153 mg/dL — ABNORMAL HIGH (ref 65–99)

## 2017-05-09 LAB — BLOOD GAS, ARTERIAL
Acid-Base Excess: 7.6 mmol/L — ABNORMAL HIGH (ref 0.0–2.0)
Bicarbonate: 31.2 mmol/L — ABNORMAL HIGH (ref 20.0–28.0)
Drawn by: 41977
FIO2: 40
MECHVT: 580 mL
O2 Saturation: 96.2 %
PEEP: 5 cmH2O
Patient temperature: 98.6
RATE: 16 resp/min
pCO2 arterial: 40.9 mmHg (ref 32.0–48.0)
pH, Arterial: 7.494 — ABNORMAL HIGH (ref 7.350–7.450)
pO2, Arterial: 79.1 mmHg — ABNORMAL LOW (ref 83.0–108.0)

## 2017-05-09 LAB — COOXEMETRY PANEL
CARBOXYHEMOGLOBIN: 1.7 % — AB (ref 0.5–1.5)
METHEMOGLOBIN: 0.6 % (ref 0.0–1.5)
O2 SAT: 60.7 %
TOTAL HEMOGLOBIN: 9.2 g/dL — AB (ref 12.0–16.0)

## 2017-05-09 LAB — COMPREHENSIVE METABOLIC PANEL
ALT: 153 U/L — ABNORMAL HIGH (ref 17–63)
AST: 156 U/L — ABNORMAL HIGH (ref 15–41)
Albumin: 1.9 g/dL — ABNORMAL LOW (ref 3.5–5.0)
Alkaline Phosphatase: 115 U/L (ref 38–126)
Anion gap: 13 (ref 5–15)
BUN: 29 mg/dL — ABNORMAL HIGH (ref 6–20)
CO2: 26 mmol/L (ref 22–32)
Calcium: 7.7 mg/dL — ABNORMAL LOW (ref 8.9–10.3)
Chloride: 97 mmol/L — ABNORMAL LOW (ref 101–111)
Creatinine, Ser: 1.05 mg/dL (ref 0.61–1.24)
GFR calc Af Amer: >60 mL/min (ref >60)
GFR calc non Af Amer: >60 mL/min (ref >60)
Glucose, Bld: 141 mg/dL — ABNORMAL HIGH (ref 65–99)
Potassium: 3.8 mmol/L (ref 3.5–5.1)
Sodium: 136 mmol/L (ref 135–145)
Total Bilirubin: 1.3 mg/dL — ABNORMAL HIGH (ref 0.3–1.2)
Total Protein: 5 g/dL — ABNORMAL LOW (ref 6.5–8.1)

## 2017-05-09 LAB — HEPARIN LEVEL (UNFRACTIONATED)
Heparin Unfractionated: 0.11 IU/mL — ABNORMAL LOW (ref 0.30–0.70)
Heparin Unfractionated: 0.16 IU/mL — ABNORMAL LOW (ref 0.30–0.70)
Heparin Unfractionated: 0.16 IU/mL — ABNORMAL LOW (ref 0.30–0.70)

## 2017-05-09 LAB — CBC
HCT: 25 % — ABNORMAL LOW (ref 39.0–52.0)
Hemoglobin: 7.9 g/dL — ABNORMAL LOW (ref 13.0–17.0)
MCH: 29.3 pg (ref 26.0–34.0)
MCHC: 31.6 g/dL (ref 30.0–36.0)
MCV: 92.6 fL (ref 78.0–100.0)
Platelets: 413 10*3/uL — ABNORMAL HIGH (ref 150–400)
RBC: 2.7 MIL/uL — ABNORMAL LOW (ref 4.22–5.81)
RDW: 15.2 % (ref 11.5–15.5)
WBC: 16.3 10*3/uL — ABNORMAL HIGH (ref 4.0–10.5)

## 2017-05-09 LAB — BPAM RBC
Blood Product Expiration Date: 201806272359
Blood Product Expiration Date: 201807012359
Blood Product Expiration Date: 201807012359
Blood Product Expiration Date: 201807022359
Blood Product Expiration Date: 201807022359
Blood Product Expiration Date: 201807022359
ISSUE DATE / TIME: 201806200909
ISSUE DATE / TIME: 201806201010
ISSUE DATE / TIME: 201806241129
ISSUE DATE / TIME: 201806241332
Unit Type and Rh: 6200
Unit Type and Rh: 6200
Unit Type and Rh: 6200
Unit Type and Rh: 6200
Unit Type and Rh: 6200
Unit Type and Rh: 6200

## 2017-05-09 LAB — PROTIME-INR
INR: 1.66
Prothrombin Time: 19.8 seconds — ABNORMAL HIGH (ref 11.4–15.2)

## 2017-05-09 LAB — CULTURE, BAL-QUANTITATIVE: CULTURE: NO GROWTH

## 2017-05-09 LAB — TSH: TSH: 4.065 u[IU]/mL (ref 0.350–4.500)

## 2017-05-09 LAB — CULTURE, BAL-QUANTITATIVE W GRAM STAIN: Special Requests: NORMAL

## 2017-05-09 MED ORDER — POTASSIUM CHLORIDE 10 MEQ/50ML IV SOLN
10.0000 meq | INTRAVENOUS | Status: AC | PRN
  Administered 2017-05-09 (×3): 10 meq via INTRAVENOUS
  Filled 2017-05-09: qty 50

## 2017-05-09 MED ORDER — AMIODARONE HCL 200 MG PO TABS
200.0000 mg | ORAL_TABLET | Freq: Two times a day (BID) | ORAL | Status: DC
  Administered 2017-05-09 – 2017-05-15 (×14): 200 mg via ORAL
  Filled 2017-05-09 (×14): qty 1

## 2017-05-09 MED ORDER — HEPARIN (PORCINE) IN NACL 100-0.45 UNIT/ML-% IJ SOLN
2000.0000 [IU]/h | INTRAMUSCULAR | Status: DC
  Administered 2017-05-10 (×2): 1850 [IU]/h via INTRAVENOUS
  Administered 2017-05-11: 2000 [IU]/h via INTRAVENOUS
  Administered 2017-05-12 – 2017-05-14 (×5): 2050 [IU]/h via INTRAVENOUS
  Administered 2017-05-16 – 2017-05-17 (×3): 2000 [IU]/h via INTRAVENOUS
  Filled 2017-05-09 (×14): qty 250

## 2017-05-09 NOTE — Progress Notes (Signed)
ANTICOAGULATION CONSULT NOTE Pharmacy Consult for Heparin Indication: mechanical AVR  Allergies  Allergen Reactions  . No Known Allergies     Patient Measurements: Height: 5\' 10"  (177.8 cm) Weight: 150 lb 9.2 oz (68.3 kg) IBW/kg (Calculated) : 73   Vital Signs: Temp: 98.4 F (36.9 C) (06/28 1100) Temp Source: Oral (06/28 1100) BP: 104/50 (06/28 1146) Pulse Rate: 90 (06/28 1146)  Labs:  Recent Labs  05/07/17 0401  05/08/17 0450  05/08/17 1528 05/08/17 1605 05/08/17 2017 05/09/17 0415 05/09/17 0655 05/09/17 1251  HGB 9.6*  < > 8.7*  --  7.8* 8.2*  --   --  7.9*  --   HCT 29.3*  < > 27.0*  --  23.0* 24.0*  --   --  25.0*  --   PLT 333  --  434*  --   --   --   --   --  413*  --   LABPROT 18.7*  --  21.1*  --   --   --   --  19.8*  --   --   INR 1.55  --  1.80  --   --   --   --  1.66  --   --   HEPARINUNFRC  --   < >  --   < >  --   --  0.13* 0.11*  --  0.16*  CREATININE 0.98  < > 1.01  --  1.00 1.10  --   --  1.05  --   < > = values in this interval not displayed.  Estimated Creatinine Clearance: 61.4 mL/min (by C-G formula based on SCr of 1.05 mg/dL).  Assessment: 71 year old male with h/o CHF and mechanical AVR s/p ascending aorta replacement 04/29/17 for heparin  Heparin level remains low on heparin infusion at 1550 units/hr. No issues with bleeding. CBC stable from 6/27.  No trouble with IV per RN and family.  Goal of Therapy:  Heparin level = 0.3 - 0.5 units / ml Monitor platelets by anticoagulation protocol: Yes   Plan:  1. Increase Heparin infusion to 1700 units/hr 2. Check heparin level in 8 hours.  3. Daily heparin level and CBC.  Tad Moore, BCPS  Clinical Pharmacist Pager [phone]  05/09/2017 1:55 PM

## 2017-05-09 NOTE — Progress Notes (Signed)
ANTICOAGULATION CONSULT NOTE Pharmacy Consult for Heparin Indication: mechanical AVR  Allergies  Allergen Reactions  . No Known Allergies     Patient Measurements: Height: 5\' 10"  (177.8 cm) Weight: 150 lb 9.2 oz (68.3 kg) IBW/kg (Calculated) : 73   Vital Signs: Temp: 99.1 F (37.3 C) (06/28 0354) Temp Source: Oral (06/28 0354) BP: 89/47 (06/28 0530) Pulse Rate: 44 (06/28 0530)  Labs:  Recent Labs  05/07/17 0401  05/08/17 0450 05/08/17 1013 05/08/17 1528 05/08/17 1605 05/08/17 2017 05/09/17 0415  HGB 9.6*  < > 8.7*  --  7.8* 8.2*  --   --   HCT 29.3*  < > 27.0*  --  23.0* 24.0*  --   --   PLT 333  --  434*  --   --   --   --   --   LABPROT 18.7*  --  21.1*  --   --   --   --  19.8*  INR 1.55  --  1.80  --   --   --   --  1.66  HEPARINUNFRC  --   < >  --  <0.10*  --   --  0.13* 0.11*  CREATININE 0.98  < > 1.01  --  1.00 1.10  --   --   < > = values in this interval not displayed.  Estimated Creatinine Clearance: 58.6 mL/min (by C-G formula based on SCr of 1.1 mg/dL).  Assessment: 71 year old male with h/o CHF and mechanical AVR s/p ascending aorta replacement 04/29/17 for heparin  Heparin level remains low on heparin infusion at 1300 units/hr. No issues with bleeding. CBC stable from 6/27.    Goal of Therapy:  Heparin level = 0.3 - 0.5 units / ml Monitor platelets by anticoagulation protocol: Yes   Plan:  1. Increase Heparin infusion to 1550 units/hr 2. Check heparin level in 8 hours.   Pollyann Samples, PharmD, BCPS 05/09/2017, 5:46 AM

## 2017-05-09 NOTE — Progress Notes (Signed)
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  Underwent ascending aortic replacement/wheat procedure with CABG.  Was progressing well post op and ambulating in hall.  However just after midnight 6/20 had VT arrest with CPR and shock x 3.  Intubated during ACLS, small R ptx noted post CPR and CT placed by CVTS.  PCCM consulted for vent management.   SUBJECTIVE:  Stable on vent SQ air is decreasing On PSV weans today AM  PAST MEDICAL HISTORY :  He  has a past medical history of AVD (aortic valve disease); Chronic anticoagulation; Dilated cardiomyopathy (HCC); Dysrhythmia; Hyperlipidemia; and LV dysfunction.  PAST SURGICAL HISTORY: He  has a past surgical history that includes Aortic valve replacement; doppler echocardiography (04/07/2002); Right/Left Heart Cath and Coronary Angiography (N/A, 03/05/2017); Bentall procedure (N/A, 04/29/2017); and TEE without cardioversion (N/A, 04/29/2017).  Allergies  Allergen Reactions  . No Known Allergies     No current facility-administered medications on file prior to encounter.    Current Outpatient Prescriptions on File Prior to Encounter  Medication Sig  . carvedilol (COREG) 3.125 MG tablet Take 2 tablets (6.25 mg total) by mouth 2 (two) times daily.  . colchicine 0.6 MG tablet Take 0.6 mg by mouth daily as needed.  Marland Kitchen losartan (COZAAR) 25 MG tablet Take 1 tablet (25 mg total) by mouth daily.  . methylcellulose (CITRUCEL) oral powder Take 1 packet by mouth 2 (two) times daily.   . sildenafil (REVATIO) 20 MG  tablet TAKE 1-5 TABLETS BY MOUTH AS NEEDED FOR SEXUAL ACTIVITY (Patient not taking: Reported on 04/25/2017)  . warfarin (COUMADIN) 5 MG tablet Take 1 tablet by mouth daily or as directed by [facility] (Patient not taking: Reported on 04/25/2017)    FAMILY HISTORY:  His indicated that his mother is alive. He indicated that his father is deceased. He indicated that his brother is deceased. He indicated that his maternal grandmother is deceased. He indicated that his maternal grandfather is deceased. He indicated that his paternal grandmother is deceased. He indicated that his paternal grandfather is deceased.    SOCIAL HISTORY: He  reports that he quit smoking about 16 years ago. He has never used smokeless tobacco. He reports that he does not drink alcohol or use drugs.  REVIEW OF SYSTEMS:   Unable to obtain as pt is intubated   VITAL SIGNS: BP (!) 91/42   Pulse (!) 44   Temp (!) 100.4 F (38 C) (Oral)   Resp (!) 26   Ht 5\' 10"  (1.778 m)   Wt 150 lb 9.2 oz (68.3 kg)   SpO2 100%   BMI 21.61 kg/m   HEMODYNAMICS: CVP:  [3 mmHg-9 mmHg] 9 mmHg  VENTILATOR SETTINGS: Vent Mode: PSV;CPAP FiO2 (%):  [40 %] 40 % Set Rate:  [16 bmp] 16 bmp Vt Set:  [580 mL] 580 mL PEEP:  [5 cmH20] 5 cmH20 Pressure Support:  [10 cmH20] 10 cmH20 Plateau Pressure:  [16 cmH20-20 cmH20] 17 cmH20  INTAKE / OUTPUT: I/O last 3 completed shifts: In: 4662.5 [I.V.:1602.5; NG/GT:2260; IV Piggyback:800] Out: 2845 [  EBRAX:0940; Chest Tube:190]  PHYSICAL EXAMINATION: Gen:      No acute distress HEENT:  EOMI, sclera anicteric, ETT in place Neck:     No masses; no thyromegaly Lungs:    Coarse thonchi; normal respiratory effort CV:         Regular rate and rhythm; no murmurs Abd:      + bowel sounds; soft, non-tender; no palpable masses, no distension Ext:    No edema; adequate peripheral perfusion Skin:      Warm and dry; no rash Neuro: Awake, follows commands.  LABS:  BMET  Recent Labs Lab  05/07/17 0401  05/08/17 0450 05/08/17 1528 05/08/17 1605 05/09/17 0655  NA 134*  < > 134* 131* 136 136  K 3.6  < > 4.1 7.1* 3.6 3.8  CL 93*  < > 96* 91* 92* 97*  CO2 33*  --  31  --   --  26  BUN 27*  < > 28* 42* 29* 29*  CREATININE 0.98  < > 1.01 1.00 1.10 1.05  GLUCOSE 156*  < > 158* 275* 177* 141*  < > = values in this interval not displayed.  Electrolytes  Recent Labs Lab 05/03/17 1845 05/04/17 0429 05/05/17 0337 05/06/17 0338 05/07/17 0401 05/08/17 0450 05/09/17 0655  CALCIUM  --  7.5* 7.7* 7.8* 8.1* 7.8* 7.7*  MG 2.3 2.0 1.9 1.7  --  1.9  --   PHOS 2.9 2.7  --   --   --   --   --     CBC  Recent Labs Lab 05/07/17 0401  05/08/17 0450 05/08/17 1528 05/08/17 1605 05/09/17 0655  WBC 9.2  --  10.5  --   --  16.3*  HGB 9.6*  < > 8.7* 7.8* 8.2* 7.9*  HCT 29.3*  < > 27.0* 23.0* 24.0* 25.0*  PLT 333  --  434*  --   --  413*  < > = values in this interval not displayed.  Coag's  Recent Labs Lab 05/07/17 0401 05/08/17 0450 05/09/17 0415  INR 1.55 1.80 1.66    Sepsis Markers  Recent Labs Lab 05/06/17 0958 05/07/17 0401 05/08/17 0450  PROCALCITON 1.04 0.80 0.50    ABG  Recent Labs Lab 05/07/17 1447 05/08/17 0410 05/09/17 0330  PHART 7.511* 7.508* 7.494*  PCO2ART 45.8 40.8 40.9  PO2ART 337.0* 58.8* 79.1*    Liver Enzymes  Recent Labs Lab 05/04/17 0429 05/09/17 0655  AST 22 156*  ALT 18 153*  ALKPHOS 49 115  BILITOT 0.8 1.3*  ALBUMIN 2.3* 1.9*    Cardiac Enzymes No results for input(s): TROPONINI, PROBNP in the last 168 hours.  Glucose  Recent Labs Lab 05/08/17 1151 05/08/17 1518 05/08/17 1918 05/08/17 2346 05/09/17 0332 05/09/17 0736  GLUCAP 151* 150* 128* 148* 122* 146*    Imaging CXR 6/26> Left lung opacification, extensive SQ emphysema. Small right pneumothorax I have reviewed the images personally  STUDIES:  2D echo 6/20>>> - Left ventricle: The cavity size was normal. Wall thickness was normal. Systolic  function was severely reduced. The estimated ejection fraction was in the range of 10% to 15%. Doppler parameters are consistent with abnormal left ventricular relaxation (grade 1 diastolic dysfunction). - Aortic valve: A mechanical prosthesis was present. - Mitral valve: There was moderate regurgitation. - Left atrium: The atrium was mildly dilated. - Right ventricle: Systolic function was severely reduced. - Right atrium: The atrium was mildly dilated. - Tricuspid valve: There was mild-moderate regurgitation. - Pericardium, extracardiac:  There was a left pleural effusion.  CULTURES: Sputum 6/24 >>> Normal resp flora Bronch washings 6/26 >> no growth  ANTIBIOTICS: Vanc 6/24>>> Zosyn 6/24>>>  SIGNIFICANT EVENTS: 6/21 Extubated, reintubated 6/24 Extubated 6/26 Bronch, reintubated  LINES/TUBES: ETT 6/20>>> 6/21; 6/21 >> 6/24; 6/26>> R chest tube #1 6/20>>> R chest tube #2 6/21 >>   ASSESSMENT / PLAN: 71yo male with VT arrest POD#9 thoracic aortic aneurysm repair.  Suffered VT arrest, recurrent resp failure secondary to secretions, rt pneumothorax and LLL HCAP.  Acute respiratory failure - post VT arrest  HCAP Hx COPD  Right pneumothorax (after CPR) status post chest tube 2 Severe left-sided atelectasis with mucus plugging Plan: Continue vent support PSV weans as tolerated Follow CXR  Possible HCAP Elevated Pct Plan Continue vanco, zosyn day 5  VT arrest 6/20 - unclear etiology.  Acute on chronic CHF - EF 10-15%; Shock - cardiogenic.   Repair thoracic aortic aneurysm 6/18 Hx AVR   Plan Continue levophed. Goal MAP 65 Heparin anticoagulation   Protein calorie malnutrition  Plan Tube feeds  Hyperglycemia   Plan:   SSI  ICU delirium Plan:   Precedex for sedation Avoid haldol due to VT arrest, prolonged qTC  FAMILY  - Updates:  Wife and son updated 6/28  DVT prophylaxis: Heparin gtt SUP: PPI Diet: Tube feeds Activity: bedrest   Disposition : icu  The patient is critically ill with multiple organ system failure and requires high complexity decision making for assessment and support, frequent evaluation and titration of therapies, advanced monitoring, review of radiographic studies and interpretation of complex data.   Critical Care Time devoted to patient care services, exclusive of separately billable procedures, described in this note is 35 minutes.   Chilton Greathouse MD [facility] Pager [phone] If no answer or after 3pm call: [phone] 05/09/2017, 10:01 AM

## 2017-05-09 NOTE — Progress Notes (Signed)
Patient ID: Jack Joseph, male   DOB: Jul 15, 1946, 71 y.o.   MRN: [id number] TCTS DAILY ICU PROGRESS NOTE                   [address]            [place] [address]          [phone]   10 Days Post-Op Procedure(s) (LRB): REDO STERNOTOMY (N/A) REPLACEMENT ASCENDING AORTA/ WHEAT PROCEDURE, HYPOTHERMIC CIRCULATORY ARREST AND CARDIOPULMONARY BYPASS, AND USE OF HEMASHIELD PLATINUM WOVEN DOUBLE VELOUR VASCULAR GRAFT 32 MM X 50 CM (N/A) TRANSESOPHAGEAL ECHOCARDIOGRAM (TEE) (N/A)  Total Length of Stay:  LOS: 10 days   Subjective: On vent, wakes up follows command,   Objective: Vital signs in last 24 hours: Temp:  [99 F (37.2 C)-101.2 F (38.4 C)] 100.4 F (38 C) (06/28 0730) Pulse Rate:  [37-90] 89 (06/28 0730) Cardiac Rhythm: Normal sinus rhythm (06/28 0400) Resp:  [17-26] 19 (06/28 0730) BP: (67-121)/(37-68) 96/49 (06/28 0730) SpO2:  [100 %] 100 % (06/28 0730) FiO2 (%):  [40 %] 40 % (06/28 0400) Weight:  [150 lb 9.2 oz (68.3 kg)] 150 lb 9.2 oz (68.3 kg) (06/28 0500)  Filed Weights   05/07/17 0440 05/08/17 0500 05/09/17 0500  Weight: 141 lb 8.6 oz (64.2 kg) 147 lb 0.8 oz (66.7 kg) 150 lb 9.2 oz (68.3 kg)    Weight change: 3 lb 8.4 oz (1.6 kg)   Hemodynamic parameters for last 24 hours: CVP:  [3 mmHg-4 mmHg] 3 mmHg  Intake/Output from previous day: 06/27 0701 - 06/28 0700 In: 3378 [I.V.:1153; SW/FU:9323; IV Piggyback:750] Out: 2255 [Urine:2195; Chest Tube:60]  Intake/Output this shift: No intake/output data recorded.  Current Meds: Scheduled Meds: . amiodarone  200 mg Oral BID  . aspirin EC  81 mg Oral Daily   Or  . aspirin  81 mg Per Tube Daily  . bisacodyl  10 mg Oral Daily   Or  . bisacodyl  10 mg Rectal Daily  . chlorhexidine gluconate (MEDLINE KIT)  15 mL Mouth Rinse BID  . Chlorhexidine Gluconate Cloth  6 each Topical Daily  . docusate  200 mg Oral Daily  . feeding supplement (PRO-STAT SUGAR FREE 64)  30 mL Per Tube BID  .  furosemide  40 mg Intravenous Once  . insulin aspart  0-24 Units Subcutaneous Q4H  . levalbuterol  0.63 mg Nebulization TID  . magic mouthwash  5 mL Oral TID  . mouth rinse  15 mL Mouth Rinse 10 times per day  . pantoprazole sodium  40 mg Per Tube Daily  . sodium chloride flush  10-40 mL Intracatheter Q12H  . sodium chloride flush  3 mL Intravenous Q12H  . sodium chloride HYPERTONIC  4 mL Nebulization BID  . Warfarin - Physician Dosing Inpatient   Does not apply q1800   Continuous Infusions: . sodium chloride 20 mL/hr at 05/08/17 1918  . amiodarone Stopped (05/07/17 1100)  . dexmedetomidine (PRECEDEX) IV infusion 1 mcg/kg/hr (05/09/17 0509)  . DOPamine Stopped (05/07/17 0100)  . feeding supplement (VITAL 1.5 CAL) 1,000 mL (05/09/17 0542)  . heparin 1,550 Units/hr (05/09/17 0538)  . norepinephrine (LEVOPHED) Adult infusion 5 mcg/min (05/09/17 5573)  . piperacillin-tazobactam (ZOSYN)  IV 3.375 g (05/09/17 2202)  . potassium chloride    . vancomycin Stopped (05/09/17 0103)   PRN Meds:.acetaminophen (TYLENOL) oral liquid 160 mg/5 mL, fentaNYL (SUBLIMAZE) injection, fentaNYL (SUBLIMAZE) injection, guaiFENesin, levalbuterol, ondansetron (ZOFRAN) IV, potassium chloride, sodium chloride  flush, sodium chloride flush, sorbitol  General appearance: cooperative and no distress Neurologic: intact Heart: regular rate and rhythm, S1, S2 normal, no murmur, click of valve present  , rub or gallop Lungs: diminished breath sounds bilaterally Abdomen: soft, non-tender; bowel sounds normal; no masses,  no organomegaly Extremities: extremities normal, atraumatic, no cyanosis or edema and Homans sign is negative, no sign of DVT Wound: wound inatct  Lab Results: CBC: Recent Labs  05/08/17 0450  05/08/17 1605 05/09/17 0655  WBC 10.5  --   --  16.3*  HGB 8.7*  < > 8.2* 7.9*  HCT 27.0*  < > 24.0* 25.0*  PLT 434*  --   --  413*  < > = values in this interval not displayed. BMET:  Recent Labs   05/08/17 0450  05/08/17 1605 05/09/17 0655  NA 134*  < > 136 136  K 4.1  < > 3.6 3.8  CL 96*  < > 92* 97*  CO2 31  --   --  26  GLUCOSE 158*  < > 177* 141*  BUN 28*  < > 29* 29*  CREATININE 1.01  < > 1.10 1.05  CALCIUM 7.8*  --   --  7.7*  < > = values in this interval not displayed.  CMET: Lab Results  Component Value Date   WBC 16.3 (H) 05/09/2017   HGB 7.9 (L) 05/09/2017   HCT 25.0 (L) 05/09/2017   PLT 413 (H) 05/09/2017   GLUCOSE 141 (H) 05/09/2017   CHOL 199 01/22/2017   TRIG 71 01/22/2017   HDL 64 01/22/2017   LDLCALC 121 (H) 01/22/2017   ALT 153 (H) 05/09/2017   AST 156 (H) 05/09/2017   NA 136 05/09/2017   K 3.8 05/09/2017   CL 97 (L) 05/09/2017   CREATININE 1.05 05/09/2017   BUN 29 (H) 05/09/2017   CO2 26 05/09/2017   TSH 5.390 (H) 01/22/2017   INR 1.66 05/09/2017   HGBA1C 5.7 (H) 04/25/2017      PT/INR:  Recent Labs  05/09/17 0415  LABPROT 19.8*  INR 1.66   Radiology: Dg Chest Port 1 View  Result Date: 05/09/2017 CLINICAL DATA:  Chest tube. EXAM: PORTABLE CHEST 1 VIEW COMPARISON:  05/08/2017. FINDINGS: Endotracheal tube, feeding tube, right chest tubes, right PICC line stable position. Tiny residual right apical pneumothorax again noted. Heart size stable. Prior cardiac valve replacement. Left base atelectasis/consolidation with slight improvement. Small left pleural effusion. Multiple right rib fractures again noted. Diffuse chest wall subcutaneous emphysema again noted. IMPRESSION: 1. Lines and tubes including 2 right chest tubes in stable position. Tiny residual right apical pneumothorax again noted. Right chest wall subcutaneous emphysema again noted. Multiple right rib fractures again noted. 2. Left base atelectasis/ consolidation with slight improvement again noted. Small left pleural effusion again noted. 3. Prior cardiac valve replacement.  Stable cardiomegaly. Electronically Signed   By: Maisie Fus  Register   On: 05/09/2017 06:02   Dg Abd Portable  1v  Result Date: 05/09/2017 CLINICAL DATA:  Feeding tube placement EXAM: PORTABLE ABDOMEN - 1 VIEW COMPARISON:  05/07/2017 FINDINGS: The enteric tube extends into the second portion of the duodenum. Visible portions of the abdomen exhibit a normal gas pattern. IMPRESSION: Feeding tube extends into the duodenal second portion. Electronically Signed   By: Ellery Plunk M.D.   On: 05/09/2017 05:05     Assessment/Plan: S/P Procedure(s) (LRB): REDO STERNOTOMY (N/A) REPLACEMENT ASCENDING AORTA/ WHEAT PROCEDURE, HYPOTHERMIC CIRCULATORY ARREST AND CARDIOPULMONARY BYPASS, AND USE OF HEMASHIELD  PLATINUM WOVEN DOUBLE VELOUR VASCULAR GRAFT 32 MM X 50 CM (N/A) TRANSESOPHAGEAL ECHOCARDIOGRAM (TEE) (N/A) Continue nutritional support and vent  On heparin for mechanical valve  Monitor iv cordrone with rising lfts Decreasing sub q air  renal  function stable    [name] 05/09/2017 7:49 AM

## 2017-05-09 NOTE — Progress Notes (Signed)
SLP Cancellation Note  Patient Details Name: Jamaree Scalia MRN: [id number] DOB: 1946/02/10   Cancelled treatment:       Reason Eval/Treat Not Completed: Medical issues which prohibited therapy. Patient remains on vent. Signing off. Please re consult when extubated.   Ferdinand Lango MA, CCC-SLP [phone]    Ferdinand Lango Meryl 05/09/2017, 7:37 AM

## 2017-05-09 NOTE — Progress Notes (Signed)
ANTICOAGULATION CONSULT NOTE Pharmacy Consult for Heparin Indication: mechanical AVR  Allergies  Allergen Reactions  . No Known Allergies     Patient Measurements: Height: 5\' 10"  (177.8 cm) Weight: 150 lb 9.2 oz (68.3 kg) IBW/kg (Calculated) : 73   Vital Signs: Temp: 99.4 F (37.4 C) (06/28 1935) Temp Source: Oral (06/28 1935) BP: 94/49 (06/28 2145) Pulse Rate: 89 (06/28 2145)  Labs:  Recent Labs  05/07/17 0401  05/08/17 0450  05/08/17 1605  05/09/17 0415 05/09/17 0655 05/09/17 1251 05/09/17 1514 05/09/17 2100  HGB 9.6*  < > 8.7*  < > 8.2*  --   --  7.9*  --  8.2*  --   HCT 29.3*  < > 27.0*  < > 24.0*  --   --  25.0*  --  24.0*  --   PLT 333  --  434*  --   --   --   --  413*  --   --   --   LABPROT 18.7*  --  21.1*  --   --   --  19.8*  --   --   --   --   INR 1.55  --  1.80  --   --   --  1.66  --   --   --   --   HEPARINUNFRC  --   < >  --   < >  --   < > 0.11*  --  0.16*  --  0.16*  CREATININE 0.98  < > 1.01  < > 1.10  --   --  1.05  --  0.90  --   < > = values in this interval not displayed.  Estimated Creatinine Clearance: 71.7 mL/min (by C-G formula based on SCr of 0.9 mg/dL).  Assessment: 71 year old male with h/o CHF and mechanical AVR s/p ascending aorta replacement 04/29/17 for heparin  Heparin level remains low on heparin infusion at 1700 units/hr. No issues with bleeding and no issues with IV.  Goal of Therapy:  Heparin level = 0.3 - 0.5 units / ml Monitor platelets by anticoagulation protocol: Yes   Plan:  Increase heparin to 1850 units/hr (~2 uts/kg/hr) F/U AM labs   Alvester Morin., PharmD Clinical Pharmacist [facility]

## 2017-05-09 NOTE — Progress Notes (Signed)
TCTS BRIEF SICU PROGRESS NOTE  10 Days Post-Op  S/P Procedure(s) (LRB): REDO STERNOTOMY (N/A) REPLACEMENT ASCENDING AORTA/ WHEAT PROCEDURE, HYPOTHERMIC CIRCULATORY ARREST AND CARDIOPULMONARY BYPASS, AND USE OF HEMASHIELD PLATINUM WOVEN DOUBLE VELOUR VASCULAR GRAFT 32 MM X 50 CM (N/A) TRANSESOPHAGEAL ECHOCARDIOGRAM (TEE) (N/A)   Stable day  Plan: Continue current plan  Purcell Nails, MD 05/09/2017 5:48 PM

## 2017-05-10 ENCOUNTER — Inpatient Hospital Stay (HOSPITAL_COMMUNITY): Payer: Medicare Other

## 2017-05-10 LAB — PROTIME-INR
INR: 1.56
Prothrombin Time: 18.9 seconds — ABNORMAL HIGH (ref 11.4–15.2)

## 2017-05-10 LAB — POCT I-STAT, CHEM 8
BUN: 25 mg/dL — ABNORMAL HIGH (ref 6–20)
Calcium, Ion: 1.11 mmol/L — ABNORMAL LOW (ref 1.15–1.40)
Chloride: 99 mmol/L — ABNORMAL LOW (ref 101–111)
Creatinine, Ser: 0.7 mg/dL (ref 0.61–1.24)
Glucose, Bld: 143 mg/dL — ABNORMAL HIGH (ref 65–99)
HCT: 23 % — ABNORMAL LOW (ref 39.0–52.0)
Hemoglobin: 7.8 g/dL — ABNORMAL LOW (ref 13.0–17.0)
Potassium: 3.9 mmol/L (ref 3.5–5.1)
Sodium: 139 mmol/L (ref 135–145)
TCO2: 27 mmol/L (ref 0–100)

## 2017-05-10 LAB — GLUCOSE, CAPILLARY
Glucose-Capillary: 151 mg/dL — ABNORMAL HIGH (ref 65–99)
Glucose-Capillary: 140 mg/dL — ABNORMAL HIGH (ref 65–99)
Glucose-Capillary: 115 mg/dL — ABNORMAL HIGH (ref 65–99)
Glucose-Capillary: 129 mg/dL — ABNORMAL HIGH (ref 65–99)
Glucose-Capillary: 122 mg/dL — ABNORMAL HIGH (ref 65–99)

## 2017-05-10 LAB — CBC
HCT: 23.4 % — ABNORMAL LOW (ref 39.0–52.0)
Hemoglobin: 7.4 g/dL — ABNORMAL LOW (ref 13.0–17.0)
MCH: 29.8 pg (ref 26.0–34.0)
MCHC: 31.6 g/dL (ref 30.0–36.0)
MCV: 94.4 fL (ref 78.0–100.0)
Platelets: 467 10*3/uL — ABNORMAL HIGH (ref 150–400)
RBC: 2.48 MIL/uL — ABNORMAL LOW (ref 4.22–5.81)
RDW: 15.3 % (ref 11.5–15.5)
WBC: 17.7 10*3/uL — ABNORMAL HIGH (ref 4.0–10.5)

## 2017-05-10 LAB — BASIC METABOLIC PANEL
Anion gap: 8 (ref 5–15)
BUN: 23 mg/dL — ABNORMAL HIGH (ref 6–20)
CO2: 29 mmol/L (ref 22–32)
Calcium: 7.5 mg/dL — ABNORMAL LOW (ref 8.9–10.3)
Chloride: 100 mmol/L — ABNORMAL LOW (ref 101–111)
Creatinine, Ser: 0.94 mg/dL (ref 0.61–1.24)
GFR calc Af Amer: >60 mL/min (ref >60)
GFR calc non Af Amer: >60 mL/min (ref >60)
Glucose, Bld: 183 mg/dL — ABNORMAL HIGH (ref 65–99)
Potassium: 3.3 mmol/L — ABNORMAL LOW (ref 3.5–5.1)
Sodium: 137 mmol/L (ref 135–145)

## 2017-05-10 LAB — HEPARIN LEVEL (UNFRACTIONATED)
Heparin Unfractionated: 0.3 IU/mL (ref 0.30–0.70)
Heparin Unfractionated: 0.34 IU/mL (ref 0.30–0.70)

## 2017-05-10 LAB — BLOOD GAS, ARTERIAL
Acid-Base Excess: 5.9 mmol/L — ABNORMAL HIGH (ref 0.0–2.0)
Bicarbonate: 29.7 mmol/L — ABNORMAL HIGH (ref 20.0–28.0)
Drawn by: 41977
FIO2: 40
MECHVT: 580 mL
O2 Saturation: 97.6 %
PEEP: 5 cmH2O
Patient temperature: 98.6
RATE: 16 resp/min
pCO2 arterial: 41.3 mmHg (ref 32.0–48.0)
pH, Arterial: 7.471 — ABNORMAL HIGH (ref 7.350–7.450)
pO2, Arterial: 97.6 mmHg (ref 83.0–108.0)

## 2017-05-10 LAB — PROCALCITONIN: Procalcitonin: 0.32 ng/mL

## 2017-05-10 MED ORDER — POTASSIUM CHLORIDE 10 MEQ/50ML IV SOLN: 10.0000 meq | INTRAVENOUS | Status: DC | PRN

## 2017-05-10 MED ORDER — POTASSIUM CHLORIDE 10 MEQ/50ML IV SOLN
10.0000 meq | INTRAVENOUS | Status: AC
  Administered 2017-05-10 (×3): 10 meq via INTRAVENOUS
  Filled 2017-05-10 (×3): qty 50

## 2017-05-10 NOTE — Progress Notes (Signed)
ANTICOAGULATION CONSULT NOTE Pharmacy Consult for Heparin Indication: mechanical AVR  Allergies  Allergen Reactions  . No Known Allergies     Patient Measurements: Height: 5\' 10"  (177.8 cm) Weight: 150 lb 2.1 oz (68.1 kg) IBW/kg (Calculated) : 73   Vital Signs: Temp: 98.5 F (36.9 C) (06/29 1114) Temp Source: Oral (06/29 1114) BP: 90/56 (06/29 1300) Pulse Rate: 89 (06/29 1300)  Labs:  Recent Labs  05/08/17 0450  05/09/17 0415 05/09/17 0655  05/09/17 1514 05/09/17 2100 05/10/17 0328 05/10/17 1158  HGB 8.7*  < >  --  7.9*  --  8.2*  --  7.4*  --   HCT 27.0*  < >  --  25.0*  --  24.0*  --  23.4*  --   PLT 434*  --   --  413*  --   --   --  467*  --   LABPROT 21.1*  --  19.8*  --   --   --   --  18.9*  --   INR 1.80  --  1.66  --   --   --   --  1.56  --   HEPARINUNFRC  --   < > 0.11*  --   < >  --  0.16* 0.30 0.34  CREATININE 1.01  < >  --  1.05  --  0.90  --  0.94  --   < > = values in this interval not displayed.  Estimated Creatinine Clearance: 68.4 mL/min (by C-G formula based on SCr of 0.94 mg/dL).  . sodium chloride 20 mL/hr at 05/10/17 0800  . dexmedetomidine (PRECEDEX) IV infusion 0.4 mcg/kg/hr (05/10/17 1300)  . feeding supplement (VITAL 1.5 CAL) 1,000 mL (05/10/17 0800)  . heparin 1,850 Units/hr (05/10/17 0800)  . norepinephrine (LEVOPHED) Adult infusion 5 mcg/min (05/10/17 1300)  . piperacillin-tazobactam (ZOSYN)  IV Stopped (05/10/17 2683)     Assessment: 71 year old male with h/o CHF and mechanical AVR s/p ascending aorta replacement 04/29/17 for heparin  Heparin level at goal.  No bleeding or complications noted.  Goal of Therapy:  Heparin level = 0.3 - 0.5 units / ml Monitor platelets by anticoagulation protocol: Yes   Plan:  Continue IV heparin at current rate. Daily heparin level and CBC. Consider resuming Coumadin soon? (confirmed with RN patient has Cor-trak)  Tad Moore, BCPS  Clinical Pharmacist Pager [phone]  05/10/2017 1:18 PM

## 2017-05-10 NOTE — Progress Notes (Signed)
ANTICOAGULATION CONSULT NOTE - Follow Up Consult  Pharmacy Consult for heparin Indication: AVR  Labs:  Recent Labs  05/08/17 0450  05/09/17 0415 05/09/17 0655 05/09/17 1251 05/09/17 1514 05/09/17 2100 05/10/17 0328  HGB 8.7*  < >  --  7.9*  --  8.2*  --  7.4*  HCT 27.0*  < >  --  25.0*  --  24.0*  --  23.4*  PLT 434*  --   --  413*  --   --   --  467*  LABPROT 21.1*  --  19.8*  --   --   --   --  18.9*  INR 1.80  --  1.66  --   --   --   --  1.56  HEPARINUNFRC  --   < > 0.11*  --  0.16*  --  0.16* 0.30  CREATININE 1.01  < >  --  1.05  --  0.90  --  0.94  < > = values in this interval not displayed.   Assessment/Plan:  71yo male therapeutic on heparin after rate changes. Will continue gtt at current rate and confirm stable with additional level.   Vernard Gambles, PharmD, BCPS  05/10/2017,5:25 AM

## 2017-05-10 NOTE — Progress Notes (Signed)
Patient ID: Jack Joseph, male   DOB: [birth year], 71 y.o.   MRN: [id number] TCTS DAILY ICU PROGRESS NOTE                   [address]            [place] [id number]          [phone]   11 Days Post-Op Procedure(s) (LRB): REDO STERNOTOMY (N/A) REPLACEMENT ASCENDING AORTA/ WHEAT PROCEDURE, HYPOTHERMIC CIRCULATORY ARREST AND CARDIOPULMONARY BYPASS, AND USE OF HEMASHIELD PLATINUM WOVEN DOUBLE VELOUR VASCULAR GRAFT 32 MM X 50 CM (N/A) TRANSESOPHAGEAL ECHOCARDIOGRAM (TEE) (N/A)  Total Length of Stay:  LOS: 11 days   Subjective: remains on vent neuro intact   Objective: Vital signs in last 24 hours: Temp:  [97.4 F (36.3 C)-99.4 F (37.4 C)] 97.4 F (36.3 C) (06/29 1600) Pulse Rate:  [35-102] 88 (06/29 1630) Cardiac Rhythm: Atrial paced (06/29 0800) Resp:  [15-32] 17 (06/29 1630) BP: (79-147)/(33-117) 90/56 (06/29 1630) SpO2:  [97 %-100 %] 100 % (06/29 1630) FiO2 (%):  [40 %] 40 % (06/29 1615) Weight:  [150 lb 2.1 oz (68.1 kg)] 150 lb 2.1 oz (68.1 kg) (06/29 0423)  Filed Weights   05/08/17 0500 05/09/17 0500 05/10/17 0423  Weight: 147 lb 0.8 oz (66.7 kg) 150 lb 9.2 oz (68.3 kg) 150 lb 2.1 oz (68.1 kg)    Weight change: -7.1 oz (-0.2 kg)   Hemodynamic parameters for last 24 hours: CVP:  [6 mmHg-26 mmHg] 9 mmHg  Intake/Output from previous day: 06/28 0701 - 06/29 0700 In: 3572.4 [I.V.:1142.4; NG/GT:1880; IV Piggyback:550] Out: 6834 [Urine:1600; Chest Tube:90]  Intake/Output this shift: Total I/O In: 1045.5 [I.V.:395.5; NG/GT:550; IV Piggyback:100] Out: 425 [Urine:395; Chest Tube:30]  Current Meds: Scheduled Meds: . amiodarone  200 mg Oral BID  . aspirin EC  81 mg Oral Daily   Or  . aspirin  81 mg Per Tube Daily  . bisacodyl  10 mg Oral Daily   Or  . bisacodyl  10 mg Rectal Daily  . chlorhexidine gluconate (MEDLINE KIT)  15 mL Mouth Rinse BID  . Chlorhexidine Gluconate Cloth  6 each Topical Daily  . docusate  200 mg Oral Daily  . feeding  supplement (PRO-STAT SUGAR FREE 64)  30 mL Per Tube BID  . insulin aspart  0-24 Units Subcutaneous Q4H  . levalbuterol  0.63 mg Nebulization TID  . magic mouthwash  5 mL Oral TID  . mouth rinse  15 mL Mouth Rinse 10 times per day  . pantoprazole sodium  40 mg Per Tube Daily  . sodium chloride flush  10-40 mL Intracatheter Q12H  . sodium chloride flush  3 mL Intravenous Q12H  . sodium chloride HYPERTONIC  4 mL Nebulization BID  . Warfarin - Physician Dosing Inpatient   Does not apply q1800   Continuous Infusions: . sodium chloride 10 mL/hr at 05/10/17 1401  . dexmedetomidine (PRECEDEX) IV infusion 0.4 mcg/kg/hr (05/10/17 1608)  . feeding supplement (VITAL 1.5 CAL) 1,000 mL (05/10/17 1400)  . heparin 1,850 Units/hr (05/10/17 0800)  . norepinephrine (LEVOPHED) Adult infusion 1 mcg/min (05/10/17 1608)  . piperacillin-tazobactam (ZOSYN)  IV 3.375 g (05/10/17 1614)   PRN Meds:.acetaminophen (TYLENOL) oral liquid 160 mg/5 mL, fentaNYL (SUBLIMAZE) injection, fentaNYL (SUBLIMAZE) injection, guaiFENesin, levalbuterol, ondansetron (ZOFRAN) IV, sodium chloride flush, sodium chloride flush, sorbitol  General appearance: alert and cooperative Neurologic: intact Heart: regular rate and rhythm, S1, S2 normal, no murmur, click, rub or gallop Lungs:  diminished breath sounds bibasilar Abdomen: soft, non-tender; bowel sounds normal; no masses,  no organomegaly Extremities: extremities normal, atraumatic, no cyanosis or edema and Homans sign is negative, no sign of DVT Wound: incision intact   Lab Results: CBC: Recent Labs  05/09/17 0655  05/10/17 0328 05/10/17 1621  WBC 16.3*  --  17.7*  --   HGB 7.9*  < > 7.4* 7.8*  HCT 25.0*  < > 23.4* 23.0*  PLT 413*  --  467*  --   < > = values in this interval not displayed. BMET:  Recent Labs  05/09/17 0655  05/10/17 0328 05/10/17 1621  NA 136  < > 137 139  K 3.8  < > 3.3* 3.9  CL 97*  < > 100* 99*  CO2 26  --  29  --   GLUCOSE 141*  < > 183*  143*  BUN 29*  < > 23* 25*  CREATININE 1.05  < > 0.94 0.70  CALCIUM 7.7*  --  7.5*  --   < > = values in this interval not displayed.  CMET: Lab Results  Component Value Date   WBC 17.7 (H) 05/10/2017   HGB 7.8 (L) 05/10/2017   HCT 23.0 (L) 05/10/2017   PLT 467 (H) 05/10/2017   GLUCOSE 143 (H) 05/10/2017   CHOL 199 01/22/2017   TRIG 71 01/22/2017   HDL 64 01/22/2017   LDLCALC 121 (H) 01/22/2017   ALT 153 (H) 05/09/2017   AST 156 (H) 05/09/2017   NA 139 05/10/2017   K 3.9 05/10/2017   CL 99 (L) 05/10/2017   CREATININE 0.70 05/10/2017   BUN 25 (H) 05/10/2017   CO2 29 05/10/2017   TSH 4.065 05/09/2017   INR 1.56 05/10/2017   HGBA1C 5.7 (H) 04/25/2017      PT/INR:  Recent Labs  05/10/17 0328  LABPROT 18.9*  INR 1.56   Radiology: Dg Chest Port 1 View  Result Date: 05/10/2017 CLINICAL DATA:  Intubation . EXAM: PORTABLE CHEST 1 VIEW COMPARISON:  05/09/2017 . FINDINGS: Endotracheal tube, feeding tube, right PICC line, 2 right chest tubes in stable position. Tiny stable right apical pneumothorax. Prior CABG and cardiac valve replacement. Stable cardiomegaly. Persistent left lower lobe atelectasis and left-sided pleural effusion. Diffuse bilateral chest wall subcutaneous emphysema again noted. Right rib fractures again noted. IMPRESSION: 1. Lines and tubes including 2 right chest tubes in stable position. Persistent tiny unchanged right apical pneumothorax. 2. Persistent prominent atelectasis and consolidation left lower lobe. Small left pleural effusion. Electronically Signed   By: Marcello Moores  Register   On: 05/10/2017 07:34     Assessment/Plan: S/P Procedure(s) (LRB): REDO STERNOTOMY (N/A) REPLACEMENT ASCENDING AORTA/ WHEAT PROCEDURE, HYPOTHERMIC CIRCULATORY ARREST AND CARDIOPULMONARY BYPASS, AND USE OF HEMASHIELD PLATINUM WOVEN DOUBLE VELOUR VASCULAR GRAFT 32 MM X 50 CM (N/A) TRANSESOPHAGEAL ECHOCARDIOGRAM (TEE) (N/A) Wean vent as tolerated  Heparin for mechanical valve    Jack Joseph 05/10/2017 5:51 PM

## 2017-05-10 NOTE — Progress Notes (Addendum)
Discussed  pt's progress with Dr. Isaiah Serge. Pt was back on PRVC at 1130 due to agitation and levophed restarted soon after. Will continue full support on vent and hold lasix today.

## 2017-05-10 NOTE — Progress Notes (Addendum)
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  Underwent ascending aortic replacement/wheat procedure with CABG.  Was progressing well post op and ambulating in hall.  However just after midnight 6/20 had VT arrest with CPR and shock x 3.  Intubated during ACLS, small R ptx noted post CPR and CT placed by CVTS.  PCCM consulted for vent management.   SUBJECTIVE:  Stable with no issues.  Awake, on weaning trials Levo is being titrated down  PAST MEDICAL HISTORY :  He  has a past medical history of AVD (aortic valve disease); Chronic anticoagulation; Dilated cardiomyopathy (HCC); Dysrhythmia; Hyperlipidemia; and LV dysfunction.  PAST SURGICAL HISTORY: He  has a past surgical history that includes Aortic valve replacement; doppler echocardiography (04/07/2002); Right/Left Heart Cath and Coronary Angiography (N/A, 03/05/2017); Bentall procedure (N/A, 04/29/2017); and TEE without cardioversion (N/A, 04/29/2017).  Allergies  Allergen Reactions  . No Known Allergies     No current facility-administered medications on file prior to encounter.    Current Outpatient Prescriptions on File Prior to Encounter  Medication Sig  . carvedilol (COREG) 3.125 MG tablet Take 2 tablets (6.25 mg total) by mouth 2 (two) times daily.  . colchicine 0.6 MG tablet Take 0.6 mg by mouth daily as needed.  Marland Kitchen losartan (COZAAR) 25 MG tablet Take 1 tablet (25 mg total) by mouth daily.  . methylcellulose (CITRUCEL) oral powder Take 1 packet by mouth 2 (two) times daily.   . sildenafil  (REVATIO) 20 MG tablet TAKE 1-5 TABLETS BY MOUTH AS NEEDED FOR SEXUAL ACTIVITY (Patient not taking: Reported on 04/25/2017)  . warfarin (COUMADIN) 5 MG tablet Take 1 tablet by mouth daily or as directed by [facility] Clinic (Patient not taking: Reported on 04/25/2017)    FAMILY HISTORY:  His indicated that his mother is alive. He indicated that his father is deceased. He indicated that his brother is deceased. He indicated that his maternal grandmother is deceased. He indicated that his maternal grandfather is deceased. He indicated that his paternal grandmother is deceased. He indicated that his paternal grandfather is deceased.    SOCIAL HISTORY: He  reports that he quit smoking about 16 years ago. He has never used smokeless tobacco. He reports that he does not drink alcohol or use drugs.  REVIEW OF SYSTEMS:   Unable to obtain as pt is intubated  VITAL SIGNS: BP (!) 94/52   Pulse 89   Temp 97.7 F (36.5 C) (Oral)   Resp (!) 31   Ht 5\' 10"  (1.778 m)   Wt 150 lb 2.1 oz (68.1 kg)   SpO2 100%   BMI 21.54 kg/m   HEMODYNAMICS: CVP:  [6 mmHg-26 mmHg] 9 mmHg  VENTILATOR SETTINGS: Vent Mode: CPAP;PSV FiO2 (%):  [40 %] 40 % Set Rate:  [16 bmp] 16 bmp Vt Set:  [580 mL] 580 mL PEEP:  [5 cmH20] 5 cmH20 Pressure Support:  [8 cmH20-10 cmH20] 8 cmH20 Plateau Pressure:  [17 cmH20-20 cmH20] 18 cmH20  INTAKE / OUTPUT: I/O last 3 completed shifts: In: 5365 [I.V.:1760; NG/GT:2755; IV Piggyback:850] Out: 2420 [  Urine:2270; Chest Tube:150]  PHYSICAL EXAMINATION: Gen:      No acute distress HEENT:  EOMI, sclera anicteric, ETT in place Neck:     No masses; no thyromegaly Lungs:    Clear to auscultation bilaterally; normal respiratory effort CV:         Regular rate and rhythm; no murmurs Abd:      + bowel sounds; soft, non-tender; no palpable masses, no distension Ext:    No edema; adequate peripheral perfusion Skin:      Warm and dry; no rash Neuro: Awake, no focal  deficits  LABS:  BMET  Recent Labs Lab 05/08/17 0450  05/09/17 0655 05/09/17 1514 05/10/17 0328  NA 134*  < > 136 137 137  K 4.1  < > 3.8 3.6 3.3*  CL 96*  < > 97* 94* 100*  CO2 31  --  26  --  29  BUN 28*  < > 29* 27* 23*  CREATININE 1.01  < > 1.05 0.90 0.94  GLUCOSE 158*  < > 141* 168* 183*  < > = values in this interval not displayed.  Electrolytes  Recent Labs Lab 05/03/17 1845  05/04/17 0429 05/05/17 0337 05/06/17 0338  05/08/17 0450 05/09/17 0655 05/10/17 0328  CALCIUM  --   < > 7.5* 7.7* 7.8*  < > 7.8* 7.7* 7.5*  MG 2.3  --  2.0 1.9 1.7  --  1.9  --   --   PHOS 2.9  --  2.7  --   --   --   --   --   --   < > = values in this interval not displayed.  CBC  Recent Labs Lab 05/08/17 0450  05/09/17 0655 05/09/17 1514 05/10/17 0328  WBC 10.5  --  16.3*  --  17.7*  HGB 8.7*  < > 7.9* 8.2* 7.4*  HCT 27.0*  < > 25.0* 24.0* 23.4*  PLT 434*  --  413*  --  467*  < > = values in this interval not displayed.  Coag's  Recent Labs Lab 05/08/17 0450 05/09/17 0415 05/10/17 0328  INR 1.80 1.66 1.56    Sepsis Markers  Recent Labs Lab 05/06/17 0958 05/07/17 0401 05/08/17 0450  PROCALCITON 1.04 0.80 0.50    ABG  Recent Labs Lab 05/08/17 0410 05/09/17 0330 05/10/17 0420  PHART 7.508* 7.494* 7.471*  PCO2ART 40.8 40.9 41.3  PO2ART 58.8* 79.1* 97.6    Liver Enzymes  Recent Labs Lab 05/04/17 0429 05/09/17 0655  AST 22 156*  ALT 18 153*  ALKPHOS 49 115  BILITOT 0.8 1.3*  ALBUMIN 2.3* 1.9*    Cardiac Enzymes No results for input(s): TROPONINI, PROBNP in the last 168 hours.  Glucose  Recent Labs Lab 05/09/17 0736 05/09/17 1138 05/09/17 1911 05/09/17 2331 05/10/17 0344 05/10/17 0739  GLUCAP 146* 153* 120* 139* 151* 140*    Imaging CXR 6/26> Left lung opacification, extensive SQ emphysema. Small right pneumothorax I have reviewed the images personally  STUDIES:  2D echo 6/20>>> - Left ventricle: The cavity size was normal.  Wall thickness was normal. Systolic function was severely reduced. The estimated ejection fraction was in the range of 10% to 15%. Doppler parameters are consistent with abnormal left ventricular relaxation (grade 1 diastolic dysfunction). - Aortic valve: A mechanical prosthesis was present. - Mitral valve: There was moderate regurgitation. - Left atrium: The atrium was mildly dilated. - Right ventricle: Systolic function was severely reduced. - Right atrium: The atrium was mildly dilated. -  Tricuspid valve: There was mild-moderate regurgitation. - Pericardium, extracardiac: There was a left pleural effusion.  CULTURES: Sputum 6/24 >>> Normal resp flora Bronch washings 6/26 >> no growth  ANTIBIOTICS: Vanc 6/24>>> 6/28 Zosyn 6/24>>>  SIGNIFICANT EVENTS: 6/21 Extubated, reintubated 6/24 Extubated 6/26 Bronch, reintubated  LINES/TUBES: ETT 6/20>>> 6/21; 6/21 >> 6/24; 6/26>> R chest tube #1 6/20>>> R chest tube #2 6/21 >>   ASSESSMENT / PLAN: 71yo male with VT arrest POD#9 thoracic aortic aneurysm repair.  Suffered VT arrest, recurrent resp failure secondary to secretions, rt pneumothorax and LLL HCAP.  Acute respiratory failure - post VT arrest  HCAP Hx COPD  Right pneumothorax (after CPR) status post chest tube 2 Severe left-sided atelectasis with mucus plugging Plan: Continue vent support PSV weans as tolerted Follow CXR  Possible HCAP Elevated Pct, Elevated WBC Plan Continue zosyn day 6 Off vanco Recheck Pct  VT arrest 6/20 - unclear etiology.  Acute on chronic CHF - EF 10-15%; Shock - cardiogenic.   Repair thoracic aortic aneurysm 6/18 Hx AVR   Plan Wean off levophed Consider lasix when off pressors Heparin anticoagulation  Protein calorie malnutrition  Plan Tube feeds  Hyperglycemia   Plan:   SSI coverage  ICU delirium Plan:   Precedex for sedation  FAMILY  - Updates:  Wife and son updated 6/29  DVT prophylaxis: Heparin  gtt SUP: PPI Diet: Tube feeds Activity: bedrest  Disposition : icu  The patient is critically ill with multiple organ system failure and requires high complexity decision making for assessment and support, frequent evaluation and titration of therapies, advanced monitoring, review of radiographic studies and interpretation of complex data.   Critical Care Time devoted to patient care services, exclusive of separately billable procedures, described in this note is 36  minutes.   Chilton Greathouse MD [facility] Pager [phone] If no answer or after 3pm call: [phone] 05/10/2017, 10:10 AM

## 2017-05-11 LAB — GLUCOSE, CAPILLARY
Glucose-Capillary: 102 mg/dL — ABNORMAL HIGH (ref 65–99)
Glucose-Capillary: 118 mg/dL — ABNORMAL HIGH (ref 65–99)
Glucose-Capillary: 123 mg/dL — ABNORMAL HIGH (ref 65–99)
Glucose-Capillary: 129 mg/dL — ABNORMAL HIGH (ref 65–99)
Glucose-Capillary: 129 mg/dL — ABNORMAL HIGH (ref 65–99)
Glucose-Capillary: 133 mg/dL — ABNORMAL HIGH (ref 65–99)
Glucose-Capillary: 137 mg/dL — ABNORMAL HIGH (ref 65–99)

## 2017-05-11 LAB — POCT I-STAT, CHEM 8
BUN: 22 mg/dL — ABNORMAL HIGH (ref 6–20)
Calcium, Ion: 1.08 mmol/L — ABNORMAL LOW (ref 1.15–1.40)
Chloride: 101 mmol/L (ref 101–111)
Creatinine, Ser: 0.6 mg/dL — ABNORMAL LOW (ref 0.61–1.24)
Glucose, Bld: 151 mg/dL — ABNORMAL HIGH (ref 65–99)
HCT: 22 % — ABNORMAL LOW (ref 39.0–52.0)
Hemoglobin: 7.5 g/dL — ABNORMAL LOW (ref 13.0–17.0)
Potassium: 3.7 mmol/L (ref 3.5–5.1)
Sodium: 141 mmol/L (ref 135–145)
TCO2: 25 mmol/L (ref 0–100)

## 2017-05-11 LAB — BASIC METABOLIC PANEL
Anion gap: 7 (ref 5–15)
BUN: 23 mg/dL — ABNORMAL HIGH (ref 6–20)
CO2: 29 mmol/L (ref 22–32)
Calcium: 8.1 mg/dL — ABNORMAL LOW (ref 8.9–10.3)
Chloride: 101 mmol/L (ref 101–111)
Creatinine, Ser: 0.81 mg/dL (ref 0.61–1.24)
GFR calc Af Amer: >60 mL/min (ref >60)
GFR calc non Af Amer: >60 mL/min (ref >60)
Glucose, Bld: 150 mg/dL — ABNORMAL HIGH (ref 65–99)
Potassium: 3.8 mmol/L (ref 3.5–5.1)
Sodium: 137 mmol/L (ref 135–145)

## 2017-05-11 LAB — PROTIME-INR
INR: 1.34
Prothrombin Time: 16.6 seconds — ABNORMAL HIGH (ref 11.4–15.2)

## 2017-05-11 LAB — BLOOD GAS, ARTERIAL
Acid-Base Excess: 4.6 mmol/L — ABNORMAL HIGH (ref 0.0–2.0)
Bicarbonate: 28.4 mmol/L — ABNORMAL HIGH (ref 20.0–28.0)
Drawn by: 41977
FIO2: 40
MECHVT: 580 mL
O2 Saturation: 93.1 %
PEEP: 5 cmH2O
Patient temperature: 98.6
RATE: 16 resp/min
pCO2 arterial: 41.3 mmHg (ref 32.0–48.0)
pH, Arterial: 7.452 — ABNORMAL HIGH (ref 7.350–7.450)
pO2, Arterial: 69.2 mmHg — ABNORMAL LOW (ref 83.0–108.0)

## 2017-05-11 LAB — HEPARIN LEVEL (UNFRACTIONATED)
Heparin Unfractionated: 0.24 IU/mL — ABNORMAL LOW (ref 0.30–0.70)
Heparin Unfractionated: 0.31 IU/mL (ref 0.30–0.70)

## 2017-05-11 LAB — PREPARE RBC (CROSSMATCH)

## 2017-05-11 LAB — PROCALCITONIN: Procalcitonin: 0.27 ng/mL

## 2017-05-11 MED ORDER — FUROSEMIDE 10 MG/ML IJ SOLN
40.0000 mg | Freq: Once | INTRAMUSCULAR | Status: AC
  Administered 2017-05-11: 40 mg via INTRAVENOUS
  Filled 2017-05-11: qty 4

## 2017-05-11 MED ORDER — DEXMEDETOMIDINE HCL 200 MCG/2ML IV SOLN
0.4000 ug/kg/h | INTRAVENOUS | Status: DC
  Administered 2017-05-11: 0.6 ug/kg/h via INTRAVENOUS
  Administered 2017-05-11 (×2): 0.5 ug/kg/h via INTRAVENOUS
  Administered 2017-05-11: 0.6 ug/kg/h via INTRAVENOUS
  Administered 2017-05-12 (×2): 0.8 ug/kg/h via INTRAVENOUS
  Administered 2017-05-12: 0.4 ug/kg/h via INTRAVENOUS
  Filled 2017-05-11 (×7): qty 2

## 2017-05-11 MED ORDER — SODIUM CHLORIDE 0.9 % IV SOLN
Freq: Once | INTRAVENOUS | Status: AC
  Administered 2017-05-11: 10 mL/h via INTRAVENOUS

## 2017-05-11 MED ORDER — POTASSIUM CHLORIDE 10 MEQ/50ML IV SOLN
10.0000 meq | INTRAVENOUS | Status: AC
  Administered 2017-05-11 (×3): 10 meq via INTRAVENOUS
  Filled 2017-05-11 (×4): qty 50

## 2017-05-11 NOTE — Progress Notes (Signed)
ANTICOAGULATION CONSULT NOTE - Follow Up Consult  Pharmacy Consult for heparin Indication: AVR  Labs:  Recent Labs  05/09/17 0415 05/09/17 0655  05/09/17 1514  05/10/17 0328 05/10/17 1158 05/10/17 1621 05/11/17 0433  HGB  --  7.9*  --  8.2*  --  7.4*  --  7.8*  --   HCT  --  25.0*  --  24.0*  --  23.4*  --  23.0*  --   PLT  --  413*  --   --   --  467*  --   --   --   LABPROT 19.8*  --   --   --   --  18.9*  --   --  16.6*  INR 1.66  --   --   --   --  1.56  --   --  1.34  HEPARINUNFRC 0.11*  --   < >  --   < > 0.30 0.34  --  0.24*  CREATININE  --  1.05  --  0.90  --  0.94  --  0.70  --   < > = values in this interval not displayed.   Assessment: 71yo male now subtherapeutic on heparin after two levels at goal; RN reports no gtt interruptions or signs of bleeding.  Goal of Therapy:  Heparin level 0.3-0.5 units/ml   Plan:  Will increase heparin gtt by <10% to 2000 units/hr and check level in 6hr.  Vernard Gambles, PharmD, BCPS  05/11/2017,5:11 AM

## 2017-05-11 NOTE — Progress Notes (Signed)
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  Underwent ascending aortic replacement/wheat procedure with CABG.  Was progressing well post op and ambulating in hall.  However just after midnight 6/20 had VT arrest with CPR and shock x 3.  Intubated during ACLS, small R ptx noted post CPR and CT placed by CVTS.  PCCM consulted for vent management.   SUBJECTIVE:  Was on weaning trial yesterday but had to be put back on full support due to agitation Still on precedex and levo gtt  PAST MEDICAL HISTORY :  He  has a past medical history of AVD (aortic valve disease); Chronic anticoagulation; Dilated cardiomyopathy (HCC); Dysrhythmia; Hyperlipidemia; and LV dysfunction.  PAST SURGICAL HISTORY: He  has a past surgical history that includes Aortic valve replacement; doppler echocardiography (04/07/2002); Right/Left Heart Cath and Coronary Angiography (N/A, 03/05/2017); Bentall procedure (N/A, 04/29/2017); and TEE without cardioversion (N/A, 04/29/2017).  Allergies  Allergen Reactions  . No Known Allergies     No current facility-administered medications on file prior to encounter.    Current Outpatient Prescriptions on File Prior to Encounter  Medication Sig  . carvedilol (COREG) 3.125 MG tablet Take 2 tablets (6.25 mg total) by mouth 2 (two) times daily.  . colchicine 0.6 MG tablet Take 0.6 mg by mouth daily as needed.  Marland Kitchen losartan (COZAAR) 25 MG tablet Take 1 tablet (25 mg total) by mouth daily.  . methylcellulose (CITRUCEL) oral powder Take 1 packet by  mouth 2 (two) times daily.   . sildenafil (REVATIO) 20 MG tablet TAKE 1-5 TABLETS BY MOUTH AS NEEDED FOR SEXUAL ACTIVITY (Patient not taking: Reported on 04/25/2017)  . warfarin (COUMADIN) 5 MG tablet Take 1 tablet by mouth daily or as directed by [facility] Clinic (Patient not taking: Reported on 04/25/2017)    FAMILY HISTORY:  His indicated that his mother is alive. He indicated that his father is deceased. He indicated that his brother is deceased. He indicated that his maternal grandmother is deceased. He indicated that his maternal grandfather is deceased. He indicated that his paternal grandmother is deceased. He indicated that his paternal grandfather is deceased.    SOCIAL HISTORY: He  reports that he quit smoking about 16 years ago. He has never used smokeless tobacco. He reports that he does not drink alcohol or use drugs.  REVIEW OF SYSTEMS:   Unable to obtain as pt is intubated  VITAL SIGNS: BP 101/63   Pulse 89   Temp 98.8 F (37.1 C) (Oral)   Resp 17   Ht 5\' 10"  (1.778 m)   Wt 149 lb 4 oz (67.7 kg)   SpO2 100%   BMI 21.42 kg/m   HEMODYNAMICS: CVP:  [5 mmHg-17 mmHg] 5 mmHg  VENTILATOR SETTINGS: Vent Mode: PRVC FiO2 (%):  [40 %] 40 % Set Rate:  [16 bmp] 16 bmp Vt Set:  [580 mL] 580 mL PEEP:  [5 cmH20] 5 cmH20 Pressure Support:  [10 cmH20] 10 cmH20 Plateau Pressure:  [17 cmH20-20 cmH20] 20 cmH20  INTAKE / OUTPUT: I/O last 3 completed shifts: In: 33.6 [  I.V.:1537.6; NG/GT:2470; IV Piggyback:350] Out: 2265 [Urine:2155; Chest Tube:110]  PHYSICAL EXAMINATION: Gen:      No acute distress HEENT:  EOMI, sclera anicteric, ETT in place Neck:     No masses; no thyromegaly Lungs:    Clear to auscultation bilaterally; normal respiratory effort CV:         Regular rate and rhythm; no murmurs Abd:      + bowel sounds; soft, non-tender; no palpable masses, no distension Ext:    No edema; adequate peripheral perfusion Skin:      Warm and dry; no rash Neuro: No focal  deficits  LABS:  BMET  Recent Labs Lab 05/09/17 0655  05/10/17 0328 05/10/17 1621 05/11/17 0343  NA 136  < > 137 139 137  K 3.8  < > 3.3* 3.9 3.8  CL 97*  < > 100* 99* 101  CO2 26  --  29  --  29  BUN 29*  < > 23* 25* 23*  CREATININE 1.05  < > 0.94 0.70 0.81  GLUCOSE 141*  < > 183* 143* 150*  < > = values in this interval not displayed.  Electrolytes  Recent Labs Lab 05/05/17 0337 05/06/17 0338  05/08/17 0450 05/09/17 0655 05/10/17 0328 05/11/17 0343  CALCIUM 7.7* 7.8*  < > 7.8* 7.7* 7.5* 8.1*  MG 1.9 1.7  --  1.9  --   --   --   < > = values in this interval not displayed.  CBC  Recent Labs Lab 05/08/17 0450  05/09/17 0655 05/09/17 1514 05/10/17 0328 05/10/17 1621  WBC 10.5  --  16.3*  --  17.7*  --   HGB 8.7*  < > 7.9* 8.2* 7.4* 7.8*  HCT 27.0*  < > 25.0* 24.0* 23.4* 23.0*  PLT 434*  --  413*  --  467*  --   < > = values in this interval not displayed.  Coag's  Recent Labs Lab 05/09/17 0415 05/10/17 0328 05/11/17 0433  INR 1.66 1.56 1.34    Sepsis Markers  Recent Labs Lab 05/08/17 0450 05/10/17 1158 05/11/17 0343  PROCALCITON 0.50 0.32 0.27    ABG  Recent Labs Lab 05/09/17 0330 05/10/17 0420 05/11/17 0355  PHART 7.494* 7.471* 7.452*  PCO2ART 40.9 41.3 41.3  PO2ART 79.1* 97.6 69.2*    Liver Enzymes  Recent Labs Lab 05/09/17 0655  AST 156*  ALT 153*  ALKPHOS 115  BILITOT 1.3*  ALBUMIN 1.9*    Cardiac Enzymes No results for input(s): TROPONINI, PROBNP in the last 168 hours.  Glucose  Recent Labs Lab 05/10/17 1115 05/10/17 1547 05/10/17 1911 05/11/17 0000 05/11/17 0343 05/11/17 0723  GLUCAP 115* 129* 122* 123* 129* 129*    Imaging CXR 6/26> Left lung opacification, extensive SQ emphysema. Small right pneumothorax I have reviewed the images personally  STUDIES:  2D echo 6/20>>> - Left ventricle: The cavity size was normal. Wall thickness was normal. Systolic function was severely reduced. The  estimated ejection fraction was in the range of 10% to 15%. Doppler parameters are consistent with abnormal left ventricular relaxation (grade 1 diastolic dysfunction). - Aortic valve: A mechanical prosthesis was present. - Mitral valve: There was moderate regurgitation. - Left atrium: The atrium was mildly dilated. - Right ventricle: Systolic function was severely reduced. - Right atrium: The atrium was mildly dilated. - Tricuspid valve: There was mild-moderate regurgitation. - Pericardium, extracardiac: There was a left pleural effusion.  CULTURES: Sputum 6/24 >>> Normal resp flora Bronch washings 6/26 >>  no growth  ANTIBIOTICS: Vanc 6/24>>> 6/28 Zosyn 6/24>>>  SIGNIFICANT EVENTS: 6/21 Extubated, reintubated 6/24 Extubated 6/26 Bronch, reintubated  LINES/TUBES: ETT 6/20>>> 6/21; 6/21 >> 6/24; 6/26>> R chest tube #1 6/20>>> R chest tube #2 6/21 >>   ASSESSMENT / PLAN: 72yo male with VT arrest POD#9 thoracic aortic aneurysm repair.  Suffered VT arrest, recurrent resp failure secondary to secretions, rt pneumothorax and LLL HCAP.  Acute respiratory failure - post VT arrest  HCAP Hx COPD  Right pneumothorax (after CPR) status post chest tube 2 Severe left-sided atelectasis with mucus plugging Plan: Continue vent support PSV weans as tolerated Discussed with Dr. Tyrone Sage. We will optimize over the next day or 2 before re attempting extubation Order CXR for tomorrow  Possible HCAP Elevated Pct, Elevated WBC Plan Continue zosyn day 7 Off vanco Pct is improving  VT arrest 6/20 - unclear etiology.  Acute on chronic CHF - EF 10-15%; Shock - cardiogenic.   Repair thoracic aortic aneurysm 6/18 Hx AVR   Plan Wean off levophed Heparin anticoagulation  Protein calorie malnutrition  Plan Tube feeds  Hyperglycemia   Plan:   SSI coverage  ICU delirium Plan:   Precedex sedation  FAMILY  - Updates:  Wife and son updated 6/30  DVT prophylaxis:  Heparin gtt SUP: PPI Diet: Tube feeds Activity: bedrest  Disposition : icu  The patient is critically ill with multiple organ system failure and requires high complexity decision making for assessment and support, frequent evaluation and titration of therapies, advanced monitoring, review of radiographic studies and interpretation of complex data.   Critical Care Time devoted to patient care services, exclusive of separately billable procedures, described in this note is 32  minutes.   Chilton Greathouse MD [facility] Pager [phone] If no answer or after 3pm call: [phone] 05/11/2017, 8:47 AM

## 2017-05-11 NOTE — Progress Notes (Signed)
ANTICOAGULATION CONSULT NOTE Pharmacy Consult for Heparin Indication: mechanical AVR  Allergies  Allergen Reactions  . No Known Allergies     Patient Measurements: Height: 5\' 10"  (177.8 cm) Weight: 149 lb 4 oz (67.7 kg) IBW/kg (Calculated) : 73   Vital Signs: Temp: 98.8 F (37.1 C) (06/30 0800) Temp Source: Oral (06/30 0727) BP: 91/49 (06/30 1030) Pulse Rate: 90 (06/30 1030)  Labs:  Recent Labs  05/09/17 0415 05/09/17 0655  05/09/17 1514  05/10/17 0328 05/10/17 1158 05/10/17 1621 05/11/17 0343 05/11/17 0433  HGB  --  7.9*  --  8.2*  --  7.4*  --  7.8*  --   --   HCT  --  25.0*  --  24.0*  --  23.4*  --  23.0*  --   --   PLT  --  413*  --   --   --  467*  --   --   --   --   LABPROT 19.8*  --   --   --   --  18.9*  --   --   --  16.6*  INR 1.66  --   --   --   --  1.56  --   --   --  1.34  HEPARINUNFRC 0.11*  --   < >  --   < > 0.30 0.34  --   --  0.24*  CREATININE  --  1.05  --  0.90  --  0.94  --  0.70 0.81  --   < > = values in this interval not displayed.  Estimated Creatinine Clearance: 78.9 mL/min (by C-G formula based on SCr of 0.81 mg/dL).  . sodium chloride 10 mL/hr at 05/11/17 0800  . dexmedetomidine (PRECEDEX) IV infusion 0.5 mcg/kg/hr (05/11/17 0847)  . feeding supplement (VITAL 1.5 CAL) 1,000 mL (05/11/17 0800)  . heparin 2,000 Units/hr (05/11/17 0959)  . norepinephrine (LEVOPHED) Adult infusion 2 mcg/min (05/11/17 1000)  . piperacillin-tazobactam (ZOSYN)  IV Stopped (05/11/17 1030)     Assessment: 71 year old male with h/o CHF and mechanical AVR s/p ascending aorta replacement 04/29/17 for heparin  Heparin level at goal.  No bleeding or complications noted. CBC stable, INR 1.3, warfarin continues to be on hold.  Goal of Therapy:  Heparin level = 0.3 - 0.5 units / ml Monitor platelets by anticoagulation protocol: Yes   Plan:  Increase IV heparin to 2050 units/hr. Daily heparin level and CBC.  Sheppard Coil PharmD., BCPS Clinical  Pharmacist Pager [phone] 05/11/2017 10:58 AM

## 2017-05-11 NOTE — Progress Notes (Signed)
12 Days Post-Op Procedure(s) (LRB): REDO STERNOTOMY (N/A) REPLACEMENT ASCENDING AORTA/ WHEAT PROCEDURE, HYPOTHERMIC CIRCULATORY ARREST AND CARDIOPULMONARY BYPASS, AND USE OF HEMASHIELD PLATINUM WOVEN DOUBLE VELOUR VASCULAR GRAFT 32 MM X 50 CM (N/A) TRANSESOPHAGEAL ECHOCARDIOGRAM (TEE) (N/A) Subjective: Remains on vent but awake and alert. Failed weaning trial yesterday but not attempted today. CCM following.  Objective: Vital signs in last 24 hours: Temp:  [97.4 F (36.3 C)-99.3 F (37.4 C)] 99.3 F (37.4 C) (06/30 1325) Pulse Rate:  [87-91] 88 (06/30 1500) Cardiac Rhythm: Atrial paced (06/30 0800) Resp:  [16-31] 28 (06/30 1500) BP: (87-157)/(48-117) 105/52 (06/30 1500) SpO2:  [92 %-100 %] 98 % (06/30 1500) FiO2 (%):  [40 %] 40 % (06/30 1505) Weight:  [67.7 kg (149 lb 4 oz)] 67.7 kg (149 lb 4 oz) (06/30 0500)  Hemodynamic parameters for last 24 hours: CVP:  [5 mmHg-17 mmHg] 5 mmHg  Intake/Output from previous day: 06/29 0701 - 06/30 0700 In: 2460.8 [I.V.:940.8; NG/GT:1370; IV Piggyback:150] Out: 1455 [Urine:1405; Chest Tube:50] Intake/Output this shift: Total I/O In: 1023.2 [I.V.:373.2; NG/GT:550; IV Piggyback:100] Out: 650 [Urine:640; Chest Tube:10]  General appearance: alert and cooperative Neurologic: intact Heart: regular rate and rhythm, crisp mechanical valve sounds Lungs: clear to auscultation bilaterally Extremities: mild edema Wound: dressing dry  Lab Results:  Recent Labs  05/09/17 0655  05/10/17 0328 05/10/17 1621  WBC 16.3*  --  17.7*  --   HGB 7.9*  < > 7.4* 7.8*  HCT 25.0*  < > 23.4* 23.0*  PLT 413*  --  467*  --   < > = values in this interval not displayed. BMET:  Recent Labs  05/10/17 0328 05/10/17 1621 05/11/17 0343  NA 137 139 137  K 3.3* 3.9 3.8  CL 100* 99* 101  CO2 29  --  29  GLUCOSE 183* 143* 150*  BUN 23* 25* 23*  CREATININE 0.94 0.70 0.81  CALCIUM 7.5*  --  8.1*    PT/INR:  Recent Labs  05/11/17 0433  LABPROT 16.6*  INR  1.34   ABG    Component Value Date/Time   PHART 7.452 (H) 05/11/2017 0355   HCO3 28.4 (H) 05/11/2017 0355   TCO2 27 05/10/2017 1621   ACIDBASEDEF 3.0 (H) 05/01/2017 0121   O2SAT 93.1 05/11/2017 0355   CBG (last 3)   Recent Labs  05/11/17 0343 05/11/17 0723 05/11/17 1323  GLUCAP 129* 129* 102*   CLINICAL DATA:  Intubation .  EXAM: PORTABLE CHEST 1 VIEW  COMPARISON:  05/09/2017 .  FINDINGS: Endotracheal tube, feeding tube, right PICC line, 2 right chest tubes in stable position. Tiny stable right apical pneumothorax. Prior CABG and cardiac valve replacement. Stable cardiomegaly. Persistent left lower lobe atelectasis and left-sided pleural effusion. Diffuse bilateral chest wall subcutaneous emphysema again noted. Right rib fractures again noted.  IMPRESSION: 1. Lines and tubes including 2 right chest tubes in stable position. Persistent tiny unchanged right apical pneumothorax.  2. Persistent prominent atelectasis and consolidation left lower lobe. Small left pleural effusion.   Electronically Signed   By: Maisie Fus  Register   On: 05/10/2017 07:34   Assessment/Plan: S/P Procedure(s) (LRB): REDO STERNOTOMY (N/A) REPLACEMENT ASCENDING AORTA/ WHEAT PROCEDURE, HYPOTHERMIC CIRCULATORY ARREST AND CARDIOPULMONARY BYPASS, AND USE OF HEMASHIELD PLATINUM WOVEN DOUBLE VELOUR VASCULAR GRAFT 32 MM X 50 CM (N/A) TRANSESOPHAGEAL ECHOCARDIOGRAM (TEE) (N/A)  He is requiring levophed 2 mcg to maintain BP.  Sinus rhythm on amio Vent dependent resp failure: weaning per CCM Acute blood loss anemia: Hbg yesterday was 7.4.  Since he is still requiring Levophed will transfuse a unit of red cells. Repeat labs in am On heparin for mechanical valve.  Discussed status with family   LOS: 12 days    Alleen Borne 05/11/2017

## 2017-05-12 ENCOUNTER — Inpatient Hospital Stay (HOSPITAL_COMMUNITY): Payer: Medicare Other

## 2017-05-12 LAB — BASIC METABOLIC PANEL
Anion gap: 8 (ref 5–15)
BUN: 26 mg/dL — AB (ref 6–20)
CHLORIDE: 102 mmol/L (ref 101–111)
CO2: 27 mmol/L (ref 22–32)
Calcium: 8 mg/dL — ABNORMAL LOW (ref 8.9–10.3)
Creatinine, Ser: 0.9 mg/dL (ref 0.61–1.24)
GFR calc Af Amer: >60 mL/min (ref >60)
GFR calc non Af Amer: >60 mL/min (ref >60)
GLUCOSE: 144 mg/dL — AB (ref 65–99)
POTASSIUM: 4 mmol/L (ref 3.5–5.1)
SODIUM: 137 mmol/L (ref 135–145)

## 2017-05-12 LAB — PHOSPHORUS: Phosphorus: 4 mg/dL (ref 2.5–4.6)

## 2017-05-12 LAB — GLUCOSE, CAPILLARY
Glucose-Capillary: 130 mg/dL — ABNORMAL HIGH (ref 65–99)
Glucose-Capillary: 141 mg/dL — ABNORMAL HIGH (ref 65–99)
Glucose-Capillary: 121 mg/dL — ABNORMAL HIGH (ref 65–99)
Glucose-Capillary: 104 mg/dL — ABNORMAL HIGH (ref 65–99)

## 2017-05-12 LAB — BLOOD GAS, ARTERIAL
Acid-Base Excess: 2.7 mmol/L — ABNORMAL HIGH (ref 0.0–2.0)
Bicarbonate: 26.8 mmol/L (ref 20.0–28.0)
Drawn by: 36277
FIO2: 40
MECHVT: 580 mL
O2 Saturation: 94.8 %
PEEP: 5 cmH2O
Patient temperature: 98.6
RATE: 16 resp/min
pCO2 arterial: 42.3 mmHg (ref 32.0–48.0)
pH, Arterial: 7.419 (ref 7.350–7.450)
pO2, Arterial: 76.6 mmHg — ABNORMAL LOW (ref 83.0–108.0)

## 2017-05-12 LAB — TYPE AND SCREEN
ABO/RH(D): A POS
ANTIBODY SCREEN: NEGATIVE
UNIT DIVISION: 0

## 2017-05-12 LAB — POCT I-STAT, CHEM 8
BUN: 25 mg/dL — ABNORMAL HIGH (ref 6–20)
Calcium, Ion: 1.06 mmol/L — ABNORMAL LOW (ref 1.15–1.40)
Chloride: 102 mmol/L (ref 101–111)
Creatinine, Ser: 0.7 mg/dL (ref 0.61–1.24)
Glucose, Bld: 101 mg/dL — ABNORMAL HIGH (ref 65–99)
HCT: 26 % — ABNORMAL LOW (ref 39.0–52.0)
Hemoglobin: 8.8 g/dL — ABNORMAL LOW (ref 13.0–17.0)
Potassium: 4.1 mmol/L (ref 3.5–5.1)
Sodium: 141 mmol/L (ref 135–145)
TCO2: 26 mmol/L (ref 0–100)

## 2017-05-12 LAB — PROTIME-INR
INR: 1.32
Prothrombin Time: 16.5 seconds — ABNORMAL HIGH (ref 11.4–15.2)

## 2017-05-12 LAB — PROCALCITONIN: Procalcitonin: 0.25 ng/mL

## 2017-05-12 LAB — CBC
HEMATOCRIT: 25.5 % — AB (ref 39.0–52.0)
HEMOGLOBIN: 8.1 g/dL — AB (ref 13.0–17.0)
MCH: 29.7 pg (ref 26.0–34.0)
MCHC: 31.8 g/dL (ref 30.0–36.0)
MCV: 93.4 fL (ref 78.0–100.0)
Platelets: 648 10*3/uL — ABNORMAL HIGH (ref 150–400)
RBC: 2.73 MIL/uL — AB (ref 4.22–5.81)
RDW: 15.7 % — ABNORMAL HIGH (ref 11.5–15.5)
WBC: 14.9 10*3/uL — ABNORMAL HIGH (ref 4.0–10.5)

## 2017-05-12 LAB — HEPARIN LEVEL (UNFRACTIONATED): Heparin Unfractionated: 0.27 IU/mL — ABNORMAL LOW (ref 0.30–0.70)

## 2017-05-12 LAB — MAGNESIUM: MAGNESIUM: 2.3 mg/dL (ref 1.7–2.4)

## 2017-05-12 LAB — BPAM RBC
Blood Product Expiration Date: 201807042359
ISSUE DATE / TIME: 201806301753
UNIT TYPE AND RH: 6200

## 2017-05-12 MED ORDER — WARFARIN - PHYSICIAN DOSING INPATIENT
Freq: Every day | Status: DC
  Administered 2017-05-12 – 2017-05-14 (×3)

## 2017-05-12 MED ORDER — WARFARIN SODIUM 2.5 MG PO TABS
2.5000 mg | ORAL_TABLET | Freq: Once | ORAL | Status: AC
  Administered 2017-05-12: 2.5 mg via NASOGASTRIC
  Filled 2017-05-12: qty 1

## 2017-05-12 MED ORDER — MIDAZOLAM HCL 2 MG/2ML IJ SOLN
1.0000 mg | Freq: Once | INTRAMUSCULAR | Status: AC
  Administered 2017-05-12: 2 mg via INTRAVENOUS
  Filled 2017-05-12: qty 2

## 2017-05-12 MED ORDER — OXYCODONE HCL 5 MG/5ML PO SOLN
7.5000 mg | ORAL | Status: DC | PRN
  Administered 2017-05-13: 7.5 mg
  Filled 2017-05-12: qty 10

## 2017-05-12 NOTE — Progress Notes (Signed)
13 Days Post-Op Procedure(s) (LRB): REDO STERNOTOMY (N/A) REPLACEMENT ASCENDING AORTA/ WHEAT PROCEDURE, HYPOTHERMIC CIRCULATORY ARREST AND CARDIOPULMONARY BYPASS, AND USE OF HEMASHIELD PLATINUM WOVEN DOUBLE VELOUR VASCULAR GRAFT 32 MM X 50 CM (N/A) TRANSESOPHAGEAL ECHOCARDIOGRAM (TEE) (N/A) Subjective:  Extubated this am. No complaints  Had small amount of nose bleeding this am but looks fine now.   Objective: Vital signs in last 24 hours: Temp:  [97.7 F (36.5 C)-98.7 F (37.1 C)] 98.1 F (36.7 C) (07/01 1139) Pulse Rate:  [41-90] 84 (07/01 1300) Cardiac Rhythm: Normal sinus rhythm (07/01 1200) Resp:  [14-31] 25 (07/01 1300) BP: (66-160)/(45-74) 97/55 (07/01 1300) SpO2:  [91 %-100 %] 94 % (07/01 1300) FiO2 (%):  [40 %] 40 % (07/01 1133) Weight:  [69 kg (152 lb 1.9 oz)] 69 kg (152 lb 1.9 oz) (07/01 0500)  Hemodynamic parameters for last 24 hours: CVP:  [4 mmHg-12 mmHg] 9 mmHg  Intake/Output from previous day: 06/30 0701 - 07/01 0700 In: 3460.2 [I.V.:1320.2; Blood:350; NG/GT:1440; IV Piggyback:350] Out: 2665 [Urine:2645; Chest Tube:20] Intake/Output this shift: Total I/O In: 604.8 [I.V.:274.8; NG/GT:330] Out: 454 [Urine:394; Chest Tube:60]  Awake and alert Neurologic: intact Heart: regular rate and rhythm, crisp mechanical valve sounds,  no murmur, click, rub or gallop Lungs: clear to auscultation bilaterally No air leak from chest tubes. Abdomen: soft, non-tender; bowel sounds normal; no masses,  no organomegaly Extremities: extremities normal, atraumatic, no cyanosis or edema  Lab Results:  Recent Labs  05/10/17 0328  05/11/17 1706 05/12/17 0432  WBC 17.7*  --   --  14.9*  HGB 7.4*  < > 7.5* 8.1*  HCT 23.4*  < > 22.0* 25.5*  PLT 467*  --   --  648*  < > = values in this interval not displayed. BMET:  Recent Labs  05/11/17 0343 05/11/17 1706 05/12/17 0432  NA 137 141 137  K 3.8 3.7 4.0  CL 101 101 102  CO2 29  --  27  GLUCOSE 150* 151* 144*  BUN 23*  22* 26*  CREATININE 0.81 0.60* 0.90  CALCIUM 8.1*  --  8.0*    PT/INR:  Recent Labs  05/12/17 0432  LABPROT 16.5*  INR 1.32   ABG    Component Value Date/Time   PHART 7.419 05/12/2017 0225   HCO3 26.8 05/12/2017 0225   TCO2 25 05/11/2017 1706   ACIDBASEDEF 3.0 (H) 05/01/2017 0121   O2SAT 94.8 05/12/2017 0225   CBG (last 3)   Recent Labs  05/12/17 0501 05/12/17 0856 05/12/17 1133  GLUCAP 130* 141* 121*   CLINICAL DATA:  Acute respiratory failure. Postop from Bentall procedure for ascending aortic aneurysm.  EXAM: PORTABLE CHEST 1 VIEW  COMPARISON:  05/10/2017  FINDINGS: Endotracheal tube, feeding tube, and 2 right chest tubes remain in place. A tiny 5-10% right apical pneumothorax remains stable. Atelectasis or consolidation in the retrocardiac left lung base shows no significant change. Heart size is stable. Prostatic aortic valve again noted.  IMPRESSION: Stable small 5-10% right apical pneumothorax.  No significant change in left retrocardiac atelectasis versus consolidation.   Electronically Signed   By: Myles Rosenthal M.D.   On: 05/12/2017 10:20 Assessment/Plan: S/P Procedure(s) (LRB): REDO STERNOTOMY (N/A) REPLACEMENT ASCENDING AORTA/ WHEAT PROCEDURE, HYPOTHERMIC CIRCULATORY ARREST AND CARDIOPULMONARY BYPASS, AND USE OF HEMASHIELD PLATINUM WOVEN DOUBLE VELOUR VASCULAR GRAFT 32 MM X 50 CM (N/A) TRANSESOPHAGEAL ECHOCARDIOGRAM (TEE) (N/A)  Hemodynamically stable in sinus rhythm on oral amio. Weaning levophed.  VDRF: extubated this am and stable so far. Tiny  right apical ptx on CXR. Subcutaneous emphysema stable and no air leak from chest tubes. Will probably be able to remove one tomorrow.  Continue tube feeds. Plan speech therapy evaluation of swallowing tomorrow.  Start coumadin for mechanical AVR. Continue heparin today. DC ASA.  Acute blood loss anemia. Transfused 1 unit yesterday and Hgb better. Repeat in am.   LOS: 13 days    Alleen Borne 05/12/2017

## 2017-05-12 NOTE — Progress Notes (Signed)
ANTICOAGULATION CONSULT NOTE Pharmacy Consult for Heparin Indication: mechanical AVR  Allergies  Allergen Reactions  . No Known Allergies     Patient Measurements: Height: 5\' 10"  (177.8 cm) Weight: 152 lb 1.9 oz (69 kg) IBW/kg (Calculated) : 73   Vital Signs: Temp: 98 F (36.7 C) (07/01 0500) Temp Source: Axillary (07/01 0500) BP: 120/62 (07/01 0600) Pulse Rate: 89 (07/01 0600)  Labs:  Recent Labs  05/10/17 0328  05/10/17 1621 05/11/17 0343 05/11/17 0433 05/11/17 1215 05/11/17 1706 05/12/17 0432 05/12/17 0433  HGB 7.4*  --  7.8*  --   --   --  7.5* 8.1*  --   HCT 23.4*  --  23.0*  --   --   --  22.0* 25.5*  --   PLT 467*  --   --   --   --   --   --  648*  --   LABPROT 18.9*  --   --   --  16.6*  --   --  16.5*  --   INR 1.56  --   --   --  1.34  --   --  1.32  --   HEPARINUNFRC 0.30  < >  --   --  0.24* 0.31  --   --  0.27*  CREATININE 0.94  --  0.70 0.81  --   --  0.60* 0.90  --   < > = values in this interval not displayed.  Estimated Creatinine Clearance: 72.4 mL/min (by C-G formula based on SCr of 0.9 mg/dL).  . sodium chloride 10 mL/hr at 05/11/17 2300  . sodium chloride    . dexmedetomidine (PRECEDEX) IV infusion 0.8 mcg/kg/hr (05/12/17 0640)  . feeding supplement (VITAL 1.5 CAL) 1,000 mL (05/11/17 2300)  . heparin 2,050 Units/hr (05/11/17 2300)  . norepinephrine (LEVOPHED) Adult infusion 3.5 mcg/min (05/11/17 2300)  . piperacillin-tazobactam (ZOSYN)  IV 3.375 g (05/12/17 0627)   . sodium chloride 10 mL/hr at 05/11/17 2300  . sodium chloride    . dexmedetomidine (PRECEDEX) IV infusion 0.8 mcg/kg/hr (05/12/17 0640)  . feeding supplement (VITAL 1.5 CAL) 1,000 mL (05/11/17 2300)  . heparin 2,050 Units/hr (05/11/17 2300)  . norepinephrine (LEVOPHED) Adult infusion 3.5 mcg/min (05/11/17 2300)  . piperacillin-tazobactam (ZOSYN)  IV 3.375 g (05/12/17 5916)     Assessment: 71 year old male with h/o CHF and mechanical AVR s/p ascending aorta replacement  04/29/17 on IV heparin while Coumadin is on hold.  Per RN report, patient with nosebleed overnight.  Hgb up from yesterday (received PRBCs).  Heparin level just slightly below goal.  Goal of Therapy:  Heparin level = 0.3 - 0.5 units / ml Monitor platelets by anticoagulation protocol: Yes   Plan:  Given nosebleed, will not increase heparin for now. Continue IV Heparin at current rate of 2050 units/hr. Daily heparin level and CBC. F/u plans to resume Coumadin soon?  Tad Moore, BCPS  Clinical Pharmacist Pager [phone]  05/12/2017 7:35 AM

## 2017-05-12 NOTE — Progress Notes (Signed)
Nose bleed, moderate amount noted, saturating 2 gauze . Not consistent " discussed with Dr Vassie Loll and Pharmacist : no changes to POC at this time .

## 2017-05-12 NOTE — Progress Notes (Signed)
Patient very restless, attempting to get out of bed, borderline BP and H/R. Currently on precedex 0.8 and has been given fentenyl X 2 within 2 hours , patient continues to be impulsive and restless, anxious. Discussed with Dr Vassie Loll: try versed 1-2mg  X1

## 2017-05-12 NOTE — Progress Notes (Signed)
PULMONARY / CRITICAL CARE MEDICINE   Name: Jack Joseph MRN: [id number] DOB: 29-Dec-1945    ADMISSION DATE:  04/29/2017  CONSULTATION DATE:  6/20  REFERRING MD:  Tyrone Sage (CVTS)  CHIEF COMPLAINT:  Post arrest/ vent management   HISTORY OF PRESENT ILLNESS:   71yo male former smoker with hx remote AVR 1983 on chronic coumadin, COPD (FEV1 1.14 (35%), FEV1/FVC 47), LV dysfunction with EF 30-35%, dilated ascending aorta who initially presented 6/18 for elective surgical repair.  Underwent ascending aortic replacement/wheat procedure with CABG.  Was progressing well post op and ambulating in hall.  However just after midnight 6/20 had VT arrest with CPR and shock x 3.  Intubated during ACLS, small R ptx noted post CPR and CT placed by CVTS.  PCCM consulted for vent management.   SUBJECTIVE:  Weaning 5/5 since 7 am today Awake, follows commands.  PAST MEDICAL HISTORY :  He  has a past medical history of AVD (aortic valve disease); Chronic anticoagulation; Dilated cardiomyopathy (HCC); Dysrhythmia; Hyperlipidemia; and LV dysfunction.  PAST SURGICAL HISTORY: He  has a past surgical history that includes Aortic valve replacement; doppler echocardiography (04/07/2002); Right/Left Heart Cath and Coronary Angiography (N/A, 03/05/2017); Bentall procedure (N/A, 04/29/2017); and TEE without cardioversion (N/A, 04/29/2017).  Allergies  Allergen Reactions  . No Known Allergies     No current facility-administered medications on file prior to encounter.    Current Outpatient Prescriptions on File Prior to Encounter  Medication Sig  . carvedilol (COREG) 3.125 MG tablet Take 2 tablets (6.25 mg total) by mouth 2 (two) times daily.  . colchicine 0.6 MG tablet Take 0.6 mg by mouth daily as needed.  Marland Kitchen losartan (COZAAR) 25 MG tablet Take 1 tablet (25 mg total) by mouth daily.  . methylcellulose (CITRUCEL) oral powder Take 1 packet by mouth 2 (two) times daily.   . sildenafil (REVATIO) 20 MG tablet  TAKE 1-5 TABLETS BY MOUTH AS NEEDED FOR SEXUAL ACTIVITY (Patient not taking: Reported on 04/25/2017)  . warfarin (COUMADIN) 5 MG tablet Take 1 tablet by mouth daily or as directed by [facility] (Patient not taking: Reported on 04/25/2017)    FAMILY HISTORY:  His indicated that his mother is alive. He indicated that his father is deceased. He indicated that his brother is deceased. He indicated that his maternal grandmother is deceased. He indicated that his maternal grandfather is deceased. He indicated that his paternal grandmother is deceased. He indicated that his paternal grandfather is deceased.    SOCIAL HISTORY: He  reports that he quit smoking about 16 years ago. He has never used smokeless tobacco. He reports that he does not drink alcohol or use drugs.  REVIEW OF SYSTEMS:   Unable to obtain as pt is intubated  VITAL SIGNS: BP 106/61   Pulse 89   Temp 98.1 F (36.7 C) (Axillary)   Resp (!) 22   Ht 5\' 10"  (1.778 m)   Wt 152 lb 1.9 oz (69 kg)   SpO2 100%   BMI 21.83 kg/m   HEMODYNAMICS: CVP:  [4 mmHg-12 mmHg] 9 mmHg  VENTILATOR SETTINGS: Vent Mode: PSV;CPAP FiO2 (%):  [40 %] 40 % Set Rate:  [16 bmp] 16 bmp Vt Set:  [580 mL] 580 mL PEEP:  [5 cmH20] 5 cmH20 Pressure Support:  [5 cmH20-12 cmH20] 5 cmH20 Plateau Pressure:  [16 cmH20-21 cmH20] 16 cmH20  INTAKE / OUTPUT: I/O last 3 completed shifts: In: 4617.8 [I.V.:1757.8; Blood:350; NG/GT:2110; IV Piggyback:400] Out: 3365 [Urine:3345; Chest Tube:20]  PHYSICAL  EXAMINATION: Gen:      No acute distress HEENT:  EOMI, sclera anicteric Neck:     No masses; no thyromegaly Lungs:    B/L Rhonchi; normal respiratory effort CV:         Regular rate and rhythm; no murmurs Abd:      + bowel sounds; soft, non-tender; no palpable masses, no distension Ext:    No edema; adequate peripheral perfusion, crepitus over arms and chest Skin:      Warm and dry; no rash Neuro: Awake, no focal deficits  LABS:  BMET  Recent  Labs Lab 05/10/17 0328  05/11/17 0343 05/11/17 1706 05/12/17 0432  NA 137  < > 137 141 137  K 3.3*  < > 3.8 3.7 4.0  CL 100*  < > 101 101 102  CO2 29  --  29  --  27  BUN 23*  < > 23* 22* 26*  CREATININE 0.94  < > 0.81 0.60* 0.90  GLUCOSE 183*  < > 150* 151* 144*  < > = values in this interval not displayed.  Electrolytes  Recent Labs Lab 05/06/17 0338  05/08/17 0450  05/10/17 0328 05/11/17 0343 05/12/17 0432  CALCIUM 7.8*  < > 7.8*  < > 7.5* 8.1* 8.0*  MG 1.7  --  1.9  --   --   --  2.3  PHOS  --   --   --   --   --   --  4.0  < > = values in this interval not displayed.  CBC  Recent Labs Lab 05/09/17 0655  05/10/17 0328 05/10/17 1621 05/11/17 1706 05/12/17 0432  WBC 16.3*  --  17.7*  --   --  14.9*  HGB 7.9*  < > 7.4* 7.8* 7.5* 8.1*  HCT 25.0*  < > 23.4* 23.0* 22.0* 25.5*  PLT 413*  --  467*  --   --  648*  < > = values in this interval not displayed.  Coag's  Recent Labs Lab 05/10/17 0328 05/11/17 0433 05/12/17 0432  INR 1.56 1.34 1.32    Sepsis Markers  Recent Labs Lab 05/10/17 1158 05/11/17 0343 05/12/17 0432  PROCALCITON 0.32 0.27 0.25    ABG  Recent Labs Lab 05/10/17 0420 05/11/17 0355 05/12/17 0225  PHART 7.471* 7.452* 7.419  PCO2ART 41.3 41.3 42.3  PO2ART 97.6 69.2* 76.6*    Liver Enzymes  Recent Labs Lab 05/09/17 0655  AST 156*  ALT 153*  ALKPHOS 115  BILITOT 1.3*  ALBUMIN 1.9*    Cardiac Enzymes No results for input(s): TROPONINI, PROBNP in the last 168 hours.  Glucose  Recent Labs Lab 05/11/17 1323 05/11/17 1611 05/11/17 2030 05/11/17 2333 05/12/17 0501 05/12/17 0856  GLUCAP 102* 118* 137* 133* 130* 141*    Imaging CXR 6/26> Left lung opacification, extensive SQ emphysema. Small right pneumothorax I have reviewed the images personally  STUDIES:  2D echo 6/20>>> - Left ventricle: The cavity size was normal. Wall thickness was normal. Systolic function was severely reduced. The  estimated ejection fraction was in the range of 10% to 15%. Doppler parameters are consistent with abnormal left ventricular relaxation (grade 1 diastolic dysfunction). - Aortic valve: A mechanical prosthesis was present. - Mitral valve: There was moderate regurgitation. - Left atrium: The atrium was mildly dilated. - Right ventricle: Systolic function was severely reduced. - Right atrium: The atrium was mildly dilated. - Tricuspid valve: There was mild-moderate regurgitation. - Pericardium, extracardiac: There was a left pleural effusion.  CULTURES: Sputum 6/24 >>> Normal resp flora Bronch washings 6/26 >> no growth  ANTIBIOTICS: Vanc 6/24>>> 6/28 Zosyn 6/24>>>  SIGNIFICANT EVENTS: 6/21 Extubated, reintubated 6/24 Extubated 6/26 Bronch, reintubated  LINES/TUBES: ETT 6/20>>> 6/21; 6/21 >> 6/24; 6/26>> R chest tube #1 6/20>>> R chest tube #2 6/21 >>   ASSESSMENT / PLAN: 71yo male with VT arrest POD#9 thoracic aortic aneurysm repair.  Suffered VT arrest, recurrent resp failure secondary to secretions, rt pneumothorax and LLL HCAP.  Acute respiratory failure - post VT arrest  HCAP Hx COPD  Right pneumothorax (after CPR) status post chest tube 2 Severe left-sided atelectasis with mucus plugging Plan: On PSV weans. This is the best he has looked all week Will plan on extubation today Continue nebs, pulmonary toilette CT management per CVTS  Possible HCAP Elevated Pct, Elevated WBC Plan Continue zosyn day 7/10. Pct is improving.  Off vanco  VT arrest 6/20 - unclear etiology.  Acute on chronic CHF - EF 10-15%; Shock - cardiogenic.   Repair thoracic aortic aneurysm 6/18 Hx AVR   Plan Wean off levo Heparin anticoagulation  Protein calorie malnutrition  Plan Tube feeds  Hyperglycemia   Plan:   SSI coverage  ICU delirium Plan:   Precedex sedation  FAMILY  - Updates:  Wife and son updated 6/30  DVT prophylaxis: Heparin gtt SUP: PPI Diet:  Tube feeds Activity: bedrest  Disposition : icu  The patient is critically ill with multiple organ system failure and requires high complexity decision making for assessment and support, frequent evaluation and titration of therapies, advanced monitoring, review of radiographic studies and interpretation of complex data.   Critical Care Time devoted to patient care services, exclusive of separately billable procedures, described in this note is 32  minutes.   Chilton Greathouse MD [facility] Pager [phone] If no answer or after 3pm call: [phone] 05/12/2017, 11:50 AM

## 2017-05-12 NOTE — Procedures (Signed)
Extubation Procedure Note  Patient Details:   Name: Jack Joseph DOB: [birth year] MRN: [id number]   Airway Documentation:     Evaluation  O2 sats: stable throughout Complications: No apparent complications Patient did tolerate procedure well. Bilateral Breath Sounds: Clear   Yes   Patient extubated to 2L nasal cannula per MD order.  Positive cuff leak noted.  No evidence of stridor.  Patient able to speak post extubation.  Sats currently 95%.  Vitals are stable.  Incentive spirometry performed with RN with achieved goal of 400.  No apparent complications.  Ledell Noss N 05/12/2017, 11:58 AM

## 2017-05-13 ENCOUNTER — Inpatient Hospital Stay (HOSPITAL_COMMUNITY): Payer: Medicare Other

## 2017-05-13 DIAGNOSIS — R410 Disorientation, unspecified: Secondary | ICD-10-CM

## 2017-05-13 DIAGNOSIS — R739 Hyperglycemia, unspecified: Secondary | ICD-10-CM

## 2017-05-13 LAB — GLUCOSE, CAPILLARY
Glucose-Capillary: 108 mg/dL — ABNORMAL HIGH (ref 65–99)
Glucose-Capillary: 115 mg/dL — ABNORMAL HIGH (ref 65–99)
Glucose-Capillary: 117 mg/dL — ABNORMAL HIGH (ref 65–99)
Glucose-Capillary: 117 mg/dL — ABNORMAL HIGH (ref 65–99)
Glucose-Capillary: 119 mg/dL — ABNORMAL HIGH (ref 65–99)
Glucose-Capillary: 121 mg/dL — ABNORMAL HIGH (ref 65–99)

## 2017-05-13 LAB — POCT I-STAT, CHEM 8
BUN: 22 mg/dL — ABNORMAL HIGH (ref 6–20)
Calcium, Ion: 1.15 mmol/L (ref 1.15–1.40)
Chloride: 104 mmol/L (ref 101–111)
Creatinine, Ser: 0.6 mg/dL — ABNORMAL LOW (ref 0.61–1.24)
Glucose, Bld: 147 mg/dL — ABNORMAL HIGH (ref 65–99)
HCT: 26 % — ABNORMAL LOW (ref 39.0–52.0)
Hemoglobin: 8.8 g/dL — ABNORMAL LOW (ref 13.0–17.0)
Potassium: 3.8 mmol/L (ref 3.5–5.1)
Sodium: 141 mmol/L (ref 135–145)
TCO2: 25 mmol/L (ref 0–100)

## 2017-05-13 LAB — CBC
HEMATOCRIT: 29 % — AB (ref 39.0–52.0)
Hemoglobin: 9.1 g/dL — ABNORMAL LOW (ref 13.0–17.0)
MCH: 29.5 pg (ref 26.0–34.0)
MCHC: 31.4 g/dL (ref 30.0–36.0)
MCV: 94.2 fL (ref 78.0–100.0)
Platelets: 707 10*3/uL — ABNORMAL HIGH (ref 150–400)
RBC: 3.08 MIL/uL — ABNORMAL LOW (ref 4.22–5.81)
RDW: 15.6 % — AB (ref 11.5–15.5)
WBC: 14.7 10*3/uL — ABNORMAL HIGH (ref 4.0–10.5)

## 2017-05-13 LAB — BLOOD GAS, ARTERIAL
Acid-Base Excess: 2.1 mmol/L — ABNORMAL HIGH (ref 0.0–2.0)
Bicarbonate: 26.5 mmol/L (ref 20.0–28.0)
Drawn by: 36271
O2 Content: 3 L/min
O2 Saturation: 86.5 %
Patient temperature: 98.6
pCO2 arterial: 43.4 mmHg (ref 32.0–48.0)
pH, Arterial: 7.402 (ref 7.350–7.450)
pO2, Arterial: 54.8 mmHg — ABNORMAL LOW (ref 83.0–108.0)

## 2017-05-13 LAB — BASIC METABOLIC PANEL
Anion gap: 6 (ref 5–15)
BUN: 22 mg/dL — AB (ref 6–20)
CALCIUM: 8.2 mg/dL — AB (ref 8.9–10.3)
CO2: 27 mmol/L (ref 22–32)
CREATININE: 0.71 mg/dL (ref 0.61–1.24)
Chloride: 106 mmol/L (ref 101–111)
GFR calc non Af Amer: >60 mL/min (ref >60)
Glucose, Bld: 129 mg/dL — ABNORMAL HIGH (ref 65–99)
Potassium: 4.1 mmol/L (ref 3.5–5.1)
SODIUM: 139 mmol/L (ref 135–145)

## 2017-05-13 LAB — PROTIME-INR
INR: 1.29
Prothrombin Time: 16.2 seconds — ABNORMAL HIGH (ref 11.4–15.2)

## 2017-05-13 LAB — HEPARIN LEVEL (UNFRACTIONATED): Heparin Unfractionated: 0.45 IU/mL (ref 0.30–0.70)

## 2017-05-13 MED ORDER — LEVALBUTEROL HCL 0.63 MG/3ML IN NEBU
0.6300 mg | INHALATION_SOLUTION | Freq: Three times a day (TID) | RESPIRATORY_TRACT | Status: DC
  Administered 2017-05-13 – 2017-05-23 (×28): 0.63 mg via RESPIRATORY_TRACT
  Filled 2017-05-13 (×29): qty 3

## 2017-05-13 MED ORDER — WARFARIN SODIUM 2.5 MG PO TABS
2.5000 mg | ORAL_TABLET | Freq: Once | ORAL | Status: AC
  Administered 2017-05-13: 2.5 mg via NASOGASTRIC
  Filled 2017-05-13: qty 1

## 2017-05-13 MED ORDER — DIPHENHYDRAMINE HCL 12.5 MG/5ML PO ELIX
12.5000 mg | ORAL_SOLUTION | Freq: Every evening | ORAL | Status: DC | PRN
  Administered 2017-05-13: 12.5 mg
  Filled 2017-05-13: qty 5

## 2017-05-13 NOTE — Progress Notes (Signed)
Progress Note  Patient Name: Jack Joseph Date of Encounter: 05/13/2017  Primary Cardiologist: Jack Joseph  Subjective   Extubated. No complaints.   Inpatient Medications    Scheduled Meds: . amiodarone  200 mg Oral BID  . bisacodyl  10 mg Oral Daily   Or  . bisacodyl  10 mg Rectal Daily  . chlorhexidine gluconate (MEDLINE KIT)  15 mL Mouth Rinse BID  . Chlorhexidine Gluconate Cloth  6 each Topical Daily  . docusate  200 mg Oral Daily  . feeding supplement (PRO-STAT SUGAR FREE 64)  30 mL Per Tube BID  . insulin aspart  0-24 Units Subcutaneous Q4H  . levalbuterol  0.63 mg Nebulization TID  . magic mouthwash  5 mL Oral TID  . mouth rinse  15 mL Mouth Rinse 10 times per day  . pantoprazole sodium  40 mg Per Tube Daily  . sodium chloride flush  10-40 mL Intracatheter Q12H  . sodium chloride flush  3 mL Intravenous Q12H  . Warfarin - Physician Dosing Inpatient   Does not apply q1800   Continuous Infusions: . sodium chloride 10 mL/hr at 05/13/17 0600  . sodium chloride    . feeding supplement (VITAL 1.5 CAL) 1,000 mL (05/13/17 0600)  . heparin 2,050 Units/hr (05/13/17 0600)  . norepinephrine (LEVOPHED) Adult infusion Stopped (05/12/17 1745)  . piperacillin-tazobactam (ZOSYN)  IV 3.375 g (05/13/17 0626)   PRN Meds: acetaminophen (TYLENOL) oral liquid 160 mg/5 mL, guaiFENesin, levalbuterol, ondansetron (ZOFRAN) IV, oxyCODONE, sodium chloride flush, sodium chloride flush   Vital Signs    Vitals:   05/13/17 0400 05/13/17 0500 05/13/17 0600 05/13/17 0700  BP: 116/62  120/62 116/64  Pulse: 93 93 94 (!) 103  Resp: (!) 26 (!) 28 (!) 29 (!) 28  Temp:      TempSrc:      SpO2: 97% 93% 96% 95%  Weight:  149 lb 0.5 oz (67.6 kg)    Height:        Intake/Output Summary (Last 24 hours) at 05/13/17 0708 Last data filed at 05/13/17 0626  Gross per 24 hour  Intake          1941.31 ml  Output             1519 ml  Net           422.31 ml   Filed Weights   05/11/17 0500  05/12/17 0500 05/13/17 0500  Weight: 149 lb 4 oz (67.7 kg) 152 lb 1.9 oz (69 kg) 149 lb 0.5 oz (67.6 kg)    Telemetry    Nsr/sinus tachycardia - Personally Reviewed  ECG    none - Personally Reviewed  Physical Exam   GEN: No acute distress.   Neck: No JVD. Still with subcutaneous emphysema but improving Cardiac: RRR, no murmurs, rubs, or gallops.  Respiratory: Clear to auscultation bilaterally. GI: Soft, nontender, non-distended  MS: No edema; No deformity. Neuro:  Nonfocal  Psych: Normal affect   Labs    Chemistry Recent Labs Lab 05/09/17 0655  05/11/17 0343  05/12/17 0432 05/12/17 1622 05/13/17 0415  NA 136  < > 137  < > 137 141 139  K 3.8  < > 3.8  < > 4.0 4.1 4.1  CL 97*  < > 101  < > 102 102 106  CO2 26  < > 29  --  27  --  27  GLUCOSE 141*  < > 150*  < > 144* 101* 129*  BUN 29*  < >  23*  < > 26* 25* 22*  CREATININE 1.05  < > 0.81  < > 0.90 0.70 0.71  CALCIUM 7.7*  < > 8.1*  --  8.0*  --  8.2*  PROT 5.0*  --   --   --   --   --   --   ALBUMIN 1.9*  --   --   --   --   --   --   AST 156*  --   --   --   --   --   --   ALT 153*  --   --   --   --   --   --   ALKPHOS 115  --   --   --   --   --   --   BILITOT 1.3*  --   --   --   --   --   --   GFRNONAA >60  < > >60  --  >60  --  >60  GFRAA >60  < > >60  --  >60  --  >60  ANIONGAP 13  < > 7  --  8  --  6  < > = values in this interval not displayed.   Hematology Recent Labs Lab 05/10/17 0328  05/12/17 0432 05/12/17 1622 05/13/17 0415  WBC 17.7*  --  14.9*  --  14.7*  RBC 2.48*  --  2.73*  --  3.08*  HGB 7.4*  < > 8.1* 8.8* 9.1*  HCT 23.4*  < > 25.5* 26.0* 29.0*  MCV 94.4  --  93.4  --  94.2  MCH 29.8  --  29.7  --  29.5  MCHC 31.6  --  31.8  --  31.4  RDW 15.3  --  15.7*  --  15.6*  PLT 467*  --  648*  --  707*  < > = values in this interval not displayed.  Cardiac EnzymesNo results for input(s): TROPONINI in the last 168 hours. No results for input(s): TROPIPOC in the last 168 hours.   BNPNo  results for input(s): BNP, PROBNP in the last 168 hours.   DDimer No results for input(s): DDIMER in the last 168 hours.   Radiology    Dg Chest Port 1 View  Result Date: 05/12/2017 CLINICAL DATA:  Acute respiratory failure. Postop from Bentall procedure for ascending aortic aneurysm. EXAM: PORTABLE CHEST 1 VIEW COMPARISON:  05/10/2017 FINDINGS: Endotracheal tube, feeding tube, and 2 right chest tubes remain in place. A tiny 5-10% right apical pneumothorax remains stable. Atelectasis or consolidation in the retrocardiac left lung base shows no significant change. Heart size is stable. Prostatic aortic valve again noted. IMPRESSION: Stable small 5-10% right apical pneumothorax. No significant change in left retrocardiac atelectasis versus consolidation. Electronically Signed   By: Earle Gell M.D.   On: 05/12/2017 10:20    Cardiac Studies   none  Patient Profile     71 y.o. male with replacement of aorta with hemodynamically unstable VT, VDRF, anemia.   Assessment & Plan    1. VT - he is stable on oral amiodarone. Continue 2. VDRF - he is extubated but tenuous. Continue pulmonary toilet.  3. Acute on chronic systolic heart failure - he appears euvolemic. Follow I/O's and CXR 4. Coags - he is on IV heparin and will transition to coumadin.  Signed, Jack Peru, MD  05/13/2017, 7:08 AM  Patient ID: Jack Joseph, male   DOB:  16-Dec-1945, 71 y.o.   MRN: [id number]

## 2017-05-13 NOTE — Evaluation (Signed)
Clinical/Bedside Swallow Evaluation Patient Details  Name: Jack Joseph MRN: [id number] Date of Birth: [birth year]  Today's Date: 05/13/2017 Time: SLP Start Time (ACUTE ONLY): 1230 SLP Stop Time (ACUTE ONLY): 1300 SLP Time Calculation (min) (ACUTE ONLY): 30 min  Past Medical History:  Past Medical History:  Diagnosis Date  . AVD (aortic valve disease)   . Chronic anticoagulation   . Dilated cardiomyopathy (HCC)    EF 30-35%  . Dysrhythmia   . Hyperlipidemia   . LV dysfunction    Past Surgical History:  Past Surgical History:  Procedure Laterality Date  . AORTIC VALVE REPLACEMENT    . BENTALL PROCEDURE N/A 04/29/2017   Procedure: REPLACEMENT ASCENDING AORTA/ WHEAT PROCEDURE, HYPOTHERMIC CIRCULATORY ARREST AND CARDIOPULMONARY BYPASS, AND USE OF HEMASHIELD PLATINUM WOVEN DOUBLE VELOUR VASCULAR GRAFT 32 MM X 50 CM;  Surgeon: [name], MD;  Location: [facility] OR;  Service: Open Heart Surgery;  Laterality: N/A;  . DOPPLER ECHOCARDIOGRAPHY  04/07/2002   EF 30-35%  . RIGHT/LEFT HEART CATH AND CORONARY ANGIOGRAPHY N/A 03/05/2017   Procedure: Right/Left Heart Cath and Coronary Angiography;  Surgeon: Peter M Swaziland, MD;  Location: [facility] INVASIVE CV LAB;  Service: Cardiovascular;  Laterality: N/A;  . 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 Underwent ascending aortic replacement/wheat procedure with CABG. Was progressing well post op and ambulating in hall. However just after midnight 6/20 had VT arrest with CPR and shock x 3. Intubated during ACLS.  Right pneumothorax with chest tube x2. ETT 6/20-6/21; reintubated 6/21-6/24, reintubated 6/26-7/1.     Assessment / Plan / Recommendation Clinical Impression  Orders received for BSE following most recent extubation (total of 3 intubations, 11 days intubated). Pt completed oral care after set-up. Voice quality noted to be breathy and low in intensity, but laryngeal elevation was good to palpation and volitional cough was strong, but congested. Intermittent slight cough noted throughout evaluation, not necessarily following po trial. Pt tolerated trials of ice chips, water, puree, and soft solid with no immediate cough response or change in vital signs, however, pt risk for aspiration continues to be high given age, multiple intubations, deconditioning, and medical comorbidities. Given this, in addition to bedside presentation, will proceed with objective study to determine least restrictive diet. Given choice of FEES vs MBS, pt opts for MBS, and prefers to wait until tomorrow to have it done. Pt, wife, and RN aware of results and recommendations. ST will schedule MBS for tomorrow with radiology and proceed/follow up accordingly.  SLP Visit Diagnosis: Dysphagia, unspecified (R13.10)    Aspiration Risk  Moderate aspiration risk    Diet Recommendation NPO   Medication Administration: Via alternative means    Other  Recommendations Oral Care Recommendations: Oral care QID   Follow up Recommendations   pending MBS     Frequency and Duration   pending MBS         Prognosis Prognosis for Safe Diet Advancement: Good      Swallow Study   General Date of Onset: 04/29/17 HPI: 71 yo male former smoker with hx remote AVR 1983 on chronic coumadin, COPD, LV dysfunction with EF 30-35%, dilated ascending aorta who initially presented 6/18 for  elective surgical repair. Underwent ascending aortic replacement/wheat procedure with CABG. Was progressing well post op and ambulating in hall. However just after midnight 6/20 had VT arrest with CPR and shock x 3. Intubated during ACLS.  Right pneumothorax  with chest tube x2. ETT 6/20-6/21; reintubated 6/21-6/24, reintubated 6/26-7/1.  Type of Study: Bedside Swallow Evaluation Previous Swallow Assessment: BSE 05/06/17 Diet Prior to this Study: NPO Temperature Spikes Noted: No Respiratory Status: Nasal cannula History of Recent Intubation: Yes Length of Intubations (days): 11 days (intubated 3x) Date extubated: 05/12/17 Behavior/Cognition: Alert;Cooperative;Pleasant mood Oral Cavity Assessment: Within Functional Limits Oral Care Completed by SLP: Yes Oral Cavity - Dentition: Dentures, top;Dentures, bottom Vision: Functional for self-feeding Self-Feeding Abilities: Able to feed self;Needs set up Patient Positioning: Upright in chair Baseline Vocal Quality: Low vocal intensity;Breathy Volitional Cough: Strong;Congested Volitional Swallow: Able to elicit    Oral/Motor/Sensory Function Overall Oral Motor/Sensory Function: Within functional limits   Ice Chips Ice chips: Within functional limits Presentation: Spoon   Thin Liquid Thin Liquid: Within functional limits Presentation: Cup;Straw    Nectar Thick Nectar Thick Liquid: Not tested   Honey Thick Honey Thick Liquid: Not tested   Puree Puree: Within functional limits Presentation: Spoon   Solid   GO   Solid: Within functional limits Presentation: Spoon       Celia B. [place], MSP, CCC-SLP [phone]  Leigh Aurora 05/13/2017,1:32 PM

## 2017-05-13 NOTE — Progress Notes (Signed)
PULMONARY / CRITICAL CARE MEDICINE   Name: Jack Joseph MRN: [id number] DOB: [birth year]    ADMISSION DATE:  04/29/2017  CONSULTATION DATE:  6/20  REFERRING MD:  Tyrone Sage (CVTS)  CHIEF COMPLAINT:  Post arrest/ vent management   HISTORY OF PRESENT ILLNESS:   71yo male former smoker with hx remote AVR 1983 on chronic coumadin, COPD (FEV1 1.14 (35%), FEV1/FVC 47), LV dysfunction with EF 30-35%, dilated ascending aorta who initially presented 6/18 for elective surgical repair.  Underwent ascending aortic replacement/wheat procedure with CABG.  Was progressing well post op and ambulating in hall.  However just after midnight 6/20 had VT arrest with CPR and shock x 3.  Intubated during ACLS, small R ptx noted post CPR and CT placed by CVTS.  PCCM consulted for vent management.   SUBJECTIVE:  Tolerated extubation well.  No acute events.  VITAL SIGNS: BP 121/67 (BP Location: Left Arm)   Pulse 100   Temp 97.8 F (36.6 C) (Oral)   Resp (!) 23   Ht 5\' 10"  (1.778 m)   Wt 67.6 kg (149 lb 0.5 oz)   SpO2 99%   BMI 21.38 kg/m   HEMODYNAMICS: CVP:  [6 mmHg-9 mmHg] 6 mmHg  VENTILATOR SETTINGS: Vent Mode: PSV;CPAP FiO2 (%):  [40 %] 40 % PEEP:  [5 cmH20] 5 cmH20 Pressure Support:  [5 cmH20] 5 cmH20  INTAKE / OUTPUT: I/O last 3 completed shifts: In: 3995.2 [I.V.:1629.3; Blood:350; NG/GT:1715.8; IV Piggyback:300] Out: 3334 [Urine:3174; Chest Tube:160]  PHYSICAL EXAMINATION: Gen:      Alert and interactive, NAD HEENT:  EOMI, sclera anicteric Neck:      -JVD, supple Lungs:    CTA bilaterally CV:         Regular rate and rhythm; no murmurs Abd:      + bowel sounds; soft, non-tender; no palpable masses, no distension Ext:    No edema; adequate peripheral perfusion, crepitus over arms and chest Skin:       Warm and dry; no rash Neuro:    Awake and interactive, moving all ext to command  LABS:  BMET  Recent Labs Lab 05/11/17 0343  05/12/17 0432 05/12/17 1622 05/13/17 0415   NA 137  < > 137 141 139  K 3.8  < > 4.0 4.1 4.1  CL 101  < > 102 102 106  CO2 29  --  27  --  27  BUN 23*  < > 26* 25* 22*  CREATININE 0.81  < > 0.90 0.70 0.71  GLUCOSE 150*  < > 144* 101* 129*  < > = values in this interval not displayed.  Electrolytes  Recent Labs Lab 05/08/17 0450  05/11/17 0343 05/12/17 0432 05/13/17 0415  CALCIUM 7.8*  < > 8.1* 8.0* 8.2*  MG 1.9  --   --  2.3  --   PHOS  --   --   --  4.0  --   < > = values in this interval not displayed.  CBC  Recent Labs Lab 05/10/17 0328  05/12/17 0432 05/12/17 1622 05/13/17 0415  WBC 17.7*  --  14.9*  --  14.7*  HGB 7.4*  < > 8.1* 8.8* 9.1*  HCT 23.4*  < > 25.5* 26.0* 29.0*  PLT 467*  --  648*  --  707*  < > = values in this interval not displayed.  Coag's  Recent Labs Lab 05/11/17 0433 05/12/17 0432 05/13/17 0415  INR 1.34 1.32 1.29   Sepsis Markers  Recent  Labs Lab 05/10/17 1158 05/11/17 0343 05/12/17 0432  PROCALCITON 0.32 0.27 0.25   ABG  Recent Labs Lab 05/11/17 0355 05/12/17 0225 05/13/17 0425  PHART 7.452* 7.419 7.402  PCO2ART 41.3 42.3 43.4  PO2ART 69.2* 76.6* 54.8*   Liver Enzymes  Recent Labs Lab 05/09/17 0655  AST 156*  ALT 153*  ALKPHOS 115  BILITOT 1.3*  ALBUMIN 1.9*   Cardiac Enzymes No results for input(s): TROPONINI, PROBNP in the last 168 hours.  Glucose  Recent Labs Lab 05/12/17 0856 05/12/17 1133 05/12/17 2039 05/13/17 0001 05/13/17 0342 05/13/17 0728  GLUCAP 141* 121* 104* 117* 108* 121*   Imaging CXR 6/26> Left lung opacification, extensive SQ emphysema. Small right pneumothorax I have reviewed the images personally  STUDIES:  2D echo 6/20 > EF 10-15%, G1DD, mod MR, mild LA and RA dilation, severe reduction in RV function, mild-mod TR, left pleural effusion.  CULTURES: Sputum 6/24 >>> Normal resp flora Bronch washings 6/26 >> no growth  ANTIBIOTICS: Vanc 6/24 > 6/28 Zosyn 6/24 > stop date 7/3  SIGNIFICANT EVENTS: 6/21 Extubated,  reintubated 6/24 Extubated 6/26 Bronch, reintubated 7/1 Extubated  LINES/TUBES: ETT 6/20>>> 6/21; 6/21 >> 6/24; 6/26>> 7/1 R chest tube #1 6/20>>> R chest tube #2 6/21 >>   ASSESSMENT / PLAN: 71yo male with VT arrest POD#9 thoracic aortic aneurysm repair.  Suffered VT arrest, recurrent resp failure secondary to secretions, rt pneumothorax and LLL HCAP.  Acute respiratory failure - post VT arrest.  Extubated 7/1 HCAP Hx COPD  Right pneumothorax (after CPR) status post chest tube 2 Severe left-sided atelectasis with mucus plugging Plan: Continue nebs, pulmonary toilette CT management per CVTS  Possible HCAP Plan Continue zosyn day 9/10 (stop date 7/3)  VT arrest 6/20 - unclear etiology.  Acute on chronic CHF - EF 10-15%; Shock - cardiogenic.   Repair thoracic aortic aneurysm 6/18 Hx AVR  Plan Heparin anticoagulation (transition to warfarin), amiodarone.  Protein calorie malnutrition  Plan Continue tube feeds NPO until swallow eval completed  Hyperglycemia   Plan:   SSI coverage  ICU delirium - resolved Plan:   No further interventions required  FAMILY  - Updates:  Wife and son updated 6/30  Nothing further to add. PCCM will sign off.  Please do not hesitate to call us back if we can be of any further assistance.  Rutherford Guys, [place] [facility] Pager: [phone]  or [phone] 05/13/2017, 9:19 AM  Attending Note:  71 year old male s/p thoracic aneurysm repair and PTX requiring chest tubes that were removed.  I reviewed CXR myself, no acute disease noted.  On exam, lungs are clear.  Discussed with PCCM-NP.  Acute respiratory failure:  - Monitor for airway protection  - Ambulate  Hypoxemia:  - Titrate O2 for sat of 88-92%  ICU delirium  - Minimize sedation  - Ambulate  - PT  Hyperglycemia:  - ISS  - CBG  PCCM will sign off, please call back if needed.  Patient seen and examined, agree with above  note.  I dictated the care and orders written for this patient under my direction.  Alyson Reedy, MD [phone]

## 2017-05-13 NOTE — Progress Notes (Signed)
      [address]       [place] [address]             [phone]       BP 113/60   Pulse 99   Temp 97.3 F (36.3 C) (Oral)   Resp (!) 30   Ht 5\' 10"  (1.778 m)   Wt 149 lb 0.5 oz (67.6 kg)   SpO2 98%   BMI 21.38 kg/m    Intake/Output Summary (Last 24 hours) at 05/13/17 1814 Last data filed at 05/13/17 1700  Gross per 24 hour  Intake           2031.5 ml  Output             1595 ml  Net            436.5 ml   Remains NPO with NG in place  On Zosyn  Maisyn Nouri C. Dorris Fetch, MD [facility] [phone]

## 2017-05-13 NOTE — Progress Notes (Signed)
Nutrition Follow-up  INTERVENTION:   Continue:  Vital 1.5 @ 50 ml/hr (1200 ml/day) via Cortrak 30 ml Prostat BID Provides: 2000 kcal, 111 grams protein, and 916 ml free water.   NUTRITION DIAGNOSIS:   Increased nutrient needs related to wound healing as evidenced by estimated needs. Ongoing.   GOAL:   Patient will meet greater than or equal to 90% of their needs Met.   MONITOR:   Diet advancement, I & O's, TF tolerance  ASSESSMENT:   Pt with PMH of CHF, severe COPD, AVD, chronic anticoagulation, hyperlipidemia admitted with dilated ascending aorta s/p valve replacement 6/18.  Arrest 6/20.   Multiple intubations, 7/1 extubated 7/2 failed swallow eval, Cortrak tube remains in place Plan for MBS tomorrow (7/3)  CT with 150 ml out since 7 am Medications reviewed and include: dulcolax, colace, magic mouthwash Labs reviewed. CBG's: 121-119  Diet Order:  Diet NPO time specified  Skin:  Reviewed, no issues  Last BM:  7/1 large/type 7  Height:   Ht Readings from Last 1 Encounters:  05/02/17 '5\' 10"'$  (1.778 m)    Weight:   Wt Readings from Last 1 Encounters:  05/13/17 149 lb 0.5 oz (67.6 kg)    Ideal Body Weight:  75.4 kg  BMI:  Body mass index is 21.38 kg/m.  Estimated Nutritional Needs:   Kcal:  1800-2000  Protein:  100-115 grams  Fluid:  > 1.7 L/day  EDUCATION NEEDS:   No education needs identified at this time  Between, [place], [place] Pager [phone] After Hours Pager

## 2017-05-13 NOTE — Progress Notes (Addendum)
Patient ID: Jack Joseph, male   DOB: Jun 28, 1946, 71 y.o.   MRN: [id number] [facility] DAILY ICU PROGRESS NOTE                   [address]            [place] [address]          [phone]   14 Days Post-Op Procedure(s) (LRB): REDO STERNOTOMY (N/A) REPLACEMENT ASCENDING AORTA/ WHEAT PROCEDURE, HYPOTHERMIC CIRCULATORY ARREST AND CARDIOPULMONARY BYPASS, AND USE OF HEMASHIELD PLATINUM WOVEN DOUBLE VELOUR VASCULAR GRAFT 32 MM X 50 CM (N/A) TRANSESOPHAGEAL ECHOCARDIOGRAM (TEE) (N/A)  Total Length of Stay:  LOS: 14 days   Subjective:  Tolerating extubation.  No specific complaints.  Objective: Vital signs in last 24 hours: Temp:  [97.7 F (36.5 C)-98.8 F (37.1 C)] 98.3 F (36.8 C) (07/02 0338) Pulse Rate:  [71-102] 94 (07/02 0600) Cardiac Rhythm: Atrial paced (07/02 0400) Resp:  [13-31] 29 (07/02 0600) BP: (66-157)/(45-74) 120/62 (07/02 0600) SpO2:  [90 %-100 %] 96 % (07/02 0600) FiO2 (%):  [40 %] 40 % (07/01 1830) Weight:  [149 lb 0.5 oz (67.6 kg)] 149 lb 0.5 oz (67.6 kg) (07/02 0500)  Filed Weights   05/11/17 0500 05/12/17 0500 05/13/17 0500  Weight: 149 lb 4 oz (67.7 kg) 152 lb 1.9 oz (69 kg) 149 lb 0.5 oz (67.6 kg)    Weight change: -3 lb 1.4 oz (-1.4 kg)   Hemodynamic parameters for last 24 hours: CVP:  [6 mmHg-12 mmHg] 6 mmHg  Intake/Output from previous day: 07/01 0701 - 07/02 0700 In: 1941.3 [I.V.:815.5; NG/GT:975.8; IV Piggyback:150] Out: 1519 [Urine:1369; Chest Tube:150]  Intake/Output this shift: Total I/O In: 985.5 [I.V.:335.5; NG/GT:550; IV Piggyback:100] Out: 680 [Urine:610; Chest Tube:70]  Current Meds: Scheduled Meds: . amiodarone  200 mg Oral BID  . bisacodyl  10 mg Oral Daily   Or  . bisacodyl  10 mg Rectal Daily  . chlorhexidine gluconate (MEDLINE KIT)  15 mL Mouth Rinse BID  . Chlorhexidine Gluconate Cloth  6 each Topical Daily  . docusate  200 mg Oral Daily  . feeding supplement (PRO-STAT SUGAR FREE 64)  30 mL Per Tube  BID  . insulin aspart  0-24 Units Subcutaneous Q4H  . levalbuterol  0.63 mg Nebulization TID  . magic mouthwash  5 mL Oral TID  . mouth rinse  15 mL Mouth Rinse 10 times per day  . pantoprazole sodium  40 mg Per Tube Daily  . sodium chloride flush  10-40 mL Intracatheter Q12H  . sodium chloride flush  3 mL Intravenous Q12H  . Warfarin - Physician Dosing Inpatient   Does not apply q1800   Continuous Infusions: . sodium chloride 10 mL/hr at 05/13/17 0600  . sodium chloride    . feeding supplement (VITAL 1.5 CAL) 1,000 mL (05/13/17 0600)  . heparin 2,050 Units/hr (05/13/17 0600)  . norepinephrine (LEVOPHED) Adult infusion Stopped (05/12/17 1745)  . piperacillin-tazobactam (ZOSYN)  IV 3.375 g (05/13/17 0626)   PRN Meds:.acetaminophen (TYLENOL) oral liquid 160 mg/5 mL, guaiFENesin, levalbuterol, ondansetron (ZOFRAN) IV, oxyCODONE, sodium chloride flush, sodium chloride flush  General appearance: alert and cooperative Heart: regular rate and rhythm Lungs: clear to auscultation bilaterally Abdomen: soft, non-tender; bowel sounds normal; no masses,  no organomegaly Extremities: edema trace Wound: clean and dry  Lab Results: CBC: Recent Labs  05/12/17 0432 05/12/17 1622 05/13/17 0415  WBC 14.9*  --  14.7*  HGB 8.1* 8.8* 9.1*  HCT 25.5*  26.0* 29.0*  PLT 648*  --  707*   BMET:  Recent Labs  05/12/17 0432 05/12/17 1622 05/13/17 0415  NA 137 141 139  K 4.0 4.1 4.1  CL 102 102 106  CO2 27  --  27  GLUCOSE 144* 101* 129*  BUN 26* 25* 22*  CREATININE 0.90 0.70 0.71  CALCIUM 8.0*  --  8.2*    CMET: Lab Results  Component Value Date   WBC 14.7 (H) 05/13/2017   HGB 9.1 (L) 05/13/2017   HCT 29.0 (L) 05/13/2017   PLT 707 (H) 05/13/2017   GLUCOSE 129 (H) 05/13/2017   CHOL 199 01/22/2017   TRIG 71 01/22/2017   HDL 64 01/22/2017   LDLCALC 121 (H) 01/22/2017   ALT 153 (H) 05/09/2017   AST 156 (H) 05/09/2017   NA 139 05/13/2017   K 4.1 05/13/2017   CL 106 05/13/2017    CREATININE 0.71 05/13/2017   BUN 22 (H) 05/13/2017   CO2 27 05/13/2017   TSH 4.065 05/09/2017   INR 1.29 05/13/2017   HGBA1C 5.7 (H) 04/25/2017      PT/INR:  Recent Labs  05/13/17 0415  LABPROT 16.2*  INR 1.29   Radiology: No results found.   Assessment/Plan: S/P Procedure(s) (LRB): REDO STERNOTOMY (N/A) REPLACEMENT ASCENDING AORTA/ WHEAT PROCEDURE, HYPOTHERMIC CIRCULATORY ARREST AND CARDIOPULMONARY BYPASS, AND USE OF HEMASHIELD PLATINUM WOVEN DOUBLE VELOUR VASCULAR GRAFT 32 MM X 50 CM (N/A) TRANSESOPHAGEAL ECHOCARDIOGRAM (TEE) (N/A)  1. CV- NSR, remains stable on Amiodarone, off all drips, continue coumadin for previous Mechanical AVR 2. Pulm- continue aggressive pulmonary toilet, chest tube without air leak, management per Dr. Servando Snare 3. Renal- creatinine WNL, weight is below baseline 4. Dysphagia- SLP evaluation done today, recommends patient remains NPO 5. ID- possible aspiration pneumonia on Zosyn per CCM 6. Post operative blood loss anemia- Hgb at 9.1 post transfusion 7. Dispo- patient stable, continue current care, remain NPO per SLP recommendations     [name] 05/13/2017 6:52 AM

## 2017-05-13 NOTE — Progress Notes (Signed)
ANTICOAGULATION CONSULT NOTE Pharmacy Consult for Heparin Indication: mechanical AVR  Allergies  Allergen Reactions  . No Known Allergies     Patient Measurements: Height: 5\' 10"  (177.8 cm) Weight: 149 lb 0.5 oz (67.6 kg) IBW/kg (Calculated) : 73   Vital Signs: Temp: 97.8 F (36.6 C) (07/02 0700) Temp Source: Oral (07/02 0700) BP: 116/64 (07/02 0700) Pulse Rate: 103 (07/02 0700)  Labs:  Recent Labs  05/11/17 0433 05/11/17 1215  05/12/17 0432 05/12/17 0433 05/12/17 1622 05/13/17 0415  HGB  --   --   < > 8.1*  --  8.8* 9.1*  HCT  --   --   < > 25.5*  --  26.0* 29.0*  PLT  --   --   --  648*  --   --  707*  LABPROT 16.6*  --   --  16.5*  --   --  16.2*  INR 1.34  --   --  1.32  --   --  1.29  HEPARINUNFRC 0.24* 0.31  --   --  0.27*  --  0.45  CREATININE  --   --   < > 0.90  --  0.70 0.71  < > = values in this interval not displayed.  Estimated Creatinine Clearance: 79.8 mL/min (by C-G formula based on SCr of 0.71 mg/dL).  . sodium chloride 10 mL/hr at 05/13/17 0700  . sodium chloride    . feeding supplement (VITAL 1.5 CAL) 1,000 mL (05/13/17 0700)  . heparin 2,050 Units/hr (05/13/17 0700)  . norepinephrine (LEVOPHED) Adult infusion Stopped (05/12/17 1745)  . piperacillin-tazobactam (ZOSYN)  IV 3.375 g (05/13/17 0626)   . sodium chloride 10 mL/hr at 05/13/17 0700  . sodium chloride    . feeding supplement (VITAL 1.5 CAL) 1,000 mL (05/13/17 0700)  . heparin 2,050 Units/hr (05/13/17 0700)  . norepinephrine (LEVOPHED) Adult infusion Stopped (05/12/17 1745)  . 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 Coumadin resumed last night.  Heparin level at goal.  Hgb up from yesterday (received PRBCs).  No further nosebleeds overnight.  Patient reports issues with frequent nosebleeds for past several  years.  Goal of Therapy:  Heparin level = 0.3 - 0.5 units / ml Monitor platelets by anticoagulation protocol: Yes   Plan:  Continue IV Heparin at current rate of 2050 units/hr. Daily heparin level and CBC.  Tad Moore, BCPS  Clinical Pharmacist Pager [phone]  05/13/2017 7:47 AM

## 2017-05-14 ENCOUNTER — Inpatient Hospital Stay (HOSPITAL_COMMUNITY): Payer: Medicare Other

## 2017-05-14 DIAGNOSIS — R740 Nonspecific elevation of levels of transaminase and lactic acid dehydrogenase [LDH]: Secondary | ICD-10-CM

## 2017-05-14 DIAGNOSIS — R0902 Hypoxemia: Secondary | ICD-10-CM

## 2017-05-14 LAB — CBC
HCT: 29.2 % — ABNORMAL LOW (ref 39.0–52.0)
Hemoglobin: 9 g/dL — ABNORMAL LOW (ref 13.0–17.0)
MCH: 29.2 pg (ref 26.0–34.0)
MCHC: 30.8 g/dL (ref 30.0–36.0)
MCV: 94.8 fL (ref 78.0–100.0)
Platelets: 767 10*3/uL — ABNORMAL HIGH (ref 150–400)
RBC: 3.08 MIL/uL — ABNORMAL LOW (ref 4.22–5.81)
RDW: 15.3 % (ref 11.5–15.5)
WBC: 16.6 10*3/uL — ABNORMAL HIGH (ref 4.0–10.5)

## 2017-05-14 LAB — HEPATIC FUNCTION PANEL
ALT: 125 U/L — AB (ref 17–63)
AST: 70 U/L — ABNORMAL HIGH (ref 15–41)
Albumin: 1.8 g/dL — ABNORMAL LOW (ref 3.5–5.0)
Alkaline Phosphatase: 170 U/L — ABNORMAL HIGH (ref 38–126)
BILIRUBIN DIRECT: 0.3 mg/dL (ref 0.1–0.5)
BILIRUBIN INDIRECT: 0.4 mg/dL (ref 0.3–0.9)
Total Bilirubin: 0.7 mg/dL (ref 0.3–1.2)
Total Protein: 5.4 g/dL — ABNORMAL LOW (ref 6.5–8.1)

## 2017-05-14 LAB — BLOOD GAS, ARTERIAL
Acid-Base Excess: 2.8 mmol/L — ABNORMAL HIGH (ref 0.0–2.0)
Bicarbonate: 26.8 mmol/L (ref 20.0–28.0)
Drawn by: 36496
O2 Content: 4 L/min
O2 Saturation: 93.4 %
Patient temperature: 98.6
pCO2 arterial: 41 mmHg (ref 32.0–48.0)
pH, Arterial: 7.431 (ref 7.350–7.450)
pO2, Arterial: 69.3 mmHg — ABNORMAL LOW (ref 83.0–108.0)

## 2017-05-14 LAB — POCT I-STAT, CHEM 8
BUN: 20 mg/dL (ref 6–20)
Calcium, Ion: 1.2 mmol/L (ref 1.15–1.40)
Chloride: 104 mmol/L (ref 101–111)
Creatinine, Ser: 0.7 mg/dL (ref 0.61–1.24)
Glucose, Bld: 124 mg/dL — ABNORMAL HIGH (ref 65–99)
HCT: 26 % — ABNORMAL LOW (ref 39.0–52.0)
Hemoglobin: 8.8 g/dL — ABNORMAL LOW (ref 13.0–17.0)
Potassium: 3.7 mmol/L (ref 3.5–5.1)
Sodium: 142 mmol/L (ref 135–145)
TCO2: 27 mmol/L (ref 0–100)

## 2017-05-14 LAB — GLUCOSE, CAPILLARY
Glucose-Capillary: 115 mg/dL — ABNORMAL HIGH (ref 65–99)
Glucose-Capillary: 120 mg/dL — ABNORMAL HIGH (ref 65–99)
Glucose-Capillary: 133 mg/dL — ABNORMAL HIGH (ref 65–99)
Glucose-Capillary: 135 mg/dL — ABNORMAL HIGH (ref 65–99)
Glucose-Capillary: 98 mg/dL (ref 65–99)
Glucose-Capillary: 99 mg/dL (ref 65–99)

## 2017-05-14 LAB — PROTIME-INR
INR: 1.39
Prothrombin Time: 17.2 seconds — ABNORMAL HIGH (ref 11.4–15.2)

## 2017-05-14 LAB — HEPARIN LEVEL (UNFRACTIONATED): Heparin Unfractionated: 0.4 IU/mL (ref 0.30–0.70)

## 2017-05-14 MED ORDER — POTASSIUM CHLORIDE CRYS ER 20 MEQ PO TBCR: 20.0000 meq | EXTENDED_RELEASE_TABLET | ORAL | Status: DC | PRN

## 2017-05-14 MED ORDER — POTASSIUM CHLORIDE 10 MEQ/50ML IV SOLN
10.0000 meq | INTRAVENOUS | Status: AC | PRN
  Administered 2017-05-14 (×3): 10 meq via INTRAVENOUS
  Filled 2017-05-14 (×3): qty 50

## 2017-05-14 MED ORDER — PANTOPRAZOLE SODIUM 40 MG PO TBEC
40.0000 mg | DELAYED_RELEASE_TABLET | Freq: Every day | ORAL | Status: DC
  Administered 2017-05-14 – 2017-05-23 (×10): 40 mg via ORAL
  Filled 2017-05-14 (×10): qty 1

## 2017-05-14 MED ORDER — ENSURE ENLIVE PO LIQD
237.0000 mL | Freq: Two times a day (BID) | ORAL | Status: DC
  Administered 2017-05-14 – 2017-05-20 (×5): 237 mL via ORAL

## 2017-05-14 MED ORDER — OXYCODONE HCL 5 MG PO TABS: 7.5000 mg | ORAL_TABLET | ORAL | Status: DC | PRN

## 2017-05-14 MED ORDER — WARFARIN SODIUM 2.5 MG PO TABS
2.5000 mg | ORAL_TABLET | Freq: Every day | ORAL | Status: DC
  Administered 2017-05-14: 2.5 mg via ORAL
  Filled 2017-05-14: qty 1

## 2017-05-14 MED ORDER — ORAL CARE MOUTH RINSE
15.0000 mL | Freq: Two times a day (BID) | OROMUCOSAL | Status: DC
  Administered 2017-05-14 – 2017-05-19 (×8): 15 mL via OROMUCOSAL

## 2017-05-14 MED ORDER — DOCUSATE SODIUM 100 MG PO CAPS
200.0000 mg | ORAL_CAPSULE | Freq: Every day | ORAL | Status: DC
Start: 2017-05-14 — End: 2017-05-15
  Administered 2017-05-14 – 2017-05-15 (×2): 200 mg via ORAL
  Filled 2017-05-14 (×2): qty 2

## 2017-05-14 MED ORDER — RESOURCE THICKENUP CLEAR PO POWD
Freq: Once | ORAL | Status: AC
  Administered 2017-05-14: 14:00:00 via ORAL
  Filled 2017-05-14: qty 125

## 2017-05-14 MED ORDER — DIPHENHYDRAMINE HCL 12.5 MG/5ML PO ELIX: 12.5000 mg | ORAL_SOLUTION | Freq: Every evening | ORAL | Status: DC | PRN

## 2017-05-14 NOTE — Progress Notes (Signed)
Patient ID: Jack Joseph, male   DOB: 24-Jun-1946, 71 y.o.   MRN: [id number] [facility] DAILY ICU PROGRESS NOTE                   [address]            [employer] [address]          [phone]   15 Days Post-Op Procedure(s) (LRB): REDO STERNOTOMY (N/A) REPLACEMENT ASCENDING AORTA/ WHEAT PROCEDURE, HYPOTHERMIC CIRCULATORY ARREST AND CARDIOPULMONARY BYPASS, AND USE OF HEMASHIELD PLATINUM WOVEN DOUBLE VELOUR VASCULAR GRAFT 32 MM X 50 CM (N/A) TRANSESOPHAGEAL ECHOCARDIOGRAM (TEE) (N/A)  Total Length of Stay:  LOS: 15 days   Subjective: Awake and alert , mildly confused   Objective: Vital signs in last 24 hours: Temp:  [97.3 F (36.3 C)-98.7 F (37.1 C)] 98.2 F (36.8 C) (07/03 0700) Pulse Rate:  [69-103] 85 (07/03 0700) Cardiac Rhythm: Normal sinus rhythm;Bundle branch block (07/03 0400) Resp:  [19-37] 26 (07/03 0700) BP: (103-123)/(56-95) 117/63 (07/03 0700) SpO2:  [92 %-100 %] 100 % (07/03 0700) Weight:  [149 lb 4 oz (67.7 kg)] 149 lb 4 oz (67.7 kg) (07/03 0500)  Filed Weights   05/12/17 0500 05/13/17 0500 05/14/17 0500  Weight: 152 lb 1.9 oz (69 kg) 149 lb 0.5 oz (67.6 kg) 149 lb 4 oz (67.7 kg)    Weight change: 3.5 oz (0.1 kg)   Hemodynamic parameters for last 24 hours: CVP:  [5 mmHg-8 mmHg] 7 mmHg  Intake/Output from previous day: 07/02 0701 - 07/03 0700 In: 2287 [I.V.:792; NG/GT:1370; IV Piggyback:125] Out: 9767 [Urine:1485; Chest Tube:260]  Intake/Output this shift: No intake/output data recorded.  Current Meds: Scheduled Meds: . amiodarone  200 mg Oral BID  . bisacodyl  10 mg Oral Daily   Or  . bisacodyl  10 mg Rectal Daily  . chlorhexidine gluconate (MEDLINE KIT)  15 mL Mouth Rinse BID  . Chlorhexidine Gluconate Cloth  6 each Topical Daily  . docusate  200 mg Oral Daily  . feeding supplement (PRO-STAT SUGAR FREE 64)  30 mL Per Tube BID  . insulin aspart  0-24 Units Subcutaneous Q4H  . levalbuterol  0.63 mg Nebulization TID  . magic  mouthwash  5 mL Oral TID  . mouth rinse  15 mL Mouth Rinse 10 times per day  . pantoprazole sodium  40 mg Per Tube Daily  . sodium chloride flush  10-40 mL Intracatheter Q12H  . sodium chloride flush  3 mL Intravenous Q12H  . Warfarin - Physician Dosing Inpatient   Does not apply q1800   Continuous Infusions: . sodium chloride 20 mL/hr at 05/14/17 0400  . feeding supplement (VITAL 1.5 CAL) Stopped (05/14/17 0700)  . heparin 2,050 Units/hr (05/14/17 0400)  . norepinephrine (LEVOPHED) Adult infusion Stopped (05/12/17 1745)  . piperacillin-tazobactam (ZOSYN)  IV 3.375 g (05/14/17 0600)   PRN Meds:.acetaminophen (TYLENOL) oral liquid 160 mg/5 mL, diphenhydrAMINE, guaiFENesin, levalbuterol, ondansetron (ZOFRAN) IV, oxyCODONE, sodium chloride flush, sodium chloride flush  General appearance: alert and cooperative Neurologic: intact Heart: regular rate and rhythm, S1, S2 normal, no murmur, click, rub or gallop Lungs: diminished breath sounds bibasilar Abdomen: soft, non-tender; bowel sounds normal; no masses,  no organomegaly Extremities: extremities normal, atraumatic, no cyanosis or edema and Homans sign is negative, no sign of DVT Wound: sternum intact  Lab Results: CBC: Recent Labs  05/12/17 0432  05/13/17 0415 05/13/17 1536  WBC 14.9*  --  14.7*  --   HGB 8.1*  < >  9.1* 8.8*  HCT 25.5*  < > 29.0* 26.0*  PLT 648*  --  707*  --   < > = values in this interval not displayed. BMET:  Recent Labs  05/12/17 0432  05/13/17 0415 05/13/17 1536  NA 137  < > 139 141  K 4.0  < > 4.1 3.8  CL 102  < > 106 104  CO2 27  --  27  --   GLUCOSE 144*  < > 129* 147*  BUN 26*  < > 22* 22*  CREATININE 0.90  < > 0.71 0.60*  CALCIUM 8.0*  --  8.2*  --   < > = values in this interval not displayed.  CMET: Lab Results  Component Value Date   WBC 14.7 (H) 05/13/2017   HGB 8.8 (L) 05/13/2017   HCT 26.0 (L) 05/13/2017   PLT 707 (H) 05/13/2017   GLUCOSE 147 (H) 05/13/2017   CHOL 199  01/22/2017   TRIG 71 01/22/2017   HDL 64 01/22/2017   LDLCALC 121 (H) 01/22/2017   ALT 153 (H) 05/09/2017   AST 156 (H) 05/09/2017   NA 141 05/13/2017   K 3.8 05/13/2017   CL 104 05/13/2017   CREATININE 0.60 (L) 05/13/2017   BUN 22 (H) 05/13/2017   CO2 27 05/13/2017   TSH 4.065 05/09/2017   INR 1.39 05/14/2017   HGBA1C 5.7 (H) 04/25/2017      PT/INR:  Recent Labs  05/14/17 0426  LABPROT 17.2*  INR 1.39   Radiology: No results found.   Assessment/Plan: S/P Procedure(s) (LRB): REDO STERNOTOMY (N/A) REPLACEMENT ASCENDING AORTA/ WHEAT PROCEDURE, HYPOTHERMIC CIRCULATORY ARREST AND CARDIOPULMONARY BYPASS, AND USE OF HEMASHIELD PLATINUM WOVEN DOUBLE VELOUR VASCULAR GRAFT 32 MM X 50 CM (N/A) TRANSESOPHAGEAL ECHOCARDIOGRAM (TEE) (N/A) Mobilize Diuresis Diabetes control Elevation of lfts from 6/23 , ? Cordarone related  inr 1.39 on coumadin and heparin     [name] 05/14/2017 7:43 AM

## 2017-05-14 NOTE — Progress Notes (Signed)
Modified Barium Swallow Progress Note  Patient Details  Name: Jack Joseph MRN: [id number] Date of Birth: 1946-09-02  Today's Date: 05/14/2017  Modified Barium Swallow completed.  Full report located under Chart Review in the Imaging Section.  Brief recommendations include the following:  Clinical Impression  Pt presents with remarkably strong swallow function despite four intubations and deconditioning.   There is adequate mastication; timely swallow initiation; good mobility of hyolaryngeal complex, leading to adequate airway closure for most consistencies and strong pharyngeal clearance (no residue post-swallow).  Impairments were limited to decreased laryngeal closure during consumption of thin liquids, leading to trace silent aspiration.  A chin tuck adequately protected airway, but due to pt's fluctuating MS, he was not able to consistently execute chin tuck.  For now, recommend initiating a dysphagia 3 diet with nectar-thick liquids; meds whole in puree.  Discussed results and recs with pt/wife, who verbalize understanding.  SLP will follow for safety and diet progression.    Swallow Evaluation Recommendations       SLP Diet Recommendations: Dysphagia 3 (Mech soft) solids;Nectar thick liquid   Liquid Administration via: Cup   Medication Administration: Whole meds with puree   Supervision: Patient able to self feed   Compensations: Slow rate;Small sips/bites;Minimize environmental distractions   Postural Changes: Seated upright at 90 degrees   Oral Care Recommendations: Oral care BID   Other Recommendations: Order thickener from pharmacy   Anjelita Sheahan L. Samson Frederic, [place] CCC/SLP Pager [phone]  [name] [name] 05/14/2017,12:55 PM

## 2017-05-14 NOTE — Progress Notes (Signed)
ANTICOAGULATION CONSULT NOTE Pharmacy Consult for Heparin Indication: mechanical AVR  Allergies  Allergen Reactions  . No Known Allergies     Patient Measurements: Height: 5\' 10"  (177.8 cm) Weight: 038 lb 4 oz (67.7 kg) IBW/kg (Calculated) : 73   Vital Signs: Temp: 98.2 F (36.8 C) (07/03 0700) Temp Source: Axillary (07/03 0700) BP: 125/58 (07/03 0900) Pulse Rate: 90 (07/03 0900)  Labs:  Recent Labs  05/12/17 0432 05/12/17 0433 05/12/17 1622 05/13/17 0415 05/13/17 1536 05/14/17 0426 05/14/17 0843  HGB 8.1*  --  8.8* 9.1* 8.8*  --  9.0*  HCT 25.5*  --  26.0* 29.0* 26.0*  --  29.2*  PLT 648*  --   --  707*  --   --  767*  LABPROT 16.5*  --   --  16.2*  --  17.2*  --   INR 1.32  --   --  1.29  --  1.39  --   HEPARINUNFRC  --  0.27*  --  0.45  --  0.40  --   CREATININE 0.90  --  0.70 0.71 0.60*  --   --     Estimated Creatinine Clearance: 79.9 mL/min (A) (by C-G formula based on SCr of 0.6 mg/dL (L)).  . sodium chloride 20 mL/hr at 05/14/17 0900  . feeding supplement (VITAL 1.5 CAL) Stopped (05/14/17 0700)  . heparin 2,050 Units/hr (05/14/17 0900)  . norepinephrine (LEVOPHED) Adult infusion Stopped (05/12/17 1745)  . piperacillin-tazobactam (ZOSYN)  IV 3.375 g (05/14/17 0600)   . sodium chloride 20 mL/hr at 05/14/17 0900  . feeding supplement (VITAL 1.5 CAL) Stopped (05/14/17 0700)  . heparin 2,050 Units/hr (05/14/17 0900)  . norepinephrine (LEVOPHED) Adult infusion Stopped (05/12/17 1745)  . piperacillin-tazobactam (ZOSYN)  IV 3.375 g (05/14/17 0600)     Assessment: 71 year old male with h/o CHF and mechanical AVR s/p ascending aorta replacement 04/29/17 on IV heparin while INR is subtherapeutic. Coumadin resumed 7/1.  Heparin level at goal.  Hgb fairly stable.  Pltc rising.  No further nosebleeds overnight.  Patient reports issues with frequent nosebleeds for past several years.  Goal of Therapy:  Heparin level = 0.3 - 0.5 units / ml Monitor platelets by  anticoagulation protocol: Yes   Plan:  Continue IV Heparin at current rate of 2050 units/hr. Daily heparin level and CBC. Coumadin resumed - dosing per physician.  Tad Moore, BCPS  Clinical Pharmacist Pager [phone]  05/14/2017 10:30 AM

## 2017-05-14 NOTE — Progress Notes (Signed)
Patient ID: Jack Joseph, male   DOB: August 21, 1946, 71 y.o.   MRN: [id number]  SICU Evening Rounds:  Hemodynamically stable Urine output ok One chest tube remains with no air leak. Diet rec made by ST. Ate some today.  BMET    Component Value Date/Time   NA 142 05/14/2017 1521   NA 135 02/27/2017 1637   K 3.7 05/14/2017 1521   CL 104 05/14/2017 1521   CO2 27 05/13/2017 0415   GLUCOSE 124 (H) 05/14/2017 1521   BUN 20 05/14/2017 1521   BUN 15 02/27/2017 1637   CREATININE 0.70 05/14/2017 1521   CALCIUM 8.2 (L) 05/13/2017 0415   GFRNONAA >60 05/13/2017 0415   GFRAA >60 05/13/2017 0415   CBC    Component Value Date/Time   WBC 16.6 (H) 05/14/2017 0843   RBC 3.08 (L) 05/14/2017 0843   HGB 8.8 (L) 05/14/2017 1521   HGB 13.5 02/27/2017 1636   HCT 26.0 (L) 05/14/2017 1521   HCT 38.7 02/27/2017 1636   PLT 767 (H) 05/14/2017 0843   PLT 350 02/27/2017 1636   MCV 94.8 05/14/2017 0843   MCV 87 02/27/2017 1636   MCH 29.2 05/14/2017 0843   MCHC 30.8 05/14/2017 0843   RDW 15.3 05/14/2017 0843   RDW 14.3 02/27/2017 1636   LYMPHSABS 1.5 02/27/2017 1636   EOSABS 0.1 02/27/2017 1636   BASOSABS 0.1 02/27/2017 1636

## 2017-05-14 NOTE — Progress Notes (Signed)
PULMONARY / CRITICAL CARE MEDICINE   Name: Jack Joseph MRN: [id number] DOB: 04-Aug-1946    ADMISSION DATE:  04/29/2017  CONSULTATION DATE:  6/20  REFERRING MD:  Tyrone Sage (CVTS)  CHIEF COMPLAINT:  Post arrest/ vent management   HISTORY OF PRESENT ILLNESS:   71yo male former smoker with hx remote AVR 1983 on chronic coumadin, COPD (FEV1 1.14 (35%), FEV1/FVC 47), LV dysfunction with EF 30-35%, dilated ascending aorta who initially presented 6/18 for elective surgical repair.  Underwent ascending aortic replacement/wheat procedure with CABG.  Was progressing well post op and ambulating in hall.  However just after midnight 6/20 had VT arrest with CPR and shock x 3.  Intubated during ACLS, small R ptx noted post CPR and CT placed by CVTS.  PCCM consulted for vent management.   SUBJECTIVE:  No acute events.  Asked to see back due to bump in LFT's (from 6/28: AST 156, ALT 153, Tbili 1.3)   VITAL SIGNS: BP (!) 99/48   Pulse 90   Temp 98.2 F (36.8 C) (Axillary)   Resp (!) 24   Ht 5\' 10"  (1.778 m)   Wt 67.7 kg (149 lb 4 oz)   SpO2 100%   BMI 21.42 kg/m   HEMODYNAMICS: CVP:  [5 mmHg-8 mmHg] 7 mmHg  VENTILATOR SETTINGS:    INTAKE / OUTPUT: I/O last 3 completed shifts: In: 3353 [I.V.:1158; NG/GT:1970; IV Piggyback:225] Out: 2425 [Urine:2095; Chest Tube:330]  PHYSICAL EXAMINATION: Gen:      Alert and interactive, NAD, wife at bedside. HEENT:  EOMI, sclera anicteric Neck:      -JVD, supple Lungs:    CTA bilaterally CV:         Regular rate and rhythm; no murmurs Abd:      + bowel sounds; soft, non-tender; no palpable masses, no distension Ext:    No edema; adequate peripheral perfusion, crepitus over arms and chest Skin:       Warm and dry; no rash Neuro:    Awake and interactive, moving all ext to command  LABS:  BMET  Recent Labs Lab 05/11/17 0343  05/12/17 0432 05/12/17 1622 05/13/17 0415 05/13/17 1536  NA 137  < > 137 141 139 141  K 3.8  < > 4.0 4.1 4.1  3.8  CL 101  < > 102 102 106 104  CO2 29  --  27  --  27  --   BUN 23*  < > 26* 25* 22* 22*  CREATININE 0.81  < > 0.90 0.70 0.71 0.60*  GLUCOSE 150*  < > 144* 101* 129* 147*  < > = values in this interval not displayed.  Electrolytes  Recent Labs Lab 05/08/17 0450  05/11/17 0343 05/12/17 0432 05/13/17 0415  CALCIUM 7.8*  < > 8.1* 8.0* 8.2*  MG 1.9  --   --  2.3  --   PHOS  --   --   --  4.0  --   < > = values in this interval not displayed.  CBC  Recent Labs Lab 05/10/17 0328  05/12/17 0432 05/12/17 1622 05/13/17 0415 05/13/17 1536  WBC 17.7*  --  14.9*  --  14.7*  --   HGB 7.4*  < > 8.1* 8.8* 9.1* 8.8*  HCT 23.4*  < > 25.5* 26.0* 29.0* 26.0*  PLT 467*  --  648*  --  707*  --   < > = values in this interval not displayed.  Coag's  Recent Labs Lab 05/12/17 [id number] 05/13/17  0415 05/14/17 0426  INR 1.32 1.29 1.39   Sepsis Markers  Recent Labs Lab 05/10/17 1158 05/11/17 0343 05/12/17 0432  PROCALCITON 0.32 0.27 0.25   ABG  Recent Labs Lab 05/12/17 0225 05/13/17 0425 05/14/17 0320  PHART 7.419 7.402 7.431  PCO2ART 42.3 43.4 41.0  PO2ART 76.6* 54.8* 69.3*   Liver Enzymes  Recent Labs Lab 05/09/17 0655  AST 156*  ALT 153*  ALKPHOS 115  BILITOT 1.3*  ALBUMIN 1.9*   Cardiac Enzymes No results for input(s): TROPONINI, PROBNP in the last 168 hours.  Glucose  Recent Labs Lab 05/13/17 1144 05/13/17 1507 05/13/17 1926 05/13/17 2352 05/14/17 0413 05/14/17 0730  GLUCAP 119* 115* 117* 133* 135* 115*   Imaging CXR 6/26> Left lung opacification, extensive SQ emphysema. Small right pneumothorax I have reviewed the images personally  STUDIES:  2D echo 6/20 > EF 10-15%, G1DD, mod MR, mild LA and RA dilation, severe reduction in RV function, mild-mod TR, left pleural effusion.  CULTURES: Sputum 6/24 >>> Normal resp flora Bronch washings 6/26 >> no growth  ANTIBIOTICS: Vanc 6/24 > 6/28 Zosyn 6/24 > stop date 7/3  SIGNIFICANT  EVENTS: 6/21 Extubated, reintubated 6/24 Extubated 6/26 Bronch, reintubated 7/1 Extubated  LINES/TUBES: ETT 6/20>>> 6/21; 6/21 >> 6/24; 6/26>> 7/1 R chest tube #1 6/20>>> R chest tube #2 6/21 >>   ASSESSMENT / PLAN: 71yo male with VT arrest POD#9 thoracic aortic aneurysm repair.  Suffered VT arrest, recurrent resp failure secondary to secretions, rt pneumothorax and LLL HCAP.  Acute respiratory failure - post VT arrest.  Extubated 7/1 HCAP Hx COPD  Right pneumothorax (after CPR) status post chest tube 2 Severe left-sided atelectasis with mucus plugging Plan: Continue nebs, pulmonary toilete CT management per CVTS  Possible HCAP Plan Continue zosyn day 10/10 (stop date today 7/3)  VT arrest 6/20 - unclear etiology.  Acute on chronic CHF - EF 10-15%; Shock - cardiogenic.   Repair thoracic aortic aneurysm 6/18 Hx AVR  Plan Heparin anticoagulation (heparin and currently transitioning to warfarin), amiodarone Consider d/c amio if LFT's continue to rise  Increased LFT's - ? amio toxicity. Plan: Recheck LFT's (last checked 6/28)  Protein calorie malnutrition.  Had swallow eval 7/2, felt he needs MBS (planned for today 7/3). Plan Continue tube feeds NPO until MBS completed  Hyperglycemia   Plan:   SSI coverage  ICU delirium - resolved Plan:   No further interventions required  FAMILY  - Updates:  Wife updated 7/3.   Rutherford Guys, [place] [facility] Pager: [phone]  or [phone] 05/14/2017, 8:37 AM  Attending Note:  71 year old male s/p thoracic aneurysm repair and PTX requiring chest tubes that were removed.  I reviewed CXR myself, no acute disease noted.  On exam, lungs are clear.  Discussed with PCCM-NP.  PCCM called back for elevated liver enzymes.  Acute respiratory failure:             - Monitor for airway protection             - Ambulate  Hypoxemia:             - Titrate O2 for sat of  88-92%  ICU delirium             - Minimize sedation             - Ambulate             -  PT  Hyperglycemia:             - ISS             - CBG  Elevated LFTs, concern for amiodarone vs shocked liver.  - LFT today  - If increasing will stop amiodarone, if dropping will continue  PCCM will see again.  Patient seen and examined, agree with above note.  I dictated the care and orders written for this patient under my direction.  Alyson Reedy, MD [phone]

## 2017-05-14 NOTE — Plan of Care (Signed)
Problem: Cardiac: Goal: Hemodynamic stability will improve Outcome: Progressing Verbalizes understanding  Problem: Respiratory: Goal: Respiratory status will improve Outcome: Progressing Verbalizes understanding

## 2017-05-15 ENCOUNTER — Inpatient Hospital Stay (HOSPITAL_COMMUNITY): Payer: Medicare Other

## 2017-05-15 LAB — COMPREHENSIVE METABOLIC PANEL
ALT: 67 U/L — ABNORMAL HIGH (ref 17–63)
AST: 28 U/L (ref 15–41)
Albumin: 1.9 g/dL — ABNORMAL LOW (ref 3.5–5.0)
Alkaline Phosphatase: 140 U/L — ABNORMAL HIGH (ref 38–126)
Anion gap: 8 (ref 5–15)
BUN: 16 mg/dL (ref 6–20)
CO2: 26 mmol/L (ref 22–32)
Calcium: 8.3 mg/dL — ABNORMAL LOW (ref 8.9–10.3)
Chloride: 108 mmol/L (ref 101–111)
Creatinine, Ser: 0.71 mg/dL (ref 0.61–1.24)
GFR calc Af Amer: >60 mL/min (ref >60)
GFR calc non Af Amer: >60 mL/min (ref >60)
Glucose, Bld: 99 mg/dL (ref 65–99)
Potassium: 3.9 mmol/L (ref 3.5–5.1)
Sodium: 142 mmol/L (ref 135–145)
Total Bilirubin: 0.7 mg/dL (ref 0.3–1.2)
Total Protein: 5.4 g/dL — ABNORMAL LOW (ref 6.5–8.1)

## 2017-05-15 LAB — HEPARIN LEVEL (UNFRACTIONATED): Heparin Unfractionated: 0.59 IU/mL (ref 0.30–0.70)

## 2017-05-15 LAB — MAGNESIUM: Magnesium: 2.1 mg/dL (ref 1.7–2.4)

## 2017-05-15 LAB — POCT I-STAT, CHEM 8
BUN: 20 mg/dL (ref 6–20)
Calcium, Ion: 1.2 mmol/L (ref 1.15–1.40)
Chloride: 105 mmol/L (ref 101–111)
Creatinine, Ser: 0.7 mg/dL (ref 0.61–1.24)
Glucose, Bld: 96 mg/dL (ref 65–99)
HCT: 27 % — ABNORMAL LOW (ref 39.0–52.0)
Hemoglobin: 9.2 g/dL — ABNORMAL LOW (ref 13.0–17.0)
Potassium: 3.8 mmol/L (ref 3.5–5.1)
Sodium: 141 mmol/L (ref 135–145)
TCO2: 26 mmol/L (ref 0–100)

## 2017-05-15 LAB — GLUCOSE, CAPILLARY
Glucose-Capillary: 86 mg/dL (ref 65–99)
Glucose-Capillary: 93 mg/dL (ref 65–99)

## 2017-05-15 LAB — CBC
HCT: 27.6 % — ABNORMAL LOW (ref 39.0–52.0)
Hemoglobin: 8.4 g/dL — ABNORMAL LOW (ref 13.0–17.0)
MCH: 29.3 pg (ref 26.0–34.0)
MCHC: 30.4 g/dL (ref 30.0–36.0)
MCV: 96.2 fL (ref 78.0–100.0)
Platelets: 818 10*3/uL — ABNORMAL HIGH (ref 150–400)
RBC: 2.87 MIL/uL — ABNORMAL LOW (ref 4.22–5.81)
RDW: 15.4 % (ref 11.5–15.5)
WBC: 13.4 10*3/uL — ABNORMAL HIGH (ref 4.0–10.5)

## 2017-05-15 LAB — PROTIME-INR
INR: 1.49
Prothrombin Time: 18.2 seconds — ABNORMAL HIGH (ref 11.4–15.2)

## 2017-05-15 MED ORDER — WARFARIN SODIUM 2 MG PO TABS
4.0000 mg | ORAL_TABLET | Freq: Every day | ORAL | Status: DC
  Administered 2017-05-15 – 2017-05-16 (×2): 4 mg via ORAL
  Filled 2017-05-15 (×2): qty 2

## 2017-05-15 MED ORDER — FUROSEMIDE 10 MG/ML IJ SOLN
40.0000 mg | Freq: Once | INTRAMUSCULAR | Status: AC
  Administered 2017-05-15: 40 mg via INTRAVENOUS
  Filled 2017-05-15: qty 4

## 2017-05-15 NOTE — Progress Notes (Signed)
Patient ID: Jack Joseph, male   DOB: 09-Mar-1946, 71 y.o.   MRN: [id number] T[facility] DAILY ICU PROGRESS NOTE                   [address]            [place] [address]          [phone]   16 Days Post-Op Procedure(s) (LRB): REDO STERNOTOMY (N/A) REPLACEMENT ASCENDING AORTA/ WHEAT PROCEDURE, HYPOTHERMIC CIRCULATORY ARREST AND CARDIOPULMONARY BYPASS, AND USE OF HEMASHIELD PLATINUM WOVEN DOUBLE VELOUR VASCULAR GRAFT 32 MM X 50 CM (N/A) TRANSESOPHAGEAL ECHOCARDIOGRAM (TEE) (N/A)  Total Length of Stay:  LOS: 16 days   Subjective: Confused , but cooperative and knows he is at [facility]   Objective: Vital signs in last 24 hours: Temp:  [97.8 F (36.6 C)-98.7 F (37.1 C)] 98.2 F (36.8 C) (07/04 0730) Pulse Rate:  [50-127] 73 (07/04 0600) Cardiac Rhythm: Normal sinus rhythm;Bundle branch block (07/04 0400) Resp:  [20-30] 22 (07/04 0600) BP: (94-123)/(56-74) 123/74 (07/04 0600) SpO2:  [74 %-100 %] 98 % (07/04 0753) Weight:  [155 lb 3.3 oz (70.4 kg)] 155 lb 3.3 oz (70.4 kg) (07/04 0500)  Filed Weights   05/13/17 0500 05/14/17 0500 05/15/17 0500  Weight: 149 lb 0.5 oz (67.6 kg) 149 lb 4 oz (67.7 kg) 155 lb 3.3 oz (70.4 kg)    Weight change: 5 lb 15.2 oz (2.7 kg)   Hemodynamic parameters for last 24 hours: CVP:  [9 mmHg-10 mmHg] 10 mmHg  Intake/Output from previous day: 07/03 0701 - 07/04 0700 In: 1811.5 [P.O.:660; I.V.:951.5; IV Piggyback:200] Out: 770 [Urine:770]  Intake/Output this shift: No intake/output data recorded.  Current Meds: Scheduled Meds: . amiodarone  200 mg Oral BID  . bisacodyl  10 mg Oral Daily   Or  . bisacodyl  10 mg Rectal Daily  . chlorhexidine gluconate (MEDLINE KIT)  15 mL Mouth Rinse BID  . Chlorhexidine Gluconate Cloth  6 each Topical Daily  . docusate sodium  200 mg Oral Daily  . feeding supplement (ENSURE ENLIVE)  237 mL Oral BID BM  . levalbuterol  0.63 mg Nebulization TID  . magic mouthwash  5 mL Oral TID  . mouth rinse   15 mL Mouth Rinse BID  . pantoprazole  40 mg Oral Daily  . sodium chloride flush  10-40 mL Intracatheter Q12H  . sodium chloride flush  3 mL Intravenous Q12H  . warfarin  2.5 mg Oral q1800  . Warfarin - Physician Dosing Inpatient   Does not apply q1800   Continuous Infusions: . sodium chloride 20 mL/hr at 05/14/17 1900  . heparin 2,050 Units/hr (05/14/17 1900)  . norepinephrine (LEVOPHED) Adult infusion Stopped (05/12/17 1745)   PRN Meds:.diphenhydrAMINE, guaiFENesin, levalbuterol, ondansetron (ZOFRAN) IV, oxyCODONE, sodium chloride flush, sodium chloride flush  General appearance: alert and cooperative Neurologic: intact Heart: regular rate and rhythm, S1, S2 normal, no murmur, click, rub or gallop Lungs: diminished breath sounds bibasilar Abdomen: soft, non-tender; bowel sounds normal; no masses,  no organomegaly Extremities: extremities normal, atraumatic, no cyanosis or edema and Homans sign is negative, no sign of DVT Wound: sternum intact  Lab Results: CBC: Recent Labs  05/14/17 0843 05/14/17 1521 05/15/17 0357  WBC 16.6*  --  13.4*  HGB 9.0* 8.8* 8.4*  HCT 29.2* 26.0* 27.6*  PLT 767*  --  818*   BMET:  Recent Labs  05/13/17 0415  05/14/17 1521 05/15/17 0357  NA 139  < >  142 142  K 4.1  < > 3.7 3.9  CL 106  < > 104 108  CO2 27  --   --  26  GLUCOSE 129*  < > 124* 99  BUN 22*  < > 20 16  CREATININE 0.71  < > 0.70 0.71  CALCIUM 8.2*  --   --  8.3*  < > = values in this interval not displayed.  CMET: Lab Results  Component Value Date   WBC 13.4 (H) 05/15/2017   HGB 8.4 (L) 05/15/2017   HCT 27.6 (L) 05/15/2017   PLT 818 (H) 05/15/2017   GLUCOSE 99 05/15/2017   CHOL 199 01/22/2017   TRIG 71 01/22/2017   HDL 64 01/22/2017   LDLCALC 121 (H) 01/22/2017   ALT 67 (H) 05/15/2017   AST 28 05/15/2017   NA 142 05/15/2017   K 3.9 05/15/2017   CL 108 05/15/2017   CREATININE 0.71 05/15/2017   BUN 16 05/15/2017   CO2 26 05/15/2017   TSH 4.065 05/09/2017    INR 1.49 05/15/2017   HGBA1C 5.7 (H) 04/25/2017      PT/INR:  Recent Labs  05/15/17 0357  LABPROT 18.2*  INR 1.49   Radiology: Dg Chest Port 1 View  Result Date: 05/15/2017 CLINICAL DATA:  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 Right chest tube in place. EXAM: PORTABLE CHEST 1 VIEW COMPARISON:  Single-view of the chest 05/14/2017 and 05/13/2017. FINDINGS: Small bore right chest tube remains in place. There is a tiny right apical pneumothorax, unchanged. Subcutaneous emphysema is seen over the chest. Left pleural effusion appears slightly decreased compared to yesterday's examination. Left basilar airspace disease is unchanged. Prosthetic aortic valve and intact median sternotomy wires are unchanged. IMPRESSION: Tiny right apical pneumothorax with a chest tube in place, unchanged. Slight decrease in a left pleural effusion. Left basilar airspace disease unchanged. Electronically Signed   By: Inge Rise M.D.   On: 05/15/2017 08:45    Assessment/Plan: S/P Procedure(s) (LRB): REDO STERNOTOMY (N/A) REPLACEMENT ASCENDING AORTA/ WHEAT PROCEDURE, HYPOTHERMIC CIRCULATORY ARREST AND CARDIOPULMONARY BYPASS, AND USE OF HEMASHIELD PLATINUM WOVEN DOUBLE VELOUR VASCULAR GRAFT 32 MM X 50 CM (N/A) TRANSESOPHAGEAL ECHOCARDIOGRAM (TEE) (N/A) Mobilize inr still only 1.48 on coumadin , d/c heparin when therapeutic  Lfts, now decreasing    [name] 05/15/2017 9:37 AM

## 2017-05-15 NOTE — Progress Notes (Signed)
Notified Dr. Tyrone Sage of PM CXR results (unchanged from previous study) and pt's non-stop diarrhea. Received orders for rectal tube placement. No orders for C.diff testing at this time d/t pt's recent swallow study and lack of s/sx of infection at this time. Will continue to monitor.  Peyton Bottoms, RN 05/15/17

## 2017-05-15 NOTE — Care Management Note (Signed)
Case Management Note Donn Pierini RN, BSN Unit 2W-Case Manager-- 2H coverage [phone]  Patient Details  Name: Jack Joseph MRN: [id number] Date of Birth: 1946/06/10  Subjective/Objective:  Pt admitted s/p REDO STERNOTOMY (N/A) REPLACEMENT ASCENDING AORTA/ WHEAT PROCEDURE on 04/29/17               Action/Plan: PTA pt lived at home with wife- CM to follow for d/c needs  Expected Discharge Date:                  Expected Discharge Plan:  IP Rehab Facility  In-House Referral:  Clinical Social Work  Discharge planning Services  CM Consult  Post Acute Care Choice:  Durable Medical Equipment Choice offered to:     DME Arranged:  Life vest DME Agency:  [facility]  HH Arranged:    HH Agency:     Status of Service:  In process, will continue to follow  If discussed at Long Length of Stay Meetings, dates discussed:  7/3  Discharge Disposition:   Additional Comments:  05/15/17- 1220- Donn Pierini RN, CM- noted recommendations for CIR- please place consult for CIR to eval for possible admission- also noted cards note regarding Life Vest- pt will need order submitted for Life vest for approval prior to discharge.- have alerted Rosanne Ashing with [employer] for possible Life Vest need.   Darrold Span, RN 05/15/2017, 12:27 PM

## 2017-05-15 NOTE — Progress Notes (Signed)
Patient ID: Jack Joseph, male   DOB: [birth year], 71 y.o.   MRN: [id number] EVENING ROUNDS NOTE :     [address]       [place] [id number]             [phone]                 16 Days Post-Op Procedure(s) (LRB): REDO STERNOTOMY (N/A) REPLACEMENT ASCENDING AORTA/ WHEAT PROCEDURE, HYPOTHERMIC CIRCULATORY ARREST AND CARDIOPULMONARY BYPASS, AND USE OF HEMASHIELD PLATINUM WOVEN DOUBLE VELOUR VASCULAR GRAFT 32 MM X 50 CM (N/A) TRANSESOPHAGEAL ECHOCARDIOGRAM (TEE) (N/A)  Total Length of Stay:  LOS: 16 days  BP 107/60   Pulse 95   Temp 98 F (36.7 C) (Axillary)   Resp (!) 23   Ht 5\' 10"  (1.778 m)   Wt 155 lb 3.3 oz (70.4 kg)   SpO2 94%   BMI 22.27 kg/m   .Intake/Output      07/04 0701 - 07/05 0700   P.O. 50   I.V. (mL/kg) 243.2 (3.5)   IV Piggyback    Total Intake(mL/kg) 293.2 (4.2)   Urine (mL/kg/hr) 425 (0.5)   Stool    Chest Tube    Total Output 425   Net -131.8       Urine Occurrence 1 x     . sodium chloride 20 mL/hr at 05/14/17 1900  . heparin 2,000 Units/hr (05/15/17 1420)  . norepinephrine (LEVOPHED) Adult infusion Stopped (05/12/17 1745)     Lab Results  Component Value Date   WBC 13.4 (H) 05/15/2017   HGB 9.2 (L) 05/15/2017   HCT 27.0 (L) 05/15/2017   PLT 818 (H) 05/15/2017   GLUCOSE 96 05/15/2017   CHOL 199 01/22/2017   TRIG 71 01/22/2017   HDL 64 01/22/2017   LDLCALC 121 (H) 01/22/2017   ALT 67 (H) 05/15/2017   AST 28 05/15/2017   NA 141 05/15/2017   K 3.8 05/15/2017   CL 105 05/15/2017   CREATININE 0.70 05/15/2017   BUN 20 05/15/2017   CO2 26 05/15/2017   TSH 4.065 05/09/2017   INR 1.49 05/15/2017   HGBA1C 5.7 (H) 04/25/2017   Rhythm stable Walked with help today Now tired Check chest xray after ct out Wbc decreasing   [name] MD  Beeper [phone] Office [phone] 05/15/2017 7:21 PM

## 2017-05-15 NOTE — Progress Notes (Signed)
ANTICOAGULATION CONSULT NOTE Pharmacy Consult for Heparin /  Warfarin Indication: mechanical AVR  Allergies  Allergen Reactions  . No Known Allergies     Patient Measurements: Height: 5\' 10"  (177.8 cm) Weight: 155 lb 3.3 oz (70.4 kg) IBW/kg (Calculated) : 73   Vital Signs: Temp: 97.2 F (36.2 C) (07/04 1217) Temp Source: Axillary (07/04 1217) BP: 110/74 (07/04 0900) Pulse Rate: 103 (07/04 1000)  Labs:  Recent Labs  05/13/17 0415 05/13/17 1536 05/14/17 0426 05/14/17 0843 05/14/17 1521 05/15/17 0357 05/15/17 1300  HGB 9.1* 8.8*  --  9.0* 8.8* 8.4*  --   HCT 29.0* 26.0*  --  29.2* 26.0* 27.6*  --   PLT 707*  --   --  767*  --  818*  --   LABPROT 16.2*  --  17.2*  --   --  18.2*  --   INR 1.29  --  1.39  --   --  1.49  --   HEPARINUNFRC 0.45  --  0.40  --   --   --  0.59  CREATININE 0.71 0.60*  --   --  0.70 0.71  --     Estimated Creatinine Clearance: 83.1 mL/min (by C-G formula based on SCr of 0.71 mg/dL).  . sodium chloride 20 mL/hr at 05/14/17 1900  . heparin 2,050 Units/hr (05/15/17 1300)  . norepinephrine (LEVOPHED) Adult infusion Stopped (05/12/17 1745)   . sodium chloride 20 mL/hr at 05/14/17 1900  . heparin 2,050 Units/hr (05/15/17 1300)  . norepinephrine (LEVOPHED) Adult infusion Stopped (05/12/17 1745)     Assessment: 71 year old male with h/o CHF EF 30% 01/2017, now EF 15% post surgery/arrest admitted for ascending aorta repair 04/29/17. Hx AVR on warfarin PTA restarted 7/1, on IV heparin while INR is subtherapeutic 1.5. Heparin drip 2050 uts/hr HL 0.59 slightly > goal. Hgb fairly stable.  Pltc rising.  No further nosebleeds overnight.  Patient reports issues with frequent nosebleeds for past several years.  Goal of Therapy:  Heparin level = 0.3 - 0.5 units / ml Monitor platelets by anticoagulation protocol: Yes   Plan:  Decrease IV Heparin rate of 2000 units/hr. Daily heparin level and CBC. Coumadin resumed - dosing per physician.  Leota Sauers Pharm.D. CPP, BCPS Clinical Pharmacist [phone] 05/15/2017 2:19 PM

## 2017-05-15 NOTE — Progress Notes (Signed)
Progress Note  Patient Name: Jack Joseph Date of Encounter: 05/15/2017  Primary Cardiologist: [place]  Subjective   Feels well. Denies sob or trouble swallowing  Inpatient Medications    Scheduled Meds: . amiodarone  200 mg Oral BID  . bisacodyl  10 mg Oral Daily   Or  . bisacodyl  10 mg Rectal Daily  . chlorhexidine gluconate (MEDLINE KIT)  15 mL Mouth Rinse BID  . Chlorhexidine Gluconate Cloth  6 each Topical Daily  . docusate sodium  200 mg Oral Daily  . feeding supplement (ENSURE ENLIVE)  237 mL Oral BID BM  . levalbuterol  0.63 mg Nebulization TID  . magic mouthwash  5 mL Oral TID  . mouth rinse  15 mL Mouth Rinse BID  . pantoprazole  40 mg Oral Daily  . sodium chloride flush  10-40 mL Intracatheter Q12H  . sodium chloride flush  3 mL Intravenous Q12H  . warfarin  2.5 mg Oral q1800  . Warfarin - Physician Dosing Inpatient   Does not apply q1800   Continuous Infusions: . sodium chloride 20 mL/hr at 05/14/17 1900  . heparin 2,050 Units/hr (05/14/17 1900)  . norepinephrine (LEVOPHED) Adult infusion Stopped (05/12/17 1745)   PRN Meds: diphenhydrAMINE, guaiFENesin, levalbuterol, ondansetron (ZOFRAN) IV, oxyCODONE, sodium chloride flush, sodium chloride flush   Vital Signs    Vitals:   05/15/17 0500 05/15/17 0600 05/15/17 0730 05/15/17 0753  BP: 123/62 123/74    Pulse:  73    Resp: (!) 21 (!) 22    Temp:   98.2 F (36.8 C)   TempSrc:   Oral   SpO2:  96%  98%  Weight: 155 lb 3.3 oz (70.4 kg)     Height:        Intake/Output Summary (Last 24 hours) at 05/15/17 0923 Last data filed at 05/15/17 0600  Gross per 24 hour  Intake           1730.5 ml  Output              650 ml  Net           1080.5 ml   Filed Weights   05/13/17 0500 05/14/17 0500 05/15/17 0500  Weight: 149 lb 0.5 oz (67.6 kg) 149 lb 4 oz (67.7 kg) 155 lb 3.3 oz (70.4 kg)    Telemetry    nsr - Personally Reviewed  ECG    none - Personally Reviewed  Physical Exam   GEN: frail  but no acute distress.   Neck: 6 cm JVD Cardiac: RRR, no murmurs, rubs, or gallops.  Respiratory: Clear to auscultation bilaterally. GI: Soft, nontender, non-distended  MS: No edema; No deformity. Neuro:  Nonfocal  Psych: Normal affect   Labs    Chemistry Recent Labs Lab 05/09/17 0655  05/12/17 0432  05/13/17 0415 05/13/17 1536 05/14/17 1521 05/15/17 0357  NA 136  < > 137  < > 139 141 142 142  K 3.8  < > 4.0  < > 4.1 3.8 3.7 3.9  CL 97*  < > 102  < > 106 104 104 108  CO2 26  < > 27  --  27  --   --  26  GLUCOSE 141*  < > 144*  < > 129* 147* 124* 99  BUN 29*  < > 26*  < > 22* 22* 20 16  CREATININE 1.05  < > 0.90  < > 0.71 0.60* 0.70 0.71  CALCIUM 7.7*  < >  8.0*  --  8.2*  --   --  8.3*  PROT 5.0*  --   --   --  5.4*  --   --  5.4*  ALBUMIN 1.9*  --   --   --  1.8*  --   --  1.9*  AST 156*  --   --   --  70*  --   --  28  ALT 153*  --   --   --  125*  --   --  67*  ALKPHOS 115  --   --   --  170*  --   --  140*  BILITOT 1.3*  --   --   --  0.7  --   --  0.7  GFRNONAA >60  < > >60  --  >60  --   --  >60  GFRAA >60  < > >60  --  >60  --   --  >60  ANIONGAP 13  < > 8  --  6  --   --  8  < > = values in this interval not displayed.   Hematology Recent Labs Lab 05/13/17 0415  05/14/17 0843 05/14/17 1521 05/15/17 0357  WBC 14.7*  --  16.6*  --  13.4*  RBC 3.08*  --  3.08*  --  2.87*  HGB 9.1*  < > 9.0* 8.8* 8.4*  HCT 29.0*  < > 29.2* 26.0* 27.6*  MCV 94.2  --  94.8  --  96.2  MCH 29.5  --  29.2  --  29.3  MCHC 31.4  --  30.8  --  30.4  RDW 15.6*  --  15.3  --  15.4  PLT 707*  --  767*  --  818*  < > = values in this interval not displayed.  Cardiac EnzymesNo results for input(s): TROPONINI in the last 168 hours. No results for input(s): TROPIPOC in the last 168 hours.   BNPNo results for input(s): BNP, PROBNP in the last 168 hours.   DDimer No results for input(s): DDIMER in the last 168 hours.   Radiology    Dg Chest Port 1 View  Result Date:  05/15/2017 CLINICAL DATA:  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 Right chest tube in place. EXAM: PORTABLE CHEST 1 VIEW COMPARISON:  Single-view of the chest 05/14/2017 and 05/13/2017. FINDINGS: Small bore right chest tube remains in place. There is a tiny right apical pneumothorax, unchanged. Subcutaneous emphysema is seen over the chest. Left pleural effusion appears slightly decreased compared to yesterday's examination. Left basilar airspace disease is unchanged. Prosthetic aortic valve and intact median sternotomy wires are unchanged. IMPRESSION: Tiny right apical pneumothorax with a chest tube in place, unchanged. Slight decrease in a left pleural effusion. Left basilar airspace disease unchanged. Electronically Signed   By: Inge Rise M.D.   On: 05/15/2017 08:45   Dg Chest Port 1 View  Result Date: 05/14/2017 CLINICAL DATA:  Status post chest tube removal EXAM: PORTABLE CHEST 1 VIEW COMPARISON:  05/13/2017 FINDINGS: Cardiac shadow is stable. Postoperative changes are again seen. Moderate left-sided pleural effusion is again noted. The large bore chest tube on the right has been removed in the interval. Stable apical right pneumothorax is seen. Small bore chest tube remains in place. A right-sided PICC line is noted and stable. Feeding catheter has been removed. IMPRESSION: Stable right apical pneumothorax following chest tube removal. Stable moderate left pleural effusion. Tubes and lines  as described. Electronically Signed   By: Inez Catalina M.D.   On: 05/14/2017 08:44   Dg Swallowing Func-speech Pathology  Result Date: 05/14/2017 Objective Swallowing Evaluation: Type of Study: MBS-Modified Barium Swallow Study Patient Details Name: Jack Joseph MRN: [id number] Date of Birth: [birth year] Today's Date: 05/14/2017 Time: SLP Start Time (ACUTE ONLY): 0930-SLP Stop Time (ACUTE ONLY): 1030 SLP Time Calculation (min) (ACUTE ONLY): 60 min Past Medical History: Past  Medical History: Diagnosis Date . AVD (aortic valve disease)  . Chronic anticoagulation  . Dilated cardiomyopathy (HCC)   EF 30-35% . Dysrhythmia  . Hyperlipidemia  . LV dysfunction  Past Surgical History: Past Surgical History: Procedure Laterality Date . AORTIC VALVE REPLACEMENT   . BENTALL PROCEDURE N/A 04/29/2017  Procedure: REPLACEMENT ASCENDING AORTA/ WHEAT PROCEDURE, HYPOTHERMIC CIRCULATORY ARREST AND CARDIOPULMONARY BYPASS, AND USE OF HEMASHIELD PLATINUM WOVEN DOUBLE VELOUR VASCULAR GRAFT 32 MM X 50 CM;  Surgeon: [name], MD;  Location: [facility];  Service: Open Heart Surgery;  Laterality: N/A; . DOPPLER ECHOCARDIOGRAPHY  04/07/2002  EF 30-35% . RIGHT/LEFT HEART CATH AND CORONARY ANGIOGRAPHY N/A 03/05/2017  Procedure: Right/Left Heart Cath and Coronary Angiography;  Surgeon: Peter M Martinique, MD;  Location: [facility];  Service: Cardiovascular;  Laterality: N/A; . TEE WITHOUT CARDIOVERSION N/A 04/29/2017  Procedure: TRANSESOPHAGEAL ECHOCARDIOGRAM (TEE);  Surgeon: [name], MD;  Location: [facility];  Service: Open Heart Surgery;  Laterality: N/A; HPI: 71 yo male former smoker with hx remote AVR 1983 on chronic coumadin, COPD, LV dysfunction with EF 30-35%, dilated ascending aorta who initially presented 6/18 for elective surgical repair. Underwent ascending aortic replacement/wheat procedure with CABG. Was progressing well post op and ambulating in hall. However just after midnight 6/20 had VT arrest with CPR and shock x 3. Intubated during ACLS.  Right pneumothorax with chest tube x2. ETT 6/20-6/21; reintubated 6/21-6/24, reintubated 6/26-7/1.  Subjective: alert, participatory Assessment / Plan / Recommendation CHL IP CLINICAL IMPRESSIONS 05/14/2017 Clinical Impression Pt presents with remarkably strong swallow function despite four intubations and deconditioning.   There is adequate mastication; timely swallow initiation; good mobility of hyolaryngeal complex, leading to adequate airway  closure for most consistencies and strong pharyngeal clearance (no residue post-swallow).  Impairments were limited to decreased laryngeal closure during consumption of thin liquids, leading to trace silent aspiration.  A chin tuck adequately protected airway, but due to pt's fluctuating MS, he was not able to consistently execute chin tuck.  For now, recommend initiating a dysphagia 3 diet with nectar-thick liquids; meds whole in puree.  Discussed results and recs with pt/wife, who verbalize understanding.  SLP will follow for safety and diet progression.  SLP Visit Diagnosis Dysphagia, pharyngeal phase (R13.13) Attention and concentration deficit following -- Frontal lobe and executive function deficit following -- Impact on safety and function Mild aspiration risk   CHL IP TREATMENT RECOMMENDATION 05/14/2017 Treatment Recommendations Therapy as outlined in treatment plan below   Prognosis 05/14/2017 Prognosis for Safe Diet Advancement Good Barriers to Reach Goals -- Barriers/Prognosis Comment -- CHL IP DIET RECOMMENDATION 05/14/2017 SLP Diet Recommendations Dysphagia 3 (Mech soft) solids;Nectar thick liquid Liquid Administration via Cup Medication Administration Whole meds with puree Compensations Slow rate;Small sips/bites;Minimize environmental distractions Postural Changes Seated upright at 90 degrees   CHL IP OTHER RECOMMENDATIONS 05/14/2017 Recommended Consults -- Oral Care Recommendations Oral care BID Other Recommendations Order thickener from pharmacy   CHL IP FOLLOW UP RECOMMENDATIONS 05/14/2017 Follow up Recommendations (No Data)   CHL IP FREQUENCY AND DURATION  05/14/2017 Speech Therapy Frequency (ACUTE ONLY) min 2x/week Treatment Duration 2 weeks      CHL IP ORAL PHASE 05/14/2017 Oral Phase WFL Oral - Pudding Teaspoon -- Oral - Pudding Cup -- Oral - Honey Teaspoon -- Oral - Honey Cup -- Oral - Nectar Teaspoon -- Oral - Nectar Cup -- Oral - Nectar Straw -- Oral - Thin Teaspoon -- Oral - Thin Cup -- Oral - Thin Straw  -- Oral - Puree -- Oral - Mech Soft -- Oral - Regular -- Oral - Multi-Consistency -- Oral - Pill -- Oral Phase - Comment --  CHL IP PHARYNGEAL PHASE 05/14/2017 Pharyngeal Phase Impaired Pharyngeal- Pudding Teaspoon -- Pharyngeal -- Pharyngeal- Pudding Cup -- Pharyngeal -- Pharyngeal- Honey Teaspoon -- Pharyngeal -- Pharyngeal- Honey Cup -- Pharyngeal -- Pharyngeal- Nectar Teaspoon -- Pharyngeal -- Pharyngeal- Nectar Cup WFL Pharyngeal -- Pharyngeal- Nectar Straw -- Pharyngeal -- Pharyngeal- Thin Teaspoon -- Pharyngeal -- Pharyngeal- Thin Cup Reduced airway/laryngeal closure;Trace aspiration;Penetration/Aspiration during swallow Pharyngeal Material enters airway, passes BELOW cords without attempt by patient to eject out (silent aspiration) Pharyngeal- Thin Straw -- Pharyngeal -- Pharyngeal- Puree WFL Pharyngeal -- Pharyngeal- Mechanical Soft -- Pharyngeal -- Pharyngeal- Regular WFL Pharyngeal -- Pharyngeal- Multi-consistency -- Pharyngeal -- Pharyngeal- Pill -- Pharyngeal -- Pharyngeal Comment --  CHL IP CERVICAL ESOPHAGEAL PHASE 05/14/2017 Cervical Esophageal Phase WFL Pudding Teaspoon -- Pudding Cup -- Honey Teaspoon -- Honey Cup -- Nectar Teaspoon -- Nectar Cup -- Nectar Straw -- Thin Teaspoon -- Thin Cup -- Thin Straw -- Puree -- Mechanical Soft -- Regular -- Multi-consistency -- Pill -- Cervical Esophageal Comment -- No flowsheet data found. Juan Quam [name] 05/14/2017, 1:00 PM               Cardiac Studies   none  Patient Profile     71 y.o. male with aortic root replacement and VT and VDRF, now better  Assessment & Plan    1. VT - he is free of VT over the past week. We will reduce amiodarone as liver panel demonstrates transaminases slightly up. At this point, I would suggest he wear a Life vest with ICD insertion 4-6 weeks after DC from the hospital. 2. VDRF - much improved. Appears euvolemic   Signed, Cristopher Peru, MD  05/15/2017, 9:23 AM  Patient ID: [name], male   DOB:  [birth year], 71 y.o.   MRN: [id number]

## 2017-05-15 NOTE — Progress Notes (Signed)
Chest tube removed per order. Patient tolerated procedure well. Vital signs stable Will continue to monitor.

## 2017-05-15 NOTE — Progress Notes (Signed)
Report given to [facility], RN who will take over patient care for remainder of shift.

## 2017-05-15 NOTE — Progress Notes (Signed)
Patient increasingly more confused and tired as the day progressed. Nasal cannula increased to 4l as patients 02 dropped to 86. MD at bedside. Chest xray and lasix ordered. Will continue to monitor.

## 2017-05-15 NOTE — Progress Notes (Signed)
  Speech Language Pathology Treatment: Dysphagia  Patient Details Name: Jack Joseph MRN: [id number] DOB: Jul 20, 1946 Today's Date: 05/15/2017 Time: 1415-1500 SLP Time Calculation (min) (ACUTE ONLY): 45 min  Assessment / Plan / Recommendation Clinical Impression  F/u after yesterday's MBS.  Pt more confused/restless today, requiring assurances, distraction, and redirection.  Educated pt's wife, daughter re: strategies to help minimize agitation and confusion.  Reviewed video of MBS, discussed necessity of remaining on nectar-thick liquids for now - pt unable to recall and execute a chin tuck for airway protection with thins.  Demonstrated procedure for thickening liquids; dtr able to return demonstrate. Pt requires min verbal cues to slow rate/quantity, but tolerated dys3/nectars with no overt s/s of aspiration.  Lungs with rhonchi upper lobes.   Recommend continued current diet with consistency restrictions.  Agree that pt is good candidate for CIR - will benefit from dysphagia as well as cognitive intervention.      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 Underwent ascending aortic replacement/wheat procedure with CABG. Was progressing well post op and ambulating in hall. However just after midnight 6/20 had VT arrest with CPR and shock x 3. Intubated during ACLS.  Right pneumothorax with chest tube x2. ETT 6/20-6/21; reintubated 6/21-6/24, reintubated 6/26-7/1.       SLP Plan  Continue with current plan of care       Recommendations  Diet recommendations: Dysphagia 3 (mechanical soft);Nectar-thick liquid Liquids provided via: Cup;Straw Medication Administration: Whole meds with puree Supervision: Full supervision/cueing for compensatory strategies Compensations: Slow rate;Small sips/bites;Minimize environmental  distractions Postural Changes and/or Swallow Maneuvers: Seated upright 90 degrees                Oral Care Recommendations: Oral care BID SLP Visit Diagnosis: Dysphagia, pharyngeal phase (R13.13) Plan: Continue with current plan of care       GO              Thaily Hackworth L. Samson Frederic, [place] CCC/SLP Pager [phone]   [name] [name] 05/15/2017, 3:11 PM

## 2017-05-15 NOTE — Progress Notes (Signed)
Rehab Admissions Coordinator Note:  Patient was screened by Trish Mage for appropriateness for an Inpatient Acute Rehab Consult.  At this time, we are recommending Inpatient Rehab consult.  Lelon Frohlich M 05/15/2017, 12:04 PM  I can be reached at [phone].

## 2017-05-15 NOTE — Evaluation (Signed)
Physical Therapy Evaluation Patient Details Name: Jack Joseph MRN: [id number] DOB: Feb 18, 1946 Today's Date: 05/15/2017   History of Present Illness  71 y.o. male with aortic root replacement and CABG with VT arrest post op and VDRF with intubations x 3.  HCAP, Right chest tube with PTX.  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 Pt currently with functional limitations due to the deficits listed below (see PT Problem List). Pt did well overall needing +2 mod assist for all mobility with use of RW and chair follow. MD:  Pt would benefit from OT consult.  Please order if you agree.   Also pt is a great rehab candidate.  Will follow acutely. Pt will benefit from skilled PT to increase their independence and safety with mobility to allow discharge to the venue listed below.    Follow Up Recommendations CIR;Supervision/Assistance - 24 hour    Equipment Recommendations  Other (comment) (TBA)    Recommendations for Other Services Rehab consult;OT consult     Precautions / Restrictions Precautions Precautions: Fall;Sternal Restrictions Weight Bearing Restrictions: No      Mobility  Bed Mobility Overal bed mobility: Needs Assistance Bed Mobility: Rolling;Sidelying to Sit Rolling: Min assist Sidelying to sit: Mod assist       General bed mobility comments: Needed cues for sternal precautions as well as assist for elevation of trunk.  Pt hugging pillow.    Transfers Overall transfer level: Needs assistance Equipment used: Rolling walker (2 wheeled) Transfers: Sit to/from [employer] Sit to Stand: Mod assist;+2 safety/equipment;From elevated surface Stand pivot transfers: Min assist;+2 physical assistance       General transfer comment: Pt said he had to have BM once he sat up therefore obtained 3N1 and assisted pt onto 3N1 with assist as above  with pt needing assist to power up and steadying assist as he is uncoordinated.  Pt with BM as he was transferring but only a small amount.  Loose stool.  Needed same assist to stand to RW once done and total assist to clean pt of BM.    Ambulation/Gait Ambulation/Gait assistance: Mod assist;+2 safety/equipment Ambulation Distance (Feet): 100 Feet (50 feet x 2) Assistive device: Rolling walker (2 wheeled) Gait Pattern/deviations: Decreased step length - right;Decreased step length - left;Decreased stride length;Shuffle;Leaning posteriorly;Drifts right/left;Trunk flexed;Wide base of support   Gait velocity interpretation: Below normal speed for age/gender General Gait Details: Pt needed cues to stand tall and stay close to RW.  Slightly flexed and had difficulty with upright posture needing assist and cues throghout.  Uncoordinated gait at times.  Assist to steer RW as well.    Stairs            Wheelchair Mobility    Modified Rankin (Stroke Patients Only)       Balance Overall balance assessment: Needs assistance Sitting-balance support: No upper extremity supported;Feet supported Sitting balance-Leahy Scale: Fair     Standing balance support: Bilateral upper extremity supported;During functional activity Standing balance-Leahy Scale: Poor Standing balance comment: relies on RW for balance.                              Pertinent Vitals/Pain Pain Assessment: Faces Faces Pain Scale: Hurts little more Pain Location: generalized Pain Descriptors / Indicators: Grimacing;Guarding;Discomfort Pain Intervention(s): Limited activity within patient's tolerance;Monitored during session;Repositioned  89 bpm, 97% on 2LO2, 110/74.   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    Prior Function Level of Independence: Independent               Hand Dominance        Extremity/Trunk Assessment   Upper Extremity Assessment Upper Extremity Assessment: Defer to OT evaluation    Lower Extremity Assessment Lower Extremity Assessment: RLE deficits/detail;LLE deficits/detail RLE Deficits / Details: grossly 3-/5 LLE Deficits / Details: grossly 3-/5    Cervical / Trunk Assessment Cervical / Trunk Assessment: Kyphotic  Communication   Communication: Other (comment) (speaks in whisper, hoarse from previous intubations)  Cognition Arousal/Alertness: Awake/alert Behavior During Therapy: Anxious Overall Cognitive Status: Within Functional Limits for tasks assessed                                        General Comments General comments (skin integrity, edema, etc.): Encouraged use of spirometer and Acapella with pt able to demonstrate use of both.      Exercises General Exercises - Lower Extremity Long Arc Quad: AROM;Both;10 reps;Seated   Assessment/Plan    PT Assessment Patient needs continued PT services  PT Problem List Decreased strength;Decreased activity tolerance;Decreased balance;Decreased mobility;Decreased coordination;Decreased knowledge of use of DME;Cardiopulmonary status limiting activity;Decreased knowledge of precautions;Decreased safety awareness;Pain       PT Treatment Interventions DME instruction;Gait training;Functional mobility training;Stair training;Therapeutic activities;Therapeutic exercise;Balance training;Patient/family education    PT Goals (Current goals can be found in the Care Plan section)  Acute Rehab PT Goals Patient Stated Goal: to go home PT Goal  Formulation: With patient Time For Goal Achievement: 05/29/17 Potential to Achieve Goals: Good    Frequency Min 3X/week   Barriers to discharge        Co-evaluation               AM-PAC PT "6 Clicks" Daily Activity  Outcome Measure Difficulty turning over in bed (including adjusting bedclothes, sheets and blankets)?: A Lot Difficulty moving from lying on back to sitting on the side of the bed? : A Lot Difficulty sitting down on and standing up from a chair with arms (e.g., wheelchair, bedside commode, etc,.)?: A Lot Help needed moving to and from a bed to chair (including a wheelchair)?: A Lot Help needed walking in hospital room?: A Lot Help needed climbing 3-5 steps with a railing? : Total 6 Click Score: 11    End of Session Equipment Utilized During Treatment: Gait belt;Oxygen Activity Tolerance: Patient limited by fatigue Patient left: in chair;with call bell/phone within reach;with chair alarm set;with family/visitor present Nurse Communication: Mobility status PT Visit Diagnosis: Unsteadiness on feet (R26.81);Muscle weakness (generalized) (M62.81)    Time: 1610-9604 PT Time Calculation (min) (ACUTE ONLY): 44 min   Charges:   PT Evaluation $PT Eval Moderate Complexity: 1 Procedure PT Treatments $Gait Training: 8-22 mins $Self Care/Home Management: 8-22   PT G Codes:        Jack Joseph,PT Acute Rehabilitation [phone] (pager)   Jack Joseph 05/15/2017, 11:47 AM

## 2017-05-16 ENCOUNTER — Inpatient Hospital Stay (HOSPITAL_COMMUNITY): Payer: Medicare Other

## 2017-05-16 DIAGNOSIS — R5381 Other malaise: Secondary | ICD-10-CM

## 2017-05-16 DIAGNOSIS — J9811 Atelectasis: Secondary | ICD-10-CM

## 2017-05-16 LAB — BASIC METABOLIC PANEL
Anion gap: 8 (ref 5–15)
BUN: 24 mg/dL — ABNORMAL HIGH (ref 6–20)
CO2: 25 mmol/L (ref 22–32)
Calcium: 8.3 mg/dL — ABNORMAL LOW (ref 8.9–10.3)
Chloride: 108 mmol/L (ref 101–111)
Creatinine, Ser: 0.87 mg/dL (ref 0.61–1.24)
GFR calc Af Amer: >60 mL/min (ref >60)
GFR calc non Af Amer: >60 mL/min (ref >60)
Glucose, Bld: 111 mg/dL — ABNORMAL HIGH (ref 65–99)
Potassium: 3.4 mmol/L — ABNORMAL LOW (ref 3.5–5.1)
Sodium: 141 mmol/L (ref 135–145)

## 2017-05-16 LAB — CBC
HCT: 28.9 % — ABNORMAL LOW (ref 39.0–52.0)
Hemoglobin: 8.9 g/dL — ABNORMAL LOW (ref 13.0–17.0)
MCH: 29.5 pg (ref 26.0–34.0)
MCHC: 30.8 g/dL (ref 30.0–36.0)
MCV: 95.7 fL (ref 78.0–100.0)
Platelets: 851 10*3/uL — ABNORMAL HIGH (ref 150–400)
RBC: 3.02 MIL/uL — ABNORMAL LOW (ref 4.22–5.81)
RDW: 15.3 % (ref 11.5–15.5)
WBC: 10.3 10*3/uL (ref 4.0–10.5)

## 2017-05-16 LAB — POCT I-STAT, CHEM 8
BUN: 21 mg/dL — ABNORMAL HIGH (ref 6–20)
Calcium, Ion: 1.12 mmol/L — ABNORMAL LOW (ref 1.15–1.40)
Chloride: 102 mmol/L (ref 101–111)
Creatinine, Ser: 0.8 mg/dL (ref 0.61–1.24)
Glucose, Bld: 96 mg/dL (ref 65–99)
HCT: 24 % — ABNORMAL LOW (ref 39.0–52.0)
Hemoglobin: 8.2 g/dL — ABNORMAL LOW (ref 13.0–17.0)
Potassium: 3.1 mmol/L — ABNORMAL LOW (ref 3.5–5.1)
Sodium: 140 mmol/L (ref 135–145)
TCO2: 27 mmol/L (ref 0–100)

## 2017-05-16 LAB — HEPARIN LEVEL (UNFRACTIONATED): Heparin Unfractionated: 0.41 IU/mL (ref 0.30–0.70)

## 2017-05-16 LAB — C DIFFICILE QUICK SCREEN W PCR REFLEX
C Diff antigen: POSITIVE — AB
C Diff toxin: NEGATIVE

## 2017-05-16 LAB — GLUCOSE, CAPILLARY: Glucose-Capillary: 92 mg/dL (ref 65–99)

## 2017-05-16 LAB — CLOSTRIDIUM DIFFICILE BY PCR: Toxigenic C. Difficile by PCR: NEGATIVE

## 2017-05-16 LAB — PROTIME-INR
INR: 1.72
Prothrombin Time: 20.3 seconds — ABNORMAL HIGH (ref 11.4–15.2)

## 2017-05-16 MED ORDER — POTASSIUM CHLORIDE 10 MEQ/50ML IV SOLN
10.0000 meq | INTRAVENOUS | Status: AC
  Administered 2017-05-16 (×3): 10 meq via INTRAVENOUS
  Filled 2017-05-16 (×3): qty 50

## 2017-05-16 MED ORDER — AMIODARONE HCL 200 MG PO TABS
200.0000 mg | ORAL_TABLET | Freq: Every day | ORAL | Status: DC
  Administered 2017-05-16 – 2017-05-23 (×8): 200 mg via ORAL
  Filled 2017-05-16 (×8): qty 1

## 2017-05-16 MED ORDER — FUROSEMIDE 10 MG/ML IJ SOLN
40.0000 mg | Freq: Once | INTRAMUSCULAR | Status: AC
  Administered 2017-05-16: 40 mg via INTRAVENOUS
  Filled 2017-05-16: qty 4

## 2017-05-16 NOTE — Progress Notes (Signed)
 Progress Note  Patient Name: Jack Joseph Date of Encounter: 05/16/2017  Primary Cardiologist: Jordan  Subjective   Some diarrhea overnight. No chest pain. Dyspnea stable.   Inpatient Medications    Scheduled Meds: . amiodarone  200 mg Oral Daily  . chlorhexidine gluconate (MEDLINE KIT)  15 mL Mouth Rinse BID  . Chlorhexidine Gluconate Cloth  6 each Topical Daily  . feeding supplement (ENSURE ENLIVE)  237 mL Oral BID BM  . levalbuterol  0.63 mg Nebulization TID  . magic mouthwash  5 mL Oral TID  . mouth rinse  15 mL Mouth Rinse BID  . pantoprazole  40 mg Oral Daily  . sodium chloride flush  10-40 mL Intracatheter Q12H  . sodium chloride flush  3 mL Intravenous Q12H  . warfarin  4 mg Oral q1800  . Warfarin - Physician Dosing Inpatient   Does not apply q1800   Continuous Infusions: . sodium chloride 10 mL/hr at 05/16/17 0700  . heparin 2,000 Units/hr (05/16/17 0700)  . norepinephrine (LEVOPHED) Adult infusion Stopped (05/12/17 1745)  . potassium chloride 10 mEq (05/16/17 0659)   PRN Meds: diphenhydrAMINE, guaiFENesin, levalbuterol, ondansetron (ZOFRAN) IV, oxyCODONE, sodium chloride flush, sodium chloride flush   Vital Signs    Vitals:   05/16/17 0535 05/16/17 0600 05/16/17 0650 05/16/17 0700  BP:  (!) 92/51  111/60  Pulse: 87 87  93  Resp: 19 17  15  Temp:      TempSrc:      SpO2: 95% 95% 96% 93%  Weight:      Height:        Intake/Output Summary (Last 24 hours) at 05/16/17 0744 Last data filed at 05/16/17 0700  Gross per 24 hour  Intake           842.66 ml  Output             1525 ml  Net          -682.34 ml   Filed Weights   05/14/17 0500 05/15/17 0500 05/16/17 0500  Weight: 149 lb 4 oz (67.7 kg) 155 lb 3.3 oz (70.4 kg) 148 lb 5.9 oz (67.3 kg)    Telemetry    nsr with rare PVC's - Personally Reviewed  ECG    none - Personally Reviewed  Physical Exam   GEN: stable but chronically ill appearing no acute distress.   Neck: 7 cm  JVD Cardiac: RRR, no murmurs, rubs, or gallops.  Respiratory: Clear to auscultation bilaterally with scattered rales in left base GI: Soft, nontender, non-distended  MS: No edema; No deformity. Neuro:  Nonfocal  Psych: Normal affect   Labs    Chemistry Recent Labs Lab 05/13/17 0415  05/15/17 0357 05/15/17 1716 05/16/17 0415  NA 139  < > 142 141 141  K 4.1  < > 3.9 3.8 3.4*  CL 106  < > 108 105 108  CO2 27  --  26  --  25  GLUCOSE 129*  < > 99 96 111*  BUN 22*  < > 16 20 24*  CREATININE 0.71  < > 0.71 0.70 0.87  CALCIUM 8.2*  --  8.3*  --  8.3*  PROT 5.4*  --  5.4*  --   --   ALBUMIN 1.8*  --  1.9*  --   --   AST 70*  --  28  --   --   ALT 125*  --  67*  --   --     ALKPHOS 170*  --  140*  --   --   BILITOT 0.7  --  0.7  --   --   GFRNONAA >60  --  >60  --  >60  GFRAA >60  --  >60  --  >60  ANIONGAP 6  --  8  --  8  < > = values in this interval not displayed.   Hematology Recent Labs Lab 05/14/17 0843  05/15/17 0357 05/15/17 1716 05/16/17 0415  WBC 16.6*  --  13.4*  --  10.3  RBC 3.08*  --  2.87*  --  3.02*  HGB 9.0*  < > 8.4* 9.2* 8.9*  HCT 29.2*  < > 27.6* 27.0* 28.9*  MCV 94.8  --  96.2  --  95.7  MCH 29.2  --  29.3  --  29.5  MCHC 30.8  --  30.4  --  30.8  RDW 15.3  --  15.4  --  15.3  PLT 767*  --  818*  --  851*  < > = values in this interval not displayed.  Cardiac EnzymesNo results for input(s): TROPONINI in the last 168 hours. No results for input(s): TROPIPOC in the last 168 hours.   BNPNo results for input(s): BNP, PROBNP in the last 168 hours.   DDimer No results for input(s): DDIMER in the last 168 hours.   Radiology    Dg Chest Port 1 View  Result Date: 05/16/2017 CLINICAL DATA:  71-year-old male postoperative day 17 status post cardiothoracic surgery, replacement of ascending aorta/Wheat procedure. Right chest tube removal yesterday. EXAM: PORTABLE CHEST 1 VIEW COMPARISON:  05/15/2017 and earlier. FINDINGS: Portable AP semi upright view at  0611 hours. Mild volume of bilateral upper chest and supraclavicular subcutaneous emphysema is stable. Skin fold artifact. No pneumothorax identified. Stable right PICC line.  Epicardial pacer leads remain in place. Stable cardiac size and mediastinal contours. Left lower lobe collapse or consolidation with small left pleural effusion. No pulmonary edema. Stable ventilation since yesterday. IMPRESSION: 1. No pneumothorax or new cardiopulmonary abnormality identified. 2. Stable left lower lobe collapse or consolidation and probable small left pleural effusion. Electronically Signed   By: H  Hall M.D.   On: 05/16/2017 07:24   Dg Chest Port 1 View  Result Date: 05/15/2017 CLINICAL DATA:  Chest tube removal this morning. EXAM: PORTABLE CHEST 1 VIEW COMPARISON:  Most recent radiographs earlier this day. Chest CT 05/07/2017 FINDINGS: Right pigtail catheter is been removed. Previous tiny apical pneumothorax is tentatively identified and unchanged. No progression. Right upper extremity PICC tip in the mid SVC. Linear peripheral opacity in the right midlung may be minimal fluid in the fissure or atelectasis, this appears along the course of prior chest tube. Stable cardiomegaly, median sternotomy and prosthetic valve. Stable retrocardiac opacity in left pleural effusion. Subcutaneous emphysema again seen, not significantly changed. IMPRESSION: 1. Removal of right pigtail catheter. Unchanged tiny right apical pneumothorax from exam earlier this day. 2. Minimal developing linear opacity in the right mid lung may be pleural fluid in the fissure or atelectasis/scarring, may be along the course of prior chest tube. 3. Unchanged left lung base opacity and pleural effusion. Electronically Signed   By: Melanie  Ehinger M.D.   On: 05/15/2017 20:01   Dg Chest Port 1 View  Result Date: 05/15/2017 CLINICAL DATA:  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 Right chest tube in place. EXAM: PORTABLE  CHEST 1 VIEW COMPARISON:  Single-view of the chest 05/14/2017   and 05/13/2017. FINDINGS: Small bore right chest tube remains in place. There is a tiny right apical pneumothorax, unchanged. Subcutaneous emphysema is seen over the chest. Left pleural effusion appears slightly decreased compared to yesterday's examination. Left basilar airspace disease is unchanged. Prosthetic aortic valve and intact median sternotomy wires are unchanged. IMPRESSION: Tiny right apical pneumothorax with a chest tube in place, unchanged. Slight decrease in a left pleural effusion. Left basilar airspace disease unchanged. Electronically Signed   By: Inge Rise M.D.   On: 05/15/2017 08:45   Dg Chest Port 1 View  Result Date: 05/14/2017 CLINICAL DATA:  Status post chest tube removal EXAM: PORTABLE CHEST 1 VIEW COMPARISON:  05/13/2017 FINDINGS: Cardiac shadow is stable. Postoperative changes are again seen. Moderate left-sided pleural effusion is again noted. The large bore chest tube on the right has been removed in the interval. Stable apical right pneumothorax is seen. Small bore chest tube remains in place. A right-sided PICC line is noted and stable. Feeding catheter has been removed. IMPRESSION: Stable right apical pneumothorax following chest tube removal. Stable moderate left pleural effusion. Tubes and lines as described. Electronically Signed   By: Inez Catalina M.D.   On: 05/14/2017 08:44   Dg Swallowing Func-speech Pathology  Result Date: 05/14/2017 Objective Swallowing Evaluation: Type of Study: MBS-Modified Barium Swallow Study Patient Details Name: Jack Joseph MRN: [id number] Date of Birth: [birth year] Today's Date: 05/14/2017 Time: SLP Start Time (ACUTE ONLY): 0930-SLP Stop Time (ACUTE ONLY): 1030 SLP Time Calculation (min) (ACUTE ONLY): 60 min Past Medical History: Past Medical History: Diagnosis Date . AVD (aortic valve disease)  . Chronic anticoagulation  . Dilated cardiomyopathy (HCC)   EF 30-35% . Dysrhythmia   . Hyperlipidemia  . LV dysfunction  Past Surgical History: Past Surgical History: Procedure Laterality Date . AORTIC VALVE REPLACEMENT   . BENTALL PROCEDURE N/A 04/29/2017  Procedure: REPLACEMENT ASCENDING AORTA/ WHEAT PROCEDURE, HYPOTHERMIC CIRCULATORY ARREST AND CARDIOPULMONARY BYPASS, AND USE OF HEMASHIELD PLATINUM WOVEN DOUBLE VELOUR VASCULAR GRAFT 32 MM X 50 CM;  Surgeon: [name], MD;  Location: [facility];  Service: Open Heart Surgery;  Laterality: N/A; . DOPPLER ECHOCARDIOGRAPHY  04/07/2002  EF 30-35% . RIGHT/LEFT HEART CATH AND CORONARY ANGIOGRAPHY N/A 03/05/2017  Procedure: Right/Left Heart Cath and Coronary Angiography;  Surgeon: Peter M Martinique, MD;  Location: [facility];  Service: Cardiovascular;  Laterality: N/A; . 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 Underwent ascending aortic replacement/wheat procedure with CABG. Was progressing well post op and ambulating in hall. However just after midnight 6/20 had VT arrest with CPR and shock x 3. Intubated during ACLS.  Right pneumothorax with chest tube x2. ETT 6/20-6/21; reintubated 6/21-6/24, reintubated 6/26-7/1.  Subjective: alert, participatory Assessment / Plan / Recommendation CHL IP CLINICAL IMPRESSIONS 05/14/2017 Clinical Impression Pt presents with remarkably strong swallow function despite four intubations and deconditioning.   There is adequate mastication; timely swallow initiation; good mobility of hyolaryngeal complex, leading to adequate airway closure for most consistencies and strong pharyngeal clearance (no residue post-swallow).  Impairments were limited to decreased laryngeal closure  during consumption of thin liquids, leading to trace silent aspiration.  A chin tuck adequately protected airway, but due to  pt's fluctuating MS, he was not able to consistently execute chin tuck.  For now, recommend initiating a dysphagia 3 diet with nectar-thick liquids; meds whole in puree.  Discussed results and recs with pt/wife, who verbalize understanding.  SLP will follow for safety and diet progression.  SLP Visit Diagnosis Dysphagia, pharyngeal phase (R13.13) Attention and concentration deficit following -- Frontal lobe and executive function deficit following -- Impact on safety and function Mild aspiration risk   CHL IP TREATMENT RECOMMENDATION 05/14/2017 Treatment Recommendations Therapy as outlined in treatment plan below   Prognosis 05/14/2017 Prognosis for Safe Diet Advancement Good Barriers to Reach Goals -- Barriers/Prognosis Comment -- CHL IP DIET RECOMMENDATION 05/14/2017 SLP Diet Recommendations Dysphagia 3 (Mech soft) solids;Nectar thick liquid Liquid Administration via Cup Medication Administration Whole meds with puree Compensations Slow rate;Small sips/bites;Minimize environmental distractions Postural Changes Seated upright at 90 degrees   CHL IP OTHER RECOMMENDATIONS 05/14/2017 Recommended Consults -- Oral Care Recommendations Oral care BID Other Recommendations Order thickener from pharmacy   CHL IP FOLLOW UP RECOMMENDATIONS 05/14/2017 Follow up Recommendations (No Data)   CHL IP FREQUENCY AND DURATION 05/14/2017 Speech Therapy Frequency (ACUTE ONLY) min 2x/week Treatment Duration 2 weeks      CHL IP ORAL PHASE 05/14/2017 Oral Phase WFL Oral - Pudding Teaspoon -- Oral - Pudding Cup -- Oral - Honey Teaspoon -- Oral - Honey Cup -- Oral - Nectar Teaspoon -- Oral - Nectar Cup -- Oral - Nectar Straw -- Oral - Thin Teaspoon -- Oral - Thin Cup -- Oral - Thin Straw -- Oral - Puree -- Oral - Mech Soft -- Oral - Regular -- Oral - Multi-Consistency -- Oral - Pill -- Oral Phase - Comment --  CHL IP PHARYNGEAL  PHASE 05/14/2017 Pharyngeal Phase Impaired Pharyngeal- Pudding Teaspoon -- Pharyngeal -- Pharyngeal- Pudding Cup -- Pharyngeal -- Pharyngeal- Honey Teaspoon -- Pharyngeal -- Pharyngeal- Honey Cup -- Pharyngeal -- Pharyngeal- Nectar Teaspoon -- Pharyngeal -- Pharyngeal- Nectar Cup WFL Pharyngeal -- Pharyngeal- Nectar Straw -- Pharyngeal -- Pharyngeal- Thin Teaspoon -- Pharyngeal -- Pharyngeal- Thin Cup Reduced airway/laryngeal closure;Trace aspiration;Penetration/Aspiration during swallow Pharyngeal Material enters airway, passes BELOW cords without attempt by patient to eject out (silent aspiration) Pharyngeal- Thin Straw -- Pharyngeal -- Pharyngeal- Puree WFL Pharyngeal -- Pharyngeal- Mechanical Soft -- Pharyngeal -- Pharyngeal- Regular WFL Pharyngeal -- Pharyngeal- Multi-consistency -- Pharyngeal -- Pharyngeal- Pill -- Pharyngeal -- Pharyngeal Comment --  CHL IP CERVICAL ESOPHAGEAL PHASE 05/14/2017 Cervical Esophageal Phase WFL Pudding Teaspoon -- Pudding Cup -- Honey Teaspoon -- Honey Cup -- Nectar Teaspoon -- Nectar Cup -- Nectar Straw -- Thin Teaspoon -- Thin Cup -- Thin Straw -- Puree -- Mechanical Soft -- Regular -- Multi-consistency -- Pill -- Cervical Esophageal Comment -- No flowsheet data found. Juan Quam [name] 05/14/2017, 1:00 PM               Cardiac Studies   none  Patient Profile     71 y.o. male s/p ascending aorta replacement, post op VT, VDRF.  Assessment & Plan    1. VT - I have reduce dose of amio to 200 daily due to slight transaminase elevation. 2. VDRF - his CXR is ok though looks a little more dyspneic to me. Consider additional IV lasix as EF is low. 3. Diarrhea - as per Dr. Servando Snare.  Signed, Cristopher Peru, MD  05/16/2017, 7:44 AM  Patient ID: Jack Joseph, male   DOB: 01-Nov-1946, 71 y.o.   MRN: [id number]

## 2017-05-16 NOTE — Progress Notes (Signed)
Physical Therapy Treatment Patient Details Name: Jack Joseph MRN: [id number] DOB: [birth year] Today's Date: 05/16/2017    History of Present Illness 71 y.o. male with aortic root replacement and CABG with VT arrest post op and VDRF with intubations x 3.  HCAP, Right chest tube with PTX.  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 Pt currently with functional limitations due to balance and endurance deficits. Pt incr distance with ambulation today still needing min assist but much less assist today. Followed with chair but did not need a sitting rest break.  O2 at 2L 86-90% which improved with pursed lip breathing.  Encouraged incentive spirometer.  Other VSS.  Pt will benefit from skilled PT to increase their independence and safety with mobility to allow discharge to the venue listed below.    Follow Up Recommendations  CIR;Supervision/Assistance - 24 hour     Equipment Recommendations  Other (comment) (TBA)    Recommendations for Other Services Rehab consult;OT consult     Precautions / Restrictions Precautions Precautions: Fall;Sternal Restrictions Weight Bearing Restrictions: No    Mobility  Bed Mobility Overal bed mobility: Needs Assistance Bed Mobility: Rolling;Sidelying to Sit Rolling: Min assist Sidelying to sit: Min assist;HOB elevated       General bed mobility comments: Pt was able to perform side to sit with slightly less assist today.   Transfers Overall transfer level: Needs assistance Equipment used: Rolling walker (2 wheeled) Transfers: Sit to/from Stand Sit to Stand: Min assist         General transfer comment: cues for sternal precautions and assist to power up into standing   Ambulation/Gait Ambulation/Gait assistance: Mod assist;+2 safety/equipment;Min assist Ambulation Distance (Feet): 170 Feet Assistive device: Rolling walker  (2 wheeled) Gait Pattern/deviations: Decreased step length - right;Decreased step length - left;Decreased stride length;Drifts right/left;Trunk flexed;Wide base of support   Gait velocity interpretation: Below normal speed for age/gender General Gait Details: Pt needed cues to stay close to RW at times.  STanding up much taller and needing much less cues.  Gait much more coordinated.  No assist to steer RW.  Pt tolerated walk much better today.     Stairs            Wheelchair Mobility    Modified Rankin (Stroke Patients Only)       Balance Overall balance assessment: Needs assistance Sitting-balance support: No upper extremity supported;Feet supported Sitting balance-Leahy Scale: Fair     Standing balance support: Bilateral upper extremity supported;During functional activity Standing balance-Leahy Scale: Poor Standing balance comment: reliant on UE support.  Pt able to statically stand with bil. UE support x 6 mins with min guard assist              High level balance activites: Direction changes;Turns High Level Balance Comments: Needing close guard assist with turns with RW as he tended to let RW get away from him.             Cognition Arousal/Alertness: Awake/alert Behavior During Therapy: WFL for tasks assessed/performed Overall Cognitive Status: Impaired/Different from baseline Area of Impairment: Attention;Memory;Problem solving;Awareness                   Current Attention Level: Selective Memory: Decreased recall of precautions;Decreased short-term memory     Awareness: Intellectual Problem Solving: Slow processing;Difficulty sequencing;Requires verbal cues  Exercises General Exercises - Lower Extremity Long Arc Quad: AROM;Both;10 reps;Seated    General Comments General comments (skin integrity, edema, etc.): VSS       Pertinent Vitals/Pain Pain Assessment: Faces Pain Score: 0-No pain Faces Pain Scale: No hurt    Home  Living Family/patient expects to be discharged to:: Inpatient rehab Living Arrangements: Spouse/significant other Available Help at Discharge: Family;Available 24 hours/day Type of Home: House Home Access: Stairs to enter Entrance Stairs-Rails: None Home Layout: One level Home Equipment: Engineer, production - single point;Grab bars - tub/shower Additional Comments: worked 6 hours day, 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    Prior Function Level of Independence: Independent          PT Goals (current goals can now be found in the care plan section) Acute Rehab PT Goals Patient Stated Goal: to go home Progress towards PT goals: Progressing toward goals    Frequency    Min 3X/week      PT Plan Current plan remains appropriate    Co-evaluation              AM-PAC PT "6 Clicks" Daily Activity  Outcome Measure  Difficulty turning over in bed (including adjusting bedclothes, sheets and blankets)?: A Little Difficulty moving from lying on back to sitting on the side of the bed? : A Little Difficulty sitting down on and standing up from a chair with arms (e.g., wheelchair, bedside commode, etc,.)?: A Little Help needed moving to and from a bed to chair (including a wheelchair)?: A Little Help needed walking in hospital room?: A Lot Help needed climbing 3-5 steps with a railing? : Total 6 Click Score: 15    End of Session Equipment Utilized During Treatment: Gait belt;Oxygen Activity Tolerance: Patient limited by fatigue Patient left: in chair;with call bell/phone within reach;with chair alarm set;with family/visitor present Nurse Communication: Mobility status PT Visit Diagnosis: Unsteadiness on feet (R26.81);Muscle weakness (generalized) (M62.81)     Time: 1110-1135 PT Time Calculation (min) (ACUTE ONLY): 25 min  Charges:  $Gait Training: 8-22 mins $Therapeutic Exercise: 8-22 mins                    G Codes:       Jack Joseph,PT Acute  Rehabilitation [phone] (pager)    Jack Joseph 05/16/2017, 2:03 PM

## 2017-05-16 NOTE — Progress Notes (Signed)
  Speech Language Pathology Treatment: Dysphagia  Patient Details Name: Jack Joseph MRN: [id number] DOB: 15-Jun-1946 Today's Date: 05/16/2017 Time: 1140-1200 SLP Time Calculation (min) (ACUTE ONLY): 20 min  Assessment / Plan / Recommendation Clinical Impression  Pt seen for dysphagia treatment- diet tolerance/ considering advancement of liquids. Pt reportedly with improved cognitive status. Was conversant at bedside and able to follow simple commands. Pt stated "sometimes I go off the wall"- is aware that cognitive status fluctuates. Voice quality reportedly improved slightly but continues to be hoarse with low vocal intensity. Pt tolerated nectar thick liquids without overt s/s of aspiration. Attempted thin liquid with chin tuck (per MBS)- pt held liquid in mouth for a few seconds before putting chin down, then had wet vocal quality. Additionally, pt and wife reported preferring to continue with nectar-thick liquids given fluctuations in cognition. Will continue dysphagia 3/ nectar-thick liquids with full supervision during meals. Will f/u to check status/ consider advancement as cognition improves.   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 Underwent ascending aortic replacement/wheat procedure with CABG. Was progressing well post op and ambulating in hall. However just after midnight 6/20 had VT arrest with CPR and shock x 3. Intubated during ACLS.  Right pneumothorax with chest tube x2. ETT 6/20-6/21; reintubated 6/21-6/24, reintubated 6/26-7/1.       SLP Plan  Continue with current plan of care       Recommendations  Diet recommendations: Dysphagia 3 (mechanical soft);Nectar-thick liquid Liquids provided via: Cup;Straw Medication Administration: Whole meds with puree Supervision: Patient able to self feed;Full  supervision/cueing for compensatory strategies Compensations: Slow rate;Small sips/bites Postural Changes and/or Swallow Maneuvers: Seated upright 90 degrees                Oral Care Recommendations: Oral care BID Follow up Recommendations: Inpatient Rehab SLP Visit Diagnosis: Dysphagia, pharyngeal phase (R13.13) Plan: Continue with current plan of care       GO                Metro Kung, MA, CCC-SLP 05/16/2017, 12:02 PM  [id number]

## 2017-05-16 NOTE — Progress Notes (Signed)
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  Underwent ascending aortic replacement/wheat procedure with CABG.  Was progressing well post op and ambulating in hall.  However just after midnight 6/20 had VT arrest with CPR and shock x 3.  Intubated during ACLS, small R ptx noted post CPR and CT placed by CVTS.  PCCM consulted for vent management.   SUBJECTIVE:  no acute events. LFTs trending down. Amio decreased as well. LFTs decreasing before dose dropped. No complaints  VITAL SIGNS: BP 111/60   Pulse 93   Temp 98.6 F (37 C) (Oral)   Resp 15   Ht 5\' 10"  (1.778 m)   Wt 67.3 kg (148 lb 5.9 oz)   SpO2 (!) 88%   BMI 21.29 kg/m   HEMODYNAMICS:    VENTILATOR SETTINGS:    INTAKE / OUTPUT: I/O last 3 completed shifts: In: 1508.7 [P.O.:230; I.V.:1178.7; IV Piggyback:100] Out: 7829 [FAOZH:0865; Stool:250]  PHYSICAL EXAMINATION:  General:  Frail elderly male in NAD Neuro:  Alert, oriented, non-focal. Some confusion/delirium.  HEENT:  [place]/AT, No JVD noted, PERRL Cardiovascular:  RRR, no MRG Lungs:  Upper airway secretions otherwise clear Abdomen:  Soft, non-distended, non-tender Musculoskeletal:  No acute deformity. Weak extremities Skin:  Intact, MMM   LABS:  BMET  Recent Labs Lab 05/13/17 0415  05/15/17 0357 05/15/17 1716 05/16/17 0415  NA 139  < > 142 141 141  K 4.1  < > 3.9 3.8 3.4*  CL 106  < > 108 105 108  CO2 27  --  26  --  25  BUN 22*  < > 16 20 24*  CREATININE 0.71  < > 0.71 0.70 0.87  GLUCOSE 129*  < > 99 96 111*  <  > = values in this interval not displayed.  Electrolytes  Recent Labs Lab 05/12/17 0432 05/13/17 0415 05/15/17 0040 05/15/17 0357 05/16/17 0415  CALCIUM 8.0* 8.2*  --  8.3* 8.3*  MG 2.3  --  2.1  --   --   PHOS 4.0  --   --   --   --     CBC  Recent Labs Lab 05/14/17 0843  05/15/17 0357 05/15/17 1716 05/16/17 0415  WBC 16.6*  --  13.4*  --  10.3  HGB 9.0*  < > 8.4* 9.2* 8.9*  HCT 29.2*  < > 27.6* 27.0* 28.9*  PLT 767*  --  818*  --  851*  < > = values in this interval not displayed.  Coag's  Recent Labs Lab 05/14/17 0426 05/15/17 0357 05/16/17 0415  INR 1.39 1.49 1.72   Sepsis Markers  Recent Labs Lab 05/10/17 1158 05/11/17 0343 05/12/17 0432  PROCALCITON 0.32 0.27 0.25   ABG  Recent Labs Lab 05/12/17 0225 05/13/17 0425 05/14/17 0320  PHART 7.419 7.402 7.431  PCO2ART 42.3 43.4 41.0  PO2ART 76.6* 54.8* 69.3*   Liver Enzymes  Recent Labs Lab 05/13/17 0415 05/15/17 0357  AST 70* 28  ALT 125* 67*  ALKPHOS 170* 140*  BILITOT 0.7 0.7  ALBUMIN 1.8* 1.9*  Cardiac Enzymes No results for input(s): TROPONINI, PROBNP in the last 168 hours.  Glucose  Recent Labs Lab 05/14/17 1211 05/14/17 1930 05/14/17 2345 05/15/17 0335 05/15/17 0732 05/16/17 0758  GLUCAP 120* 98 99 86 93 92   Imaging CXR 6/26> Left lung opacification, extensive SQ emphysema. Small right pneumothorax I have reviewed the images personally  STUDIES:  2D echo 6/20 > EF 10-15%, G1DD, mod MR, mild LA and RA dilation, severe reduction in RV function, mild-mod TR, left pleural effusion.  CULTURES: Sputum 6/24 >>> Normal resp flora Bronch washings 6/26 >> no growth  ANTIBIOTICS: Vanc 6/24 > 6/28 Zosyn 6/24 > stop date 7/3  SIGNIFICANT EVENTS: 6/21 Extubated, reintubated 6/24 Extubated 6/26 Bronch, reintubated 7/1 Extubated 7/3 LFT bump, PCCM called back 7/4 LFT Improving  LINES/TUBES: ETT 6/20>>> 6/21; 6/21 >> 6/24; 6/26>> 7/1 R chest tube #1 6/20>>> R  chest tube #2 6/21 >>   ASSESSMENT / PLAN: 71yo male with VT arrest POD#9 thoracic aortic aneurysm repair.  Suffered VT arrest, recurrent resp failure secondary to secretions, rt pneumothorax and LLL HCAP.  Acute respiratory failure - post VT arrest.  Extubated 7/1 HCAP improved Severe left-sided atelectasis with mucus plugging Plan: Continue nebs, pulmonary toilete CT management per CVTS  Possible HCAP Plan 10 days ABX complete  VT arrest 6/20 - unclear etiology.  Acute on chronic CHF - EF 10-15%; Shock - cardiogenic.   Repair thoracic aortic aneurysm 6/18 Hx AVR  Plan Heparin anticoagulation (heparin and currently transitioning to warfarin),  Amiodarone decreased by EP in light of LFT elevation  Increased LFT's - ? amio toxicity vs shock liver Plan: Trending down, ALT still mildly elevated. Amio decreased to daily. Continue to trend, suspect this will continue to improve.   Protein calorie malnutrition.  Had swallow eval 7/2, felt he needs MBS (planned for today 7/3). Plan Continue tube feeds NPO until MBS completed  Hyperglycemia   Plan:   SSI coverage  ICU delirium - improving Plan:   Avoid sedatives  FAMILY  - Updates:  Patient updated  PCCM will sign off.   Joneen Roach, AGACNP-BC [facility] Pager [phone] or [phone]  Attending Note:  71 year old male s/p thoracic aneurysm repair and PTX requiring chest tubes that were removed. I reviewed CXR myself, no acute disease noted. On exam, lungs are clear bilaterally.  Discussed with PCCM-NP.  Acute respiratory failure: - Monitor for airway protection - Ambulate as able  Hypoxemia: - Titrate O2 for sat of 88-92% down to 1.5 liters, hope is to titrate to off  - May need an ambulatory desat study prior to discharge to qualify for home O2 if continues to be hypoxemic  ICU delirium - Minimize  sedation - Ambulate - PT  Hyperglycemia: - ISS - CBG  Elevated LFTs, concern for amiodarone vs shocked liver.             - LFT normalized, no need for further             - No further interventions needed.  PCCM will signoff, please call back if needed.  Patient seen and examined, agree with above note. I dictated the care and orders written for this patient under my direction.  Alyson Reedy, MD [phone]  05/16/2017 8:37 AM

## 2017-05-16 NOTE — Progress Notes (Signed)
Orders for safety sitter placed d/t pt's frequent attempts to get out of bed despite numerous other interventions (frequent verbal cues, bed alarm, etc.). Pt disoriented to situation, impulsive, forgetful, so verbal cues are not sufficient for pt's safety.  Will continue to monitor closely.

## 2017-05-16 NOTE — Progress Notes (Signed)
Rehab admissions - I met briefly with patient's wife and gave her rehab booklets to review.  I will follow up in am again.  Call me for questions.  #[phone]

## 2017-05-16 NOTE — Progress Notes (Signed)
Patient ID: Jack Joseph, male   DOB: 1946/04/01, 71 y.o.   MRN: [id number] [facility] DAILY ICU PROGRESS NOTE                   [address]            [place] [id number]          [phone]   17 Days Post-Op Procedure(s) (LRB): REDO STERNOTOMY (N/A) REPLACEMENT ASCENDING AORTA/ WHEAT PROCEDURE, HYPOTHERMIC CIRCULATORY ARREST AND CARDIOPULMONARY BYPASS, AND USE OF HEMASHIELD PLATINUM WOVEN DOUBLE VELOUR VASCULAR GRAFT 32 MM X 50 CM (N/A) TRANSESOPHAGEAL ECHOCARDIOGRAM (TEE) (N/A)  Total Length of Stay:  LOS: 17 days   Subjective: Still confused   Objective: Vital signs in last 24 hours: Temp:  [97.2 F (36.2 C)-98.6 F (37 C)] 98.6 F (37 C) (07/05 0700) Pulse Rate:  [32-116] 93 (07/05 0700) Cardiac Rhythm: Normal sinus rhythm (07/05 0400) Resp:  [15-28] 15 (07/05 0700) BP: (89-128)/(50-89) 111/60 (07/05 0700) SpO2:  [85 %-100 %] 88 % (07/05 0815) Weight:  [148 lb 5.9 oz (67.3 kg)] 148 lb 5.9 oz (67.3 kg) (07/05 0500)  Filed Weights   05/14/17 0500 05/15/17 0500 05/16/17 0500  Weight: 149 lb 4 oz (67.7 kg) 155 lb 3.3 oz (70.4 kg) 148 lb 5.9 oz (67.3 kg)    Weight change: -6 lb 13.4 oz (-3.1 kg)   Hemodynamic parameters for last 24 hours:    Intake/Output from previous day: 07/04 0701 - 07/05 0700 In: 863.2 [P.O.:50; I.V.:713.2; IV Piggyback:100] Out: 1157 [Urine:1275; Stool:250]  Intake/Output this shift: No intake/output data recorded.  Current Meds: Scheduled Meds: . amiodarone  200 mg Oral Daily  . chlorhexidine gluconate (MEDLINE KIT)  15 mL Mouth Rinse BID  . Chlorhexidine Gluconate Cloth  6 each Topical Daily  . feeding supplement (ENSURE ENLIVE)  237 mL Oral BID BM  . levalbuterol  0.63 mg Nebulization TID  . magic mouthwash  5 mL Oral TID  . mouth rinse  15 mL Mouth Rinse BID  . pantoprazole  40 mg Oral Daily  . sodium chloride flush  10-40 mL Intracatheter Q12H  . sodium chloride flush  3 mL Intravenous Q12H  . warfarin  4 mg  Oral q1800  . Warfarin - Physician Dosing Inpatient   Does not apply q1800   Continuous Infusions: . sodium chloride 10 mL/hr at 05/16/17 0700  . heparin 2,000 Units/hr (05/16/17 0700)  . norepinephrine (LEVOPHED) Adult infusion Stopped (05/12/17 1745)  . potassium chloride Stopped (05/16/17 0759)   PRN Meds:.diphenhydrAMINE, guaiFENesin, levalbuterol, ondansetron (ZOFRAN) IV, oxyCODONE, sodium chloride flush, sodium chloride flush  General appearance: alert, cooperative and but confused  Neurologic: intact Heart: ejection click present Lungs: diminished breath sounds bibasilar Abdomen: soft, non-tender; bowel sounds normal; no masses,  no organomegaly Extremities: extremities normal, atraumatic, no cyanosis or edema and Homans sign is negative, no sign of DVT Wound: sternum intact and without evidence of infection   Lab Results: CBC: Recent Labs  05/15/17 0357 05/15/17 1716 05/16/17 0415  WBC 13.4*  --  10.3  HGB 8.4* 9.2* 8.9*  HCT 27.6* 27.0* 28.9*  PLT 818*  --  851*   BMET:  Recent Labs  05/15/17 0357 05/15/17 1716 05/16/17 0415  NA 142 141 141  K 3.9 3.8 3.4*  CL 108 105 108  CO2 26  --  25  GLUCOSE 99 96 111*  BUN 16 20 24*  CREATININE 0.71 0.70 0.87  CALCIUM 8.3*  --  8.3*    CMET: Lab Results  Component Value Date   WBC 10.3 05/16/2017   HGB 8.9 (L) 05/16/2017   HCT 28.9 (L) 05/16/2017   PLT 851 (H) 05/16/2017   GLUCOSE 111 (H) 05/16/2017   CHOL 199 01/22/2017   TRIG 71 01/22/2017   HDL 64 01/22/2017   LDLCALC 121 (H) 01/22/2017   ALT 67 (H) 05/15/2017   AST 28 05/15/2017   NA 141 05/16/2017   K 3.4 (L) 05/16/2017   CL 108 05/16/2017   CREATININE 0.87 05/16/2017   BUN 24 (H) 05/16/2017   CO2 25 05/16/2017   TSH 4.065 05/09/2017   INR 1.72 05/16/2017   HGBA1C 5.7 (H) 04/25/2017      PT/INR:  Recent Labs  05/16/17 0415  LABPROT 20.3*  INR 1.72   Radiology: Dg Chest Port 1 View  Result Date: 05/16/2017 CLINICAL DATA:  71 year old  male postoperative day 17 status post cardiothoracic surgery, replacement of ascending aorta/Wheat procedure. Right chest tube removal yesterday. EXAM: PORTABLE CHEST 1 VIEW COMPARISON:  05/15/2017 and earlier. FINDINGS: Portable AP semi upright view at 0611 hours. Mild volume of bilateral upper chest and supraclavicular subcutaneous emphysema is stable. Skin fold artifact. No pneumothorax identified. Stable right PICC line.  Epicardial pacer leads remain in place. Stable cardiac size and mediastinal contours. Left lower lobe collapse or consolidation with small left pleural effusion. No pulmonary edema. Stable ventilation since yesterday. IMPRESSION: 1. No pneumothorax or new cardiopulmonary abnormality identified. 2. Stable left lower lobe collapse or consolidation and probable small left pleural effusion. Electronically Signed   By: Genevie Ann M.D.   On: 05/16/2017 07:24   Dg Chest Port 1 View  Result Date: 05/15/2017 CLINICAL DATA:  Chest tube removal this morning. EXAM: PORTABLE CHEST 1 VIEW COMPARISON:  Most recent radiographs earlier this day. Chest CT 05/07/2017 FINDINGS: Right pigtail catheter is been removed. Previous tiny apical pneumothorax is tentatively identified and unchanged. No progression. Right upper extremity PICC tip in the mid SVC. Linear peripheral opacity in the right midlung may be minimal fluid in the fissure or atelectasis, this appears along the course of prior chest tube. Stable cardiomegaly, median sternotomy and prosthetic valve. Stable retrocardiac opacity in left pleural effusion. Subcutaneous emphysema again seen, not significantly changed. IMPRESSION: 1. Removal of right pigtail catheter. Unchanged tiny right apical pneumothorax from exam earlier this day. 2. Minimal developing linear opacity in the right mid lung may be pleural fluid in the fissure or atelectasis/scarring, may be along the course of prior chest tube. 3. Unchanged left lung base opacity and pleural effusion.  Electronically Signed   By: Jeb Levering M.D.   On: 05/15/2017 20:01     Assessment/Plan: S/P Procedure(s) (LRB): REDO STERNOTOMY (N/A) REPLACEMENT ASCENDING AORTA/ WHEAT PROCEDURE, HYPOTHERMIC CIRCULATORY ARREST AND CARDIOPULMONARY BYPASS, AND USE OF HEMASHIELD PLATINUM WOVEN DOUBLE VELOUR VASCULAR GRAFT 32 MM X 50 CM (N/A) TRANSESOPHAGEAL ECHOCARDIOGRAM (TEE) (N/A) frequent foul smelling stools started last night , off stool softer taking diet ok, abdomen soft - check cdiff  Off antbiotics currently, wbc decreasing  inr 1.7 on coumadin 4 mg daily dose increased last night  Pt to mobilize  Chest ubes are out    [name] 05/16/2017 8:17 AM

## 2017-05-16 NOTE — Consult Note (Signed)
Physical Medicine and Rehabilitation Consult  Reason for Consult: Debility Referring Physician: Dr. Tyrone Sage.    HPI: Jack Joseph is a 71 y.o. male with history of CAD s/pCABG, chronic systolic CHF s/p AVR '83- chronic coumadin, COPD who was admitted on 04/29/17 for redo sternotomy and repair of ascending aortic dilation/Wheat procedure.  Post op course complicated by VT with PEA arrest requiring CPR/ACLS protocol and intubation, R-PTX with large amount of subcutaneous emphysema right lateral chest, hypotension requiring pressors, severe left atelectasis with mucous plugging requiring reintubation and hypoxemia. Dr. Ladona Ridgel consulted for input and anticipated ICD insertion prior to discharge to home.  He tolerated extubation on 7/1 and was started on dysphagia 3, nectar liquids on 7/3 due to trace aspiration of thins. PT evaluation done yesterday revealing deconditioned state with significant decline in mobility. CIR recommended for follow up therapy.     Review of Systems  HENT: Negative for hearing loss and tinnitus.   Eyes: Negative for blurred vision and double vision.  Respiratory: Negative for cough and shortness of breath.   Cardiovascular: Negative for chest pain, palpitations and leg swelling.  Gastrointestinal: Positive for diarrhea. Negative for heartburn and nausea.  Genitourinary: Negative for dysuria and urgency.  Musculoskeletal: Positive for joint pain (chronic left knee pain). Negative for myalgias.  Neurological: Positive for speech change and weakness. Negative for dizziness, sensory change, focal weakness and headaches.  Psychiatric/Behavioral: Positive for memory loss.      Past Medical History:  Diagnosis Date  . AVD (aortic valve disease)   . Chronic anticoagulation   . Dilated cardiomyopathy (HCC)    EF 30-35%  . Dysrhythmia   . Hyperlipidemia   . LV dysfunction     Past Surgical History:  Procedure Laterality Date  . AORTIC VALVE REPLACEMENT     . BENTALL PROCEDURE N/A 04/29/2017   Procedure: REPLACEMENT ASCENDING AORTA/ WHEAT PROCEDURE, HYPOTHERMIC CIRCULATORY ARREST AND CARDIOPULMONARY BYPASS, AND USE OF HEMASHIELD PLATINUM WOVEN DOUBLE VELOUR VASCULAR GRAFT 32 MM X 50 CM;  Surgeon: [name], MD;  Location: [facility] OR;  Service: Open Heart Surgery;  Laterality: N/A;  . DOPPLER ECHOCARDIOGRAPHY  04/07/2002   EF 30-35%  . RIGHT/LEFT HEART CATH AND CORONARY ANGIOGRAPHY N/A 03/05/2017   Procedure: Right/Left Heart Cath and Coronary Angiography;  Surgeon: Peter M Swaziland, MD;  Location: [facility] INVASIVE CV LAB;  Service: Cardiovascular;  Laterality: N/A;  . TEE WITHOUT CARDIOVERSION N/A 04/29/2017   Procedure: TRANSESOPHAGEAL ECHOCARDIOGRAM (TEE);  Surgeon: [name], MD;  Location: [facility] OR;  Service: Open Heart Surgery;  Laterality: N/A;    Family History  Problem Relation Age of Onset  . Heart disease Brother     Social History:  Married. Independent and was working  6 hours/5 days a week for [employer]. He reports that he quit smoking about 16 years ago. He has never used smokeless tobacco. He reports that he does not drink alcohol or use drugs.    Allergies  Allergen Reactions  . No Known Allergies     Medications Prior to Admission  Medication Sig Dispense Refill  . carvedilol (COREG) 3.125 MG tablet Take 2 tablets (6.25 mg total) by mouth 2 (two) times daily. 180 tablet 3  . colchicine 0.6 MG tablet Take 0.6 mg by mouth daily as needed.    . enoxaparin (LOVENOX) 60 MG/0.6ML injection Inject 0.6 mLs (60 mg total) into the skin every 12 (twelve) hours. 20 Syringe 0  . losartan (COZAAR)  25 MG tablet Take 1 tablet (25 mg total) by mouth daily. 30 tablet 11  . methylcellulose (CITRUCEL) oral powder Take 1 packet by mouth 2 (two) times daily.     . sildenafil (REVATIO) 20 MG tablet TAKE 1-5 TABLETS BY MOUTH AS NEEDED FOR SEXUAL ACTIVITY (Patient not taking: Reported on 04/25/2017) 50 tablet 1  . warfarin (COUMADIN)  5 MG tablet Take 1 tablet by mouth daily or as directed by [facility] (Patient not taking: Reported on 04/25/2017) 90 tablet 1    Home: Home Living Family/patient expects to be discharged to:: Private residence Living Arrangements: Spouse/significant other Available Help at Discharge: Family, Available 24 hours/day Type of Home: House Home Access: Stairs to enter [employer] of Steps: 3 Entrance Stairs-Rails: None Home Layout: One level Bathroom Shower/Tub: Engineer, manufacturing systems: Standard Bathroom Accessibility: Yes Home Equipment: Shower seat, Crutches, Cane - single point, Grab bars - tub/shower Additional Comments: worked 6 hours day, 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  Functional History: Prior Function Level of Independence: Independent Functional Status:  Mobility: Bed Mobility Overal bed mobility: Needs Assistance Bed Mobility: Rolling, Sidelying to Sit Rolling: Min assist Sidelying to sit: Mod assist General bed mobility comments: Needed cues for sternal precautions as well as assist for elevation of trunk.  Pt hugging pillow.   Transfers Overall transfer level: Needs assistance Equipment used: Rolling walker (2 wheeled) Transfers: Sit to/from Stand, [employer] Transfers Sit to Stand: Mod assist, +2 safety/equipment, From elevated surface Stand pivot transfers: Min assist, +2 physical assistance General transfer comment: Pt said he had to have BM once he sat up therefore obtained 3N1 and assisted pt onto 3N1 with assist as above with pt needing assist to power up and steadying assist as he is uncoordinated.  Pt with BM as he was transferring but only a small amount.  Loose stool.  Needed same assist to stand to RW once done and total assist to clean pt of BM.   Ambulation/Gait Ambulation/Gait assistance: Mod assist, +2 safety/equipment Ambulation Distance (Feet): 100 Feet (50 feet x 2) Assistive device: Rolling walker (2  wheeled) Gait Pattern/deviations: Decreased step length - right, Decreased step length - left, Decreased stride length, Shuffle, Leaning posteriorly, Drifts right/left, Trunk flexed, Wide base of support General Gait Details: Pt needed cues to stand tall and stay close to RW.  Slightly flexed and had difficulty with upright posture needing assist and cues throghout.  Uncoordinated gait at times.  Assist to steer RW as well.   Gait velocity interpretation: Below normal speed for age/gender    ADL:    Cognition: Cognition Overall Cognitive Status: Within Functional Limits for tasks assessed Orientation Level: Oriented to person, Oriented to time, Oriented to place, Disoriented to situation Cognition Arousal/Alertness: Awake/alert Behavior During Therapy: Anxious Overall Cognitive Status: Within Functional Limits for tasks assessed   Blood pressure 111/60, pulse 93, temperature 98.6 F (37 C), temperature source Oral, resp. rate 15, height 5\' 10"  (1.778 m), weight 67.3 kg (148 lb 5.9 oz), SpO2 93 %. Physical Exam  Nursing note and vitals reviewed. Constitutional: He appears well-developed. No distress.  Thin adult male with sitter in the room. Congest oral sounds with hoarse voice.  HENT:  Head: Normocephalic and atraumatic.  Eyes: Conjunctivae and EOM are normal. Pupils are equal, round, and reactive to light.  Neck: Normal range of motion. Neck supple.  Cardiovascular: Normal rate and regular rhythm.   Midline incision clean,dry and intact. Pacer wires in place.  Respiratory: Effort normal. No stridor. No respiratory distress. He has decreased breath sounds in the right lower field and the left lower field. He has no wheezes. He exhibits no tenderness.  GI: Soft. Bowel sounds are normal. He exhibits no distension. There is no tenderness.  Musculoskeletal: He exhibits no edema or tenderness.  Neurological: He is alert.  Wet dysphonic voice. Disoriented initially but started clearing  up with cues. He was able to recall date and follow basic commands without difficulty. Memory deficits and lack of awareness noted. UE 3/5 prox to distal. LE: 3-/5 HF, KE and 4/5 ADF/PF. Senses pain in all 4's.   Skin: Skin is warm and dry. He is not diaphoretic.  Psychiatric: His mood appears anxious. Cognition and memory are impaired. He expresses inappropriate judgment.    Results for orders placed or performed during the hospital encounter of 04/29/17 (from the past 24 hour(s))  Heparin level (unfractionated)     Status: None   Collection Time: 05/15/17  1:00 PM  Result Value Ref Range   Heparin Unfractionated 0.59 0.30 - 0.70 IU/mL  I-STAT, chem 8     Status: Abnormal   Collection Time: 05/15/17  5:16 PM  Result Value Ref Range   Sodium 141 135 - 145 mmol/L   Potassium 3.8 3.5 - 5.1 mmol/L   Chloride 105 101 - 111 mmol/L   BUN 20 6 - 20 mg/dL   Creatinine, Ser 1.61 0.61 - 1.24 mg/dL   Glucose, Bld 96 65 - 99 mg/dL   Calcium, Ion 0.96 1.15 - 1.40 mmol/L   TCO2 26 0 - 100 mmol/L   Hemoglobin 9.2 (L) 13.0 - 17.0 g/dL   HCT 04.5 (L) 40.9 - 81.1 %  Protime-INR     Status: Abnormal   Collection Time: 05/16/17  4:15 AM  Result Value Ref Range   Prothrombin Time 20.3 (H) 11.4 - 15.2 seconds   INR 1.72   CBC     Status: Abnormal   Collection Time: 05/16/17  4:15 AM  Result Value Ref Range   WBC 10.3 4.0 - 10.5 K/uL   RBC 3.02 (L) 4.22 - 5.81 MIL/uL   Hemoglobin 8.9 (L) 13.0 - 17.0 g/dL   HCT 91.4 (L) 78.2 - 95.6 %   MCV 95.7 78.0 - 100.0 fL   MCH 29.5 26.0 - 34.0 pg   MCHC 30.8 30.0 - 36.0 g/dL   RDW 21.3 08.6 - 57.8 %   Platelets 851 (H) 150 - 400 K/uL  Basic metabolic panel     Status: Abnormal   Collection Time: 05/16/17  4:15 AM  Result Value Ref Range   Sodium 141 135 - 145 mmol/L   Potassium 3.4 (L) 3.5 - 5.1 mmol/L   Chloride 108 101 - 111 mmol/L   CO2 25 22 - 32 mmol/L   Glucose, Bld 111 (H) 65 - 99 mg/dL   BUN 24 (H) 6 - 20 mg/dL   Creatinine, Ser 4.69 0.61 -  1.24 mg/dL   Calcium 8.3 (L) 8.9 - 10.3 mg/dL   GFR calc non Af Amer >60 >60 mL/min   GFR calc Af Amer >60 >60 mL/min   Anion gap 8 5 - 15  Heparin level (unfractionated)     Status: None   Collection Time: 05/16/17  4:32 AM  Result Value Ref Range   Heparin Unfractionated 0.41 0.30 - 0.70 IU/mL  Glucose, capillary     Status: None   Collection Time: 05/16/17  7:58 AM  Result  Value Ref Range   Glucose-Capillary 92 65 - 99 mg/dL   Comment 1 Venous Specimen    Dg Chest Port 1 View  Result Date: 05/16/2017 CLINICAL DATA:  71 year old male postoperative day 17 status post cardiothoracic surgery, replacement of ascending aorta/Wheat procedure. Right chest tube removal yesterday. EXAM: PORTABLE CHEST 1 VIEW COMPARISON:  05/15/2017 and earlier. FINDINGS: Portable AP semi upright view at 0611 hours. Mild volume of bilateral upper chest and supraclavicular subcutaneous emphysema is stable. Skin fold artifact. No pneumothorax identified. Stable right PICC line.  Epicardial pacer leads remain in place. Stable cardiac size and mediastinal contours. Left lower lobe collapse or consolidation with small left pleural effusion. No pulmonary edema. Stable ventilation since yesterday. IMPRESSION: 1. No pneumothorax or new cardiopulmonary abnormality identified. 2. Stable left lower lobe collapse or consolidation and probable small left pleural effusion. Electronically Signed   By: Odessa Fleming M.D.   On: 05/16/2017 07:24   Dg Chest Port 1 View  Result Date: 05/15/2017 CLINICAL DATA:  Chest tube removal this morning. EXAM: PORTABLE CHEST 1 VIEW COMPARISON:  Most recent radiographs earlier this day. Chest CT 05/07/2017 FINDINGS: Right pigtail catheter is been removed. Previous tiny apical pneumothorax is tentatively identified and unchanged. No progression. Right upper extremity PICC tip in the mid SVC. Linear peripheral opacity in the right midlung may be minimal fluid in the fissure or atelectasis, this appears along the  course of prior chest tube. Stable cardiomegaly, median sternotomy and prosthetic valve. Stable retrocardiac opacity in left pleural effusion. Subcutaneous emphysema again seen, not significantly changed. IMPRESSION: 1. Removal of right pigtail catheter. Unchanged tiny right apical pneumothorax from exam earlier this day. 2. Minimal developing linear opacity in the right mid lung may be pleural fluid in the fissure or atelectasis/scarring, may be along the course of prior chest tube. 3. Unchanged left lung base opacity and pleural effusion. Electronically Signed   By: Rubye Oaks M.D.   On: 05/15/2017 20:01   Dg Chest Port 1 View  Result Date: 05/15/2017 CLINICAL DATA:  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 Right chest tube in place. EXAM: PORTABLE CHEST 1 VIEW COMPARISON:  Single-view of the chest 05/14/2017 and 05/13/2017. FINDINGS: Small bore right chest tube remains in place. There is a tiny right apical pneumothorax, unchanged. Subcutaneous emphysema is seen over the chest. Left pleural effusion appears slightly decreased compared to yesterday's examination. Left basilar airspace disease is unchanged. Prosthetic aortic valve and intact median sternotomy wires are unchanged. IMPRESSION: Tiny right apical pneumothorax with a chest tube in place, unchanged. Slight decrease in a left pleural effusion. Left basilar airspace disease unchanged. Electronically Signed   By: Drusilla Kanner M.D.   On: 05/15/2017 08:45   Dg Chest Port 1 View  Result Date: 05/14/2017 CLINICAL DATA:  Status post chest tube removal EXAM: PORTABLE CHEST 1 VIEW COMPARISON:  05/13/2017 FINDINGS: Cardiac shadow is stable. Postoperative changes are again seen. Moderate left-sided pleural effusion is again noted. The large bore chest tube on the right has been removed in the interval. Stable apical right pneumothorax is seen. Small bore chest tube remains in place. A right-sided PICC line is noted  and stable. Feeding catheter has been removed. IMPRESSION: Stable right apical pneumothorax following chest tube removal. Stable moderate left pleural effusion. Tubes and lines as described. Electronically Signed   By: Alcide Clever M.D.   On: 05/14/2017 08:44   Dg Swallowing Func-speech Pathology  Result Date: 05/14/2017 Objective Swallowing Evaluation: Type of  Study: MBS-Modified Barium Swallow Study Patient Details Name: Jack Joseph MRN: [id number] Date of Birth: [birth year] Today's Date: 05/14/2017 Time: SLP Start Time (ACUTE ONLY): 0930-SLP Stop Time (ACUTE ONLY): 1030 SLP Time Calculation (min) (ACUTE ONLY): 60 min Past Medical History: Past Medical History: Diagnosis Date . AVD (aortic valve disease)  . Chronic anticoagulation  . Dilated cardiomyopathy (HCC)   EF 30-35% . Dysrhythmia  . Hyperlipidemia  . LV dysfunction  Past Surgical History: Past Surgical History: Procedure Laterality Date . AORTIC VALVE REPLACEMENT   . BENTALL PROCEDURE N/A 04/29/2017  Procedure: REPLACEMENT ASCENDING AORTA/ WHEAT PROCEDURE, HYPOTHERMIC CIRCULATORY ARREST AND CARDIOPULMONARY BYPASS, AND USE OF HEMASHIELD PLATINUM WOVEN DOUBLE VELOUR VASCULAR GRAFT 32 MM X 50 CM;  Surgeon: [name], MD;  Location: [facility] OR;  Service: Open Heart Surgery;  Laterality: N/A; . DOPPLER ECHOCARDIOGRAPHY  04/07/2002  EF 30-35% . RIGHT/LEFT HEART CATH AND CORONARY ANGIOGRAPHY N/A 03/05/2017  Procedure: Right/Left Heart Cath and Coronary Angiography;  Surgeon: Peter M Swaziland, MD;  Location: [facility] INVASIVE CV LAB;  Service: Cardiovascular;  Laterality: N/A; . TEE WITHOUT CARDIOVERSION N/A 04/29/2017  Procedure: TRANSESOPHAGEAL ECHOCARDIOGRAM (TEE);  Surgeon: [name], MD;  Location: [facility] OR;  Service: Open Heart Surgery;  Laterality: N/A; HPI: 71 yo male former smoker with hx remote AVR 1983 on chronic coumadin, COPD, LV dysfunction with EF 30-35%, dilated ascending aorta who initially presented 6/18 for elective surgical repair.  Underwent ascending aortic replacement/wheat procedure with CABG. Was progressing well post op and ambulating in hall. However just after midnight 6/20 had VT arrest with CPR and shock x 3. Intubated during ACLS.  Right pneumothorax with chest tube x2. ETT 6/20-6/21; reintubated 6/21-6/24, reintubated 6/26-7/1.  Subjective: alert, participatory Assessment / Plan / Recommendation CHL IP CLINICAL IMPRESSIONS 05/14/2017 Clinical Impression Pt presents with remarkably strong swallow function despite four intubations and deconditioning.   There is adequate mastication; timely swallow initiation; good mobility of hyolaryngeal complex, leading to adequate airway closure for most consistencies and strong pharyngeal clearance (no residue post-swallow).  Impairments were limited to decreased laryngeal closure during consumption of thin liquids, leading to trace silent aspiration.  A chin tuck adequately protected airway, but due to pt's fluctuating MS, he was not able to consistently execute chin tuck.  For now, recommend initiating a dysphagia 3 diet with nectar-thick liquids; meds whole in puree.  Discussed results and recs with pt/wife, who verbalize understanding.  SLP will follow for safety and diet progression.  SLP Visit Diagnosis Dysphagia, pharyngeal phase (R13.13) Attention and concentration deficit following -- Frontal lobe and executive function deficit following -- Impact on safety and function Mild aspiration risk   CHL IP TREATMENT RECOMMENDATION 05/14/2017 Treatment Recommendations Therapy as outlined in treatment plan below   Prognosis 05/14/2017 Prognosis for Safe Diet Advancement Good Barriers to Reach Goals -- Barriers/Prognosis Comment -- CHL IP DIET RECOMMENDATION 05/14/2017 SLP Diet Recommendations Dysphagia 3 (Mech soft) solids;Nectar thick liquid Liquid Administration via Cup Medication Administration Whole meds with puree Compensations Slow rate;Small sips/bites;Minimize environmental distractions  Postural Changes Seated upright at 90 degrees   CHL IP OTHER RECOMMENDATIONS 05/14/2017 Recommended Consults -- Oral Care Recommendations Oral care BID Other Recommendations Order thickener from pharmacy   CHL IP FOLLOW UP RECOMMENDATIONS 05/14/2017 Follow up Recommendations (No Data)   CHL IP FREQUENCY AND DURATION 05/14/2017 Speech Therapy Frequency (ACUTE ONLY) min 2x/week Treatment Duration 2 weeks      CHL IP ORAL PHASE 05/14/2017 Oral Phase WFL Oral - Pudding Teaspoon -- Oral -  Pudding Cup -- Oral - Honey Teaspoon -- Oral - Honey Cup -- Oral - Nectar Teaspoon -- Oral - Nectar Cup -- Oral - Nectar Straw -- Oral - Thin Teaspoon -- Oral - Thin Cup -- Oral - Thin Straw -- Oral - Puree -- Oral - Mech Soft -- Oral - Regular -- Oral - Multi-Consistency -- Oral - Pill -- Oral Phase - Comment --  CHL IP PHARYNGEAL PHASE 05/14/2017 Pharyngeal Phase Impaired Pharyngeal- Pudding Teaspoon -- Pharyngeal -- Pharyngeal- Pudding Cup -- Pharyngeal -- Pharyngeal- Honey Teaspoon -- Pharyngeal -- Pharyngeal- Honey Cup -- Pharyngeal -- Pharyngeal- Nectar Teaspoon -- Pharyngeal -- Pharyngeal- Nectar Cup WFL Pharyngeal -- Pharyngeal- Nectar Straw -- Pharyngeal -- Pharyngeal- Thin Teaspoon -- Pharyngeal -- Pharyngeal- Thin Cup Reduced airway/laryngeal closure;Trace aspiration;Penetration/Aspiration during swallow Pharyngeal Material enters airway, passes BELOW cords without attempt by patient to eject out (silent aspiration) Pharyngeal- Thin Straw -- Pharyngeal -- Pharyngeal- Puree WFL Pharyngeal -- Pharyngeal- Mechanical Soft -- Pharyngeal -- Pharyngeal- Regular WFL Pharyngeal -- Pharyngeal- Multi-consistency -- Pharyngeal -- Pharyngeal- Pill -- Pharyngeal -- Pharyngeal Comment --  CHL IP CERVICAL ESOPHAGEAL PHASE 05/14/2017 Cervical Esophageal Phase WFL Pudding Teaspoon -- Pudding Cup -- Honey Teaspoon -- Honey Cup -- Nectar Teaspoon -- Nectar Cup -- Nectar Straw -- Thin Teaspoon -- Thin Cup -- Thin Straw -- Puree -- Mechanical Soft -- Regular  -- Multi-consistency -- Pill -- Cervical Esophageal Comment -- No flowsheet data found. [name] [name] 05/14/2017, 1:00 PM               Assessment/Plan: Diagnosis: debility after multiple sternotomy/Wheat procedure 1. Does the need for close, 24 hr/day medical supervision in concert with the patient's rehab needs make it unreasonable for this patient to be served in a less intensive setting? Yes 2. Co-Morbidities requiring supervision/potential complications: CHF, HTN, encephalopathy 3. Due to bladder management, bowel management, safety, skin/wound care, disease management, medication administration, pain management and patient education, does the patient require 24 hr/day rehab nursing? Yes 4. Does the patient require coordinated care of a physician, rehab nurse, PT (1-2 hrs/day, 5 days/week), OT (1-2 hrs/day, 5 days/week) and SLP (1-2 hrs/day, 5 days/week) to address physical and functional deficits in the context of the above medical diagnosis(es)? Yes Addressing deficits in the following areas: balance, endurance, locomotion, strength, transferring, bowel/bladder control, bathing, dressing, feeding, grooming, toileting, cognition, speech, swallowing and psychosocial support 5. Can the patient actively participate in an intensive therapy program of at least 3 hrs of therapy per day at least 5 days per week? Yes 6. The potential for patient to make measurable gains while on inpatient rehab is excellent 7. Anticipated functional outcomes upon discharge from inpatient rehab are supervision and min assist  with PT, supervision and min assist with OT, modified independent and supervision with SLP. 8. Estimated rehab length of stay to reach the above functional goals is: 14-19 days 9. Anticipated D/C setting: Home 10. Anticipated post D/C treatments: HH therapy and Outpatient therapy 11. Overall Rehab/Functional Prognosis: excellent  RECOMMENDATIONS: This patient's condition is appropriate  for continued rehabilitative care in the following setting: CIR Patient has agreed to participate in recommended program. Yes Note that insurance prior authorization may be required for reimbursement for recommended care.  Comment: Rehab Admissions Coordinator to follow up.  Thanks,  Ranelle Oyster, MD, Earlie Counts, PA-C 05/16/2017

## 2017-05-16 NOTE — Evaluation (Signed)
Occupational Therapy Evaluation Patient Details Name: Jack Joseph MRN: [id number] DOB: 1946-10-10 Today's Date: 05/16/2017    History of Present Illness 71 y.o. male with aortic root replacement and CABG with VT arrest post op and VDRF with intubations x 3.  HCAP, Right chest tube with PTX.  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 He demonstrates the below listed deficits and will benefit from continued OT to maximize safety and independence with BADLs.  Pt presents to OT with generalized weakness, decreased activity tolerance, impaired balance, impaired cognition.  He currently requires mod A, overall with ADLs.   VSS throughout session.  Feel he would benefit from CIR to maximize his safety and independence with ADLs.  Will follow acutely.       Follow Up Recommendations  CIR;Supervision/Assistance - 24 hour    Equipment Recommendations  3 in 1 bedside commode    Recommendations for Other Services       Precautions / Restrictions Precautions Precautions: Fall;Sternal Restrictions Weight Bearing Restrictions: No      Mobility Bed Mobility               General bed mobility comments: up in chair   Transfers Overall transfer level: Needs assistance Equipment used: Rolling walker (2 wheeled) Transfers: Sit to/from Stand Sit to Stand: Min assist         General transfer comment: cues for sternal precautions and assist to power up into standing     Balance Overall balance assessment: Needs assistance Sitting-balance support: No upper extremity supported;Feet supported Sitting balance-Leahy Scale: Fair     Standing balance support: Bilateral upper extremity supported;During functional activity Standing balance-Leahy Scale: Poor Standing balance comment: reliant on UE support.  Pt able to statically stand with bil. UE support x 6 mins with min  guard assist                            ADL either performed or assessed with clinical judgement   ADL Overall ADL's : Needs assistance/impaired Eating/Feeding: Minimal assistance;Sitting Eating/Feeding Details (indicate cue type and reason): due to lines  Grooming: Wash/dry hands;Wash/dry face;Oral care;Supervision/safety;Sitting   Upper Body Bathing: Moderate assistance;Sitting   Lower Body Bathing: Moderate assistance;Sit to/from stand Lower Body Bathing Details (indicate cue type and reason): Pt able to don/doff socks with supervision  Upper Body Dressing : Moderate assistance;Sitting   Lower Body Dressing: Moderate assistance;Sit to/from stand   Toilet Transfer: Minimal assistance;+2 for safety/equipment;Ambulation;Comfort height toilet;BSC;RW   Toileting- Clothing Manipulation and Hygiene: Sit to/from stand;Moderate assistance       Functional mobility during ADLs: Minimal assistance;+2 for safety/equipment;Rolling walker General ADL Comments: Pt requires assist due to generalized weakness      Vision         Perception     Praxis      Pertinent Vitals/Pain Pain Assessment: Faces Faces Pain Scale: No hurt     Hand Dominance Right   Extremity/Trunk Assessment Upper Extremity Assessment Upper Extremity Assessment: Generalized weakness   Lower Extremity Assessment Lower Extremity Assessment: Defer to PT evaluation   Cervical / Trunk Assessment Cervical / Trunk Assessment: Kyphotic   Communication Communication Communication: Expressive difficulties (low volume )   Cognition Arousal/Alertness: Awake/alert Behavior During Therapy: WFL for tasks assessed/performed Overall Cognitive Status: Impaired/Different from baseline Area of Impairment: Attention;Memory;Problem solving;Awareness  Current Attention Level: Selective Memory: Decreased recall of precautions;Decreased short-term memory     Awareness:  Intellectual Problem Solving: Slow processing;Difficulty sequencing;Requires verbal cues     General Comments  VSS     Exercises     Shoulder Instructions      Home Living Family/patient expects to be discharged to:: Inpatient rehab Living Arrangements: Spouse/significant other Available Help at Discharge: Family;Available 24 hours/day Type of Home: House Home Access: Stairs to enter [employer] of Steps: 3 Entrance Stairs-Rails: None Home Layout: One level     Bathroom Shower/Tub: Chief Strategy Officer: Standard Bathroom Accessibility: Yes   Home Equipment: Engineer, production - single point;Grab bars - tub/shower   Additional Comments: worked 6 hours day, 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      Prior Functioning/Environment Level of Independence: Independent                 OT Problem List: Decreased strength;Decreased activity tolerance;Impaired balance (sitting and/or standing);Decreased safety awareness;Decreased cognition;Decreased knowledge of use of DME or AE;Decreased knowledge of precautions;Cardiopulmonary status limiting activity      OT Treatment/Interventions: Self-care/ADL training;Energy conservation;DME and/or AE instruction;Therapeutic activities;Cognitive remediation/compensation;Patient/family education;Balance training    OT Goals(Current goals can be found in the care plan section) Acute Rehab OT Goals Patient Stated Goal: to go home OT Goal Formulation: With patient Time For Goal Achievement: 05/30/17 Potential to Achieve Goals: Good ADL Goals Pt Will Perform Grooming: with min guard assist;standing Pt Will Perform Upper Body Bathing: with supervision;with set-up;sitting Pt Will Perform Lower Body Bathing: with min guard assist;sit to/from stand Pt Will Perform Upper Body Dressing: with set-up;with supervision;sitting Pt Will Perform Lower Body Dressing: with min guard assist;sit to/from  stand Pt Will Transfer to Toilet: with set-up;with supervision;regular height toilet;grab bars;bedside commode;ambulating Pt Will Perform Toileting - Clothing Manipulation and hygiene: with min guard assist;sit to/from stand  OT Frequency: Min 2X/week   Barriers to D/C:            Co-evaluation              AM-PAC PT "6 Clicks" Daily Activity     Outcome Measure Help from another person eating meals?: A Little Help from another person taking care of personal grooming?: A Little Help from another person toileting, which includes using toliet, bedpan, or urinal?: A Lot Help from another person bathing (including washing, rinsing, drying)?: A Lot Help from another person to put on and taking off regular upper body clothing?: A Lot Help from another person to put on and taking off regular lower body clothing?: A Lot 6 Click Score: 14   End of Session Equipment Utilized During Treatment: Oxygen;Rolling walker Nurse Communication: Mobility status  Activity Tolerance: Patient tolerated treatment well Patient left: in chair;with call bell/phone within reach;with chair alarm set  OT Visit Diagnosis: Unsteadiness on feet (R26.81);Cognitive communication deficit (R41.841)                Time: 8119-1478 OT Time Calculation (min): 30 min Charges:  OT General Charges $OT Visit: 1 Procedure OT Evaluation $OT Eval Moderate Complexity: 1 Procedure OT Treatments $Therapeutic Activity: 8-22 mins G-Codes:     [employer], OTR/L [phone]   Jeani Hawking M 05/16/2017, 1:41 PM

## 2017-05-16 NOTE — Progress Notes (Signed)
CT surgery p.m. Rounds  Patient responsive, comfortable in bed Patient positive about walking in the hallway today Rectal tube in place

## 2017-05-16 NOTE — Progress Notes (Signed)
ANTICOAGULATION CONSULT NOTE Pharmacy Consult for Heparin  Indication: mechanical AVR  Allergies  Allergen Reactions  . No Known Allergies     Patient Measurements: Height: 5\' 10"  (177.8 cm) Weight: 148 lb 5.9 oz (67.3 kg) IBW/kg (Calculated) : 73   Vital Signs: Temp: 98 F (36.7 C) (07/05 0300) Temp Source: Oral (07/05 0300) BP: 111/60 (07/05 0700) Pulse Rate: 93 (07/05 0700)  Labs:  Recent Labs  05/14/17 0426 05/14/17 0843  05/15/17 0357 05/15/17 1300 05/15/17 1716 05/16/17 0415 05/16/17 0432  HGB  --  9.0*  < > 8.4*  --  9.2* 8.9*  --   HCT  --  29.2*  < > 27.6*  --  27.0* 28.9*  --   PLT  --  767*  --  818*  --   --  851*  --   LABPROT 17.2*  --   --  18.2*  --   --  20.3*  --   INR 1.39  --   --  1.49  --   --  1.72  --   HEPARINUNFRC 0.40  --   --   --  0.59  --   --  0.41  CREATININE  --   --   < > 0.71  --  0.70 0.87  --   < > = values in this interval not displayed.  Estimated Creatinine Clearance: 73.1 mL/min (by C-G formula based on SCr of 0.87 mg/dL).  . sodium chloride 10 mL/hr at 05/16/17 0700  . heparin 2,000 Units/hr (05/16/17 0700)  . norepinephrine (LEVOPHED) Adult infusion Stopped (05/12/17 1745)  . potassium chloride 10 mEq (05/16/17 0659)   . sodium chloride 10 mL/hr at 05/16/17 0700  . heparin 2,000 Units/hr (05/16/17 0700)  . norepinephrine (LEVOPHED) Adult infusion Stopped (05/12/17 1745)  . potassium chloride 10 mEq (05/16/17 0659)     Assessment: 71 year old male with h/o CHF EF 30% 01/2017, now EF 15% post surgery/arrest admitted for ascending aorta repair 04/29/17. Hx AVR on warfarin PTA restarted 7/1, on IV heparin while INR is subtherapeutic 1.7.  Heparin drip 2000 uts/hr, heparin level this am at goal (0.4) Hgb fairly stable.  Pltc rising.  No further nosebleeds overnight.  Patient reports issues with frequent nosebleeds for past several years.  Goal of Therapy:  Heparin level = 0.3 - 0.5 units / ml Monitor platelets by  anticoagulation protocol: Yes   Plan:  Continue IV Heparin at rate of 2000 units/hr. Daily heparin level and CBC. Coumadin resumed - dosing per physician.  Sheppard Coil PharmD., BCPS Clinical Pharmacist 05/16/2017 7:48 AM

## 2017-05-17 ENCOUNTER — Inpatient Hospital Stay (HOSPITAL_COMMUNITY): Payer: Medicare Other

## 2017-05-17 LAB — BASIC METABOLIC PANEL
Anion gap: 6 (ref 5–15)
BUN: 19 mg/dL (ref 6–20)
CO2: 27 mmol/L (ref 22–32)
Calcium: 8.1 mg/dL — ABNORMAL LOW (ref 8.9–10.3)
Chloride: 106 mmol/L (ref 101–111)
Creatinine, Ser: 0.74 mg/dL (ref 0.61–1.24)
GFR calc Af Amer: >60 mL/min (ref >60)
GFR calc non Af Amer: >60 mL/min (ref >60)
Glucose, Bld: 124 mg/dL — ABNORMAL HIGH (ref 65–99)
Potassium: 3.1 mmol/L — ABNORMAL LOW (ref 3.5–5.1)
Sodium: 139 mmol/L (ref 135–145)

## 2017-05-17 LAB — PROTIME-INR
INR: 2.89
Prothrombin Time: 30.8 seconds — ABNORMAL HIGH (ref 11.4–15.2)

## 2017-05-17 LAB — CBC
HCT: 26.8 % — ABNORMAL LOW (ref 39.0–52.0)
Hemoglobin: 8.1 g/dL — ABNORMAL LOW (ref 13.0–17.0)
MCH: 29 pg (ref 26.0–34.0)
MCHC: 30.2 g/dL (ref 30.0–36.0)
MCV: 96.1 fL (ref 78.0–100.0)
Platelets: 785 10*3/uL — ABNORMAL HIGH (ref 150–400)
RBC: 2.79 MIL/uL — ABNORMAL LOW (ref 4.22–5.81)
RDW: 15.7 % — ABNORMAL HIGH (ref 11.5–15.5)
WBC: 6.8 10*3/uL (ref 4.0–10.5)

## 2017-05-17 LAB — POCT I-STAT, CHEM 8
BUN: 18 mg/dL (ref 6–20)
Calcium, Ion: 1.2 mmol/L (ref 1.15–1.40)
Chloride: 101 mmol/L (ref 101–111)
Creatinine, Ser: 0.7 mg/dL (ref 0.61–1.24)
Glucose, Bld: 117 mg/dL — ABNORMAL HIGH (ref 65–99)
HCT: 25 % — ABNORMAL LOW (ref 39.0–52.0)
Hemoglobin: 8.5 g/dL — ABNORMAL LOW (ref 13.0–17.0)
Potassium: 3.6 mmol/L (ref 3.5–5.1)
Sodium: 141 mmol/L (ref 135–145)
TCO2: 26 mmol/L (ref 0–100)

## 2017-05-17 LAB — GLUCOSE, CAPILLARY: Glucose-Capillary: 117 mg/dL — ABNORMAL HIGH (ref 65–99)

## 2017-05-17 LAB — HEPARIN LEVEL (UNFRACTIONATED): Heparin Unfractionated: 0.17 IU/mL — ABNORMAL LOW (ref 0.30–0.70)

## 2017-05-17 MED ORDER — POTASSIUM CHLORIDE 10 MEQ/50ML IV SOLN
10.0000 meq | INTRAVENOUS | Status: AC
  Administered 2017-05-17 (×3): 10 meq via INTRAVENOUS
  Filled 2017-05-17 (×3): qty 50

## 2017-05-17 NOTE — NC FL2 (Signed)
[place] LEVEL OF CARE SCREENING TOOL     IDENTIFICATION  Patient Name: Jack Joseph Birthdate: 11-Apr-1946 Sex: male Admission Date (Current Location): 04/29/2017  [facility] and [place] Number:  Herbalist and Address:  [facility] [place], [place] [address] [place], [place] [address]      Provider Number: [id number]  Attending Physician Name and Address:  [name], MD  Relative Name and Phone Number:       Current Level of Care: Hospital Recommended Level of Care: [place] Prior Approval Number:    Date Approved/Denied:   PASRR Number: [id number]  Discharge Plan: SNF    Current Diagnoses: Patient Active Problem List   Diagnosis Date Noted  . Atelectasis   . Hyperglycemia   . Delirium   . Complication of chest tube   . Chest tube in place   . Acute encephalopathy   . Essential hypertension   . Dilatation of aorta (HCC)   . Postop check   . Acute respiratory failure with hypoxia ([place])   . VT (ventricular tachycardia) ([place])   . 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 Thoracic aortic aneurysm without rupture ([place]) 02/25/2017  . Mass of left lung 02/25/2017  . Acute on chronic systolic (congestive) heart failure ([place]) 02/25/2017  . CHF (congestive heart failure) ([place]) 07/10/2016  . Varicose veins of both lower extremities 07/10/2016  . Erectile dysfunction 07/10/2016  . AVD (aortic valve disease)   . Dilated cardiomyopathy ([place])   . Hyperlipidemia   . Chronic anticoagulation   . Hx of replacement of aortic valve 03/15/2011    Orientation RESPIRATION BLADDER Height & Weight     Self, Time, Situation, Place  O2 (1.5 L [place]) Continent Weight: 145 lb 1.6 oz (65.8 kg) Height:  _0  (177.8 cm)  BEHAVIORAL SYMPTOMS/MOOD NEUROLOGICAL BOWEL NUTRITION STATUS      Incontinent (rectal tube) Diet (see DC summary)  AMBULATORY STATUS COMMUNICATION OF NEEDS Skin   Limited Assist Verbally Normal                       Personal Care Assistance Level of Assistance  Bathing, Dressing Bathing Assistance: Limited assistance   Dressing Assistance: Limited assistance     Functional Limitations Info             SPECIAL CARE FACTORS FREQUENCY  PT (By licensed PT), OT (By licensed OT)     PT Frequency: 5/wk OT Frequency: 5/wk            Contractures      Additional Factors Info  Code Status, Allergies, Isolation Precautions Code Status Info: FULL Allergies Info: NKA     Isolation Precautions Info: enteric     Current Medications (05/17/2017):  This is the current hospital active medication list Current Facility-Administered Medications  Medication Dose Route Frequency Provider Last Rate Last Dose  . 0.9 %  sodium chloride infusion   Intravenous Continuous Mannam, Praveen, MD 10 mL/hr at 05/17/17 0700    . amiodarone (PACERONE) tablet 200 mg  200 mg Oral Daily Evans Lance, MD   200 mg at 05/17/17 0934  . chlorhexidine gluconate (MEDLINE KIT) (PERIDEX) 0.12 % solution 15 mL  15 mL Mouth Rinse BID [name], MD   15 mL at 05/17/17 0800  . Chlorhexidine Gluconate Cloth 2 % PADS 6 each  6 each Topical Daily [name], MD   6 each at 05/17/17  0600  . diphenhydrAMINE (BENADRYL) 12.5 MG/5ML elixir 12.5 mg  12.5 mg Oral QHS PRN [name], MD      . feeding supplement (ENSURE ENLIVE) (ENSURE ENLIVE) liquid 237 mL  237 mL Oral BID BM [name], MD   237 mL at 05/16/17 0911  . guaiFENesin (ROBITUSSIN) 100 MG/5ML solution 100 mg  5 mL Per Tube Q4H PRN Mannam, Praveen, MD   100 mg at 05/12/17 2224  . levalbuterol (XOPENEX) nebulizer solution 0.63 mg  0.63 mg Nebulization Q4H PRN [name], MD   0.63 mg at 05/07/17 0154  . levalbuterol (XOPENEX) nebulizer solution 0.63 mg  0.63 mg Nebulization TID Rush Farmer, MD   0.63 mg at 05/17/17 0920  . magic mouthwash  5 mL Oral TID [place] Gum, Collier Salina, MD   5 mL at 05/17/17 0917  . MEDLINE  mouth rinse  15 mL Mouth Rinse BID [name], MD   15 mL at 05/17/17 1610  . ondansetron (ZOFRAN) injection 4 mg  4 mg Intravenous Q6H PRN Barrett, Erin R, PA-C      . oxyCODONE (Oxy IR/ROXICODONE) immediate release tablet 7.5 mg  7.5 mg Oral Q3H PRN [name], MD      . pantoprazole (PROTONIX) EC tablet 40 mg  40 mg Oral Daily [name], MD   40 mg at 05/17/17 0934  . sodium chloride flush (NS) 0.9 % injection 10-40 mL  10-40 mL Intracatheter Q12H [name], MD   10 mL at 05/17/17 0917  . sodium chloride flush (NS) 0.9 % injection 10-40 mL  10-40 mL Intracatheter PRN [name], MD   10 mL at 05/14/17 2316  . sodium chloride flush (NS) 0.9 % injection 3 mL  3 mL Intravenous Q12H Barrett, Erin R, PA-C   3 mL at 05/17/17 0917  . sodium chloride flush (NS) 0.9 % injection 3 mL  3 mL Intravenous PRN Barrett, Erin R, PA-C   3 mL at 05/16/17 0912  . Warfarin - Physician Dosing Inpatient   Does not apply [id number] Gaye Pollack, MD         Discharge Medications: Please see discharge summary for a list of discharge medications.  Relevant Imaging Results:  Relevant Lab Results:   Additional Information SS#: [id number]  Jorge Ny, LCSW

## 2017-05-17 NOTE — Progress Notes (Signed)
  Speech Language Pathology Treatment: Dysphagia  Patient Details Name: Crisoforo Radwan MRN: [id number] DOB: 1946/04/21 Today's Date: 05/17/2017 Time: 1545-1600 SLP Time Calculation (min) (ACUTE ONLY): 15 min  Assessment / Plan / Recommendation Clinical Impression  Pt with improved mentation today.  Progressing well with current diet restrictions; lung sounds remained diminished at bases; rhonchi upper lobes.  CXR Stable left lower lobe atelectasis and infiltrate.  Pt requires ongoing cues to properly and consistently tuck chin with trials of thin liquid.  Recommend continuing dysphagia 3, nectars through weekend; repeat MBS on Monday, 7/9.  Pt/wife agree.    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 Underwent ascending aortic replacement/wheat procedure with CABG. Was progressing well post op and ambulating in hall. However just after midnight 6/20 had VT arrest with CPR and shock x 3. Intubated during ACLS.  Right pneumothorax with chest tube x2. ETT 6/20-6/21; reintubated 6/21-6/24, reintubated 6/26-7/1.       SLP Plan  Continue with current plan of care       Recommendations  Diet recommendations: Dysphagia 3 (mechanical soft);Nectar-thick liquid Liquids provided via: Cup;Straw Medication Administration: Whole meds with puree Supervision: Patient able to self feed;Intermittent supervision to cue for compensatory strategies Compensations: Slow rate;Small sips/bites Postural Changes and/or Swallow Maneuvers: Seated upright 90 degrees                Oral Care Recommendations: Oral care BID Follow up Recommendations: Inpatient Rehab SLP Visit Diagnosis: Dysphagia, pharyngeal phase (R13.13) Plan: Continue with current plan of care       GO                [name] [name] 05/17/2017, 4:15 PM

## 2017-05-17 NOTE — Progress Notes (Signed)
Rehab admissions - I met with patient and his wife at the bedside.  Patient currently has a rectal tube and has very loose stools.  Once rectal tube is discontinued and loose stools are more formed, then can consider inpatient rehab admission if still needed.  I will check back on Monday for progress.  Call me for questions.  #[phone]

## 2017-05-17 NOTE — Progress Notes (Signed)
Patient ID: Kayshaun Polanco, male   DOB: 02-21-1946, 71 y.o.   MRN: [id number] TCTS DAILY ICU PROGRESS NOTE                   [address]            [place] [address]          [phone]   18 Days Post-Op Procedure(s) (LRB): REDO STERNOTOMY (N/A) REPLACEMENT ASCENDING AORTA/ WHEAT PROCEDURE, HYPOTHERMIC CIRCULATORY ARREST AND CARDIOPULMONARY BYPASS, AND USE OF HEMASHIELD PLATINUM WOVEN DOUBLE VELOUR VASCULAR GRAFT 32 MM X 50 CM (N/A) TRANSESOPHAGEAL ECHOCARDIOGRAM (TEE) (N/A)  Total Length of Stay:  LOS: 18 days   Subjective: Up to chair, awake , knows what therapeutic inr should be and asked what his was  Still with loose stools  Objective: Vital signs in last 24 hours: Temp:  [97.7 F (36.5 C)-98.4 F (36.9 C)] 97.8 F (36.6 C) (07/06 0700) Pulse Rate:  [66-94] 85 (07/06 0800) Cardiac Rhythm: Normal sinus rhythm (07/06 0800) Resp:  [14-33] 22 (07/06 0800) BP: (90-111)/(49-80) 102/61 (07/06 0800) SpO2:  [86 %-99 %] 95 % (07/06 0800) Weight:  [145 lb 1.6 oz (65.8 kg)] 145 lb 1.6 oz (65.8 kg) (07/06 0500)  Filed Weights   05/15/17 0500 05/16/17 0500 05/17/17 0500  Weight: 155 lb 3.3 oz (70.4 kg) 148 lb 5.9 oz (67.3 kg) 145 lb 1.6 oz (65.8 kg)    Weight change: -3 lb 4.3 oz (-1.483 kg)   Hemodynamic parameters for last 24 hours:    Intake/Output from previous day: 07/05 0701 - 07/06 0700 In: 1860 [P.O.:940; I.V.:720; IV Piggyback:200] Out: 2625 [Urine:1650; Stool:975]  Intake/Output this shift: Total I/O In: 0  Out: 300 [Urine:300]  Current Meds: Scheduled Meds: . amiodarone  200 mg Oral Daily  . chlorhexidine gluconate (MEDLINE KIT)  15 mL Mouth Rinse BID  . Chlorhexidine Gluconate Cloth  6 each Topical Daily  . feeding supplement (ENSURE ENLIVE)  237 mL Oral BID BM  . levalbuterol  0.63 mg Nebulization TID  . magic mouthwash  5 mL Oral TID  . mouth rinse  15 mL Mouth Rinse BID  . pantoprazole  40 mg Oral Daily  . sodium chloride flush   10-40 mL Intracatheter Q12H  . sodium chloride flush  3 mL Intravenous Q12H  . warfarin  4 mg Oral q1800  . Warfarin - Physician Dosing Inpatient   Does not apply q1800   Continuous Infusions: . sodium chloride 10 mL/hr at 05/17/17 0700  . heparin 2,000 Units/hr (05/17/17 0806)  . norepinephrine (LEVOPHED) Adult infusion Stopped (05/12/17 1745)  . potassium chloride Stopped (05/17/17 0829)   PRN Meds:.diphenhydrAMINE, guaiFENesin, levalbuterol, ondansetron (ZOFRAN) IV, oxyCODONE, sodium chloride flush, sodium chloride flush  General appearance: alert and cooperative Neurologic: intact Heart: regular rate and rhythm, S1, S2 normal, no murmur, click, rub or gallop Lungs: diminished breath sounds LLL Abdomen: soft, non-tender; bowel sounds normal; no masses,  no organomegaly Extremities: extremities normal, atraumatic, no cyanosis or edema and Homans sign is negative, no sign of DVT Wound: sternum intact without drainage   Lab Results: CBC: Recent Labs  05/16/17 0415 05/16/17 1738 05/17/17 0335  WBC 10.3  --  6.8  HGB 8.9* 8.2* 8.1*  HCT 28.9* 24.0* 26.8*  PLT 851*  --  785*   BMET:  Recent Labs  05/16/17 0415 05/16/17 1738 05/17/17 0335  NA 141 140 139  K 3.4* 3.1* 3.1*  CL 108 102 106  CO2  25  --  27  GLUCOSE 111* 96 124*  BUN 24* 21* 19  CREATININE 0.87 0.80 0.74  CALCIUM 8.3*  --  8.1*    CMET: Lab Results  Component Value Date   WBC 6.8 05/17/2017   HGB 8.1 (L) 05/17/2017   HCT 26.8 (L) 05/17/2017   PLT 785 (H) 05/17/2017   GLUCOSE 124 (H) 05/17/2017   CHOL 199 01/22/2017   TRIG 71 01/22/2017   HDL 64 01/22/2017   LDLCALC 121 (H) 01/22/2017   ALT 67 (H) 05/15/2017   AST 28 05/15/2017   NA 139 05/17/2017   K 3.1 (L) 05/17/2017   CL 106 05/17/2017   CREATININE 0.74 05/17/2017   BUN 19 05/17/2017   CO2 27 05/17/2017   TSH 4.065 05/09/2017   INR 2.89 05/17/2017   HGBA1C 5.7 (H) 04/25/2017      PT/INR:  Recent Labs  05/17/17 0335  LABPROT  30.8*  INR 2.89   Radiology: Dg Chest Port 1 View  Result Date: 05/17/2017 CLINICAL DATA:  Post cardiac thoracic surgery, replacement of ascending aorta. EXAM: PORTABLE CHEST 1 VIEW COMPARISON:  05/16/2017. FINDINGS: Right PICC line in stable position. Mediastinum including aortic contour stable. Prior cardiac valve replacement. Stable cardiomegaly. Stable left lower lobe atelectasis and infiltrate. Stable small left pleural effusion. Stable right sided pleuroparenchymal thickening at prior chest tube site skin folds again noted. No pneumothorax. Right chest wall subcutaneous emphysema, improved from prior exam . IMPRESSION: 1. Right PICC line stable position. No pneumothorax. Right chest wall subcutaneous emphysema has improved from prior exam. 2. Stable left lower lobe atelectasis and infiltrate. Stable small left pleural effusion. 3.  Prior cardiac valve replacement.  Stable cardiomegaly. Electronically Signed   By: Marcello Moores  Register   On: 05/17/2017 07:46     Assessment/Plan: S/P Procedure(s) (LRB): REDO STERNOTOMY (N/A) REPLACEMENT ASCENDING AORTA/ WHEAT PROCEDURE, HYPOTHERMIC CIRCULATORY ARREST AND CARDIOPULMONARY BYPASS, AND USE OF HEMASHIELD PLATINUM WOVEN DOUBLE VELOUR VASCULAR GRAFT 32 MM X 50 CM (N/A) TRANSESOPHAGEAL ECHOCARDIOGRAM (TEE) (N/A) Mobilize Replacing kcl Still with loose stools - cdiff sent x 1 positive antigen, negative toxin and neg  pcr  D/c heparin  Continue pt    [name] 05/17/2017 8:56 AM

## 2017-05-17 NOTE — Progress Notes (Signed)
Progress Note  Patient Name: Jack Joseph Date of Encounter: 05/17/2017  Primary Cardiologist: [place]  Subjective   Feels better though diarrhea persists  Inpatient Medications    Scheduled Meds: . amiodarone  200 mg Oral Daily  . chlorhexidine gluconate (MEDLINE KIT)  15 mL Mouth Rinse BID  . Chlorhexidine Gluconate Cloth  6 each Topical Daily  . feeding supplement (ENSURE ENLIVE)  237 mL Oral BID BM  . levalbuterol  0.63 mg Nebulization TID  . magic mouthwash  5 mL Oral TID  . mouth rinse  15 mL Mouth Rinse BID  . pantoprazole  40 mg Oral Daily  . sodium chloride flush  10-40 mL Intracatheter Q12H  . sodium chloride flush  3 mL Intravenous Q12H  . warfarin  4 mg Oral q1800  . Warfarin - Physician Dosing Inpatient   Does not apply q1800   Continuous Infusions: . sodium chloride 10 mL/hr at 05/17/17 0700  . heparin 2,000 Units/hr (05/17/17 0806)  . norepinephrine (LEVOPHED) Adult infusion Stopped (05/12/17 1745)  . potassium chloride 10 mEq (05/17/17 0756)   PRN Meds: diphenhydrAMINE, guaiFENesin, levalbuterol, ondansetron (ZOFRAN) IV, oxyCODONE, sodium chloride flush, sodium chloride flush   Vital Signs    Vitals:   05/17/17 0500 05/17/17 0546 05/17/17 0600 05/17/17 0700  BP: (!) 105/58 107/62 (!) 106/57   Pulse: 87 88 90 90  Resp: (!) 23 (!) 22 (!) 26 (!) 24  Temp:  98.4 F (36.9 C)  97.8 F (36.6 C)  TempSrc:  Oral    SpO2: 92% 91% 92% 92%  Weight: 145 lb 1.6 oz (65.8 kg)     Height:        Intake/Output Summary (Last 24 hours) at 05/17/17 0833 Last data filed at 05/17/17 0739  Gross per 24 hour  Intake             1860 ml  Output             2775 ml  Net             -915 ml   Filed Weights   05/15/17 0500 05/16/17 0500 05/17/17 0500  Weight: 155 lb 3.3 oz (70.4 kg) 148 lb 5.9 oz (67.3 kg) 145 lb 1.6 oz (65.8 kg)    Telemetry    nsr - Personally Reviewed  ECG    none - Personally Reviewed  Physical Exam   GEN: No acute distress.     Neck: 6 cm JVD Cardiac: RRR, no murmurs, rubs, or gallops.  Respiratory: Clear to auscultation bilaterally. GI: Soft, nontender, non-distended  MS: No edema; No deformity. Neuro:  Nonfocal  Psych: Normal affect   Labs    Chemistry Recent Labs Lab 05/13/17 0415  05/15/17 0357  05/16/17 0415 05/16/17 1738 05/17/17 0335  NA 139  < > 142  < > 141 140 139  K 4.1  < > 3.9  < > 3.4* 3.1* 3.1*  CL 106  < > 108  < > 108 102 106  CO2 27  --  26  --  25  --  27  GLUCOSE 129*  < > 99  < > 111* 96 124*  BUN 22*  < > 16  < > 24* 21* 19  CREATININE 0.71  < > 0.71  < > 0.87 0.80 0.74  CALCIUM 8.2*  --  8.3*  --  8.3*  --  8.1*  PROT 5.4*  --  5.4*  --   --   --   --  ALBUMIN 1.8*  --  1.9*  --   --   --   --   AST 70*  --  28  --   --   --   --   ALT 125*  --  67*  --   --   --   --   ALKPHOS 170*  --  140*  --   --   --   --   BILITOT 0.7  --  0.7  --   --   --   --   GFRNONAA >60  --  >60  --  >60  --  >60  GFRAA >60  --  >60  --  >60  --  >60  ANIONGAP 6  --  8  --  8  --  6  < > = values in this interval not displayed.   Hematology Recent Labs Lab 05/15/17 0357  05/16/17 0415 05/16/17 1738 05/17/17 0335  WBC 13.4*  --  10.3  --  6.8  RBC 2.87*  --  3.02*  --  2.79*  HGB 8.4*  < > 8.9* 8.2* 8.1*  HCT 27.6*  < > 28.9* 24.0* 26.8*  MCV 96.2  --  95.7  --  96.1  MCH 29.3  --  29.5  --  29.0  MCHC 30.4  --  30.8  --  30.2  RDW 15.4  --  15.3  --  15.7*  PLT 818*  --  851*  --  785*  < > = values in this interval not displayed.  Cardiac EnzymesNo results for input(s): TROPONINI in the last 168 hours. No results for input(s): TROPIPOC in the last 168 hours.   BNPNo results for input(s): BNP, PROBNP in the last 168 hours.   DDimer No results for input(s): DDIMER in the last 168 hours.   Radiology    Dg Chest Port 1 View  Result Date: 05/17/2017 CLINICAL DATA:  Post cardiac thoracic surgery, replacement of ascending aorta. EXAM: PORTABLE CHEST 1 VIEW COMPARISON:   05/16/2017. FINDINGS: Right PICC line in stable position. Mediastinum including aortic contour stable. Prior cardiac valve replacement. Stable cardiomegaly. Stable left lower lobe atelectasis and infiltrate. Stable small left pleural effusion. Stable right sided pleuroparenchymal thickening at prior chest tube site skin folds again noted. No pneumothorax. Right chest wall subcutaneous emphysema, improved from prior exam . IMPRESSION: 1. Right PICC line stable position. No pneumothorax. Right chest wall subcutaneous emphysema has improved from prior exam. 2. Stable left lower lobe atelectasis and infiltrate. Stable small left pleural effusion. 3.  Prior cardiac valve replacement.  Stable cardiomegaly. Electronically Signed   By: Marcello Moores  Register   On: 05/17/2017 07:46   Dg Chest Port 1 View  Result Date: 05/16/2017 CLINICAL DATA:  71 year old male postoperative day 17 status post cardiothoracic surgery, replacement of ascending aorta/Wheat procedure. Right chest tube removal yesterday. EXAM: PORTABLE CHEST 1 VIEW COMPARISON:  05/15/2017 and earlier. FINDINGS: Portable AP semi upright view at 0611 hours. Mild volume of bilateral upper chest and supraclavicular subcutaneous emphysema is stable. Skin fold artifact. No pneumothorax identified. Stable right PICC line.  Epicardial pacer leads remain in place. Stable cardiac size and mediastinal contours. Left lower lobe collapse or consolidation with small left pleural effusion. No pulmonary edema. Stable ventilation since yesterday. IMPRESSION: 1. No pneumothorax or new cardiopulmonary abnormality identified. 2. Stable left lower lobe collapse or consolidation and probable small left pleural effusion. Electronically Signed   By: Herminio Heads.D.  On: 05/16/2017 07:24   Dg Chest Port 1 View  Result Date: 05/15/2017 CLINICAL DATA:  Chest tube removal this morning. EXAM: PORTABLE CHEST 1 VIEW COMPARISON:  Most recent radiographs earlier this day. Chest CT 05/07/2017  FINDINGS: Right pigtail catheter is been removed. Previous tiny apical pneumothorax is tentatively identified and unchanged. No progression. Right upper extremity PICC tip in the mid SVC. Linear peripheral opacity in the right midlung may be minimal fluid in the fissure or atelectasis, this appears along the course of prior chest tube. Stable cardiomegaly, median sternotomy and prosthetic valve. Stable retrocardiac opacity in left pleural effusion. Subcutaneous emphysema again seen, not significantly changed. IMPRESSION: 1. Removal of right pigtail catheter. Unchanged tiny right apical pneumothorax from exam earlier this day. 2. Minimal developing linear opacity in the right mid lung may be pleural fluid in the fissure or atelectasis/scarring, may be along the course of prior chest tube. 3. Unchanged left lung base opacity and pleural effusion. Electronically Signed   By: Jeb Levering M.D.   On: 05/15/2017 20:01    Cardiac Studies   none  Patient Profile     71 y.o. male admitted for ascending aorta replacement with post op VT, VDRF, now extubated with slow recovery.   Assessment & Plan    1. VT - he is doing well on amiodarone 200 mg daily. Consider repeating liver panel before discharge 2. Ascending aorta replacement - he is stable from the surgical perspective 3. Diarrhea - eval noted. Still with loose stools.  Signed, Cristopher Peru, MD  05/17/2017, 8:33 AM  Patient ID: [name], male   DOB: [birth year], 71 y.o.   MRN: [id number]

## 2017-05-17 NOTE — Clinical Social Work Note (Signed)
Clinical Social Work Assessment  Patient Details  Name: Jack Joseph MRN: [id number] Date of Birth: 1946/05/29  Date of referral:  05/17/17               Reason for consult:  Facility Placement                Permission sought to share information with:  Oceanographer granted to share information::  Yes, Verbal Permission Granted  Name::     Data processing manager::  SNF  Relationship::  wife  Contact Information:     Housing/Transportation Living arrangements for the past 2 months:  Single Family Home Source of Information:  Patient, Adult Children Patient Interpreter Needed:  None Criminal Activity/Legal Involvement Pertinent to Current Situation/Hospitalization:  No - Comment as needed Significant Relationships:  Adult Children, Spouse Lives with:  Spouse Do you feel safe going back to the place where you live?  Yes Need for family participation in patient care:  No (Coment)  Care giving concerns:  Pt lives at home with spouse- currently requiring more help than normal- unsure if patient would be safe at home.   Social Worker assessment / plan:  CSW spoke with pt and pt wife about CIR vs SNF.  Explained differences between rehab programs and recommendation to consider SNF as alternative option is CIR cannot admit.    Employment status:  Retired Engineer, mining PT Recommendations:  Inpatient Rehab Consult Information / Referral to community resources:  Skilled Nursing Facility  Patient/Family's Response to care:  Pt and wife are not sure about SNF at this time and express strong preference for CIR if possible- think patient is very motivated and would do better in a more intensive program.  They are agreeable to referral being sent to SNF just in case but not 100% sure they will be agreeable to SNF placement.  Patient/Family's Understanding of and Emotional Response to Diagnosis, Current Treatment, and Prognosis:  Appears to have good  understanding of current treatment plan- patient very motivated to improve and hopeful for CIR admission to get him home faster.  Emotional Assessment Appearance:  Appears stated age Attitude/Demeanor/Rapport:    Affect (typically observed):  Appropriate Orientation:  Oriented to Self, Oriented to Place, Oriented to  Time, Oriented to Situation Alcohol / Substance use:  Not Applicable Psych involvement (Current and /or in the community):  No (Comment)  Discharge Needs  Concerns to be addressed:  Care Coordination Readmission within the last 30 days:  No Current discharge risk:  Physical Impairment Barriers to Discharge:  Continued Medical Work up   Burna Sis, LCSW 05/17/2017, 3:59 PM

## 2017-05-17 NOTE — Progress Notes (Signed)
Nutrition Follow-up  INTERVENTION:   Ensure Enlive po BID, each supplement provides 350 kcal and 20 grams of protein  NUTRITION DIAGNOSIS:   Increased nutrient needs related to wound healing as evidenced by estimated needs. Ongoing.   GOAL:   Patient will meet greater than or equal to 90% of their needs Progressing.   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  Arrest 6/20.   Pt with loose stools 975 ml via rectal tube x 24 hours Per Rehab will consider admission once rectal tube is removed.   Medications reviewed and include: magic mouthwash Labs reviewed: K+ 3.1 (L) Weight down to 145 lb which is similar to his recent weight hx. Diet advanced to Dysphagia 3/Nectar thick liquids on 7/3  Pt eating lunch with family present. Fair appetite per pt. Pt has been walking unit.    Diet Order:  DIET DYS 3 Room service appropriate? Yes; Fluid consistency: Nectar Thick  Skin:  Reviewed, no issues  Last BM:  975 ml via rectal tube x 24 hrs  Height:   Ht Readings from Last 1 Encounters:  05/02/17 5\' 10"  (1.778 m)    Weight:   Wt Readings from Last 1 Encounters:  05/17/17 145 lb 1.6 oz (65.8 kg)    Ideal Body Weight:  75.4 kg  BMI:  Body mass index is 20.82 kg/m.  Estimated Nutritional Needs:   Kcal:  1800-2000  Protein:  100-115 grams  Fluid:  > 1.7 L/day  EDUCATION NEEDS:   No education needs identified at this time  Kendell Bane RD, LDN, CNSC [phone] Pager [phone] After Hours Pager

## 2017-05-17 NOTE — Progress Notes (Signed)
      [address]       ,[place] [address]             [phone]       BP 116/73   Pulse (!) 109   Temp 98.6 F (37 C) (Oral)   Resp 19   Ht 5\' 10"  (1.778 m)   Wt 145 lb 1.6 oz (65.8 kg)   SpO2 94%   BMI 20.82 kg/m    Intake/Output Summary (Last 24 hours) at 05/17/17 2125 Last data filed at 05/17/17 2100  Gross per 24 hour  Intake          1590.33 ml  Output             1875 ml  Net          -284.67 ml    Loose stools persist K= 3.6 replaced via protocol Hct= 25   Bert Givans C. Dorris Fetch, MD [facility] [phone]

## 2017-05-17 NOTE — Progress Notes (Signed)
ANTICOAGULATION CONSULT NOTE - Follow Up Consult  Pharmacy Consult for Heparin Indication: mechanical AVR  Allergies  Allergen Reactions  . No Known Allergies     Patient Measurements: Height: 5\' 10"  (177.8 cm) Weight: 145 lb 1.6 oz (65.8 kg) IBW/kg (Calculated) : 73  Vital Signs: Temp: 97.8 F (36.6 C) (07/06 0700) Temp Source: Oral (07/06 0546) BP: 102/61 (07/06 0800) Pulse Rate: 85 (07/06 0800)  Labs:  Recent Labs  05/15/17 0357 05/15/17 1300  05/16/17 0415 05/16/17 0432 05/16/17 1738 05/17/17 0335  HGB 8.4*  --   < > 8.9*  --  8.2* 8.1*  HCT 27.6*  --   < > 28.9*  --  24.0* 26.8*  PLT 818*  --   --  851*  --   --  785*  LABPROT 18.2*  --   --  20.3*  --   --  30.8*  INR 1.49  --   --  1.72  --   --  2.89  HEPARINUNFRC  --  0.59  --   --  0.41  --  0.17*  CREATININE 0.71  --   < > 0.87  --  0.80 0.74  < > = values in this interval not displayed.  Estimated Creatinine Clearance: 77.7 mL/min (by C-G formula based on SCr of 0.74 mg/dL).   Medications:  Infusions:  . sodium chloride 10 mL/hr at 05/17/17 0700  . potassium chloride 10 mEq (05/17/17 0916)    Assessment: 71 year old male with h/o CHF EF 30% 01/2017, now EF 15% post surgery/arrest admitted for ascending aorta repair 04/29/17. Hx AVR on warfarin PTA restarted 7/1.   Heparin drip 2000 uts/hr, level was subtherapeutic this AM. Hgb fairly stable. Pltc 700s. No further nosebleeds overnight.  Patient reports issues with frequent nosebleeds for past several years. INR is 2.89 this AM. MD dosing warfarin. Heparin was  discontinued this AM by MD. It appears warfarin is being held as daily order was discontinued.   Goal of Therapy:  Heparin level 0.3-0.7 units/ml  INR Goal 2.5-3.5 Monitor platelets by anticoagulation protocol: Yes   Plan:  Heparin discontinued Coumadin dosing per physician  Adline Potter, PharmD Pharmacy Resident 05/17/2017,9:25 AM

## 2017-05-17 NOTE — Progress Notes (Signed)
Patient is alert, oriented and appropriate. Pt is not impulsive or a danger to himself. Tele sitter discontinued, wife at bedside.   Pt ambulated in hall with nursing staff 370 ft. Pt states, " I could do another lap, but I am here all weekend. Let me surprise them." Pt resting comfortably in recliner. VSS.  Will continue to monitor closely.   Delories Heinz, RN

## 2017-05-17 NOTE — Progress Notes (Signed)
Pt ambulated in hall 370 feet. Tolerated very well. Will continue to monitor.  Delories Heinz, RN

## 2017-05-17 NOTE — Progress Notes (Signed)
LifeVest ordered, plan to be placed Sunday. If discharge before then, let cardiology know to call rep to fit eariler  Gypsy Balsam, NP 05/17/2017 1:58 PM

## 2017-05-17 NOTE — PMR Pre-admission (Signed)
PMR Admission Coordinator Pre-Admission Assessment  Patient: Jack Joseph is an 71 y.o., male MRN: [id number] DOB: Jul 16, 1946 Height: _0  (177.8 cm) Weight: 65.3 kg (143 lb 15.4 oz)              Insurance Information HMO: No    PPO:       PCP:       IPA:       80/20:       OTHER:   PRIMARY:  Medicare A/B      Policy#: [id number]      Subscriber: [name] CM Name:        Phone#:       Fax#:   Pre-Cert#:        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 Date: 03/12/10     Deduct:  $1340      Out of Pocket Max: none      Life Max: N/a CIR: 100%      SNF: 100 days Outpatient: 80%     Co-Pay: 20% Home Health: 100%      Co-Pay: none DME: 80%     Co-Pay: 20% Providers: patient's choice  SECONDARY: [employer]      Policy#:        Subscriber: Patient CM Name:        Phone#:       Fax#:   Pre-Cert#:        Employer:  PT Benefits:  Phone #:  [phone]     Name:   Eff. Date:       Deduct: $2240 (met $1698.51)      Out of Pocket Max:        Life Max:   CIR:        SNF:   Outpatient:       Co-Pay:   Home Health:        Co-Pay:   DME:       Co-Pay:    Emergency Contact Information Contact Information    Name Relation Home Work Mobile   [place] Spouse [phone]     [name],[name] Daughter   [phone]   Ferlando, Lia   [phone]     Current Medical History  Patient Admitting Diagnosis: Debility after multiple sternotomy/Wheat procedure    History of Present Illness:  A 71 y.o. male with history of CAD s/pCABG, chronic systolic CHF s/p AVR '83- chronic coumadin, COPD who was admitted on 04/29/17 for redo sternotomy and repair of ascending aortic dilation/Wheat procedure.  Post op course complicated by VT with PEA arrest requiring CPR/ACLS protocol and intubation, R-PTX with large amount of subcutaneous emphysema right lateral chest, hypotension requiring pressors, severe left atelectasis  with mucous plugging requiring reintubation and hypoxemia. Dr. Lovena Le consulted for input and anticipated ICD insertion prior to discharge to home.  He tolerated extubation on 7/1 and was started on dysphagia 3, nectar liquids on 7/3 due to trace aspiration of thins. PT evaluation done yesterday revealing deconditioned state with significant decline in mobility. CIR recommended for follow up therapy.     Past Medical History  Past Medical History:  Diagnosis Date  . AVD (aortic valve disease)   . Chronic anticoagulation   . Dilated cardiomyopathy (HCC)    EF 30-35%  . Dysrhythmia   . Hyperlipidemia   . LV dysfunction     Family History  family history includes Heart disease in  his brother.  Prior Rehab/Hospitalizations: No previous rehab admissions.  Has the patient had major surgery during 100 days prior to admission? No  Current Medications   Current Facility-Administered Medications:  .  0.9 %  sodium chloride infusion, , Intravenous, Continuous, Mannam, Praveen, MD, Last Rate: 10 mL/hr at 05/21/17 0700 .  amiodarone (PACERONE) tablet 200 mg, 200 mg, Oral, Daily, Evans Lance, MD, 200 mg at 05/22/17 0937 .  bisacodyl (DULCOLAX) EC tablet 10 mg, 10 mg, Oral, Daily PRN **OR** bisacodyl (DULCOLAX) suppository 10 mg, 10 mg, Rectal, Daily PRN, [name], MD .  Chlorhexidine Gluconate Cloth 2 % PADS 6 each, 6 each, Topical, Daily, [name], MD, 6 each at 05/23/17 0418 .  diphenhydrAMINE (BENADRYL) 12.5 MG/5ML elixir 12.5 mg, 12.5 mg, Oral, QHS PRN, [name], MD .  food thickener (THICK IT) powder, , Oral, PRN, [name], MD .  guaiFENesin (ROBITUSSIN) 100 MG/5ML solution 100 mg, 5 mL, Per Tube, Q4H PRN, Mannam, Praveen, MD, 100 mg at 05/12/17 2224 .  levalbuterol (XOPENEX) nebulizer solution 0.63 mg, 0.63 mg, Nebulization, Q4H PRN, [name], MD, 0.63 mg at 05/23/17 0323 .  levalbuterol (XOPENEX) nebulizer solution 0.63 mg, 0.63 mg,  Nebulization, TID, Rush Farmer, MD, 0.63 mg at 05/23/17 0755 .  loperamide (IMODIUM) 1 MG/5ML solution 1 mg, 1 mg, Oral, PRN, Melrose Nakayama, MD, 1 mg at 05/20/17 1130 .  magic mouthwash, 5 mL, Oral, TID, Prescott Gum, Collier Salina, MD, 5 mL at 05/22/17 2225 .  MEDLINE mouth rinse, 15 mL, Mouth Rinse, BID, [name], MD, 15 mL at 05/22/17 2226 .  moving right along book, , Does not apply, Once, Lanelle Bal B, MD .  ondansetron [facility]) tablet 4 mg, 4 mg, Oral, Q6H PRN **OR** ondansetron (ZOFRAN) injection 4 mg, 4 mg, Intravenous, Q6H PRN, [name], MD .  oxyCODONE (Oxy IR/ROXICODONE) immediate release tablet 7.5 mg, 7.5 mg, Oral, Q3H PRN, [name], MD .  pantoprazole (PROTONIX) EC tablet 40 mg, 40 mg, Oral, Daily, [name], MD, 40 mg at 05/22/17 0937 .  potassium chloride 20 MEQ/15ML (10%) solution 20 mEq, 20 mEq, Oral, Daily, Melrose Nakayama, MD, 20 mEq at 05/22/17 0937 .  sodium chloride flush (NS) 0.9 % injection 3 mL, 3 mL, Intravenous, Q12H, Barrett, Erin R, PA-C, 3 mL at 05/22/17 2230 .  warfarin (COUMADIN) tablet 1 mg, 1 mg, Oral, ONCE-1800, [name], MD .  Warfarin - Physician Dosing Inpatient, , Does not apply, q1800, Gaye Pollack, MD, Stopped at 05/18/17 1800  Patients Current Diet: Diet Heart Room service appropriate? 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  Home Assistive Devices / Equipment Home Assistive Devices/Equipment: None Home Equipment: Shower seat, Crutches, Cane - single point, Grab bars - tub/shower  Prior Device Use: Indicate  devices/aids used by the patient prior to current illness, exacerbation or injury? None  Prior Functional Level Prior Function Level of Independence: Independent  Self Care: Did the patient need help bathing, dressing, using the toilet or eating? Independent  Indoor Mobility: Did the patient need assistance with walking from room to room (  with or without device)? Independent  Stairs: Did the patient need assistance with internal or external stairs (with or without device)? Independent  Functional Cognition: Did the patient need help planning regular tasks such as shopping or remembering to take medications? Independent  Current Functional Level Cognition  Overall Cognitive Status: Within Functional Limits for tasks assessed Current Attention Level: Selective Orientation Level: Oriented X4    Extremity Assessment (includes Sensation/Coordination)  Upper Extremity Assessment: Generalized weakness  Lower Extremity Assessment: Defer to PT evaluation RLE Deficits / Details: grossly 3-/5 LLE Deficits / Details: grossly 3-/5    ADLs  Overall ADL's : Needs assistance/impaired Eating/Feeding: Minimal assistance, Sitting Eating/Feeding Details (indicate cue type and reason): due to lines  Grooming: Wash/dry hands, Wash/dry face, Oral care, Supervision/safety, Sitting Upper Body Bathing: Moderate assistance, Sitting Lower Body Bathing: Moderate assistance, Sit to/from stand Lower Body Bathing Details (indicate cue type and reason): Pt able to don/doff socks with supervision  Upper Body Dressing : Moderate assistance, Sitting Lower Body Dressing: Moderate assistance, Sit to/from stand Toilet Transfer: Minimal assistance, BSC, RW, Stand-pivot Toileting- Clothing Manipulation and Hygiene: Moderate assistance, Sit to/from stand Functional mobility during ADLs: Minimal assistance, Rolling walker General ADL Comments: Pt requires assist due to generalized weakness     Mobility  Overal bed  mobility: Needs Assistance Bed Mobility: Supine to Sit, Sit to Supine Rolling: Min guard Sidelying to sit: Min guard Sit to sidelying: Min assist General bed mobility comments: pt sitting in chair upon arrival; assist to bring bilat LE into bed    Transfers  Overall transfer level: Needs assistance Equipment used: Rolling walker (2 wheeled) Transfers: Sit to/from Stand, Stand Pivot Transfers Sit to Stand: Min assist Stand pivot transfers: Min assist General transfer comment: assist for balance     Ambulation / Gait / Stairs / Wheelchair Mobility  Ambulation/Gait Ambulation/Gait assistance: +2 safety/equipment, Min assist Ambulation Distance (Feet): 15 Feet Assistive device: Rolling walker (2 wheeled) Gait Pattern/deviations: Decreased stride length, Trunk flexed, Wide base of support, Step-through pattern General Gait Details: cues for posture and decreased weightbearing on RW; limited by loose stool Gait velocity interpretation: Below normal speed for age/gender    Posture / Balance Balance Overall balance assessment: Needs assistance Sitting-balance support: No upper extremity supported, Feet supported Sitting balance-Leahy Scale: Fair Standing balance support: Bilateral upper extremity supported, During functional activity Standing balance-Leahy Scale: Poor Standing balance comment: reliant on UE support.  Pt able to statically stand with bil. UE support x 6 mins with min guard assist  High level balance activites: Direction changes, Turns High Level Balance Comments: Needing close guard assist with turns with RW as he tended to let RW get away from him.     Special needs/care consideration BiPAP/CPAP No CPM No Continuous Drip IV KVO Dialysis No      Life Vest Yes, life vest to be in place Oxygen Has 02 Powell 1 1/2 to 2 L Special Bed No Trach Size No Wound Vac (area) No   Skin Fragile skin that tears easily.  Surgical insicion, abrasion left arm, buttocks with moisture  associated skin damage left posterior lower buttocks                         Bowel mgmt: Diarrhea LBM 7/11 continent Bladder mgmt: Voiding in urinal Diabetic mgmt No    Previous Home Environment Living Arrangements: Spouse/significant other Available Help at Discharge: Family, Available 24 hours/day Type of Home: House Home Layout: One level  Home Access: Stairs to enter Entrance Stairs-Rails: None Entrance Stairs-Number of Steps: 3 Bathroom Shower/Tub: Optometrist: Yes Home Care Services: No Additional Comments: worked 6 hours day, 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  Discharge Living Setting Plans for Discharge Living Setting: Patient's home, House, Lives with (comment) (Lives with wife.) Type of Home at Discharge: House Discharge Home Layout: One level Discharge Home Access: Stairs to enter Entrance Stairs-Number of Steps: 3 steps plus 1 threshold step into home Does the patient have any problems obtaining your medications?: No  Social/Family/Support Systems Patient Roles: Spouse, Parent (Has a wife, son, dtr, and grandchildren.) Contact Information: Cash Duce - wife - [phone] Anticipated Caregiver: wife Ability/Limitations of Caregiver: Wife can assist.  Wife is busy with her photography job and with volunteer work. Caregiver Availability: 24/7 Discharge Plan Discussed with Primary Caregiver: Yes Is Caregiver In Agreement with Plan?: Yes Does Caregiver/Family have Issues with Lodging/Transportation while Pt is in Rehab?: No  Goals/Additional Needs Patient/Family Goal for Rehab: PT/OT supervision to min assist goals, SLP mod I and supervision goals. 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 Dietary Needs: Dys 3, nectar thick liquids Equipment Needs: TBD Pt/Family Agrees to Admission and willing to participate:  Yes Program Orientation Provided & Reviewed with Pt/Caregiver Including Roles  & Responsibilities: Yes  Decrease burden of Care through IP rehab admission: N/A  Possible need for SNF placement upon discharge: Not planned  Patient Condition: This patient's medical and functional status has changed since the consult dated: 05/16/17 in which the Rehabilitation Physician determined and documented that the patient's condition is appropriate for intensive rehabilitative care in an inpatient rehabilitation facility. See "History of Present Illness" (above) for medical update. Functional changes are: overall min to mod assist. Patient's medical and functional status update has been discussed with the Rehabilitation physician and patient remains appropriate for inpatient rehabilitation. Will admit to inpatient rehab today.  Preadmission Screen Completed By: Karene Fry with updates by Cleatrice Burke, 05/23/2017 10:06 AM ______________________________________________________________________   Discussed status with Dr. Letta Pate on 05/23/2017 at 1006 and received telephone approval for admission today.  Admission Coordinator: Karene Fry with updates by  Cleatrice Burke, time 1006 Date 05/23/2017

## 2017-05-18 ENCOUNTER — Inpatient Hospital Stay (HOSPITAL_COMMUNITY): Payer: Medicare Other

## 2017-05-18 LAB — POCT I-STAT, CHEM 8
BUN: 18 mg/dL (ref 6–20)
Calcium, Ion: 1.17 mmol/L (ref 1.15–1.40)
Chloride: 105 mmol/L (ref 101–111)
Creatinine, Ser: 0.7 mg/dL (ref 0.61–1.24)
Glucose, Bld: 107 mg/dL — ABNORMAL HIGH (ref 65–99)
HCT: 25 % — ABNORMAL LOW (ref 39.0–52.0)
Hemoglobin: 8.5 g/dL — ABNORMAL LOW (ref 13.0–17.0)
Potassium: 4.3 mmol/L (ref 3.5–5.1)
Sodium: 141 mmol/L (ref 135–145)
TCO2: 28 mmol/L (ref 0–100)

## 2017-05-18 LAB — COMPREHENSIVE METABOLIC PANEL
ALT: 41 U/L (ref 17–63)
AST: 24 U/L (ref 15–41)
Albumin: 1.9 g/dL — ABNORMAL LOW (ref 3.5–5.0)
Alkaline Phosphatase: 108 U/L (ref 38–126)
Anion gap: 6 (ref 5–15)
BUN: 20 mg/dL (ref 6–20)
CO2: 26 mmol/L (ref 22–32)
Calcium: 8 mg/dL — ABNORMAL LOW (ref 8.9–10.3)
Chloride: 108 mmol/L (ref 101–111)
Creatinine, Ser: 0.89 mg/dL (ref 0.61–1.24)
GFR calc Af Amer: >60 mL/min (ref >60)
GFR calc non Af Amer: >60 mL/min (ref >60)
Glucose, Bld: 119 mg/dL — ABNORMAL HIGH (ref 65–99)
Potassium: 3.9 mmol/L (ref 3.5–5.1)
Sodium: 140 mmol/L (ref 135–145)
Total Bilirubin: 0.4 mg/dL (ref 0.3–1.2)
Total Protein: 5 g/dL — ABNORMAL LOW (ref 6.5–8.1)

## 2017-05-18 LAB — CBC
HCT: 26.9 % — ABNORMAL LOW (ref 39.0–52.0)
Hemoglobin: 8.1 g/dL — ABNORMAL LOW (ref 13.0–17.0)
MCH: 29.2 pg (ref 26.0–34.0)
MCHC: 30.1 g/dL (ref 30.0–36.0)
MCV: 97.1 fL (ref 78.0–100.0)
Platelets: 703 10*3/uL — ABNORMAL HIGH (ref 150–400)
RBC: 2.77 MIL/uL — ABNORMAL LOW (ref 4.22–5.81)
RDW: 15.3 % (ref 11.5–15.5)
WBC: 8.3 10*3/uL (ref 4.0–10.5)

## 2017-05-18 LAB — PROTIME-INR
INR: 4.01
Prothrombin Time: 40.6 seconds — ABNORMAL HIGH (ref 11.4–15.2)

## 2017-05-18 MED ORDER — POTASSIUM CHLORIDE 20 MEQ/15ML (10%) PO SOLN
20.0000 meq | Freq: Every day | ORAL | Status: DC
  Administered 2017-05-18 – 2017-05-23 (×6): 20 meq via ORAL
  Filled 2017-05-18 (×6): qty 15

## 2017-05-18 NOTE — Progress Notes (Signed)
      [address]       [place] [address]             [phone]      Ambulated around unit today  BP 104/67 (BP Location: Left Arm)   Pulse 73   Temp 97.7 F (36.5 C) (Oral)   Resp (!) 23   Ht 5\' 10"  (1.778 m)   Wt 146 lb (66.2 kg)   SpO2 98%   BMI 20.95 kg/m    Intake/Output Summary (Last 24 hours) at 05/18/17 1900 Last data filed at 05/18/17 1500  Gross per 24 hour  Intake              520 ml  Output             1460 ml  Net             -940 ml    Per RN stool thicker but not formed  Viviann Spare C. Dorris Fetch, MD [facility] [phone]

## 2017-05-18 NOTE — Progress Notes (Signed)
Critical INR 4.01 received. Will notify MD during rounds. Will continue to monitor.

## 2017-05-18 NOTE — Progress Notes (Signed)
19 Days Post-Op Procedure(s) (LRB): REDO STERNOTOMY (N/A) REPLACEMENT ASCENDING AORTA/ WHEAT PROCEDURE, HYPOTHERMIC CIRCULATORY ARREST AND CARDIOPULMONARY BYPASS, AND USE OF HEMASHIELD PLATINUM WOVEN DOUBLE VELOUR VASCULAR GRAFT 32 MM X 50 CM (N/A) TRANSESOPHAGEAL ECHOCARDIOGRAM (TEE) (N/A) Subjective: No new complaints this AM Thinks diarrhea may be easing off  Objective: Vital signs in last 24 hours: Temp:  [97.7 F (36.5 C)-99.1 F (37.3 C)] 97.9 F (36.6 C) (07/07 0819) Pulse Rate:  [43-131] 78 (07/07 0800) Cardiac Rhythm: Sinus tachycardia;Bundle branch block (07/07 0400) Resp:  [15-29] 15 (07/07 0800) BP: (92-122)/(54-80) 98/58 (07/07 0800) SpO2:  [87 %-100 %] 99 % (07/07 0800) FiO2 (%):  [35 %] 35 % (07/06 2206) Weight:  [146 lb (66.2 kg)] 146 lb (66.2 kg) (07/07 0500)  Hemodynamic parameters for last 24 hours:    Intake/Output from previous day: 07/06 0701 - 07/07 0700 In: 1420.3 [P.O.:685; I.V.:305.3; IV Piggyback:250] Out: 2635 [Urine:1285; Stool:1350] Intake/Output this shift: Total I/O In: 10 [I.V.:10] Out: -   General appearance: alert, cooperative and no distress Neurologic: intact Heart: regular rate and rhythm Lungs: diminished breath sounds bilaterally Abdomen: normal findings: soft, non-tender Wound: clean and dry  Lab Results:  Recent Labs  05/17/17 0335 05/17/17 1644 05/18/17 0406  WBC 6.8  --  8.3  HGB 8.1* 8.5* 8.1*  HCT 26.8* 25.0* 26.9*  PLT 785*  --  703*   BMET:  Recent Labs  05/17/17 0335 05/17/17 1644 05/18/17 0406  NA 139 141 140  K 3.1* 3.6 3.9  CL 106 101 108  CO2 27  --  26  GLUCOSE 124* 117* 119*  BUN 19 18 20   CREATININE 0.74 0.70 0.89  CALCIUM 8.1*  --  8.0*    PT/INR:  Recent Labs  05/18/17 0406  LABPROT 40.6*  INR 4.01*   ABG    Component Value Date/Time   PHART 7.431 05/14/2017 0320   HCO3 26.8 05/14/2017 0320   TCO2 26 05/17/2017 1644   ACIDBASEDEF 3.0 (H) 05/01/2017 0121   O2SAT 93.4 05/14/2017  0320   CBG (last 3)   Recent Labs  05/16/17 0758 05/17/17 0731  GLUCAP 92 117*    Assessment/Plan: S/P Procedure(s) (LRB): REDO STERNOTOMY (N/A) REPLACEMENT ASCENDING AORTA/ WHEAT PROCEDURE, HYPOTHERMIC CIRCULATORY ARREST AND CARDIOPULMONARY BYPASS, AND USE OF HEMASHIELD PLATINUM WOVEN DOUBLE VELOUR VASCULAR GRAFT 32 MM X 50 CM (N/A) TRANSESOPHAGEAL ECHOCARDIOGRAM (TEE) (N/A) -  CV- stable  INR up to 4- hold coumadin again today  RESP- on 2 L [place]  RENAL- creatinine and lytes OK  ENDO- CBG well controlled  GI/ Nutrition- rectal tube in place, diarrhea appears to be lessening, but not completely resolved  Protein calorie malnutrition, albumin 1.9   LOS: 19 days    Loreli Slot 05/18/2017

## 2017-05-19 LAB — POCT I-STAT, CHEM 8
BUN: 14 mg/dL (ref 6–20)
Calcium, Ion: 1.22 mmol/L (ref 1.15–1.40)
Chloride: 102 mmol/L (ref 101–111)
Creatinine, Ser: 0.7 mg/dL (ref 0.61–1.24)
Glucose, Bld: 89 mg/dL (ref 65–99)
HCT: 27 % — ABNORMAL LOW (ref 39.0–52.0)
Hemoglobin: 9.2 g/dL — ABNORMAL LOW (ref 13.0–17.0)
Potassium: 4 mmol/L (ref 3.5–5.1)
Sodium: 141 mmol/L (ref 135–145)
TCO2: 27 mmol/L (ref 0–100)

## 2017-05-19 LAB — PROTIME-INR
INR: 3.43
Prothrombin Time: 35.4 seconds — ABNORMAL HIGH (ref 11.4–15.2)

## 2017-05-19 MED ORDER — ORAL CARE MOUTH RINSE
15.0000 mL | Freq: Two times a day (BID) | OROMUCOSAL | Status: DC
  Administered 2017-05-20 – 2017-05-23 (×7): 15 mL via OROMUCOSAL

## 2017-05-19 MED ORDER — LOPERAMIDE HCL 1 MG/5ML PO LIQD
1.0000 mg | ORAL | Status: DC | PRN
  Administered 2017-05-19 – 2017-05-20 (×2): 1 mg via ORAL
  Filled 2017-05-19 (×4): qty 5

## 2017-05-19 NOTE — Progress Notes (Signed)
20 Days Post-Op Procedure(s) (LRB): REDO STERNOTOMY (N/A) REPLACEMENT ASCENDING AORTA/ WHEAT PROCEDURE, HYPOTHERMIC CIRCULATORY ARREST AND CARDIOPULMONARY BYPASS, AND USE OF HEMASHIELD PLATINUM WOVEN DOUBLE VELOUR VASCULAR GRAFT 32 MM X 50 CM (N/A) TRANSESOPHAGEAL ECHOCARDIOGRAM (TEE) (N/A) Subjective: Feels a little better this AM, still c/o sore buttocks  Objective: Vital signs in last 24 hours: Temp:  [97.5 F (36.4 C)-98.2 F (36.8 C)] 97.5 F (36.4 C) (07/08 0733) Pulse Rate:  [51-99] 85 (07/08 0900) Cardiac Rhythm: Normal sinus rhythm (07/08 0900) Resp:  [13-27] 24 (07/08 0900) BP: (92-117)/(52-67) 93/54 (07/08 0900) SpO2:  [73 %-100 %] 95 % (07/08 0900) Weight:  [150 lb (68 kg)] 150 lb (68 kg) (07/08 0500)  Hemodynamic parameters for last 24 hours:    Intake/Output from previous day: 07/07 0701 - 07/08 0700 In: 120 [I.V.:120] Out: 350 [Urine:350] Intake/Output this shift: Total I/O In: 140 [I.V.:140] Out: 300 [Urine:300]  General appearance: alert, cooperative and no distress Neurologic: intact Heart: regular rate and rhythm Lungs: rhonchi bilaterally Abdomen: normal findings: soft, non-tender Wound: clean and dry  Lab Results:  Recent Labs  05/17/17 0335  05/18/17 0406 05/18/17 1809  WBC 6.8  --  8.3  --   HGB 8.1*  < > 8.1* 8.5*  HCT 26.8*  < > 26.9* 25.0*  PLT 785*  --  703*  --   < > = values in this interval not displayed. BMET:  Recent Labs  05/17/17 0335  05/18/17 0406 05/18/17 1809  NA 139  < > 140 141  K 3.1*  < > 3.9 4.3  CL 106  < > 108 105  CO2 27  --  26  --   GLUCOSE 124*  < > 119* 107*  BUN 19  < > 20 18  CREATININE 0.74  < > 0.89 0.70  CALCIUM 8.1*  --  8.0*  --   < > = values in this interval not displayed.  PT/INR:  Recent Labs  05/19/17 0500  LABPROT 35.4*  INR 3.43   ABG    Component Value Date/Time   PHART 7.431 05/14/2017 0320   HCO3 26.8 05/14/2017 0320   TCO2 28 05/18/2017 1809   ACIDBASEDEF 3.0 (H)  05/01/2017 0121   O2SAT 93.4 05/14/2017 0320   CBG (last 3)   Recent Labs  05/17/17 0731  GLUCAP 117*    Assessment/Plan: S/P Procedure(s) (LRB): REDO STERNOTOMY (N/A) REPLACEMENT ASCENDING AORTA/ WHEAT PROCEDURE, HYPOTHERMIC CIRCULATORY ARREST AND CARDIOPULMONARY BYPASS, AND USE OF HEMASHIELD PLATINUM WOVEN DOUBLE VELOUR VASCULAR GRAFT 32 MM X 50 CM (N/A) TRANSESOPHAGEAL ECHOCARDIOGRAM (TEE) (N/A) -CV- in SR, BP remains relatively low  RESP- sats OK on 2L [place]  RENAL- creatinine and lytes OK  ENDO- CBG well controlled  GI/ Nutrition- diarrhea persists but volume decreased, will try low dose imodium  Deconditioning- mobilize as tolerated   LOS: 20 days    Loreli Slot 05/19/2017

## 2017-05-19 NOTE — Progress Notes (Signed)
      [address]       [name] [address]             [phone]       Ambulated 3 x today  400 ml from flexiseal so far despite imodium  Continue present care  Viviann Spare C. Dorris Fetch, MD [facility] [phone]

## 2017-05-20 ENCOUNTER — Inpatient Hospital Stay (HOSPITAL_COMMUNITY): Payer: Medicare Other

## 2017-05-20 DIAGNOSIS — I340 Nonrheumatic mitral (valve) insufficiency: Secondary | ICD-10-CM

## 2017-05-20 LAB — CBC
HCT: 28.7 % — ABNORMAL LOW (ref 39.0–52.0)
Hemoglobin: 8.5 g/dL — ABNORMAL LOW (ref 13.0–17.0)
MCH: 28.7 pg (ref 26.0–34.0)
MCHC: 29.6 g/dL — AB (ref 30.0–36.0)
MCV: 97 fL (ref 78.0–100.0)
Platelets: 606 10*3/uL — ABNORMAL HIGH (ref 150–400)
RBC: 2.96 MIL/uL — ABNORMAL LOW (ref 4.22–5.81)
RDW: 15 % (ref 11.5–15.5)
WBC: 6.9 10*3/uL (ref 4.0–10.5)

## 2017-05-20 LAB — POCT I-STAT, CHEM 8
BUN: 13 mg/dL (ref 6–20)
Calcium, Ion: 1.19 mmol/L (ref 1.15–1.40)
Chloride: 102 mmol/L (ref 101–111)
Creatinine, Ser: 0.6 mg/dL — ABNORMAL LOW (ref 0.61–1.24)
Glucose, Bld: 103 mg/dL — ABNORMAL HIGH (ref 65–99)
HCT: 27 % — ABNORMAL LOW (ref 39.0–52.0)
Hemoglobin: 9.2 g/dL — ABNORMAL LOW (ref 13.0–17.0)
Potassium: 3.9 mmol/L (ref 3.5–5.1)
Sodium: 141 mmol/L (ref 135–145)
TCO2: 26 mmol/L (ref 0–100)

## 2017-05-20 LAB — BASIC METABOLIC PANEL
Anion gap: 5 (ref 5–15)
BUN: 12 mg/dL (ref 6–20)
CALCIUM: 8.2 mg/dL — AB (ref 8.9–10.3)
CO2: 29 mmol/L (ref 22–32)
Chloride: 104 mmol/L (ref 101–111)
Creatinine, Ser: 0.67 mg/dL (ref 0.61–1.24)
GFR calc non Af Amer: >60 mL/min (ref >60)
Glucose, Bld: 110 mg/dL — ABNORMAL HIGH (ref 65–99)
Potassium: 3.8 mmol/L (ref 3.5–5.1)
SODIUM: 138 mmol/L (ref 135–145)

## 2017-05-20 LAB — ECHOCARDIOGRAM COMPLETE
Height: 70 in
Weight: 2384 oz

## 2017-05-20 LAB — PROTIME-INR
INR: 4.07
Prothrombin Time: 40.7 seconds — ABNORMAL HIGH (ref 11.4–15.2)

## 2017-05-20 NOTE — Progress Notes (Signed)
CRITICAL VALUE ALERT  Critical Value:  INR 4.07  Date & Time Notied:  05/20/17 0430  Provider Notified: Virgina Organ MD  Orders Received/Actions taken: No new orders received.   Marlou Porch

## 2017-05-20 NOTE — Progress Notes (Signed)
Per Rosanne Ashing with [employer]- Life Vest has been approved-

## 2017-05-20 NOTE — Progress Notes (Signed)
Patient examined and record reviewed.Hemodynamics stable,labs satisfactory.Patient had stable day.Continue current care. Kathlee Nations Trigt III 05/20/2017

## 2017-05-20 NOTE — Progress Notes (Signed)
Rehab admissions - Noted plans to discontinue the rectal tube.  Noted loose diarrhea stool in bag this am.  I will continue to follow for progress.  Call me for questions.  #[phone]

## 2017-05-20 NOTE — Progress Notes (Signed)
Discontinue Enteric Precautions per Infection Prevent: Cherrie Speagle  RN will continue to monitor.

## 2017-05-20 NOTE — Progress Notes (Signed)
  Echocardiogram 2D Echocardiogram has been performed.  Jack Joseph 05/20/2017, 2:45 PM

## 2017-05-20 NOTE — Progress Notes (Signed)
Patient ID: Jack Joseph, male   DOB: 1946/01/03, 70 y.o.   MRN: [id number] TCTS DAILY ICU PROGRESS NOTE                   [address]            [place] [address]          [phone]   21 Days Post-Op Procedure(s) (LRB): REDO STERNOTOMY (N/A) REPLACEMENT ASCENDING AORTA/ WHEAT PROCEDURE, HYPOTHERMIC CIRCULATORY ARREST AND CARDIOPULMONARY BYPASS, AND USE OF HEMASHIELD PLATINUM WOVEN DOUBLE VELOUR VASCULAR GRAFT 32 MM X 50 CM (N/A) TRANSESOPHAGEAL ECHOCARDIOGRAM (TEE) (N/A)  Total Length of Stay:  LOS: 21 days   Subjective: Up to chair, voice weak, but alter and less confused     Objective: Vital signs in last 24 hours: Temp:  [97 F (36.1 C)-97.9 F (36.6 C)] 97.9 F (36.6 C) (07/09 0400) Pulse Rate:  [39-95] 95 (07/09 0700) Cardiac Rhythm: Normal sinus rhythm (07/09 0400) Resp:  [12-28] 28 (07/09 0700) BP: (87-132)/(53-70) 132/70 (07/09 0700) SpO2:  [90 %-100 %] 92 % (07/09 0700) Weight:  [149 lb (67.6 kg)] 149 lb (67.6 kg) (07/09 0400)  Filed Weights   05/18/17 0500 05/19/17 0500 05/20/17 0400  Weight: 146 lb (66.2 kg) 150 lb (68 kg) 149 lb (67.6 kg)    Weight change: -1 lb (-0.454 kg)   Hemodynamic parameters for last 24 hours:    Intake/Output from previous day: 07/08 0701 - 07/09 0700 In: 940 [P.O.:590; I.V.:350] Out: 1550 [Urine:1150; Stool:400]  Intake/Output this shift: No intake/output data recorded.  Current Meds: Scheduled Meds: . amiodarone  200 mg Oral Daily  . Chlorhexidine Gluconate Cloth  6 each Topical Daily  . feeding supplement (ENSURE ENLIVE)  237 mL Oral BID BM  . levalbuterol  0.63 mg Nebulization TID  . magic mouthwash  5 mL Oral TID  . mouth rinse  15 mL Mouth Rinse BID  . pantoprazole  40 mg Oral Daily  . potassium chloride  20 mEq Oral Daily  . sodium chloride flush  10-40 mL Intracatheter Q12H  . sodium chloride flush  3 mL Intravenous Q12H  . Warfarin - Physician Dosing Inpatient   Does not apply q1800     Continuous Infusions: . sodium chloride 10 mL/hr at 05/20/17 0600   PRN Meds:.diphenhydrAMINE, guaiFENesin, levalbuterol, loperamide, ondansetron (ZOFRAN) IV, oxyCODONE, sodium chloride flush, sodium chloride flush  General appearance: alert and cooperative Neurologic: intact Heart: regular rate and rhythm, S1, S2 normal, no murmur, click, rub or gallop Lungs: diminished breath sounds LLL Abdomen: soft, non-tender; bowel sounds normal; no masses,  no organomegaly Extremities: extremities normal, atraumatic, no cyanosis or edema and Homans sign is negative, no sign of DVT Wound: sternum intact  Lab Results: CBC: Recent Labs  05/18/17 0406  05/19/17 1717 05/20/17 0321  WBC 8.3  --   --  6.9  HGB 8.1*  < > 9.2* 8.5*  HCT 26.9*  < > 27.0* 28.7*  PLT 703*  --   --  606*  < > = values in this interval not displayed. BMET:  Recent Labs  05/18/17 0406  05/19/17 1717 05/20/17 0321  NA 140  < > 141 138  K 3.9  < > 4.0 3.8  CL 108  < > 102 104  CO2 26  --   --  29  GLUCOSE 119*  < > 89 110*  BUN 20  < > 14 12  CREATININE 0.89  < >  0.70 0.67  CALCIUM 8.0*  --   --  8.2*  < > = values in this interval not displayed.  CMET: Lab Results  Component Value Date   WBC 6.9 05/20/2017   HGB 8.5 (L) 05/20/2017   HCT 28.7 (L) 05/20/2017   PLT 606 (H) 05/20/2017   GLUCOSE 110 (H) 05/20/2017   CHOL 199 01/22/2017   TRIG 71 01/22/2017   HDL 64 01/22/2017   LDLCALC 121 (H) 01/22/2017   ALT 41 05/18/2017   AST 24 05/18/2017   NA 138 05/20/2017   K 3.8 05/20/2017   CL 104 05/20/2017   CREATININE 0.67 05/20/2017   BUN 12 05/20/2017   CO2 29 05/20/2017   TSH 4.065 05/09/2017   INR 4.07 (HH) 05/20/2017   HGBA1C 5.7 (H) 04/25/2017      PT/INR:  Recent Labs  05/20/17 0321  LABPROT 40.7*  INR 4.07*   Radiology: No results found.   Assessment/Plan: S/P Procedure(s) (LRB): REDO STERNOTOMY (N/A) REPLACEMENT ASCENDING AORTA/ WHEAT PROCEDURE, HYPOTHERMIC CIRCULATORY ARREST  AND CARDIOPULMONARY BYPASS, AND USE OF HEMASHIELD PLATINUM WOVEN DOUBLE VELOUR VASCULAR GRAFT 32 MM X 50 CM (N/A) TRANSESOPHAGEAL ECHOCARDIOGRAM (TEE) (N/A) Mobilize Decreasing amt of diarrhea inr up to 4.07, holding coumadin today D/c flex seal monitor diarrhea     Jack Joseph 05/20/2017 7:29 AM

## 2017-05-20 NOTE — Progress Notes (Signed)
Physical Therapy Treatment Patient Details Name: Jack Joseph Character MRN: [id number] DOB: 1946/07/13 Today's Date: 05/20/2017    History of Present Illness 71 y.o. male with aortic root replacement and CABG with VT arrest post op and VDRF with intubations x 3.  HCAP, Right chest tube with PTX.  PMH:   AVR 1983 on chronic coumadin, COPD (FEV1 1.14 (35%), FEV1/FVC 47), LV dysfunction with EF 30-35%    PT Comments    Patient required min A to stand to maintain sternal precautions. Pt ambulated short distance limited by loose stool. Continue to progress as tolerated.    Follow Up Recommendations  CIR;Supervision/Assistance - 24 hour     Equipment Recommendations  Other (comment) (TBA)    Recommendations for Other Services Rehab consult;OT consult     Precautions / Restrictions Precautions Precautions: Fall;Sternal Precaution Comments: reviewed precautions with pt Restrictions Weight Bearing Restrictions: No    Mobility  Bed Mobility Overal bed mobility: Needs Assistance Bed Mobility: Rolling;Sit to Sidelying Rolling: Min guard       Sit to sidelying: Min assist General bed mobility comments: pt sitting in chair upon arrival; assist to bring bilat LE into bed  Transfers Overall transfer level: Needs assistance Equipment used: Rolling walker (2 wheeled) Transfers: Sit to/from Stand Sit to Stand: Min assist         General transfer comment: assist to power up into standing; cues for maintaining sternal precautions and pt used heart pillow  Ambulation/Gait Ambulation/Gait assistance: +2 safety/equipment;Min assist Ambulation Distance (Feet): 15 Feet Assistive device: Rolling walker (2 wheeled) Gait Pattern/deviations: Decreased stride length;Trunk flexed;Wide base of support;Step-through pattern     General Gait Details: cues for posture and decreased weightbearing on RW; limited by loose stool   Stairs            Wheelchair Mobility    Modified Rankin  (Stroke Patients Only)       Balance Overall balance assessment: Needs assistance Sitting-balance support: No upper extremity supported;Feet supported Sitting balance-Leahy Scale: Fair     Standing balance support: Bilateral upper extremity supported;During functional activity Standing balance-Leahy Scale: Poor                              Cognition Arousal/Alertness: Awake/alert Behavior During Therapy: WFL for tasks assessed/performed Overall Cognitive Status: Within Functional Limits for tasks assessed                                        Exercises      General Comments General comments (skin integrity, edema, etc.): wife present      Pertinent Vitals/Pain Pain Assessment: Faces Faces Pain Scale: Hurts a little bit Pain Location: generalized Pain Descriptors / Indicators: Discomfort Pain Intervention(s): Monitored during session    Home Living                      Prior Function            PT Goals (current goals can now be found in the care plan section) Progress towards PT goals: Progressing toward goals    Frequency    Min 3X/week      PT Plan Current plan remains appropriate    Co-evaluation              AM-PAC PT "6 Clicks" Daily Activity  Outcome Measure  Difficulty turning over in bed (including adjusting bedclothes, sheets and blankets)?: A Little Difficulty moving from lying on back to sitting on the side of the bed? : Total Difficulty sitting down on and standing up from a chair with arms (e.g., wheelchair, bedside commode, etc,.)?: Total Help needed moving to and from a bed to chair (including a wheelchair)?: A Little Help needed walking in hospital room?: A Little Help needed climbing 3-5 steps with a railing? : A Lot 6 Click Score: 13    End of Session Equipment Utilized During Treatment: Gait belt;Oxygen Activity Tolerance: Patient limited by fatigue Patient left: with call bell/phone  within reach;with family/visitor present;in bed Nurse Communication: Mobility status PT Visit Diagnosis: Unsteadiness on feet (R26.81);Muscle weakness (generalized) (M62.81)     Time: 4098-1191 PT Time Calculation (min) (ACUTE ONLY): 33 min  Charges:  $Gait Training: 8-22 mins $Therapeutic Activity: 8-22 mins                    G Codes:       Jack Joseph, PTA Pager: [phone]     Jack Joseph 05/20/2017, 1:15 PM

## 2017-05-21 ENCOUNTER — Inpatient Hospital Stay (HOSPITAL_COMMUNITY): Payer: Medicare Other

## 2017-05-21 LAB — BASIC METABOLIC PANEL
Anion gap: 6 (ref 5–15)
BUN: 13 mg/dL (ref 6–20)
CO2: 29 mmol/L (ref 22–32)
Calcium: 8.3 mg/dL — ABNORMAL LOW (ref 8.9–10.3)
Chloride: 102 mmol/L (ref 101–111)
Creatinine, Ser: 0.69 mg/dL (ref 0.61–1.24)
GFR calc Af Amer: >60 mL/min (ref >60)
GFR calc non Af Amer: >60 mL/min (ref >60)
Glucose, Bld: 98 mg/dL (ref 65–99)
Potassium: 3.8 mmol/L (ref 3.5–5.1)
Sodium: 137 mmol/L (ref 135–145)

## 2017-05-21 LAB — POCT I-STAT, CHEM 8
BUN: 12 mg/dL (ref 6–20)
Calcium, Ion: 1.18 mmol/L (ref 1.15–1.40)
Chloride: 99 mmol/L — ABNORMAL LOW (ref 101–111)
Creatinine, Ser: 0.6 mg/dL — ABNORMAL LOW (ref 0.61–1.24)
Glucose, Bld: 106 mg/dL — ABNORMAL HIGH (ref 65–99)
HCT: 30 % — ABNORMAL LOW (ref 39.0–52.0)
Hemoglobin: 10.2 g/dL — ABNORMAL LOW (ref 13.0–17.0)
Potassium: 4.1 mmol/L (ref 3.5–5.1)
Sodium: 138 mmol/L (ref 135–145)
TCO2: 29 mmol/L (ref 0–100)

## 2017-05-21 LAB — CBC
HCT: 30.2 % — ABNORMAL LOW (ref 39.0–52.0)
Hemoglobin: 9.1 g/dL — ABNORMAL LOW (ref 13.0–17.0)
MCH: 28.5 pg (ref 26.0–34.0)
MCHC: 30.1 g/dL (ref 30.0–36.0)
MCV: 94.7 fL (ref 78.0–100.0)
Platelets: 606 10*3/uL — ABNORMAL HIGH (ref 150–400)
RBC: 3.19 MIL/uL — ABNORMAL LOW (ref 4.22–5.81)
RDW: 14.8 % (ref 11.5–15.5)
WBC: 8.7 10*3/uL (ref 4.0–10.5)

## 2017-05-21 LAB — PROTIME-INR
INR: 3.23
Prothrombin Time: 33.7 seconds — ABNORMAL HIGH (ref 11.4–15.2)

## 2017-05-21 MED ORDER — CHLORHEXIDINE GLUCONATE CLOTH 2 % EX PADS
6.0000 | MEDICATED_PAD | Freq: Every day | CUTANEOUS | Status: DC
  Administered 2017-05-22 – 2017-05-23 (×2): 6 via TOPICAL

## 2017-05-21 MED ORDER — STARCH (THICKENING) PO POWD
ORAL | Status: DC | PRN
  Filled 2017-05-21: qty 227

## 2017-05-21 NOTE — Progress Notes (Signed)
Rehab admissions - Awaiting medical stability per attending MD prior to inpatient rehab admission.  Diarrhea better.  Call me for questions.  #[phone]

## 2017-05-21 NOTE — Progress Notes (Signed)
CSW provided list of SNF offers to patient and patient wife for review- they continue to express strong preference for CIR admission if possible but will review list of options in case CIR unable to admit.  CSW will continue to follow  Burna Sis, LCSW Clinical Social Worker [phone]

## 2017-05-21 NOTE — Progress Notes (Signed)
   05/21/17 1159  Clinical Encounter Type  Visited With Patient;Family  Visit Type Initial;Psychological support;Spiritual support;Social support  Referral From Chaplain  Consult/Referral To Chaplain   Chaplain stopped by while rounding the floor. Patient sitting in wheelchair, spouse by his side.  Spouse immediately shares that they have two pastors who have been with them everyday but thanks me for coming by.  Chaplain will remain available as needed.

## 2017-05-21 NOTE — Progress Notes (Signed)
Patient ID: Jack Joseph, male   DOB: 06-20-1946, 71 y.o.   MRN: [id number] TCTS DAILY ICU PROGRESS NOTE                   [address]            [place] [address]          [phone]   22 Days Post-Op Procedure(s) (LRB): REDO STERNOTOMY (N/A) REPLACEMENT ASCENDING AORTA/ WHEAT PROCEDURE, HYPOTHERMIC CIRCULATORY ARREST AND CARDIOPULMONARY BYPASS, AND USE OF HEMASHIELD PLATINUM WOVEN DOUBLE VELOUR VASCULAR GRAFT 32 MM X 50 CM (N/A) TRANSESOPHAGEAL ECHOCARDIOGRAM (TEE) (N/A)  Total Length of Stay:  LOS: 22 days   Subjective: Alert ,less confused   Objective: Vital signs in last 24 hours: Temp:  [97.5 F (36.4 C)-97.7 F (36.5 C)] 97.6 F (36.4 C) (07/10 0400) Pulse Rate:  [42-96] 45 (07/10 0700) Cardiac Rhythm: Normal sinus rhythm (07/10 0400) Resp:  [14-30] 25 (07/10 0700) BP: (95-133)/(56-82) 116/82 (07/10 0700) SpO2:  [92 %-100 %] 98 % (07/10 0700) Weight:  [143 lb (64.9 kg)] 143 lb (64.9 kg) (07/10 0400)  Filed Weights   05/19/17 0500 05/20/17 0400 05/21/17 0400  Weight: 150 lb (68 kg) 149 lb (67.6 kg) 143 lb (64.9 kg)    Weight change: -6 lb (-2.722 kg)   Hemodynamic parameters for last 24 hours:    Intake/Output from previous day: 07/09 0701 - 07/10 0700 In: 370 [P.O.:120; I.V.:250] Out: 450 [Urine:450]  Intake/Output this shift: No intake/output data recorded.  Current Meds: Scheduled Meds: . amiodarone  200 mg Oral Daily  . [START ON 05/22/2017] Chlorhexidine Gluconate Cloth  6 each Topical Daily  . feeding supplement (ENSURE ENLIVE)  237 mL Oral BID BM  . levalbuterol  0.63 mg Nebulization TID  . magic mouthwash  5 mL Oral TID  . mouth rinse  15 mL Mouth Rinse BID  . pantoprazole  40 mg Oral Daily  . potassium chloride  20 mEq Oral Daily  . sodium chloride flush  10-40 mL Intracatheter Q12H  . sodium chloride flush  3 mL Intravenous Q12H  . Warfarin - Physician Dosing Inpatient   Does not apply q1800   Continuous  Infusions: . sodium chloride 10 mL/hr at 05/21/17 0700   PRN Meds:.diphenhydrAMINE, guaiFENesin, levalbuterol, loperamide, ondansetron (ZOFRAN) IV, oxyCODONE, sodium chloride flush, sodium chloride flush  General appearance: alert and cooperative Neurologic: intact Heart: regular rate and rhythm, S1, S2 normal, no murmur, click, rub or gallop Lungs: diminished breath sounds bibasilar Abdomen: soft, non-tender; bowel sounds normal; no masses,  no organomegaly Extremities: extremities normal, atraumatic, no cyanosis or edema and Homans sign is negative, no sign of DVT Wound: sternum stable  Lab Results: CBC: Recent Labs  05/20/17 0321 05/20/17 1734 05/21/17 0341  WBC 6.9  --  8.7  HGB 8.5* 9.2* 9.1*  HCT 28.7* 27.0* 30.2*  PLT 606*  --  606*   BMET:  Recent Labs  05/20/17 0321 05/20/17 1734 05/21/17 0341  NA 138 141 137  K 3.8 3.9 3.8  CL 104 102 102  CO2 29  --  29  GLUCOSE 110* 103* 98  BUN 12 13 13   CREATININE 0.67 0.60* 0.69  CALCIUM 8.2*  --  8.3*    CMET: Lab Results  Component Value Date   WBC 8.7 05/21/2017   HGB 9.1 (L) 05/21/2017   HCT 30.2 (L) 05/21/2017   PLT 606 (H) 05/21/2017   GLUCOSE 98 05/21/2017   CHOL 199  01/22/2017   TRIG 71 01/22/2017   HDL 64 01/22/2017   LDLCALC 121 (H) 01/22/2017   ALT 41 05/18/2017   AST 24 05/18/2017   NA 137 05/21/2017   K 3.8 05/21/2017   CL 102 05/21/2017   CREATININE 0.69 05/21/2017   BUN 13 05/21/2017   CO2 29 05/21/2017   TSH 4.065 05/09/2017   INR 3.23 05/21/2017   HGBA1C 5.7 (H) 04/25/2017      PT/INR:  Recent Labs  05/21/17 0341  LABPROT 33.7*  INR 3.23   Radiology: No results found.   Assessment/Plan: S/P Procedure(s) (LRB): REDO STERNOTOMY (N/A) REPLACEMENT ASCENDING AORTA/ WHEAT PROCEDURE, HYPOTHERMIC CIRCULATORY ARREST AND CARDIOPULMONARY BYPASS, AND USE OF HEMASHIELD PLATINUM WOVEN DOUBLE VELOUR VASCULAR GRAFT 32 MM X 50 CM (N/A) TRANSESOPHAGEAL ECHOCARDIOGRAM (TEE)  (N/A) Mobilize Diuresis Echo done, decreased lv function still , documented for many years 30% To step down soon then rehab Loose stools stopped  No coumadin today    [name] 05/21/2017 7:32 AM

## 2017-05-21 NOTE — Progress Notes (Signed)
Occupational Therapy Treatment Patient Details Name: Jack Joseph MRN: [id number] DOB: 01-Jan-1946 Today's Date: 05/21/2017    History of present illness 71 y.o. male with aortic root replacement and CABG with VT arrest post op and VDRF with intubations x 3.  HCAP, Right chest tube with PTX.  PMH:   AVR 1983 on chronic coumadin, COPD (FEV1 1.14 (35%), FEV1/FVC 47), LV dysfunction with EF 30-35%   OT comments  Pt requires min A - mod A for ADLs.  VSS, DOE 3/4.  Will continue to follow.   Follow Up Recommendations  CIR;Supervision/Assistance - 24 hour    Equipment Recommendations  3 in 1 bedside commode    Recommendations for Other Services      Precautions / Restrictions Precautions Precautions: Fall;Sternal Precaution Comments: requires min cues for precautions        Mobility Bed Mobility Overal bed mobility: Needs Assistance Bed Mobility: Supine to Sit;Sit to Supine Rolling: Min guard Sidelying to sit: Min guard          Transfers Overall transfer level: Needs assistance Equipment used: Rolling walker (2 wheeled) Transfers: Sit to/from [employer] Sit to Stand: Min assist Stand pivot transfers: Min assist       General transfer comment: assist for balance     Balance Overall balance assessment: Needs assistance Sitting-balance support: No upper extremity supported;Feet supported Sitting balance-Leahy Scale: Fair     Standing balance support: Bilateral upper extremity supported;During functional activity Standing balance-Leahy Scale: Poor Standing balance comment: reliant on UE support.  Pt able to statically stand with bil. UE support x 6 mins with min guard assist                            ADL either performed or assessed with clinical judgement   ADL Overall ADL's : Needs assistance/impaired                         Toilet Transfer: Minimal assistance;BSC;RW;Stand-pivot   Toileting- Clothing Manipulation  and Hygiene: Moderate assistance;Sit to/from stand       Functional mobility during ADLs: Minimal assistance;Rolling walker       Vision       Perception     Praxis      Cognition Arousal/Alertness: Awake/alert Behavior During Therapy: WFL for tasks assessed/performed Overall Cognitive Status: Within Functional Limits for tasks assessed                                          Exercises     Shoulder Instructions       General Comments VSS DOE 3/4     Pertinent Vitals/ Pain       Pain Assessment: No/denies pain  Home Living                                          Prior Functioning/Environment              Frequency  Min 2X/week        Progress Toward Goals  OT Goals(current goals can now be found in the care plan section)  Progress towards OT goals: Progressing toward goals     Plan Discharge plan remains appropriate  Co-evaluation                 AM-PAC PT "6 Clicks" Daily Activity     Outcome Measure   Help from another person eating meals?: A Little Help from another person taking care of personal grooming?: A Little Help from another person toileting, which includes using toliet, bedpan, or urinal?: A Lot Help from another person bathing (including washing, rinsing, drying)?: A Lot Help from another person to put on and taking off regular upper body clothing?: A Little Help from another person to put on and taking off regular lower body clothing?: A Lot 6 Click Score: 15    End of Session    OT Visit Diagnosis: Unsteadiness on feet (R26.81);Cognitive communication deficit (W09.811)   Activity Tolerance     Patient Left     Nurse Communication          Time: 9147-8295 OT Time Calculation (min): 33 min  Charges: OT General Charges $OT Visit: 1 Procedure OT Treatments $Self Care/Home Management : 23-37 mins  [employer], OTR/L [phone]    Jeani Hawking M 05/21/2017, 5:14  PM

## 2017-05-21 NOTE — Progress Notes (Signed)
Modified Barium Swallow Progress Note  Patient Details  Name: Jack Joseph MRN: [id number] Date of Birth: [birth year]  Today's Date: 05/21/2017  Modified Barium Swallow completed.  Full report located under Chart Review in the Imaging Section.  Brief recommendations include the following:  Clinical Impression  Swallow function remains consistent with last week's MBS - pt continues to demonstrate consistent aspiration of thin liquids due to decreased laryngeal closure.  Postural adjustments - head turns, head tilts, and a chin tuck - were NOT effective to eliminate aspiration. Oral mastication, pharyngeal clearance were WFL, and all other consistencies were consumed safely.   Recommend advancing diet to regular solids; continue nectar-thick liquids only.  Will initiate therapeutic exercise to focus on improved laryngeal closure; recommend f/u FEES rather than MBS to visualize the larynx.  Results/recs discussed with Mr. and [name].    Swallow Evaluation Recommendations       SLP Diet Recommendations: Regular solids;Nectar thick liquid   Liquid Administration via: Cup   Medication Administration: Whole meds with puree   Supervision: Patient able to self feed   Compensations: Slow rate;Small sips/bites       Oral Care Recommendations: Oral care BID        [name] [name] 05/21/2017,11:23 AM

## 2017-05-21 NOTE — Progress Notes (Signed)
TCTS BRIEF SICU PROGRESS NOTE  22 Days Post-Op  S/P Procedure(s) (LRB): REDO STERNOTOMY (N/A) REPLACEMENT ASCENDING AORTA/ WHEAT PROCEDURE, HYPOTHERMIC CIRCULATORY ARREST AND CARDIOPULMONARY BYPASS, AND USE OF HEMASHIELD PLATINUM WOVEN DOUBLE VELOUR VASCULAR GRAFT 32 MM X 50 CM (N/A) TRANSESOPHAGEAL ECHOCARDIOGRAM (TEE) (N/A)   Stable day  Plan: Continue current plan  Purcell Nails, MD 05/21/2017 7:27 PM

## 2017-05-21 NOTE — Progress Notes (Signed)
Nutrition Follow-up  INTERVENTION:   D/C ensure enlive  Mighty Shake II at Breakfast, each supplement provides 480-500 kcals and 20-23 grams of protein  Magic cup TID with meals, each supplement provides 290 kcal and 9 grams of protein   NUTRITION DIAGNOSIS:   Increased nutrient needs related to wound healing as evidenced by estimated needs. Ongoing.   GOAL:   Patient will meet greater than or equal to 90% of their needs Progressing.   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  Arrest 6/20.   Pt with soft voice. Pt reports eating < 50% of his meal trays. He does not like the ensure.  Loose stools have stopped per MD note, rectal tube removed 7/9  Medications reviewed and include: KCl 20 mEq daily, magic mouthwash K+ 3.8 WNL Weight now at usual wt range  Diet Order:  Diet Heart Room service appropriate? Yes; Fluid consistency: Nectar Thick  Skin:   (MASD buttocks)  Last BM:  7/9 medium  Height:   Ht Readings from Last 1 Encounters:  05/21/17 5\' 10"  (1.778 m)    Weight:   Wt Readings from Last 1 Encounters:  05/21/17 143 lb (64.9 kg)    Ideal Body Weight:  75.4 kg  BMI:  Body mass index is 20.52 kg/m.  Estimated Nutritional Needs:   Kcal:  1800-2000  Protein:  100-115 grams  Fluid:  > 1.7 L/day  EDUCATION NEEDS:   No education needs identified at this time  Kendell Bane RD, LDN, CNSC [phone] Pager [phone] After Hours Pager

## 2017-05-22 LAB — POCT I-STAT, CHEM 8
BUN: 10 mg/dL (ref 6–20)
Calcium, Ion: 1.23 mmol/L (ref 1.15–1.40)
Chloride: 96 mmol/L — ABNORMAL LOW (ref 101–111)
Creatinine, Ser: 0.6 mg/dL — ABNORMAL LOW (ref 0.61–1.24)
Glucose, Bld: 105 mg/dL — ABNORMAL HIGH (ref 65–99)
HCT: 32 % — ABNORMAL LOW (ref 39.0–52.0)
Hemoglobin: 10.9 g/dL — ABNORMAL LOW (ref 13.0–17.0)
Potassium: 4.2 mmol/L (ref 3.5–5.1)
Sodium: 138 mmol/L (ref 135–145)
TCO2: 29 mmol/L (ref 0–100)

## 2017-05-22 LAB — PROTIME-INR
INR: 2.96
Prothrombin Time: 31.5 s — ABNORMAL HIGH (ref 11.4–15.2)

## 2017-05-22 MED ORDER — WARFARIN SODIUM 2 MG PO TABS
1.0000 mg | ORAL_TABLET | Freq: Once | ORAL | Status: AC
  Administered 2017-05-22: 1 mg via ORAL
  Filled 2017-05-22: qty 0.5

## 2017-05-22 MED ORDER — BISACODYL 10 MG RE SUPP: 10.0000 mg | Freq: Every day | RECTAL | Status: DC | PRN

## 2017-05-22 MED ORDER — SODIUM CHLORIDE 0.9% FLUSH: 3.0000 mL | INTRAVENOUS | Status: DC | PRN

## 2017-05-22 MED ORDER — BISACODYL 5 MG PO TBEC: 10.0000 mg | DELAYED_RELEASE_TABLET | Freq: Every day | ORAL | Status: DC | PRN

## 2017-05-22 MED ORDER — ONDANSETRON HCL 4 MG/2ML IJ SOLN: 4.0000 mg | Freq: Four times a day (QID) | INTRAMUSCULAR | Status: DC | PRN

## 2017-05-22 MED ORDER — ONDANSETRON HCL 4 MG PO TABS: 4.0000 mg | ORAL_TABLET | Freq: Four times a day (QID) | ORAL | Status: DC | PRN

## 2017-05-22 MED ORDER — MOVING RIGHT ALONG BOOK
Freq: Once | Status: DC
  Filled 2017-05-22: qty 1

## 2017-05-22 MED ORDER — SODIUM CHLORIDE 0.9% FLUSH: 3.0000 mL | Freq: Two times a day (BID) | INTRAVENOUS | Status: DC

## 2017-05-22 MED ORDER — SODIUM CHLORIDE 0.9 % IV SOLN: 250.0000 mL | INTRAVENOUS | Status: DC | PRN

## 2017-05-22 NOTE — Progress Notes (Signed)
Patient ID: Jack Joseph, male   DOB: 1945-11-15, 71 y.o.   MRN: [id number] TCTS DAILY ICU PROGRESS NOTE                   [address]            [place] [address]          [phone]   23 Days Post-Op Procedure(s) (LRB): REDO STERNOTOMY (N/A) REPLACEMENT ASCENDING AORTA/ WHEAT PROCEDURE, HYPOTHERMIC CIRCULATORY ARREST AND CARDIOPULMONARY BYPASS, AND USE OF HEMASHIELD PLATINUM WOVEN DOUBLE VELOUR VASCULAR GRAFT 32 MM X 50 CM (N/A) TRANSESOPHAGEAL ECHOCARDIOGRAM (TEE) (N/A)  Total Length of Stay:  LOS: 23 days   Subjective: Alert, walked in unti last pm  Objective: Vital signs in last 24 hours: Temp:  [97 F (36.1 C)-98.3 F (36.8 C)] 97.5 F (36.4 C) (07/11 0348) Pulse Rate:  [80-99] 99 (07/11 0600) Cardiac Rhythm: Normal sinus rhythm (07/11 0400) Resp:  [13-28] 26 (07/11 0600) BP: (95-134)/(57-76) 134/71 (07/11 0600) SpO2:  [91 %-99 %] 92 % (07/11 0600) Weight:  [144 lb 4.8 oz (65.5 kg)] 144 lb 4.8 oz (65.5 kg) (07/11 0500)  Filed Weights   05/20/17 0400 05/21/17 0400 05/22/17 0500  Weight: 149 lb (67.6 kg) 143 lb (64.9 kg) 144 lb 4.8 oz (65.5 kg)    Weight change: 1 lb 4.8 oz (0.59 kg)   Hemodynamic parameters for last 24 hours:    Intake/Output from previous day: 07/10 0701 - 07/11 0700 In: 200 [P.O.:160; I.V.:40] Out: 550 [Urine:550]  Intake/Output this shift: No intake/output data recorded.  Current Meds: Scheduled Meds: . amiodarone  200 mg Oral Daily  . Chlorhexidine Gluconate Cloth  6 each Topical Daily  . levalbuterol  0.63 mg Nebulization TID  . magic mouthwash  5 mL Oral TID  . mouth rinse  15 mL Mouth Rinse BID  . pantoprazole  40 mg Oral Daily  . potassium chloride  20 mEq Oral Daily  . sodium chloride flush  10-40 mL Intracatheter Q12H  . sodium chloride flush  3 mL Intravenous Q12H  . Warfarin - Physician Dosing Inpatient   Does not apply q1800   Continuous Infusions: . sodium chloride 10 mL/hr at 05/21/17 0700    PRN Meds:.diphenhydrAMINE, food thickener, guaiFENesin, levalbuterol, loperamide, ondansetron (ZOFRAN) IV, oxyCODONE, sodium chloride flush, sodium chloride flush  General appearance: alert and cooperative Neurologic: intact Heart: regular rate and rhythm, S1, S2 normal, no murmur, click, rub or gallop Lungs: diminished breath sounds bibasilar Abdomen: soft, non-tender; bowel sounds normal; no masses,  no organomegaly Extremities: extremities normal, atraumatic, no cyanosis or edema and Homans sign is negative, no sign of DVT Wound: sternum stable  Lab Results: CBC: Recent Labs  05/20/17 0321  05/21/17 0341 05/21/17 1611  WBC 6.9  --  8.7  --   HGB 8.5*  < > 9.1* 10.2*  HCT 28.7*  < > 30.2* 30.0*  PLT 606*  --  606*  --   < > = values in this interval not displayed. BMET:  Recent Labs  05/20/17 0321  05/21/17 0341 05/21/17 1611  NA 138  < > 137 138  K 3.8  < > 3.8 4.1  CL 104  < > 102 99*  CO2 29  --  29  --   GLUCOSE 110*  < > 98 106*  BUN 12  < > 13 12  CREATININE 0.67  < > 0.69 0.60*  CALCIUM 8.2*  --  8.3*  --   < > =  values in this interval not displayed.  CMET: Lab Results  Component Value Date   WBC 8.7 05/21/2017   HGB 10.2 (L) 05/21/2017   HCT 30.0 (L) 05/21/2017   PLT 606 (H) 05/21/2017   GLUCOSE 106 (H) 05/21/2017   CHOL 199 01/22/2017   TRIG 71 01/22/2017   HDL 64 01/22/2017   LDLCALC 121 (H) 01/22/2017   ALT 41 05/18/2017   AST 24 05/18/2017   NA 138 05/21/2017   K 4.1 05/21/2017   CL 99 (L) 05/21/2017   CREATININE 0.60 (L) 05/21/2017   BUN 12 05/21/2017   CO2 29 05/21/2017   TSH 4.065 05/09/2017   INR 2.96 05/22/2017   HGBA1C 5.7 (H) 04/25/2017      PT/INR:  Recent Labs  05/22/17 0456  LABPROT 31.5*  INR 2.96   Radiology: Assessment/Plan: S/P Procedure(s) (LRB): REDO STERNOTOMY (N/A) REPLACEMENT ASCENDING AORTA/ WHEAT PROCEDURE, HYPOTHERMIC CIRCULATORY ARREST AND CARDIOPULMONARY BYPASS, AND USE OF HEMASHIELD PLATINUM WOVEN  DOUBLE VELOUR VASCULAR GRAFT 32 MM X 50 CM (N/A) TRANSESOPHAGEAL ECHOCARDIOGRAM (TEE) (N/A) Mobilize Plan for transfer to step-down: see transfer orders Plan to ip rehab in next several days     [name] 05/22/2017 8:29 AM

## 2017-05-22 NOTE — Progress Notes (Signed)
Patient ID: Jack Joseph, male   DOB: Nov 01, 1946, 71 y.o.   MRN: [id number] EVENING ROUNDS NOTE :     [address]       [place] [address]             [phone]                 23 Days Post-Op Procedure(s) (LRB): REDO STERNOTOMY (N/A) REPLACEMENT ASCENDING AORTA/ WHEAT PROCEDURE, HYPOTHERMIC CIRCULATORY ARREST AND CARDIOPULMONARY BYPASS, AND USE OF HEMASHIELD PLATINUM WOVEN DOUBLE VELOUR VASCULAR GRAFT 32 MM X 50 CM (N/A) TRANSESOPHAGEAL ECHOCARDIOGRAM (TEE) (N/A)  Total Length of Stay:  LOS: 23 days  BP 111/62   Pulse 94   Temp 97.6 F (36.4 C) (Oral)   Resp (!) 33   Ht 5\' 10"  (1.778 m)   Wt 144 lb 4.8 oz (65.5 kg)   SpO2 98%   BMI 20.70 kg/m   .Intake/Output      07/11 0701 - 07/12 0700   P.O.    I.V. (mL/kg)    Total Intake(mL/kg)    Urine (mL/kg/hr) 250 (0.3)   Stool 0 (0)   Total Output 250   Net -250       Urine Occurrence 1 x   Stool Occurrence 1 x     . sodium chloride 10 mL/hr at 05/21/17 0700     Lab Results  Component Value Date   WBC 8.7 05/21/2017   HGB 10.9 (L) 05/22/2017   HCT 32.0 (L) 05/22/2017   PLT 606 (H) 05/21/2017   GLUCOSE 105 (H) 05/22/2017   CHOL 199 01/22/2017   TRIG 71 01/22/2017   HDL 64 01/22/2017   LDLCALC 121 (H) 01/22/2017   ALT 41 05/18/2017   AST 24 05/18/2017   NA 138 05/22/2017   K 4.2 05/22/2017   CL 96 (L) 05/22/2017   CREATININE 0.60 (L) 05/22/2017   BUN 10 05/22/2017   CO2 29 05/21/2017   TSH 4.065 05/09/2017   INR 2.96 05/22/2017   HGBA1C 5.7 (H) 04/25/2017   Stable day , waitng for stepdown bed, but if no bed may go directly to rehab  [name] MD  Beeper [phone] Office [phone] 05/22/2017 8:02 PM

## 2017-05-22 NOTE — Care Management Note (Signed)
Case Management Note  Patient Details  Name: Jack Joseph MRN: [id number] Date of Birth: [birth year]  Subjective/Objective:    From home with wife, s/p Redo Sternotomy, aorta ascending replacement.  He will transfer to SDU today, plan is for CIR in next several days per MD.   Patient's wife states they have a supplemental policy , [employer] ID number is [id number], phone number is [phone].  Patient has life vest in room.                 Action/Plan: Mobilize, transfer to SDU, plan for CIR in couple days.  Expected Discharge Date:                  Expected Discharge Plan:  IP Rehab Facility  In-House Referral:  Clinical Social Work  Discharge planning Services  CM Consult  Post Acute Care Choice:  Durable Medical Equipment Choice offered to:     DME Arranged:  Life vest DME Agency:  [facility]  HH Arranged:    HH Agency:     Status of Service:  In process, will continue to follow  If discussed at Long Length of Stay Meetings, dates discussed:    Additional Comments:  Leone Haven, RN 05/22/2017, 10:36 AM

## 2017-05-22 NOTE — Progress Notes (Signed)
PT Cancellation Note  Patient Details Name: Tyrrell Pasztor MRN: [id number] DOB: 10/24/1946   Cancelled Treatment:    Reason Eval/Treat Not Completed: Patient declined, no reason specified Pt declined due to fatigue. Pt and daughter reported pt had ambulated this am with nursing staff. PT will check on pt later as time allows.    Derek Mound, PTA Pager: [phone]   05/22/2017, 3:02 PM

## 2017-05-22 NOTE — Progress Notes (Signed)
I met with pt at bedside with his wife and daughter. An inpt rehab bed is available to admit this pt to tomorrow. Please clarify if pt can be d/c'd to inpt rehab on Thursday. I will follow up in the morning. [phone]

## 2017-05-23 ENCOUNTER — Inpatient Hospital Stay (HOSPITAL_COMMUNITY): Payer: Medicare Other

## 2017-05-23 ENCOUNTER — Inpatient Hospital Stay (HOSPITAL_COMMUNITY)
Admission: RE | Admit: 2017-05-23 | Discharge: 2017-05-31 | DRG: 948 | Disposition: A | Discharge status: Home Health | Payer: Medicare Other | Source: Intra-hospital | Attending: Physical Medicine & Rehabilitation | Admitting: Physical Medicine & Rehabilitation

## 2017-05-23 DIAGNOSIS — Z951 Presence of aortocoronary bypass graft: Secondary | ICD-10-CM | POA: Diagnosis not present

## 2017-05-23 DIAGNOSIS — E46 Unspecified protein-calorie malnutrition: Secondary | ICD-10-CM | POA: Diagnosis present

## 2017-05-23 DIAGNOSIS — J449 Chronic obstructive pulmonary disease, unspecified: Secondary | ICD-10-CM | POA: Diagnosis present

## 2017-05-23 DIAGNOSIS — Z87891 Personal history of nicotine dependence: Secondary | ICD-10-CM | POA: Diagnosis not present

## 2017-05-23 DIAGNOSIS — I420 Dilated cardiomyopathy: Secondary | ICD-10-CM | POA: Diagnosis present

## 2017-05-23 DIAGNOSIS — R1314 Dysphagia, pharyngoesophageal phase: Secondary | ICD-10-CM | POA: Diagnosis not present

## 2017-05-23 DIAGNOSIS — I2510 Atherosclerotic heart disease of native coronary artery without angina pectoris: Secondary | ICD-10-CM | POA: Diagnosis present

## 2017-05-23 DIAGNOSIS — Z681 Body mass index (BMI) 19 or less, adult: Secondary | ICD-10-CM

## 2017-05-23 DIAGNOSIS — M1712 Unilateral primary osteoarthritis, left knee: Secondary | ICD-10-CM | POA: Diagnosis present

## 2017-05-23 DIAGNOSIS — I5022 Chronic systolic (congestive) heart failure: Secondary | ICD-10-CM | POA: Diagnosis present

## 2017-05-23 DIAGNOSIS — Z954 Presence of other heart-valve replacement: Secondary | ICD-10-CM

## 2017-05-23 DIAGNOSIS — J90 Pleural effusion, not elsewhere classified: Secondary | ICD-10-CM

## 2017-05-23 DIAGNOSIS — D62 Acute posthemorrhagic anemia: Secondary | ICD-10-CM | POA: Diagnosis present

## 2017-05-23 DIAGNOSIS — R0902 Hypoxemia: Secondary | ICD-10-CM | POA: Diagnosis present

## 2017-05-23 DIAGNOSIS — Z952 Presence of prosthetic heart valve: Secondary | ICD-10-CM | POA: Diagnosis not present

## 2017-05-23 DIAGNOSIS — M109 Gout, unspecified: Secondary | ICD-10-CM | POA: Diagnosis present

## 2017-05-23 DIAGNOSIS — D72829 Elevated white blood cell count, unspecified: Secondary | ICD-10-CM | POA: Diagnosis present

## 2017-05-23 DIAGNOSIS — E785 Hyperlipidemia, unspecified: Secondary | ICD-10-CM | POA: Diagnosis present

## 2017-05-23 DIAGNOSIS — Z8674 Personal history of sudden cardiac arrest: Secondary | ICD-10-CM | POA: Diagnosis not present

## 2017-05-23 DIAGNOSIS — Z8249 Family history of ischemic heart disease and other diseases of the circulatory system: Secondary | ICD-10-CM | POA: Diagnosis not present

## 2017-05-23 DIAGNOSIS — Z7901 Long term (current) use of anticoagulants: Secondary | ICD-10-CM | POA: Diagnosis not present

## 2017-05-23 DIAGNOSIS — R5381 Other malaise: Principal | ICD-10-CM | POA: Diagnosis present

## 2017-05-23 DIAGNOSIS — Z79899 Other long term (current) drug therapy: Secondary | ICD-10-CM

## 2017-05-23 DIAGNOSIS — R1310 Dysphagia, unspecified: Secondary | ICD-10-CM

## 2017-05-23 DIAGNOSIS — E8809 Other disorders of plasma-protein metabolism, not elsewhere classified: Secondary | ICD-10-CM | POA: Diagnosis present

## 2017-05-23 LAB — BASIC METABOLIC PANEL
Anion gap: 7 (ref 5–15)
BUN: 10 mg/dL (ref 6–20)
CO2: 30 mmol/L (ref 22–32)
Calcium: 8.2 mg/dL — ABNORMAL LOW (ref 8.9–10.3)
Chloride: 100 mmol/L — ABNORMAL LOW (ref 101–111)
Creatinine, Ser: 0.75 mg/dL (ref 0.61–1.24)
GFR calc Af Amer: >60 mL/min (ref >60)
GFR calc non Af Amer: >60 mL/min (ref >60)
Glucose, Bld: 137 mg/dL — ABNORMAL HIGH (ref 65–99)
Potassium: 4.1 mmol/L (ref 3.5–5.1)
Sodium: 137 mmol/L (ref 135–145)

## 2017-05-23 LAB — CBC
HCT: 32 % — ABNORMAL LOW (ref 39.0–52.0)
Hemoglobin: 10.1 g/dL — ABNORMAL LOW (ref 13.0–17.0)
MCH: 29.7 pg (ref 26.0–34.0)
MCHC: 31.6 g/dL (ref 30.0–36.0)
MCV: 94.1 fL (ref 78.0–100.0)
Platelets: 511 10*3/uL — ABNORMAL HIGH (ref 150–400)
RBC: 3.4 MIL/uL — ABNORMAL LOW (ref 4.22–5.81)
RDW: 15 % (ref 11.5–15.5)
WBC: 11 10*3/uL — ABNORMAL HIGH (ref 4.0–10.5)

## 2017-05-23 LAB — PROTIME-INR
INR: 2.51
Prothrombin Time: 27.5 seconds — ABNORMAL HIGH (ref 11.4–15.2)

## 2017-05-23 MED ORDER — ACETAMINOPHEN 325 MG PO TABS
325.0000 mg | ORAL_TABLET | ORAL | Status: DC | PRN
  Administered 2017-05-25 – 2017-05-26 (×2): 650 mg via ORAL
  Filled 2017-05-23 (×3): qty 2

## 2017-05-23 MED ORDER — PANTOPRAZOLE SODIUM 40 MG PO TBEC
40.0000 mg | DELAYED_RELEASE_TABLET | Freq: Every day | ORAL | Status: DC
  Administered 2017-05-24 – 2017-05-25 (×2): 40 mg via ORAL
  Filled 2017-05-23 (×3): qty 1

## 2017-05-23 MED ORDER — WARFARIN SODIUM 1 MG PO TABS: 1.0000 mg | ORAL_TABLET | Freq: Once | ORAL | Status: DC

## 2017-05-23 MED ORDER — ALUM & MAG HYDROXIDE-SIMETH 200-200-20 MG/5ML PO SUSP: 30.0000 mL | ORAL | Status: DC | PRN

## 2017-05-23 MED ORDER — DIPHENHYDRAMINE HCL 12.5 MG/5ML PO ELIX: 12.5000 mg | ORAL_SOLUTION | Freq: Four times a day (QID) | ORAL | Status: DC | PRN

## 2017-05-23 MED ORDER — SODIUM CHLORIDE 0.9% FLUSH
10.0000 mL | INTRAVENOUS | Status: DC | PRN
  Administered 2017-05-24 – 2017-05-25 (×4): 10 mL
  Administered 2017-05-26: 30 mL
  Administered 2017-05-28: 10 mL
  Filled 2017-05-23 (×6): qty 40

## 2017-05-23 MED ORDER — FLEET ENEMA 7-19 GM/118ML RE ENEM: 1.0000 | ENEMA | Freq: Once | RECTAL | Status: DC | PRN

## 2017-05-23 MED ORDER — AMIODARONE HCL 200 MG PO TABS: 200.0000 mg | ORAL_TABLET | Freq: Every day | ORAL | Status: DC

## 2017-05-23 MED ORDER — GUAIFENESIN 100 MG/5ML PO SOLN: 5.0000 mL | ORAL | 0 refills | Status: DC | PRN

## 2017-05-23 MED ORDER — PROCHLORPERAZINE MALEATE 5 MG PO TABS: 5.0000 mg | ORAL_TABLET | Freq: Four times a day (QID) | ORAL | Status: DC | PRN

## 2017-05-23 MED ORDER — WARFARIN SODIUM 5 MG PO TABS
2.5000 mg | ORAL_TABLET | Freq: Once | ORAL | Status: AC
  Administered 2017-05-23: 2.5 mg via ORAL
  Filled 2017-05-23: qty 1

## 2017-05-23 MED ORDER — STARCH (THICKENING) PO POWD: ORAL | 0 refills | Status: DC

## 2017-05-23 MED ORDER — CHLORHEXIDINE GLUCONATE CLOTH 2 % EX PADS: 6.0000 | MEDICATED_PAD | Freq: Every day | CUTANEOUS | Status: DC

## 2017-05-23 MED ORDER — GUAIFENESIN-DM 100-10 MG/5ML PO SYRP: 5.0000 mL | ORAL_SOLUTION | Freq: Four times a day (QID) | ORAL | Status: DC | PRN

## 2017-05-23 MED ORDER — PROCHLORPERAZINE 25 MG RE SUPP
12.5000 mg | Freq: Four times a day (QID) | RECTAL | Status: DC | PRN
  Filled 2017-05-23: qty 1

## 2017-05-23 MED ORDER — LEVALBUTEROL HCL 0.63 MG/3ML IN NEBU
0.6300 mg | INHALATION_SOLUTION | RESPIRATORY_TRACT | Status: DC | PRN
  Filled 2017-05-23: qty 3

## 2017-05-23 MED ORDER — POLYETHYLENE GLYCOL 3350 17 G PO PACK: 17.0000 g | PACK | Freq: Every day | ORAL | Status: DC | PRN

## 2017-05-23 MED ORDER — BISACODYL 10 MG RE SUPP: 10.0000 mg | Freq: Every day | RECTAL | Status: DC | PRN

## 2017-05-23 MED ORDER — DIPHENHYDRAMINE HCL 12.5 MG/5ML PO ELIX: 12.5000 mg | ORAL_SOLUTION | Freq: Every evening | ORAL | 0 refills | Status: DC | PRN

## 2017-05-23 MED ORDER — LOPERAMIDE HCL 1 MG/5ML PO LIQD
1.0000 mg | ORAL | 0 refills | Status: DC | PRN
Start: 2017-05-23 — End: 2017-05-31

## 2017-05-23 MED ORDER — LEVALBUTEROL HCL 0.63 MG/3ML IN NEBU
0.6300 mg | INHALATION_SOLUTION | Freq: Three times a day (TID) | RESPIRATORY_TRACT | Status: DC
  Administered 2017-05-23 – 2017-05-28 (×14): 0.63 mg via RESPIRATORY_TRACT
  Filled 2017-05-23 (×13): qty 3

## 2017-05-23 MED ORDER — STARCH (THICKENING) PO POWD: ORAL | Status: DC | PRN

## 2017-05-23 MED ORDER — RESOURCE THICKENUP CLEAR PO POWD
ORAL | Status: DC | PRN
  Filled 2017-05-23: qty 125

## 2017-05-23 MED ORDER — WARFARIN SODIUM 2 MG PO TABS: 1.0000 mg | ORAL_TABLET | Freq: Once | ORAL | Status: DC

## 2017-05-23 MED ORDER — WARFARIN - PHARMACIST DOSING INPATIENT
Freq: Every day | Status: DC
  Administered 2017-05-23 – 2017-05-25 (×3)

## 2017-05-23 MED ORDER — POTASSIUM CHLORIDE 20 MEQ/15ML (10%) PO SOLN
20.0000 meq | Freq: Every day | ORAL | Status: DC
  Administered 2017-05-24: 20 meq via ORAL
  Filled 2017-05-23 (×2): qty 15

## 2017-05-23 MED ORDER — MAGIC MOUTHWASH
5.0000 mL | Freq: Three times a day (TID) | ORAL | Status: DC
  Administered 2017-05-23 – 2017-05-31 (×21): 5 mL via ORAL
  Filled 2017-05-23 (×27): qty 5

## 2017-05-23 MED ORDER — SODIUM CHLORIDE 0.9% FLUSH
10.0000 mL | Freq: Two times a day (BID) | INTRAVENOUS | Status: DC
  Administered 2017-05-23 – 2017-05-30 (×4): 10 mL

## 2017-05-23 MED ORDER — TRAZODONE HCL 50 MG PO TABS
25.0000 mg | ORAL_TABLET | Freq: Every evening | ORAL | Status: DC | PRN
  Administered 2017-05-24 – 2017-05-25 (×3): 50 mg via ORAL
  Filled 2017-05-23 (×3): qty 1

## 2017-05-23 MED ORDER — PROCHLORPERAZINE EDISYLATE 5 MG/ML IJ SOLN: 5.0000 mg | Freq: Four times a day (QID) | INTRAMUSCULAR | Status: DC | PRN

## 2017-05-23 MED ORDER — LEVALBUTEROL HCL 0.63 MG/3ML IN NEBU: 0.6300 mg | INHALATION_SOLUTION | Freq: Three times a day (TID) | RESPIRATORY_TRACT | 12 refills | Status: DC

## 2017-05-23 MED ORDER — AMIODARONE HCL 200 MG PO TABS
200.0000 mg | ORAL_TABLET | Freq: Every day | ORAL | Status: DC
  Administered 2017-05-24 – 2017-05-31 (×8): 200 mg via ORAL
  Filled 2017-05-23 (×8): qty 1

## 2017-05-23 NOTE — Progress Notes (Signed)
ANTICOAGULATION CONSULT NOTE - Initial Consult  Pharmacy Consult for Warfarin per MD Indication: mechanical aortic valve  Allergies  Allergen Reactions  . No Known Allergies     Patient Measurements:    Vital Signs: Temp: 97.5 F (36.4 C) (07/12 1323) Temp Source: Oral (07/12 1323) BP: 101/60 (07/12 1200) Pulse Rate: 88 (07/12 1200)  Labs:  Recent Labs  05/21/17 0341 05/21/17 1611 05/22/17 0456 05/22/17 1617 05/23/17 0353  HGB 9.1* 10.2*  --  10.9* 10.1*  HCT 30.2* 30.0*  --  32.0* 32.0*  PLT 606*  --   --   --  511*  LABPROT 33.7*  --  31.5*  --  27.5*  INR 3.23  --  2.96  --  2.51  CREATININE 0.69 0.60*  --  0.60* 0.75    Estimated Creatinine Clearance: 77.1 mL/min (by C-G formula based on SCr of 0.75 mg/dL).   Medical History: Past Medical History:  Diagnosis Date  . AVD (aortic valve disease)   . Chronic anticoagulation   . Dilated cardiomyopathy (HCC)    EF 30-35%  . Dysrhythmia   . Hyperlipidemia   . LV dysfunction     Assessment: 71 yo male s/p ascending aorta replacement with history of mech aortic valve (goal per [facility] clinic 2.5-3.5) Pt  transferred  to rehab 7/12 and pharmacy consulted to dose coumadin - INR 4.07 (7/7) >> 3.23 >> 2.96 (warfarin resumed, 1 mg x1 on 7/11) >> 2.51   home dose: 5mg  daily except 2.5mg  on Mondays and Thursdays)   Goal of Therapy:  INR 2.5-3.5 Monitor platelets by anticoagulation protocol: Yes   Plan:  Warfarin 2.5 mg Daily INR  Harland German, Pharm D 05/23/2017 3:48 PM

## 2017-05-23 NOTE — Discharge Instructions (Signed)
Ascending aortic Replacement, Care After Refer to this sheet in the next few weeks. These instructions provide you with information about caring for yourself after your procedure. Your health care provider may also give you more specific instructions. Your treatment has been planned according to current medical practices, but problems sometimes occur. Call your health care provider if you have any problems or questions after your procedure. What can I expect after the procedure? After the procedure, it is common to have:  Pain around your incision area.  A small amount of blood or clear fluid coming from your incision.  Follow these instructions at home: Eating and drinking   Follow instructions from your health care provider about eating or drinking restrictions. ? Limit alcohol intake to no more than 1 drink per day for nonpregnant women and 2 drinks per day for men. One drink equals 12 oz of beer, 5 oz of wine, or 1 oz of hard liquor. ? Limit how much caffeine you drink. Caffeine can affect your heart's rate and rhythm.  Drink enough fluid to keep your urine clear or pale yellow.  Eat a heart-healthy diet. This should include plenty of fresh fruits and vegetables. If you eat meat, it should be lean cuts. Avoid foods that are: ? High in salt, saturated fat, or sugar. ? Canned or highly processed. ? Fried. Activity  Return to your normal activities as told by your health care provider. Ask your health care provider what activities are safe for you.  Exercise regularly once you have recovered, as told by your health care provider.  Avoid sitting for more than 2 hours at a time without moving. Get up and move around at least once every 1-2 hours. This helps to prevent blood clots in the legs.  Do not lift anything that is heavier than 10 lb (4.5 kg) until your health care provider approves.  Avoid pushing or pulling things with your arms until your health care provider approves. This  includes pulling on handrails to help you climb stairs. Incision care   Follow instructions from your health care provider about how to take care of your incision. Make sure you: ? Wash your hands with soap and water before you change your bandage (dressing). If soap and water are not available, use hand sanitizer. ? Change your dressing as told by your health care provider. ? Leave stitches (sutures), skin glue, or adhesive strips in place. These skin closures may need to stay in place for 2 weeks or longer. If adhesive strip edges start to loosen and curl up, you may trim the loose edges. Do not remove adhesive strips completely unless your health care provider tells you to do that.  Check your incision area every day for signs of infection. Check for: ? More redness, swelling, or pain. ? More fluid or blood. ? Warmth. ? Pus or a bad smell. Medicines  Take over-the-counter and prescription medicines only as told by your health care provider.  If you were prescribed an antibiotic medicine, take it as told by your health care provider. Do not stop taking the antibiotic even if you start to feel better. Travel  Avoid airplane travel for as long as told by your health care provider.  When you travel, bring a list of your medicines and a record of your medical history with you. Carry your medicines with you. Driving  Ask your health care provider when it is safe for you to drive. Do not drive until your health  care provider approves. °· Do not drive or operate heavy machinery while taking prescription pain medicine. °Lifestyle ° °· Do not use any tobacco products, such as cigarettes, chewing tobacco, or e-cigarettes. If you need help quitting, ask your health care provider. °· Resume sexual activity as told by your health care provider. Do not use medicines for erectile dysfunction unless your health care provider approves, if this applies. °· Work with your health care provider to keep your  blood pressure and cholesterol under control, and to manage any other heart conditions that you have. °· Maintain a healthy weight. °General instructions °· Do not take baths, swim, or use a hot tub until your health care provider approves. °· Do not strain to have a bowel movement. °· Avoid crossing your legs while sitting down. °· Check your temperature every day for a fever. A fever may be a sign of infection. °· If you are a woman and you plan to become pregnant, talk with your health care provider before you become pregnant. °· Wear compression stockings if your health care provider instructs you to do this. These stockings help to prevent blood clots and reduce swelling in your legs. °· Tell all health care providers who care for you that you have an artificial (prosthetic) aortic valve. If you have or have had heart disease or endocarditis, tell all health care providers about these conditions as well. °· Keep all follow-up visits as told by your health care provider. This is important. °Contact a health care provider if: °· You develop a skin rash. °· You experience sudden, unexplained changes in your weight. °· You have more redness, swelling, or pain around your incision. °· You have more fluid or blood coming from your incision. °· Your incision feels warm to the touch. °· You have pus or a bad smell coming from your incision. °· You have a fever. °Get help right away if: °· You develop chest pain that is different from the pain coming from your incision. °· You develop shortness of breath or difficulty breathing. °· You start to feel light-headed. °These symptoms may represent a serious problem that is an emergency. Do not wait to see if the symptoms will go away. Get medical help right away. Call your local emergency services (911 in the [place] Do not drive yourself to the hospital. °This information is not intended to replace advice given to you by your health care provider. Make sure you discuss any  questions you have with your health care provider. °Document Released: 05/17/2005 Document Revised: 04/05/2016 Document Reviewed: 10/02/2015 °Elsevier Interactive Patient Education © 2017 [employer] ° °

## 2017-05-23 NOTE — H&P (Signed)
Physical Medicine and Rehabilitation Admission H&P     CC: Debility post CABG with PEA arrest/medical issues.      HPI:  Jack Joseph is a 71 y.o. male with history of CAD s/pCABG, chronic systolic CHF s/p AVR '83- chronic coumadin, COPD who was admitted on 04/29/17 for redo sternotomy and repair of ascending aortic dilation/Wheat procedure.  Post op course complicated by VT with PEA arrest requiring CPR/ACLS protocol and intubation, R-PTX with large amount of subcutaneous emphysema right lateral chest, hypotension requiring pressors, severe left atelectasis with mucous plugging requiring reintubation and hypoxemia. Dr. Lovena Le consulted for input and anticipated ICD insertion prior to discharge to home.  He tolerated extubation on 7/1 and was started on dysphagia 3, nectar liquids on 7/3 due to trace aspiration of thins. MBS repeated on 7/10 showing no improvement and aspiration of thin liquids.   He continues to have issues with diarrhea requiring rectal tube but stools negative for C diff. Lifevest ordered due to and LifeVest ordered as repeat echo with severe global reduction in LVF with diffuse hypokineses and EF 20-25%. Patient noted to be deconditioned with cognitive deficits affecting mobility and ability to carry out ADL tasks. Pacer wires removed today and patient cleared medically to start CIR program.      Review of Systems  Constitutional: Positive for malaise/fatigue. Negative for chills and fever.  HENT: Negative for hearing loss and tinnitus.   Eyes: Negative for blurred vision and double vision.  Respiratory: Negative for cough and shortness of breath.   Cardiovascular: Negative for chest pain and palpitations.  Gastrointestinal: Negative for diarrhea and heartburn.  Genitourinary: Negative for dysuria, frequency and urgency.  Musculoskeletal: Negative for back pain and myalgias.  Skin: Negative for itching and rash.  Neurological: Positive for weakness. Negative for  speech change, focal weakness and headaches.            Past Medical History:  Diagnosis Date  . AVD (aortic valve disease)    . Chronic anticoagulation    . Dilated cardiomyopathy (HCC)      EF 30-35%  . Dysrhythmia    . Hyperlipidemia    . LV dysfunction             Past Surgical History:  Procedure Laterality Date  . AORTIC VALVE REPLACEMENT      . BENTALL PROCEDURE N/A 04/29/2017    Procedure: REPLACEMENT ASCENDING AORTA/ WHEAT PROCEDURE, HYPOTHERMIC CIRCULATORY ARREST AND CARDIOPULMONARY BYPASS, AND USE OF HEMASHIELD PLATINUM WOVEN DOUBLE VELOUR VASCULAR GRAFT 32 MM X 50 CM;  Surgeon: [name], MD;  Location: [facility] [facility];  Service: Open Heart Surgery;  Laterality: N/A;  . DOPPLER ECHOCARDIOGRAPHY   04/07/2002    EF 30-35%  . RIGHT/LEFT HEART CATH AND CORONARY ANGIOGRAPHY N/A 03/05/2017    Procedure: Right/Left Heart Cath and Coronary Angiography;  Surgeon: Peter M Martinique, MD;  Location: [facility];  Service: Cardiovascular;  Laterality: N/A;  . TEE WITHOUT CARDIOVERSION N/A 04/29/2017    Procedure: TRANSESOPHAGEAL ECHOCARDIOGRAM (TEE);  Surgeon: [name], MD;  Location: [place];  Service: Open Heart Surgery;  Laterality: N/A;           Family History  Problem Relation Age of Onset  . Heart disease Brother        Social History: Married. Independent and was working  6 hours/5 days a week for [employer] store [employer]. He reports that he quit smoking about 16 years ago. He has never used  smokeless tobacco. He reports that he does not drink alcohol or use drugs.          Allergies  Allergen Reactions  . No Known Allergies              Medications Prior to Admission  Medication Sig Dispense Refill  . carvedilol (COREG) 3.125 MG tablet Take 2 tablets (6.25 mg total) by mouth 2 (two) times daily. 180 tablet 3  . colchicine 0.6 MG tablet Take 0.6 mg by mouth daily as needed.      . enoxaparin (LOVENOX) 60 MG/0.6ML injection Inject 0.6 mLs (60 mg total) into  the skin every 12 (twelve) hours. 20 Syringe 0  . losartan (COZAAR) 25 MG tablet Take 1 tablet (25 mg total) by mouth daily. 30 tablet 11  . methylcellulose (CITRUCEL) oral powder Take 1 packet by mouth 2 (two) times daily.       . sildenafil (REVATIO) 20 MG tablet TAKE 1-5 TABLETS BY MOUTH AS NEEDED FOR SEXUAL ACTIVITY (Patient not taking: Reported on 04/25/2017) 50 tablet 1  . warfarin (COUMADIN) 5 MG tablet Take 1 tablet by mouth daily or as directed by [facility] (Patient not taking: Reported on 04/25/2017) 90 tablet 1      Home: [place] expects to be discharged to:: Inpatient rehab Living Arrangements: Spouse/significant other Available Help at Discharge: Family, Available 24 hours/day Type of Home: House Home Access: Stairs to enter [employer] of Steps: 3 Entrance Stairs-Rails: None Home Layout: One level Bathroom Shower/Tub: Chiropodist: Standard Bathroom Accessibility: Yes Home Equipment: Shower seat, [place] - single point, Grab bars - tub/shower Additional Comments: worked 6 hours day, 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   Functional History: Prior Function Level of Independence: Independent   Functional Status:  Mobility: Bed Mobility Overal bed mobility: Needs Assistance Bed Mobility: Supine to Sit, Sit to Supine Rolling: Min guard Sidelying to sit: Min guard Sit to sidelying: Min assist General bed mobility comments: pt sitting in chair upon arrival; assist to bring bilat LE into bed Transfers Overall transfer level: Needs assistance Equipment used: Rolling walker (2 wheeled) Transfers: Sit to/from Stand, Stand Pivot Transfers Sit to Stand: Min assist Stand pivot transfers: Min assist General transfer comment: assist for balance  Ambulation/Gait Ambulation/Gait assistance: +2 safety/equipment, Min assist Ambulation Distance (Feet): 15 Feet Assistive device: Rolling walker (2  wheeled) Gait Pattern/deviations: Decreased stride length, Trunk flexed, Wide base of support, Step-through pattern General Gait Details: cues for posture and decreased weightbearing on RW; limited by loose stool Gait velocity interpretation: Below normal speed for age/gender   ADL: ADL Overall ADL's : Needs assistance/impaired Eating/Feeding: Minimal assistance, Sitting Eating/Feeding Details (indicate cue type and reason): due to lines  Grooming: Wash/dry hands, Wash/dry face, Oral care, Supervision/safety, Sitting Upper Body Bathing: Moderate assistance, Sitting Lower Body Bathing: Moderate assistance, Sit to/from stand Lower Body Bathing Details (indicate cue type and reason): Pt able to don/doff socks with supervision  Upper Body Dressing : Moderate assistance, Sitting Lower Body Dressing: Moderate assistance, Sit to/from stand Toilet Transfer: Minimal assistance, BSC, RW, Stand-pivot Toileting- Clothing Manipulation and Hygiene: Moderate assistance, Sit to/from stand Functional mobility during ADLs: Minimal assistance, Rolling walker General ADL Comments: Pt requires assist due to generalized weakness    Cognition: Cognition Overall Cognitive Status: Within Functional Limits for tasks assessed Orientation Level: Oriented X4 Cognition Arousal/Alertness: Awake/alert Behavior During Therapy: WFL for tasks assessed/performed Overall Cognitive Status: Within Functional Limits for tasks assessed  Area of Impairment: Attention, Memory, Problem solving, Awareness Current Attention Level: Selective Memory: Decreased recall of precautions, Decreased short-term memory Awareness: Intellectual Problem Solving: Slow processing, Difficulty sequencing, Requires verbal cues      Blood pressure 117/70, pulse 87, temperature 98.1 F (36.7 C), temperature source Axillary, resp. rate (!) 21, height 5\' 10"  (1.778 m), weight 65.3 kg (143 lb 15.4 oz), SpO2 98 %. Physical Exam  Nursing note and  vitals reviewed. Constitutional: He is oriented to person, place, and time. He appears well-developed and well-nourished.  Thin, frail, fatigued appearing  Male.   HENT:  Head: Normocephalic and atraumatic.  Mouth/Throat: Oropharynx is clear and moist.  Eyes: Pupils are equal, round, and reactive to light. Conjunctivae are normal.  Neck: Normal range of motion. Neck supple.  Cardiovascular: Normal rate and regular rhythm.   Murmur heard. Sternal incision clean, dry and intact.  Respiratory: Effort normal and breath sounds normal. No stridor. No respiratory distress. He has no wheezes. He exhibits no tenderness. Difficult to evaluate all lung fields due to life vest GI: Soft. Bowel sounds are normal. He exhibits no distension. There is no tenderness.  Musculoskeletal: He exhibits no edema or tenderness.  Neurological: He is alert and oriented to person, place, and time.  Dysphonia noted. Able to follow basic commands without difficulty. Moves all four equally.   Skin: Skin is warm and dry. No rash noted. No erythema.    mild dyspnea with exertion such as manual muscle testing Motor strength is 4/5 in bilateral deltoid, biceps 4 right triceps, 4 minus. Left triceps 4 bilateral, finger flexors and extensors, 4 at the hip flexors, knee extensors, 5/5 ankle dorsi flexors.   sensation is reported as equal to light touch bilateral upper and lower limbs. Lab Results Last 48 Hours        Results for orders placed or performed during the hospital encounter of 04/29/17 (from the past 48 hour(s))  I-STAT, chem 8     Status: Abnormal    Collection Time: 05/21/17  4:11 PM  Result Value Ref Range    Sodium 138 135 - 145 mmol/L    Potassium 4.1 3.5 - 5.1 mmol/L    Chloride 99 (L) 101 - 111 mmol/L    BUN 12 6 - 20 mg/dL    Creatinine, Ser 07/22/17 (L) 0.61 - 1.24 mg/dL    Glucose, Bld 6.55 (H) 65 - 99 mg/dL    Calcium, Ion 870 9.29 - 1.40 mmol/L    TCO2 29 0 - 100 mmol/L    Hemoglobin 10.2 (L) 13.0 -  17.0 g/dL    HCT 4.16 (L) 50.8 - 52.0 %  Protime-INR     Status: Abnormal    Collection Time: 05/22/17  4:56 AM  Result Value Ref Range    Prothrombin Time 31.5 (H) 11.4 - 15.2 seconds    INR 2.96    I-STAT, chem 8     Status: Abnormal    Collection Time: 05/22/17  4:17 PM  Result Value Ref Range    Sodium 138 135 - 145 mmol/L    Potassium 4.2 3.5 - 5.1 mmol/L    Chloride 96 (L) 101 - 111 mmol/L    BUN 10 6 - 20 mg/dL    Creatinine, Ser 07/23/17 (L) 0.61 - 1.24 mg/dL    Glucose, Bld 5.31 (H) 65 - 99 mg/dL    Calcium, Ion 139 4.91 - 1.40 mmol/L    TCO2 29 0 - 100 mmol/L    Hemoglobin  10.9 (L) 13.0 - 17.0 g/dL    HCT 32.0 (L) 39.0 - 52.0 %  Protime-INR     Status: Abnormal    Collection Time: 05/23/17  3:53 AM  Result Value Ref Range    Prothrombin Time 27.5 (H) 11.4 - 15.2 seconds    INR 2.51    CBC     Status: Abnormal    Collection Time: 05/23/17  3:53 AM  Result Value Ref Range    WBC 11.0 (H) 4.0 - 10.5 K/uL    RBC 3.40 (L) 4.22 - 5.81 MIL/uL    Hemoglobin 10.1 (L) 13.0 - 17.0 g/dL    HCT 32.0 (L) 39.0 - 52.0 %    MCV 94.1 78.0 - 100.0 fL    MCH 29.7 26.0 - 34.0 pg    MCHC 31.6 30.0 - 36.0 g/dL    RDW 15.0 11.5 - 15.5 %    Platelets 511 (H) 150 - 400 K/uL  Basic metabolic panel     Status: Abnormal    Collection Time: 05/23/17  3:53 AM  Result Value Ref Range    Sodium 137 135 - 145 mmol/L    Potassium 4.1 3.5 - 5.1 mmol/L    Chloride 100 (L) 101 - 111 mmol/L    CO2 30 22 - 32 mmol/L    Glucose, Bld 137 (H) 65 - 99 mg/dL    BUN 10 6 - 20 mg/dL    Creatinine, Ser 0.75 0.61 - 1.24 mg/dL    Calcium 8.2 (L) 8.9 - 10.3 mg/dL    GFR calc non Af Amer >60 >60 mL/min    GFR calc Af Amer >60 >60 mL/min      Comment: (NOTE) The eGFR has been calculated using the CKD EPI equation. This calculation has not been validated in all clinical situations. eGFR's persistently <60 mL/min signify possible Chronic Kidney Disease.      Anion gap 7 5 - 15       Imaging Results (Last  48 hours)  Dg Chest 2 View   Result Date: 05/23/2017 CLINICAL DATA:  Postop aortic valve replacement. EXAM: CHEST  2 VIEW COMPARISON:  05/20/2017 and previous FINDINGS: More pleural fluid on the left, moderate in size, layering dependently, with left lower lobe atelectasis. Smaller effusion on the right with mild right base volume loss. 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 Aortic valve replacement appears the same. Right arm PICC tip in the SVC just above the right atrium. IMPRESSION: Moderate size left effusion with left lower lobe collapse. Smaller right effusion with mild right base volume loss. Electronically Signed   By: Nelson Chimes M.D.   On: 05/23/2017 07:40             Medical Problem List and Plan: 1.  Deconditioning secondary to aortic root repair with postoperative cardiac arrest 2. Replacement of Ascending aorta/Wheat procedure/DVT Prophylaxis/Anticoagulation: Pharmaceutical: Coumadin 3. Pain Management: tylenol prn 4. Mood: LCSW to follow for evaluation and support.  5. Neuropsych: This patient is not fully capable of making decisions on his own behalf. 6. Skin/Wound Care: routine pressure relief measures.  7. Fluids/Electrolytes/Nutrition:  Strict I/O. Check daily weights.  Check lytes in am.  8.Chronic systolic CHF with Hypotension: SBP 90- 101 during exam. No BB due to advanced HF/low EF and No ACE/ARB due to labile BP. 9.  Abnormal LFTs: will continue to monitor.No statins.  10. Leucocytosis: Monitor for signs of infections. Diarrhea resolved. 11. ABLA: Will  recheck in am.  12. VT arrest s/p DCCV X 3 t: On amiodarone 200 mg daily. Continue to monitor LFTs. Left Vest in place.  13. Dysphagia: continue dysphagia 3, nectar liquids with aspiration precautions.  14. Hypoxia: Continues to be limited by DOE/Hypoxia. Continue nebs tid. Encourage IS while awake--wean as tolerated.      Post Admission Physician  Evaluation: 1. Functional deficits secondary  to deconditioning. 2. Patient is admitted to receive collaborative, interdisciplinary care between the physiatrist, rehab nursing staff, and therapy team. 3. Patient's level of medical complexity and substantial therapy needs in context of that medical necessity cannot be provided at a lesser intensity of care such as a SNF. 4. Patient has experienced substantial functional loss from his/her baseline which was documented above under the "Functional History" and "Functional Status" headings.  Judging by the patient's diagnosis, physical exam, and functional history, the patient has potential for functional progress which will result in measurable gains while on inpatient rehab.  These gains will be of substantial and practical use upon discharge  in facilitating mobility and self-care at the household level. 5. Physiatrist will provide 24 hour management of medical needs as well as oversight of the therapy plan/treatment and provide guidance as appropriate regarding the interaction of the two. 6. The Preadmission Screening has been reviewed and patient status is unchanged unless otherwise stated above. 7. 24 hour rehab nursing will assist with bladder management, bowel management, safety, skin/wound care, disease management, medication administration, pain management and patient education  and help integrate therapy concepts, techniques,education, etc. 8. PT will assess and treat for/with: pre gait, gait training, endurance , safety, equipment, neuromuscular re education.   Goals are: Mod I. 9. OT will assess and treat for/with: ADLs, Cognitive perceptual skills, Neuromuscular re education, safety, endurance, equipment.   Goals are: Mod I. Therapy may proceed with showering this patient. 10. SLP will assess and treat for/with: Evaluate higher level cognition, medication management.  Goals are: Modified independent. 11. Case Management and Social Worker will  assess and treat for psychological issues and discharge planning. 12. Team conference will be held weekly to assess progress toward goals and to determine barriers to discharge. 13. Patient will receive at least 3 hours of therapy per day at least 5 days per week. 14. ELOS: 10-14 days       15. Prognosis:  good         Charlett Blake M.D. [facility] FAAPM&R (Sports Med, Neuromuscular Med) Diplomate Am Board of Electrodiagnostic Med   Flora Lipps 05/23/2017

## 2017-05-23 NOTE — Progress Notes (Signed)
Patient ID: Jack Joseph, male   DOB: 1946/06/01, 71 y.o.   MRN: [id number] TCTS DAILY ICU PROGRESS NOTE                   [address]            [place] [address]          [phone]   24 Days Post-Op Procedure(s) (LRB): REDO STERNOTOMY (N/A) REPLACEMENT ASCENDING AORTA/ WHEAT PROCEDURE, HYPOTHERMIC CIRCULATORY ARREST AND CARDIOPULMONARY BYPASS, AND USE OF HEMASHIELD PLATINUM WOVEN DOUBLE VELOUR VASCULAR GRAFT 32 MM X 50 CM (N/A) TRANSESOPHAGEAL ECHOCARDIOGRAM (TEE) (N/A)  Total Length of Stay:  LOS: 24 days   Subjective: Alert this am   Objective: Vital signs in last 24 hours: Temp:  [97.5 F (36.4 C)-98.8 F (37.1 C)] 98.1 F (36.7 C) (07/12 0815) Pulse Rate:  [81-101] 82 (07/12 0800) Cardiac Rhythm: Normal sinus rhythm (07/12 0800) Resp:  [11-33] 13 (07/12 0800) BP: (91-125)/(53-78) 103/62 (07/12 0800) SpO2:  [93 %-100 %] 100 % (07/12 0800) Weight:  [143 lb 15.4 oz (65.3 kg)] 143 lb 15.4 oz (65.3 kg) (07/12 0400)  Filed Weights   05/21/17 0400 05/22/17 0500 05/23/17 0400  Weight: 143 lb (64.9 kg) 144 lb 4.8 oz (65.5 kg) 143 lb 15.4 oz (65.3 kg)    Weight change: -5.4 oz (-0.154 kg)   Hemodynamic parameters for last 24 hours:    Intake/Output from previous day: 07/11 0701 - 07/12 0700 In: 30 [P.O.:30] Out: 650 [Urine:650]  Intake/Output this shift: No intake/output data recorded.  Current Meds: Scheduled Meds: . amiodarone  200 mg Oral Daily  . Chlorhexidine Gluconate Cloth  6 each Topical Daily  . levalbuterol  0.63 mg Nebulization TID  . magic mouthwash  5 mL Oral TID  . mouth rinse  15 mL Mouth Rinse BID  . moving right along book   Does not apply Once  . pantoprazole  40 mg Oral Daily  . potassium chloride  20 mEq Oral Daily  . sodium chloride flush  3 mL Intravenous Q12H  . Warfarin - Physician Dosing Inpatient   Does not apply q1800   Continuous Infusions: . sodium chloride 10 mL/hr at 05/21/17 0700   PRN  Meds:.bisacodyl **OR** bisacodyl, diphenhydrAMINE, food thickener, guaiFENesin, levalbuterol, loperamide, ondansetron **OR** ondansetron (ZOFRAN) IV, oxyCODONE  General appearance: alert and cooperative Neurologic: intact Heart: regular rate and rhythm, S1, S2 normal, no murmur, click, rub or gallop Lungs: diminished breath sounds LLL Abdomen: soft, non-tender; bowel sounds normal; no masses,  no organomegaly Extremities: extremities normal, atraumatic, no cyanosis or edema and Homans sign is negative, no sign of DVT Wound: sternum stable   Lab Results: CBC: Recent Labs  05/21/17 0341  05/22/17 1617 05/23/17 0353  WBC 8.7  --   --  11.0*  HGB 9.1*  < > 10.9* 10.1*  HCT 30.2*  < > 32.0* 32.0*  PLT 606*  --   --  511*  < > = values in this interval not displayed. BMET:  Recent Labs  05/21/17 0341  05/22/17 1617 05/23/17 0353  NA 137  < > 138 137  K 3.8  < > 4.2 4.1  CL 102  < > 96* 100*  CO2 29  --   --  30  GLUCOSE 98  < > 105* 137*  BUN 13  < > 10 10  CREATININE 0.69  < > 0.60* 0.75  CALCIUM 8.3*  --   --  8.2*  < > =  values in this interval not displayed.  CMET: Lab Results  Component Value Date   WBC 11.0 (H) 05/23/2017   HGB 10.1 (L) 05/23/2017   HCT 32.0 (L) 05/23/2017   PLT 511 (H) 05/23/2017   GLUCOSE 137 (H) 05/23/2017   CHOL 199 01/22/2017   TRIG 71 01/22/2017   HDL 64 01/22/2017   LDLCALC 121 (H) 01/22/2017   ALT 41 05/18/2017   AST 24 05/18/2017   NA 137 05/23/2017   K 4.1 05/23/2017   CL 100 (L) 05/23/2017   CREATININE 0.75 05/23/2017   BUN 10 05/23/2017   CO2 30 05/23/2017   TSH 4.065 05/09/2017   INR 2.51 05/23/2017   HGBA1C 5.7 (H) 04/25/2017      PT/INR:  Recent Labs  05/23/17 0353  LABPROT 27.5*  INR 2.51   Radiology: Dg Chest 2 View  Result Date: 05/23/2017 CLINICAL DATA:  Postop aortic valve replacement. EXAM: CHEST  2 VIEW COMPARISON:  05/20/2017 and previous FINDINGS: More pleural fluid on the left, moderate in size,  layering dependently, with left lower lobe atelectasis. Smaller effusion on the right with mild right base volume loss. 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 Aortic valve replacement appears the same. Right arm PICC tip in the SVC just above the right atrium. IMPRESSION: Moderate size left effusion with left lower lobe collapse. Smaller right effusion with mild right base volume loss. Electronically Signed   By: Paulina Fusi M.D.   On: 05/23/2017 07:40     Assessment/Plan: S/P Procedure(s) (LRB): REDO STERNOTOMY (N/A) REPLACEMENT ASCENDING AORTA/ WHEAT PROCEDURE, HYPOTHERMIC CIRCULATORY ARREST AND CARDIOPULMONARY BYPASS, AND USE OF HEMASHIELD PLATINUM WOVEN DOUBLE VELOUR VASCULAR GRAFT 32 MM X 50 CM (N/A) TRANSESOPHAGEAL ECHOCARDIOGRAM (TEE) (N/A) Mobilize Diuresis To rehab with life vest today  monitor effusion may need thoracentesis  D/d pacing wires   [name] 05/23/2017 8:53 AM

## 2017-05-23 NOTE — Discharge Summary (Signed)
Physician Discharge Summary  Patient ID: Jack Joseph MRN: [id number] DOB/AGE: 1946/01/02 71 y.o.  Admit date: 04/29/2017 Discharge date: 05/23/2017  Admission Diagnoses:  Patient Active Problem List   Diagnosis Date Noted  . Atelectasis   . Hyperglycemia   . Delirium   . Complication of chest tube   . Chest tube in place   . Acute encephalopathy   . Essential hypertension   . Dilatation of aorta (HCC)   . Postop check   . Acute respiratory failure with hypoxia (HCC)   . VT (ventricular tachycardia) (HCC)   . Thoracic aortic aneurysm without rupture (HCC) 02/25/2017  . Mass of left lung 02/25/2017  . Acute on chronic systolic (congestive) heart failure (HCC) 02/25/2017  . CHF (congestive heart failure) (HCC) 07/10/2016  . Varicose veins of both lower extremities 07/10/2016  . Erectile dysfunction 07/10/2016  . AVD (aortic valve disease)   . Dilated cardiomyopathy (HCC)   . Hyperlipidemia   . Chronic anticoagulation   . Hx of replacement of aortic valve 03/15/2011   Discharge Diagnoses:   Patient Active Problem List   Diagnosis Date Noted  . Atelectasis   . Hyperglycemia   . Delirium   . Complication of chest tube   . Chest tube in place   . Acute encephalopathy   . Essential hypertension   . Dilatation of aorta (HCC)   . Postop check   . Acute respiratory failure with hypoxia (HCC)   . VT (ventricular tachycardia) (HCC)   . 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 Thoracic aortic aneurysm without rupture (HCC) 02/25/2017  . Mass of left lung 02/25/2017  . Acute on chronic systolic (congestive) heart failure (HCC) 02/25/2017  . CHF (congestive heart failure) (HCC) 07/10/2016  . Varicose veins of both lower extremities 07/10/2016  . Erectile dysfunction 07/10/2016  . AVD (aortic valve disease)   . Dilated cardiomyopathy (HCC)   . Hyperlipidemia   . Chronic anticoagulation   . Hx of replacement of aortic valve 03/15/2011   Discharged Condition:  good  History of Present Illness:  [name] is a 71 yo male who is S/P Aortic Valve replacement performed in 1983 with a 27 mm Bjork-Shiley tilting disk valve 27 [id number].  In 2003 he was noted to have LV dysfunction in 30-35% and 39 mm aorta and 58 mm at the mid ascending aorta.  He has been followed by Dr. Swaziland since that time and his most recent echocardiogram noted worsening of his LV function and persistent enlargement of his ascending aorta.  Due to this he was referred to Dr. Tyrone Sage for surgical evaluation.  The patient admitted to have increasing pedal edema and exertional shortness of breath.  His native valve as still in working order and was not part of the recall for the [facility] valve.  Due to this the patient chose to keep his current aortic valve if possible and would have his ascending aorta replaced.  The risks and benefits of the procedure were explained to the patient and he was agreeable to proceed.   Hospital Course:   [name] presented to [facility] on 04/29/2017.  He was taken to the operating room and underwent a Wheat procedure with replacement of his ascending aorta.  The patient tolerated the procedure without difficulty and was taken to the SICU in stable condition.  The patient was extubated the evening of surgery.  During his stay in the SICU the patient was weaned off Levophed, Dopamine and  Milrinone as tolerated.  Late evening POD #1 patient suffered a pulseless VTACH arrest.  He underwent CPR, cardioversion, and was re-intubated.  He was successfully resuscitated to NSR with multifocal ectopy.  He was started on a Amiodarone and was neurologically intact.  He developed a right sided pneumothorax with subsequent chest tube placement.  EP consult was obtained and anticipated patient requiring ICD prior to discharge due to long standing history of Cardiomyopathy.  The patient developed brief runs of V tach which EP treated with IV amiodarone boluses.   Critical care followed patient and weaned off ventilator and he was extubated on POD #3.  Unfortunately after extubation the patient developed sub cutaneous emphysema on the right side and required a second chest tube to be placed.  The patient's respiratory status worsened due to mucous plugging and he again required re-intubation on POD #3.  The patient was weaned and extubated on POD #6.  He remained on drips which were weaned as tolerated.  He was restarted on Coumadin for his mechanical aortic valve.  The patient developed anemia and required transfusion of 2 units of packed cells.  The patient underwent SLP evaluation post extubation and showed severe risk of aspiration.  He was started on TPN due to this.  The patient subcutaneous air worsened.  His first right sided chest tube was not functioning and was removed.  Due to worsening appearance of his chest xray he required re-insertion of a new right sided chest tube.  He developed worsening atelectasis and underwent Bronchoscopy on POD # 8.  The patients respiratory again declined.  He required re-intubation on POD #8.  He developed a fever of 102 and was started on empiric antibiotics.  He did not have any leukocytosis.  He was transitioned to IV heparin for his mechanical valve and his coumadin was temporarily discontinued.  He required Levophed for BP support.  He again developed drop in his hemoglobin and required transfusion.  His respiratory status improved and he was again extubated on POD #13.  Repeat SLP evaluation was again performed and recommended patient to remain NPO.  He was restarted on Coumadin for his mechanical AVR.  Modified Barium Swallow was performed and showed patient to have good strong swallowing ability.  He was started on a dysphagia 3 diet.  His chest tube exhibited no air leak and his pulmonary status improved.  His chest tube was removed on POD #16.  He developed foul smelling soft stools.  C. Difficile toxin was checked and was  positive.  He required rectal tube placement and anti-diarrheal treatment.  This ultimately resolved.  He participated with PT and was found to be deconditioned and would benefit from CIR placement.  He remains on coumadin for his Mechanical valve.  It is very important that he receives close monitoring of PT/INR with goal level of 2.5.  He will be discharged on 1 mg daily for now, but he will require titration as needed.  He is tolerating a heart healthy diet.  He has been weaned off all drips.  He will require life vest at discharge.  He is felt medically stable for discharge today.                                          Consults: cardiology, pulmonary/intensive care and rehabilitation medicine  Treatments: surgery:    Replacement of ascending aorta/Wheat procedure  with hypothermic circulatory arrest and cardiopulmonary bypass with a 32 mm Dacron Hemashield woven graft  Disposition: 01-Home or Self Care   Discharge Medications:  The patient has been discharged on:   1.Beta Blocker:  Yes [   ]                              No   [ x  ]                              If No, reason:advanced HF and reduced EF  2.Ace Inhibitor/ARB: Yes [   ]                                     No  [ x   ]                                     If No, reason: NO CAD, labile BP  3.Statin:   Yes [   ]                  No  [ x  ]                  If No, reason: elevated LFTs  4.Ecasa:  Yes  [   ]                  No   [ x  ]                  If No, reason: on coumadin, no CAD     Allergies as of 05/23/2017      Reactions   No Known Allergies       Medication List    STOP taking these medications   carvedilol 3.125 MG tablet Commonly known as:  COREG   CITRUCEL oral powder Generic drug:  methylcellulose   enoxaparin 60 MG/0.6ML injection Commonly known as:  LOVENOX   losartan 25 MG tablet Commonly known as:  COZAAR   sildenafil 20 MG tablet Commonly known as:  REVATIO     TAKE these  medications   amiodarone 200 MG tablet Commonly known as:  PACERONE Take 1 tablet (200 mg total) by mouth daily.   colchicine 0.6 MG tablet Take 0.6 mg by mouth daily as needed.   diphenhydrAMINE 12.5 MG/5ML elixir Commonly known as:  BENADRYL Take 5 mLs (12.5 mg total) by mouth at bedtime as needed for sleep.   food thickener Powd Commonly known as:  THICK IT As needed   guaiFENesin 100 MG/5ML Soln Commonly known as:  ROBITUSSIN Take 5 mLs (100 mg total) by mouth every 4 (four) hours as needed for cough or to loosen phlegm.   levalbuterol 0.63 MG/3ML nebulizer solution Commonly known as:  XOPENEX Take 3 mLs (0.63 mg total) by nebulization 3 (three) times daily.   loperamide 1 MG/5ML solution Commonly known as:  IMODIUM Take 5 mLs (1 mg total) by mouth as needed for diarrhea or loose stools.   warfarin 1 MG tablet Commonly known as:  COUMADIN Take 1 tablet (1 mg total) by mouth one time only at 6 PM. What changed:  medication strength  how much  to take  how to take this  when to take this  additional instructions      Follow-up Information    [name], MD Follow up on 06/27/2017.   Specialty:  Cardiothoracic Surgery Why:  Appointment is at 10:30, please get CXR at 10 at [facility] located on first floor of our office building Contact information: [address] [place] [address] [phone]        [place], Peter M, MD Follow up on 05/29/2017.   Specialty:  Cardiology Why:  Appointment is at 4:20 Contact information: [address] [place] [address] [phone]           Signed: Lowella Dandy 05/23/2017, 10:28 AM

## 2017-05-23 NOTE — Progress Notes (Signed)
Discussed with Caralyn Guile. Await medical clearance to admit pt to inpt rehab hopefully today. Pacing wires pending removal. Life Vest at bedside. I will follow up today. [phone]

## 2017-05-23 NOTE — Progress Notes (Signed)
CARDIAC REHAB PHASE I   Pt discharging to inpatient rehab today. Pt very tired, wife at bedside. Briefly reviewed CHF booklet, heart healthy diet handout, and phase 2 cardiac rehab. Pt not appropriate for exercise guidelines at this time. Pt did verbalize interest in phase 2 cardiac rehab, will send referral to [facility] per pt request. Pt in bed, call bell within reach.   [phone] Joylene Grapes, RN, BSN 05/23/2017 12:30 PM

## 2017-05-23 NOTE — Care Management Note (Signed)
Case Management Note  Patient Details  Name: Svend Wilbur MRN: [id number] Date of Birth: 1946-08-27  Subjective/Objective:      From home with wife, s/p Redo Sternotomy, aorta ascending replacement.  plan is for CIR today.   Patient's wife states they have a supplemental policy , [employer] ID number is [id number], phone number is [phone].  Patient has life vest in room.                             Action/Plan: DC to CIR today.   Expected Discharge Date:  05/23/17               Expected Discharge Plan:  IP Rehab Facility  In-House Referral:  Clinical Social Work  Discharge planning Services  CM Consult  Post Acute Care Choice:  Durable Medical Equipment Choice offered to:     DME Arranged:  Life vest DME Agency:  [facility]  HH Arranged:    HH Agency:     Status of Service:  Completed, signed off  If discussed at [employer], dates discussed:    Additional Comments:  Leone Haven, RN 05/23/2017, 11:02 AM

## 2017-05-23 NOTE — Progress Notes (Signed)
Patient and family were informed about rehab process including patient safety plan and rehab booklet. 

## 2017-05-23 NOTE — Progress Notes (Signed)
ANTICOAGULATION CONSULT NOTE - Initial Consult  Pharmacy Consult for Warfarin per MD Indication: mechanical aortic valve  Allergies  Allergen Reactions  . No Known Allergies     Patient Measurements: Height: 5\' 10"  (177.8 cm) Weight: 143 lb 15.4 oz (65.3 kg) IBW/kg (Calculated) : 73  Vital Signs: Temp: 98.1 F (36.7 C) (07/12 0815) Temp Source: Axillary (07/12 0815) BP: 117/70 (07/12 0900) Pulse Rate: 87 (07/12 0900)  Labs:  Recent Labs  05/21/17 0341 05/21/17 1611 05/22/17 0456 05/22/17 1617 05/23/17 0353  HGB 9.1* 10.2*  --  10.9* 10.1*  HCT 30.2* 30.0*  --  32.0* 32.0*  PLT 606*  --   --   --  511*  LABPROT 33.7*  --  31.5*  --  27.5*  INR 3.23  --  2.96  --  2.51  CREATININE 0.69 0.60*  --  0.60* 0.75    Estimated Creatinine Clearance: 77.1 mL/min (by C-G formula based on SCr of 0.75 mg/dL).   Medical History: Past Medical History:  Diagnosis Date  . AVD (aortic valve disease)   . Chronic anticoagulation   . Dilated cardiomyopathy (HCC)    EF 30-35%  . Dysrhythmia   . Hyperlipidemia   . LV dysfunction     Assessment: Coumadin resumed per MD 7/1 >> held 7/6 >> resumed 7/11 at 1mg  Pt to be admitted to rehab 7/12  Warfarin pta for mech aortic valve (goal per [facility] 2.5-3.5), warfarin held and lovenox bridge started pre-op- last dose taken 6/17, last dose of warfarin was taken 6/10 (home dose: 5mg  daily except 2.5mg  on Mondays and Thursdays) - INR 4.07 >> 3.23 >> 2.96 (warfarin resumed, 1 mg x1) >> 2.51   Goal of Therapy:  INR 2.5-3.5 Monitor platelets by anticoagulation protocol: Yes   Plan:  Warfarin 1 mg Daily INR  Massa Pe N. Zigmund Daniel, PharmD PGY1 Pharmacy Resident Pager: [phone]

## 2017-05-23 NOTE — Progress Notes (Signed)
Per notes plan for admission to CIR today- CSW signing off  Burna Sis, LCSW Clinical Social Worker [phone]

## 2017-05-23 NOTE — Progress Notes (Signed)
  Speech Language Pathology Treatment: Dysphagia  Patient Details Name: Jack Joseph MRN: [id number] DOB: June 30, 1946 Today's Date: 05/23/2017 Time: 2563-8937 SLP Time Calculation (min) (ACUTE ONLY): 20 min  Assessment / Plan / Recommendation Clinical Impression  Skilled treatment session focused on RMT assessment. Pt with MIP of 11 cm H2O and MEP of 26 cm H2O. Pt's reference for a male his age for MIP of 90 cm H2O and lower limit of normal being 51 cm H2O. Pt's MEP reference for a male his age is 114 cm H2O and lower limit of normal is 57 cm H2O. Pt's values are below LLN and RMT appears indicated at this time. Will set trainer levels at next venue of care.    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 Underwent ascending aortic replacement/wheat procedure with CABG. Was progressing well post op and ambulating in hall. However just after midnight 6/20 had VT arrest with CPR and shock x 3. Intubated during ACLS.  Right pneumothorax with chest tube x2. ETT 6/20-6/21; reintubated 6/21-6/24, reintubated 6/26-7/1.       SLP Plan  Continue with current plan of care                      General recommendations: Rehab consult Oral Care Recommendations: Oral care BID Follow up Recommendations: Inpatient Rehab SLP Visit Diagnosis: Dysphagia, pharyngeal phase (R13.13) Plan: Continue with current plan of care       GO               Quiana Cobaugh B. Dreama Saa, M.S., CCC-SLP Speech-Language Pathologist  Oleva Koo 05/23/2017, 11:01 AM

## 2017-05-23 NOTE — Progress Notes (Signed)
Ranelle Oyster, MD Physician Signed Physical Medicine and Rehabilitation  Consult Note Date of Service: 05/16/2017 8:12 AM  Related encounter: Admission (Discharged) from 04/29/2017 in [facility] 2H CARDIOVASCULAR ICU     Expand All Collapse All   [] Hide copied text [] Hover for attribution information      Physical Medicine and Rehabilitation Consult  Reason for Consult: Debility Referring Physician: Dr. Tyrone Sage.    HPI: Jack Joseph is a 71 y.o. male with history of CAD s/pCABG, chronic systolic CHF s/p AVR '83- chronic coumadin, COPD who was admitted on 04/29/17 for redo sternotomy and repair of ascending aortic dilation/Wheat procedure.  Post op course complicated by VT with PEA arrest requiring CPR/ACLS protocol and intubation, R-PTX with large amount of subcutaneous emphysema right lateral chest, hypotension requiring pressors, severe left atelectasis with mucous plugging requiring reintubation and hypoxemia. Dr. Ladona Ridgel consulted for input and anticipated ICD insertion prior to discharge to home.  He tolerated extubation on 7/1 and was started on dysphagia 3, nectar liquids on 7/3 due to trace aspiration of thins. PT evaluation done yesterday revealing deconditioned state with significant decline in mobility. CIR recommended for follow up therapy.     Review of Systems  HENT: Negative for hearing loss and tinnitus.   Eyes: Negative for blurred vision and double vision.  Respiratory: Negative for cough and shortness of breath.   Cardiovascular: Negative for chest pain, palpitations and leg swelling.  Gastrointestinal: Positive for diarrhea. Negative for heartburn and nausea.  Genitourinary: Negative for dysuria and urgency.  Musculoskeletal: Positive for joint pain (chronic left knee pain). Negative for myalgias.  Neurological: Positive for speech change and weakness. Negative for dizziness, sensory change, focal weakness and headaches.  Psychiatric/Behavioral:  Positive for memory loss.          Past Medical History:  Diagnosis Date  . AVD (aortic valve disease)   . Chronic anticoagulation   . Dilated cardiomyopathy (HCC)    EF 30-35%  . Dysrhythmia   . Hyperlipidemia   . LV dysfunction     Past Surgical History:  Procedure Laterality Date  . AORTIC VALVE REPLACEMENT    . BENTALL PROCEDURE N/A 04/29/2017   Procedure: REPLACEMENT ASCENDING AORTA/ WHEAT PROCEDURE, HYPOTHERMIC CIRCULATORY ARREST AND CARDIOPULMONARY BYPASS, AND USE OF HEMASHIELD PLATINUM WOVEN DOUBLE VELOUR VASCULAR GRAFT 32 MM X 50 CM;  Surgeon: [name], MD;  Location: [facility] OR;  Service: Open Heart Surgery;  Laterality: N/A;  . DOPPLER ECHOCARDIOGRAPHY  04/07/2002   EF 30-35%  . RIGHT/LEFT HEART CATH AND CORONARY ANGIOGRAPHY N/A 03/05/2017   Procedure: Right/Left Heart Cath and Coronary Angiography;  Surgeon: Peter M Swaziland, MD;  [facility] INVASIVE CV LAB;  Service: Cardiovascular;  Laterality: N/A;  . TEE WITHOUT CARDIOVERSION N/A 04/29/2017   Procedure: TRANSESOPHAGEAL ECHOCARDIOGRAM (TEE);  Surgeon: [name], MD;  Location: [facility] OR;  Service: Open Heart Surgery;  Laterality: N/A;         Family History  Problem Relation Age of Onset  . Heart disease Brother     Social History:  Married. Independent and was working  6 hours/5 days a week for [employer]. He reports that he quit smoking about 16 years ago. He has never used smokeless tobacco. He reports that he does not drink alcohol or use drugs.        Allergies  Allergen Reactions  . No Known Allergies           Medications Prior to Admission  Medication Sig Dispense Refill  . carvedilol (COREG) 3.125 MG tablet Take 2 tablets (6.25 mg total) by mouth 2 (two) times daily. 180 tablet 3  . colchicine 0.6 MG tablet Take 0.6 mg by mouth daily as needed.    . enoxaparin (LOVENOX) 60 MG/0.6ML injection Inject 0.6 mLs (60 mg total) into the skin every 12  (twelve) hours. 20 Syringe 0  . losartan (COZAAR) 25 MG tablet Take 1 tablet (25 mg total) by mouth daily. 30 tablet 11  . methylcellulose (CITRUCEL) oral powder Take 1 packet by mouth 2 (two) times daily.     . sildenafil (REVATIO) 20 MG tablet TAKE 1-5 TABLETS BY MOUTH AS NEEDED FOR SEXUAL ACTIVITY (Patient not taking: Reported on 04/25/2017) 50 tablet 1  . warfarin (COUMADIN) 5 MG tablet Take 1 tablet by mouth daily or as directed by [facility] (Patient not taking: Reported on 04/25/2017) 90 tablet 1    Home: Home Living Family/patient expects to be discharged to:: Private residence Living Arrangements: Spouse/significant other Available Help at Discharge: Family, Available 24 hours/day Type of Home: House Home Access: Stairs to enter [employer] of Steps: 3 Entrance Stairs-Rails: None Home Layout: One level Bathroom Shower/Tub: Engineer, manufacturing systems: Standard Bathroom Accessibility: Yes Home Equipment: Shower seat, Crutches, Cane - single point, Grab bars - tub/shower Additional Comments: worked 6 hours day, 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  Functional History: Prior Function Level of Independence: Independent Functional Status:  Mobility: Bed Mobility Overal bed mobility: Needs Assistance Bed Mobility: Rolling, Sidelying to Sit Rolling: Min assist Sidelying to sit: Mod assist General bed mobility comments: Needed cues for sternal precautions as well as assist for elevation of trunk.  Pt hugging pillow.   Transfers Overall transfer level: Needs assistance Equipment used: Rolling walker (2 wheeled) Transfers: Sit to/from Stand, [employer] Transfers Sit to Stand: Mod assist, +2 safety/equipment, From elevated surface Stand pivot transfers: Min assist, +2 physical assistance General transfer comment: Pt said he had to have BM once he sat up therefore obtained 3N1 and assisted pt onto 3N1 with assist as above with pt needing assist  to power up and steadying assist as he is uncoordinated.  Pt with BM as he was transferring but only a small amount.  Loose stool.  Needed same assist to stand to RW once done and total assist to clean pt of BM.   Ambulation/Gait Ambulation/Gait assistance: Mod assist, +2 safety/equipment Ambulation Distance (Feet): 100 Feet (50 feet x 2) Assistive device: Rolling walker (2 wheeled) Gait Pattern/deviations: Decreased step length - right, Decreased step length - left, Decreased stride length, Shuffle, Leaning posteriorly, Drifts right/left, Trunk flexed, Wide base of support General Gait Details: Pt needed cues to stand tall and stay close to RW.  Slightly flexed and had difficulty with upright posture needing assist and cues throghout.  Uncoordinated gait at times.  Assist to steer RW as well.   Gait velocity interpretation: Below normal speed for age/gender  ADL:  Cognition: Cognition Overall Cognitive Status: Within Functional Limits for tasks assessed Orientation Level: Oriented to person, Oriented to time, Oriented to place, Disoriented to situation Cognition Arousal/Alertness: Awake/alert Behavior During Therapy: Anxious Overall Cognitive Status: Within Functional Limits for tasks assessed   Blood pressure 111/60, pulse 93, temperature 98.6 F (37 C), temperature source Oral, resp. rate 15, height 5\' 10"  (1.778 m), weight 67.3 kg (148 lb 5.9 oz), SpO2 93 %. Physical Exam  Nursing note and vitals reviewed. Constitutional: He appears well-developed.  No distress.  Thin adult male with sitter in the room. Congest oral sounds with hoarse voice.  HENT:  Head: Normocephalic and atraumatic.  Eyes: Conjunctivae and EOM are normal. Pupils are equal, round, and reactive to light.  Neck: Normal range of motion. Neck supple.  Cardiovascular: Normal rate and regular rhythm.   Midline incision clean,dry and intact. Pacer wires in place.   Respiratory: Effort normal. No stridor. No  respiratory distress. He has decreased breath sounds in the right lower field and the left lower field. He has no wheezes. He exhibits no tenderness.  GI: Soft. Bowel sounds are normal. He exhibits no distension. There is no tenderness.  Musculoskeletal: He exhibits no edema or tenderness.  Neurological: He is alert.  Wet dysphonic voice. Disoriented initially but started clearing up with cues. He was able to recall date and follow basic commands without difficulty. Memory deficits and lack of awareness noted. UE 3/5 prox to distal. LE: 3-/5 HF, KE and 4/5 ADF/PF. Senses pain in all 4's.   Skin: Skin is warm and dry. He is not diaphoretic.  Psychiatric: His mood appears anxious. Cognition and memory are impaired. He expresses inappropriate judgment.    Lab Results Last 24 Hours       Results for orders placed or performed during the hospital encounter of 04/29/17 (from the past 24 hour(s))  Heparin level (unfractionated)     Status: None   Collection Time: 05/15/17  1:00 PM  Result Value Ref Range   Heparin Unfractionated 0.59 0.30 - 0.70 IU/mL  I-STAT, chem 8     Status: Abnormal   Collection Time: 05/15/17  5:16 PM  Result Value Ref Range   Sodium 141 135 - 145 mmol/L   Potassium 3.8 3.5 - 5.1 mmol/L   Chloride 105 101 - 111 mmol/L   BUN 20 6 - 20 mg/dL   Creatinine, Ser 1.61 0.61 - 1.24 mg/dL   Glucose, Bld 96 65 - 99 mg/dL   Calcium, Ion 0.96 1.15 - 1.40 mmol/L   TCO2 26 0 - 100 mmol/L   Hemoglobin 9.2 (L) 13.0 - 17.0 g/dL   HCT 04.5 (L) 40.9 - 81.1 %  Protime-INR     Status: Abnormal   Collection Time: 05/16/17  4:15 AM  Result Value Ref Range   Prothrombin Time 20.3 (H) 11.4 - 15.2 seconds   INR 1.72   CBC     Status: Abnormal   Collection Time: 05/16/17  4:15 AM  Result Value Ref Range   WBC 10.3 4.0 - 10.5 K/uL   RBC 3.02 (L) 4.22 - 5.81 MIL/uL   Hemoglobin 8.9 (L) 13.0 - 17.0 g/dL   HCT 91.4 (L) 78.2 - 95.6 %   MCV 95.7 78.0 - 100.0 fL    MCH 29.5 26.0 - 34.0 pg   MCHC 30.8 30.0 - 36.0 g/dL   RDW 21.3 08.6 - 57.8 %   Platelets 851 (H) 150 - 400 K/uL  Basic metabolic panel     Status: Abnormal   Collection Time: 05/16/17  4:15 AM  Result Value Ref Range   Sodium 141 135 - 145 mmol/L   Potassium 3.4 (L) 3.5 - 5.1 mmol/L   Chloride 108 101 - 111 mmol/L   CO2 25 22 - 32 mmol/L   Glucose, Bld 111 (H) 65 - 99 mg/dL   BUN 24 (H) 6 - 20 mg/dL   Creatinine, Ser 4.69 0.61 - 1.24 mg/dL   Calcium 8.3 (L) 8.9 - 10.3 mg/dL  GFR calc non Af Amer >60 >60 mL/min   GFR calc Af Amer >60 >60 mL/min   Anion gap 8 5 - 15  Heparin level (unfractionated)     Status: None   Collection Time: 05/16/17  4:32 AM  Result Value Ref Range   Heparin Unfractionated 0.41 0.30 - 0.70 IU/mL  Glucose, capillary     Status: None   Collection Time: 05/16/17  7:58 AM  Result Value Ref Range   Glucose-Capillary 92 65 - 99 mg/dL   Comment 1 Venous Specimen       Imaging Results (Last 48 hours)  Dg Chest Port 1 View  Result Date: 05/16/2017 CLINICAL DATA:  70 year old male postoperative day 17 status post cardiothoracic surgery, replacement of ascending aorta/Wheat procedure. Right chest tube removal yesterday. EXAM: PORTABLE CHEST 1 VIEW COMPARISON:  05/15/2017 and earlier. FINDINGS: Portable AP semi upright view at 0611 hours. Mild volume of bilateral upper chest and supraclavicular subcutaneous emphysema is stable. Skin fold artifact. No pneumothorax identified. Stable right PICC line.  Epicardial pacer leads remain in place. Stable cardiac size and mediastinal contours. Left lower lobe collapse or consolidation with small left pleural effusion. No pulmonary edema. Stable ventilation since yesterday. IMPRESSION: 1. No pneumothorax or new cardiopulmonary abnormality identified. 2. Stable left lower lobe collapse or consolidation and probable small left pleural effusion. Electronically Signed   By: Odessa Fleming M.D.   On: 05/16/2017 07:24    Dg Chest Port 1 View  Result Date: 05/15/2017 CLINICAL DATA:  Chest tube removal this morning. EXAM: PORTABLE CHEST 1 VIEW COMPARISON:  Most recent radiographs earlier this day. Chest CT 05/07/2017 FINDINGS: Right pigtail catheter is been removed. Previous tiny apical pneumothorax is tentatively identified and unchanged. No progression. Right upper extremity PICC tip in the mid SVC. Linear peripheral opacity in the right midlung may be minimal fluid in the fissure or atelectasis, this appears along the course of prior chest tube. Stable cardiomegaly, median sternotomy and prosthetic valve. Stable retrocardiac opacity in left pleural effusion. Subcutaneous emphysema again seen, not significantly changed. IMPRESSION: 1. Removal of right pigtail catheter. Unchanged tiny right apical pneumothorax from exam earlier this day. 2. Minimal developing linear opacity in the right mid lung may be pleural fluid in the fissure or atelectasis/scarring, may be along the course of prior chest tube. 3. Unchanged left lung base opacity and pleural effusion. Electronically Signed   By: Rubye Oaks M.D.   On: 05/15/2017 20:01   Dg Chest Port 1 View  Result Date: 05/15/2017 CLINICAL DATA:  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 Right chest tube in place. EXAM: PORTABLE CHEST 1 VIEW COMPARISON:  Single-view of the chest 05/14/2017 and 05/13/2017. FINDINGS: Small bore right chest tube remains in place. There is a tiny right apical pneumothorax, unchanged. Subcutaneous emphysema is seen over the chest. Left pleural effusion appears slightly decreased compared to yesterday's examination. Left basilar airspace disease is unchanged. Prosthetic aortic valve and intact median sternotomy wires are unchanged. IMPRESSION: Tiny right apical pneumothorax with a chest tube in place, unchanged. Slight decrease in a left pleural effusion. Left basilar airspace disease unchanged. Electronically Signed    By: Drusilla Kanner M.D.   On: 05/15/2017 08:45   Dg Chest Port 1 View  Result Date: 05/14/2017 CLINICAL DATA:  Status post chest tube removal EXAM: PORTABLE CHEST 1 VIEW COMPARISON:  05/13/2017 FINDINGS: Cardiac shadow is stable. Postoperative changes are again seen. Moderate left-sided pleural effusion is again noted. The large bore  chest tube on the right has been removed in the interval. Stable apical right pneumothorax is seen. Small bore chest tube remains in place. A right-sided PICC line is noted and stable. Feeding catheter has been removed. IMPRESSION: Stable right apical pneumothorax following chest tube removal. Stable moderate left pleural effusion. Tubes and lines as described. Electronically Signed   By: Alcide Clever M.D.   On: 05/14/2017 08:44   Dg Swallowing Func-speech Pathology  Result Date: 05/14/2017 Objective Swallowing Evaluation: Type of Study: MBS-Modified Barium Swallow Study Patient Details Name: Johnel Yielding MRN: [id number] Date of Birth: 1946-10-28 Today's Date: 05/14/2017 Time: SLP Start Time (ACUTE ONLY): 0930-SLP Stop Time (ACUTE ONLY): 1030 SLP Time Calculation (min) (ACUTE ONLY): 60 min Past Medical History: Past Medical History: Diagnosis Date . AVD (aortic valve disease)  . Chronic anticoagulation  . Dilated cardiomyopathy (HCC)   EF 30-35% . Dysrhythmia  . Hyperlipidemia  . LV dysfunction  Past Surgical History: Past Surgical History: Procedure Laterality Date . AORTIC VALVE REPLACEMENT   . BENTALL PROCEDURE N/A 04/29/2017  Procedure: REPLACEMENT ASCENDING AORTA/ WHEAT PROCEDURE, HYPOTHERMIC CIRCULATORY ARREST AND CARDIOPULMONARY BYPASS, AND USE OF HEMASHIELD PLATINUM WOVEN DOUBLE VELOUR VASCULAR GRAFT 32 MM X 50 CM;  Surgeon: [name], MD;  Location: [facility] OR;  Service: Open Heart Surgery;  Laterality: N/A; . DOPPLER ECHOCARDIOGRAPHY  04/07/2002  EF 30-35% . RIGHT/LEFT HEART CATH AND CORONARY ANGIOGRAPHY N/A 03/05/2017  Procedure: Right/Left Heart Cath and  Coronary Angiography;  Surgeon: Peter M Swaziland, MD;  Location: [facility] INVASIVE CV LAB;  Service: Cardiovascular;  Laterality: N/A; . TEE WITHOUT CARDIOVERSION N/A 04/29/2017  Procedure: TRANSESOPHAGEAL ECHOCARDIOGRAM (TEE);  Surgeon: [name], MD;  Location: [facility] OR;  Service: Open Heart Surgery;  Laterality: N/A; HPI: 71 yo male former smoker with hx remote AVR 1983 on chronic coumadin, COPD, LV dysfunction with EF 30-35%, dilated ascending aorta who initially presented 6/18 for elective surgical repair. Underwent ascending aortic replacement/wheat procedure with CABG. Was progressing well post op and ambulating in hall. However just after midnight 6/20 had VT arrest with CPR and shock x 3. Intubated during ACLS.  Right pneumothorax with chest tube x2. ETT 6/20-6/21; reintubated 6/21-6/24, reintubated 6/26-7/1.  Subjective: alert, participatory Assessment / Plan / Recommendation CHL IP CLINICAL IMPRESSIONS 05/14/2017 Clinical Impression Pt presents with remarkably strong swallow function despite four intubations and deconditioning.   There is adequate mastication; timely swallow initiation; good mobility of hyolaryngeal complex, leading to adequate airway closure for most consistencies and strong pharyngeal clearance (no residue post-swallow).  Impairments were limited to decreased laryngeal closure during consumption of thin liquids, leading to trace silent aspiration.  A chin tuck adequately protected airway, but due to pt's fluctuating MS, he was not able to consistently execute chin tuck.  For now, recommend initiating a dysphagia 3 diet with nectar-thick liquids; meds whole in puree.  Discussed results and recs with pt/wife, who verbalize understanding.  SLP will follow for safety and diet progression.  SLP Visit Diagnosis Dysphagia, pharyngeal phase (R13.13) Attention and concentration deficit following -- Frontal lobe and executive function deficit following -- Impact on safety and function Mild  aspiration risk   CHL IP TREATMENT RECOMMENDATION 05/14/2017 Treatment Recommendations Therapy as outlined in treatment plan below   Prognosis 05/14/2017 Prognosis for Safe Diet Advancement Good Barriers to Reach Goals -- Barriers/Prognosis Comment -- CHL IP DIET RECOMMENDATION 05/14/2017 SLP Diet Recommendations Dysphagia 3 (Mech soft) solids;Nectar thick liquid Liquid Administration via Cup Medication Administration Whole meds with puree Compensations  Slow rate;Small sips/bites;Minimize environmental distractions Postural Changes Seated upright at 90 degrees   CHL IP OTHER RECOMMENDATIONS 05/14/2017 Recommended Consults -- Oral Care Recommendations Oral care BID Other Recommendations Order thickener from pharmacy   CHL IP FOLLOW UP RECOMMENDATIONS 05/14/2017 Follow up Recommendations (No Data)   CHL IP FREQUENCY AND DURATION 05/14/2017 Speech Therapy Frequency (ACUTE ONLY) min 2x/week Treatment Duration 2 weeks      CHL IP ORAL PHASE 05/14/2017 Oral Phase WFL Oral - Pudding Teaspoon -- Oral - Pudding Cup -- Oral - Honey Teaspoon -- Oral - Honey Cup -- Oral - Nectar Teaspoon -- Oral - Nectar Cup -- Oral - Nectar Straw -- Oral - Thin Teaspoon -- Oral - Thin Cup -- Oral - Thin Straw -- Oral - Puree -- Oral - Mech Soft -- Oral - Regular -- Oral - Multi-Consistency -- Oral - Pill -- Oral Phase - Comment --  CHL IP PHARYNGEAL PHASE 05/14/2017 Pharyngeal Phase Impaired Pharyngeal- Pudding Teaspoon -- Pharyngeal -- Pharyngeal- Pudding Cup -- Pharyngeal -- Pharyngeal- Honey Teaspoon -- Pharyngeal -- Pharyngeal- Honey Cup -- Pharyngeal -- Pharyngeal- Nectar Teaspoon -- Pharyngeal -- Pharyngeal- Nectar Cup WFL Pharyngeal -- Pharyngeal- Nectar Straw -- Pharyngeal -- Pharyngeal- Thin Teaspoon -- Pharyngeal -- Pharyngeal- Thin Cup Reduced airway/laryngeal closure;Trace aspiration;Penetration/Aspiration during swallow Pharyngeal Material enters airway, passes BELOW cords without attempt by patient to eject out (silent aspiration) Pharyngeal-  Thin Straw -- Pharyngeal -- Pharyngeal- Puree WFL Pharyngeal -- Pharyngeal- Mechanical Soft -- Pharyngeal -- Pharyngeal- Regular WFL Pharyngeal -- Pharyngeal- Multi-consistency -- Pharyngeal -- Pharyngeal- Pill -- Pharyngeal -- Pharyngeal Comment --  CHL IP CERVICAL ESOPHAGEAL PHASE 05/14/2017 Cervical Esophageal Phase WFL Pudding Teaspoon -- Pudding Cup -- Honey Teaspoon -- Honey Cup -- Nectar Teaspoon -- Nectar Cup -- Nectar Straw -- Thin Teaspoon -- Thin Cup -- Thin Straw -- Puree -- Mechanical Soft -- Regular -- Multi-consistency -- Pill -- Cervical Esophageal Comment -- No flowsheet data found. [name] [name] 05/14/2017, 1:00 PM                Assessment/Plan: Diagnosis: debility after multiple sternotomy/Wheat procedure 1. Does the need for close, 24 hr/day medical supervision in concert with the patient's rehab needs make it unreasonable for this patient to be served in a less intensive setting? Yes 2. Co-Morbidities requiring supervision/potential complications: CHF, HTN, encephalopathy 3. Due to bladder management, bowel management, safety, skin/wound care, disease management, medication administration, pain management and patient education, does the patient require 24 hr/day rehab nursing? Yes 4. Does the patient require coordinated care of a physician, rehab nurse, PT (1-2 hrs/day, 5 days/week), OT (1-2 hrs/day, 5 days/week) and SLP (1-2 hrs/day, 5 days/week) to address physical and functional deficits in the context of the above medical diagnosis(es)? Yes Addressing deficits in the following areas: balance, endurance, locomotion, strength, transferring, bowel/bladder control, bathing, dressing, feeding, grooming, toileting, cognition, speech, swallowing and psychosocial support 5. Can the patient actively participate in an intensive therapy program of at least 3 hrs of therapy per day at least 5 days per week? Yes 6. The potential for patient to make measurable gains while on inpatient  rehab is excellent 7. Anticipated functional outcomes upon discharge from inpatient rehab are supervision and min assist  with PT, supervision and min assist with OT, modified independent and supervision with SLP. 8. Estimated rehab length of stay to reach the above functional goals is: 14-19 days 9. Anticipated D/C setting: Home 10. Anticipated post D/C treatments: HH therapy and Outpatient therapy 11. Overall  Rehab/Functional Prognosis: excellent  RECOMMENDATIONS: This patient's condition is appropriate for continued rehabilitative care in the following setting: CIR Patient has agreed to participate in recommended program. Yes Note that insurance prior authorization may be required for reimbursement for recommended care.  Comment: Rehab Admissions Coordinator to follow up.  Thanks,  Ranelle Oyster, MD, Earlie Counts, PA-C 05/16/2017    Revision History                        Routing History

## 2017-05-23 NOTE — Progress Notes (Signed)
Jack Gong, RN Rehab Admission Coordinator Signed Physical Medicine and Rehabilitation  PMR Pre-admission Date of Service: 05/17/2017 1:08 PM  Related encounter: Admission (Discharged) from 04/29/2017 in [place] ICU       _0 Hide copied text PMR Admission Coordinator Pre-Admission Assessment  Patient: Jack Joseph is an 71 y.o., male MRN: [id number] DOB: 08-04-1946 Height: _1  (177.8 cm) Weight: 65.3 kg (143 lb 15.4 oz)                                                                                                                                                  Insurance Information HMO: No    PPO:       PCP:       IPA:       80/20:       OTHER:   PRIMARY:  Medicare A/B      Policy#: [id number]      Subscriber: Jack Joseph CM Name:        Phone#:       Fax#:   Pre-Cert#:        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 Date: 03/12/10     Deduct:  $1340      Out of Pocket Max: none      Life Max: N/a CIR: 100%      SNF: 100 days Outpatient: 80%     Co-Pay: 20% Home Health: 100%      Co-Pay: none DME: 80%     Co-Pay: 20% Providers: patient's choice  SECONDARY: [employer]      Policy#:        Subscriber: Patient CM Name:        Phone#:       Fax#:   Pre-Cert#:        Employer:  PT Benefits:  Phone #:  [phone]     Name:   Eff. Date:       Deduct: $2240 (met $1698.51)      Out of Pocket Max:        Life Max:   CIR:        SNF:   Outpatient:       Co-Pay:   Home Health:        Co-Pay:   DME:       Co-Pay:    Emergency Contact Information        Contact Information    Name Relation Home Work Mobile   Jack Joseph Spouse [phone]     Jack Joseph Daughter   [phone]   Jack Joseph   [phone]     Current Medical History  Patient Admitting Diagnosis:Debility after multiple sternotomy/Wheat procedure    History of Present Illness: A  71 y.o.malewith history of CAD  s/pCABG, chronic systolic CHF s/p AVR '83- chronic coumadin, COPD who was admitted on 04/29/17 for redo sternotomy and repair of ascending aortic dilation/Wheat procedure. Post op course complicated by VT with PEA arrest requiring CPR/ACLS protocol and intubation, R-PTX with large amount of subcutaneous emphysema right lateral chest, hypotension requiring pressors, severe left atelectasis with mucous plugging requiring reintubation and hypoxemia. Dr. Lovena Le consulted for input and anticipated ICD insertion prior to discharge to home. He tolerated extubation on 7/1 and was started on dysphagia 3, nectar liquids on 7/3 due to trace aspiration of thins. PT evaluation done yesterday revealing deconditioned state with significant decline in mobility. CIR recommended for follow up therapy.    Past Medical History      Past Medical History:  Diagnosis Date  . AVD (aortic valve disease)   . Chronic anticoagulation   . Dilated cardiomyopathy (HCC)    EF 30-35%  . Dysrhythmia   . Hyperlipidemia   . LV dysfunction     Family History  family history includes Heart disease in his brother.  Prior Rehab/Hospitalizations: No previous rehab admissions.  Has the patient had major surgery during 100 days prior to admission? No  Current Medications   Current Facility-Administered Medications:  .  0.9 %  sodium chloride infusion, , Intravenous, Continuous, Mannam, Praveen, MD, Last Rate: 10 mL/hr at 05/21/17 0700 .  amiodarone (PACERONE) tablet 200 mg, 200 mg, Oral, Daily, Evans Lance, MD, 200 mg at 05/22/17 0937 .  bisacodyl (DULCOLAX) EC tablet 10 mg, 10 mg, Oral, Daily PRN **OR** bisacodyl (DULCOLAX) suppository 10 mg, 10 mg, Rectal, Daily PRN, [name], MD .  Chlorhexidine Gluconate Cloth 2 % PADS 6 each, 6 each, Topical, Daily, [name], MD, 6 each at 05/23/17 0418 .  diphenhydrAMINE (BENADRYL) 12.5 MG/5ML elixir 12.5 mg, 12.5 mg,  Oral, QHS PRN, [name], MD .  food thickener (THICK IT) powder, , Oral, PRN, [name], MD .  guaiFENesin (ROBITUSSIN) 100 MG/5ML solution 100 mg, 5 mL, Per Tube, Q4H PRN, Mannam, Praveen, MD, 100 mg at 05/12/17 2224 .  levalbuterol (XOPENEX) nebulizer solution 0.63 mg, 0.63 mg, Nebulization, Q4H PRN, [name], MD, 0.63 mg at 05/23/17 0323 .  levalbuterol (XOPENEX) nebulizer solution 0.63 mg, 0.63 mg, Nebulization, TID, Rush Farmer, MD, 0.63 mg at 05/23/17 0755 .  loperamide (IMODIUM) 1 MG/5ML solution 1 mg, 1 mg, Oral, PRN, Melrose Nakayama, MD, 1 mg at 05/20/17 1130 .  magic mouthwash, 5 mL, Oral, TID, [place]cott Gum, Collier Salina, MD, 5 mL at 05/22/17 2225 .  MEDLINE mouth rinse, 15 mL, Mouth Rinse, BID, [name], MD, 15 mL at 05/22/17 2226 .  moving right along book, , Does not apply, Once, Lanelle Bal B, MD .  ondansetron [facility]) tablet 4 mg, 4 mg, Oral, Q6H PRN **OR** ondansetron (ZOFRAN) injection 4 mg, 4 mg, Intravenous, Q6H PRN, [name], MD .  oxyCODONE (Oxy IR/ROXICODONE) immediate release tablet 7.5 mg, 7.5 mg, Oral, Q3H PRN, [name], MD .  pantoprazole (PROTONIX) EC tablet 40 mg, 40 mg, Oral, Daily, [name], MD, 40 mg at 05/22/17 0937 .  potassium chloride 20 MEQ/15ML (10%) solution 20 mEq, 20 mEq, Oral, Daily, Melrose Nakayama, MD, 20 mEq at 05/22/17 0937 .  sodium chloride flush (NS) 0.9 % injection 3 mL, 3 mL, Intravenous, Q12H, Barrett, Erin R, PA-C, 3 mL at 05/22/17 2230 .  warfarin (COUMADIN) tablet 1 mg, 1 mg, Oral, ONCE-1800,  [name], MD .  Warfarin - Physician Dosing Inpatient, , Does not apply, q1800, Gaye Pollack, MD, Stopped at 05/18/17 1800  Patients Current Diet: Diet Heart Room service appropriate? 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  Home Assistive Devices / Equipment Home Assistive Devices/Equipment: None Home Equipment: Shower seat, Crutches, Cane - single point, Grab bars - tub/shower  Prior Device Use: Indicate devices/aids used by the patient prior to current illness, exacerbation or injury? None  Prior Functional Level Prior Function Level of Independence: Independent  Self Care: Did the patient need help bathing, dressing, using the toilet or eating? Independent  Indoor Mobility: Did the patient need assistance with walking from room to room (with or without device)? Independent  Stairs: Did the patient need assistance with internal or external stairs (with or without device)? Independent  Functional Cognition: Did the patient need help planning regular tasks such as shopping or remembering to take medications? Independent  Current Functional Level Cognition  Overall Cognitive Status: Within Functional Limits for tasks assessed Current Attention Level: Selective Orientation Level: Oriented X4    Extremity Assessment (includes Sensation/Coordination)  Upper Extremity Assessment: Generalized weakness  Lower Extremity Assessment: Defer to PT evaluation RLE Deficits / Details: grossly 3-/5 LLE Deficits / Details: grossly 3-/5    ADLs  Overall ADL's : Needs assistance/impaired Eating/Feeding: Minimal assistance, Sitting Eating/Feeding Details (indicate cue type and reason): due to lines  Grooming: Wash/dry hands, Wash/dry face, Oral care, Supervision/safety, Sitting Upper Body Bathing: Moderate assistance, Sitting Lower Body Bathing: Moderate assistance, Sit to/from stand Lower Body Bathing Details (indicate cue type and reason): Pt  able to don/doff socks with supervision  Upper Body Dressing : Moderate assistance, Sitting Lower Body Dressing: Moderate assistance, Sit to/from stand Toilet Transfer: Minimal assistance, BSC, RW, Stand-pivot Toileting- Clothing Manipulation and Hygiene: Moderate assistance, Sit to/from stand Functional mobility during ADLs: Minimal assistance, Rolling walker General ADL Comments: Pt requires assist due to generalized weakness     Mobility  Overal bed mobility: Needs Assistance Bed Mobility: Supine to Sit, Sit to Supine Rolling: Min guard Sidelying to sit: Min guard Sit to sidelying: Min assist General bed mobility comments: pt sitting in chair upon arrival; assist to bring bilat LE into bed    Transfers  Overall transfer level: Needs assistance Equipment used: Rolling walker (2 wheeled) Transfers: Sit to/from Stand, Stand Pivot Transfers Sit to Stand: Min assist Stand pivot transfers: Min assist General transfer comment: assist for balance     Ambulation / Gait / Stairs / Wheelchair Mobility  Ambulation/Gait Ambulation/Gait assistance: +2 safety/equipment, Min assist Ambulation Distance (Feet): 15 Feet Assistive device: Rolling walker (2 wheeled) Gait Pattern/deviations: Decreased stride length, Trunk flexed, Wide base of support, Step-through pattern General Gait Details: cues for posture and decreased weightbearing on RW; limited by loose stool Gait velocity interpretation: Below normal speed for age/gender    Posture / Balance Balance Overall balance assessment: Needs assistance Sitting-balance support: No upper extremity supported, Feet supported Sitting balance-Leahy Scale: Fair Standing balance support: Bilateral upper extremity supported, During functional activity Standing balance-Leahy Scale: Poor Standing balance comment: reliant on UE support.  Pt able to statically stand with  bil. UE support x 6 mins with min guard assist  High level balance activites:  Direction changes, Turns High Level Balance Comments: Needing close guard assist with turns with RW as he tended to let RW get away from him.     Special needs/care consideration BiPAP/CPAP No CPM No Continuous Drip IV KVO Dialysis No      Life Vest Yes, life vest to be in place Oxygen Has 02 [place] 1 1/2 to 2 L Special Bed No Trach Size No Wound Vac (area) No   Skin Fragile skin that tears easily.  Surgical insicion, abrasion left arm, buttocks with moisture associated skin damage left posterior lower buttocks                         Bowel mgmt: Diarrhea LBM 7/11 continent Bladder mgmt: Voiding in urinal Diabetic mgmt No    Previous Home Environment Living Arrangements: Spouse/significant other Available Help at Discharge: Family, Available 24 hours/day Type of Home: House Home Layout: One level Home Access: Stairs to enter Entrance Stairs-Rails: None Entrance Stairs-Number of Steps: 3 Bathroom Shower/Tub: Optometrist: Yes Home Care Services: No Additional Comments: worked 6 hours day, 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  Discharge Living Setting Plans for Discharge Living Setting: Patient's home, House, Lives with (comment) (Lives with wife.) Type of Home at Discharge: House Discharge Home Layout: One level Discharge Home Access: Stairs to enter Entrance Stairs-Number of Steps: 3 steps plus 1 threshold step into home Does the patient have any problems obtaining your medications?: No  Social/Family/Support Systems Patient Roles: Spouse, Parent (Has a wife, son, dtr, and grandchildren.) Contact Information: Gracyn Santillanes - wife - [phone] Anticipated Caregiver: wife Ability/Limitations of Caregiver: Wife can assist.  Wife is busy with her photography job and with volunteer work. Caregiver Availability: 24/7 Discharge Plan Discussed with Primary Caregiver: Yes Is Caregiver In Agreement with Plan?:  Yes Does Caregiver/Family have Issues with Lodging/Transportation while Pt is in Rehab?: No  Goals/Additional Needs Patient/Family Goal for Rehab: PT/OT supervision to min assist goals, SLP mod I and supervision goals. 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 Dietary Needs: Dys 3, nectar thick liquids Equipment Needs: TBD Pt/Family Agrees to Admission and willing to participate: Yes Program Orientation Provided & Reviewed with Pt/Caregiver Including Roles  & Responsibilities: Yes  Decrease burden of Care through IP rehab admission: N/A  Possible need for SNF placement upon discharge: Not planned  Patient Condition: This patient's medical and functional status has changed since the consult dated: 05/16/17 in which the Rehabilitation Physician determined and documented that the patient's condition is appropriate for intensive rehabilitative care in an inpatient rehabilitation facility. See "History of Present Illness" (above) for medical update. Functional changes are: overall min to mod assist. Patient's medical and functional status update has been discussed with the Rehabilitation physician and patient remains appropriate for inpatient rehabilitation. Will admit to inpatient rehab today.  Preadmission Screen Completed By: Karene Fry with updates by Cleatrice Burke, 05/23/2017 10:06 AM ______________________________________________________________________   Discussed status with Dr. Letta Pate on 05/23/2017 at 1006 and received telephone approval for admission today.  Admission Coordinator: Karene Fry with updates by  Cleatrice Burke, time 1006 Date 05/23/2017       Cosigned by: Charlett Blake, MD at 05/23/2017 10:11 AM  Revision History

## 2017-05-23 NOTE — Progress Notes (Signed)
DC EPW and chest tube sutures per MD order and protocol. Patient on bedrest for one hour. Call bell in reach, wife at bedside. Will continue to monitor.  Hermina Barters, RN

## 2017-05-23 NOTE — Progress Notes (Signed)
I will make the arrangements to admit pt to inpt rehab today, [phone]

## 2017-05-24 ENCOUNTER — Inpatient Hospital Stay (HOSPITAL_COMMUNITY): Payer: Medicare Other | Admitting: Occupational Therapy

## 2017-05-24 ENCOUNTER — Inpatient Hospital Stay (HOSPITAL_COMMUNITY): Payer: Medicare Other | Admitting: Speech Pathology

## 2017-05-24 ENCOUNTER — Inpatient Hospital Stay (HOSPITAL_COMMUNITY): Payer: Medicare Other

## 2017-05-24 DIAGNOSIS — Z7901 Long term (current) use of anticoagulants: Secondary | ICD-10-CM

## 2017-05-24 DIAGNOSIS — E46 Unspecified protein-calorie malnutrition: Secondary | ICD-10-CM

## 2017-05-24 DIAGNOSIS — J90 Pleural effusion, not elsewhere classified: Secondary | ICD-10-CM

## 2017-05-24 DIAGNOSIS — Z8674 Personal history of sudden cardiac arrest: Secondary | ICD-10-CM

## 2017-05-24 DIAGNOSIS — I5022 Chronic systolic (congestive) heart failure: Secondary | ICD-10-CM

## 2017-05-24 DIAGNOSIS — D62 Acute posthemorrhagic anemia: Secondary | ICD-10-CM

## 2017-05-24 DIAGNOSIS — R1310 Dysphagia, unspecified: Secondary | ICD-10-CM

## 2017-05-24 DIAGNOSIS — J9601 Acute respiratory failure with hypoxia: Secondary | ICD-10-CM

## 2017-05-24 LAB — COMPREHENSIVE METABOLIC PANEL
ALK PHOS: 85 U/L (ref 38–126)
ALT: 23 U/L (ref 17–63)
ANION GAP: 5 (ref 5–15)
AST: 20 U/L (ref 15–41)
Albumin: 2 g/dL — ABNORMAL LOW (ref 3.5–5.0)
BUN: 8 mg/dL (ref 6–20)
CALCIUM: 8.1 mg/dL — AB (ref 8.9–10.3)
CO2: 32 mmol/L (ref 22–32)
Chloride: 100 mmol/L — ABNORMAL LOW (ref 101–111)
Creatinine, Ser: 0.65 mg/dL (ref 0.61–1.24)
GFR calc non Af Amer: >60 mL/min (ref >60)
Glucose, Bld: 103 mg/dL — ABNORMAL HIGH (ref 65–99)
Potassium: 3.9 mmol/L (ref 3.5–5.1)
SODIUM: 137 mmol/L (ref 135–145)
Total Bilirubin: 0.5 mg/dL (ref 0.3–1.2)
Total Protein: 4.6 g/dL — ABNORMAL LOW (ref 6.5–8.1)

## 2017-05-24 LAB — CBC WITH DIFFERENTIAL/PLATELET
Basophils Absolute: 0 10*3/uL (ref 0.0–0.1)
Basophils Relative: 0 %
EOS ABS: 0.1 10*3/uL (ref 0.0–0.7)
EOS PCT: 1 %
HCT: 31.3 % — ABNORMAL LOW (ref 39.0–52.0)
HEMOGLOBIN: 9.5 g/dL — AB (ref 13.0–17.0)
Lymphocytes Relative: 13 %
Lymphs Abs: 1.2 10*3/uL (ref 0.7–4.0)
MCH: 28.5 pg (ref 26.0–34.0)
MCHC: 30.4 g/dL (ref 30.0–36.0)
MCV: 94 fL (ref 78.0–100.0)
MONOS PCT: 12 %
Monocytes Absolute: 1.1 10*3/uL — ABNORMAL HIGH (ref 0.1–1.0)
Neutro Abs: 6.8 10*3/uL (ref 1.7–7.7)
Neutrophils Relative %: 74 %
PLATELETS: 462 10*3/uL — AB (ref 150–400)
RBC: 3.33 MIL/uL — ABNORMAL LOW (ref 4.22–5.81)
RDW: 15 % (ref 11.5–15.5)
WBC: 9.1 10*3/uL (ref 4.0–10.5)

## 2017-05-24 LAB — PROTIME-INR
INR: 2.58
PROTHROMBIN TIME: 28.2 s — AB (ref 11.4–15.2)

## 2017-05-24 MED ORDER — FUROSEMIDE 10 MG/ML IJ SOLN
40.0000 mg | Freq: Once | INTRAMUSCULAR | Status: AC
  Administered 2017-05-24: 40 mg via INTRAVENOUS
  Filled 2017-05-24: qty 4

## 2017-05-24 MED ORDER — POTASSIUM CHLORIDE CRYS ER 20 MEQ PO TBCR
20.0000 meq | EXTENDED_RELEASE_TABLET | Freq: Every day | ORAL | Status: DC
  Filled 2017-05-24: qty 1

## 2017-05-24 MED ORDER — PRO-STAT SUGAR FREE PO LIQD
30.0000 mL | Freq: Two times a day (BID) | ORAL | Status: DC
  Administered 2017-05-24 – 2017-05-31 (×14): 30 mL via ORAL
  Filled 2017-05-24 (×14): qty 30

## 2017-05-24 MED ORDER — LOSARTAN POTASSIUM 25 MG PO TABS
12.5000 mg | ORAL_TABLET | Freq: Every day | ORAL | Status: DC
  Administered 2017-05-25 – 2017-05-31 (×2): 12.5 mg via ORAL
  Filled 2017-05-24 (×6): qty 1

## 2017-05-24 MED ORDER — WARFARIN SODIUM 5 MG PO TABS
2.5000 mg | ORAL_TABLET | Freq: Once | ORAL | Status: AC
  Administered 2017-05-24: 2.5 mg via ORAL
  Filled 2017-05-24: qty 1

## 2017-05-24 MED ORDER — FUROSEMIDE 40 MG PO TABS: 40.0000 mg | ORAL_TABLET | Freq: Every day | ORAL | Status: DC

## 2017-05-24 MED ORDER — POTASSIUM CHLORIDE 20 MEQ/15ML (10%) PO SOLN
20.0000 meq | Freq: Two times a day (BID) | ORAL | Status: DC
  Administered 2017-05-24 – 2017-05-26 (×5): 20 meq via ORAL
  Filled 2017-05-24 (×5): qty 15

## 2017-05-24 NOTE — Care Management (Signed)
Inpatient Rehabilitation Center Individual Statement of Services  Patient Name:  Jack Joseph  Date:  05/24/2017  Welcome to the Inpatient Rehabilitation Center.  Our goal is to provide you with an individualized program based on your diagnosis and situation, designed to meet your specific needs.  With this comprehensive rehabilitation program, you will be expected to participate in at least 3 hours of rehabilitation therapies Monday-Friday, with modified therapy programming on the weekends.  Your rehabilitation program will include the following services:  Physical Therapy (PT), Occupational Therapy (OT), Speech Therapy (ST), 24 hour per day rehabilitation nursing, Case Management (Social Worker), Rehabilitation Medicine, Nutrition Services and Pharmacy Services  Weekly team conferences will be held on Wednesday to discuss your progress.  Your Social Worker will talk with you frequently to get your input and to update you on team discussions.  Team conferences with you and your family in attendance may also be held.  Expected length of stay: 10-14 days Overall anticipated outcome: supervision-min assist level  Depending on your progress and recovery, your program may change. Your Social Worker will coordinate services and will keep you informed of any changes. Your Social Worker's name and contact numbers are listed  below.  The following services may also be recommended but are not provided by the Inpatient Rehabilitation Center:   Driving Evaluations  Home Health Rehabiltiation Services  Outpatient Rehabilitation Services  Vocational Rehabilitation   Arrangements will be made to provide these services after discharge if needed.  Arrangements include referral to agencies that provide these services.  Your insurance has been verified to be:  Medicare & commerical Your primary doctor is:  Betty [place]  Pertinent information will be shared with your doctor and your insurance  company.  Social Worker:  Dossie Der, SW [phone] or ([phone]  Information discussed with and copy given to patient by: Lucy Chris, 05/24/2017, 2:17 PM

## 2017-05-24 NOTE — Evaluation (Signed)
Speech Language Pathology Assessment and Plan  Patient Details  Name: Jack Joseph MRN: [id number] Date of Birth: 15-Jul-1946  SLP Diagnosis: Dysphagia;Dysarthria  Rehab Potential: Good ELOS: 10 to 14 days    Today's Date: 05/24/2017 SLP Individual Time: 1000-1100 SLP Individual Time Calculation (min): 60 min   Problem List:  Patient Active Problem List   Diagnosis Date Noted  . Acute blood loss anemia   . History of cardiac arrest   . Dysphagia   . Hypoalbuminemia due to protein-calorie malnutrition ([place])   . Pleural effusion   . Debility 05/23/2017  . Atelectasis   . Hyperglycemia   . Delirium   . Complication of chest tube   . Chest tube in place   . Acute encephalopathy   . Essential hypertension   . Dilatation of aorta (HCC)   . Postop check   . Acute respiratory failure with hypoxia ([place])   . VT (ventricular tachycardia) ([place])   . 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 Thoracic aortic aneurysm without rupture ([place]) 02/25/2017  . Mass of left lung 02/25/2017  . Acute on chronic systolic (congestive) heart failure ([place]) 02/25/2017  . CHF (congestive heart failure) ([place]) 07/10/2016  . Varicose veins of both lower extremities 07/10/2016  . Erectile dysfunction 07/10/2016  . AVD (aortic valve disease)   . Dilated cardiomyopathy ([place])   . Hyperlipidemia   . Chronic anticoagulation   . Hx of replacement of aortic valve 03/15/2011   Past Medical History:  Past Medical History:  Diagnosis Date  . AVD (aortic valve disease)   . Chronic anticoagulation   . Dilated cardiomyopathy (HCC)    EF 30-35%  . Dysrhythmia   . Hyperlipidemia   . LV dysfunction    Past Surgical History:  Past Surgical History:  Procedure Laterality Date  . AORTIC VALVE REPLACEMENT    . BENTALL PROCEDURE N/A 04/29/2017   Procedure: REPLACEMENT ASCENDING AORTA/ WHEAT PROCEDURE, HYPOTHERMIC CIRCULATORY ARREST AND CARDIOPULMONARY BYPASS, AND USE OF HEMASHIELD PLATINUM WOVEN  DOUBLE VELOUR VASCULAR GRAFT 32 MM X 50 CM;  Surgeon: [name], MD;  Location: [facility];  Service: Open Heart Surgery;  Laterality: N/A;  . DOPPLER ECHOCARDIOGRAPHY  04/07/2002   EF 30-35%  . RIGHT/LEFT HEART CATH AND CORONARY ANGIOGRAPHY N/A 03/05/2017   Procedure: Right/Left Heart Cath and Coronary Angiography;  Surgeon: Peter M Martinique, MD;  Location: [facility];  Service: Cardiovascular;  Laterality: N/A;  . TEE WITHOUT CARDIOVERSION N/A 04/29/2017   Procedure: TRANSESOPHAGEAL ECHOCARDIOGRAM (TEE);  Surgeon: [name], MD;  Location: [facility];  Service: Open Heart Surgery;  Laterality: N/A;    Assessment / Plan / Recommendation Clinical Impression   Jack Joseph is a 71 y.o. male with history of CAD s/pCABG, chronic systolic CHF s/p AVR '83- chronic coumadin, COPD who was admitted on 04/29/17 for redo sternotomy and repair of ascending aortic dilation/Wheat procedure.  Post op course complicated by VT with PEA arrest requiring CPR/ACLS protocol and intubation, R-PTX with large amount of subcutaneous emphysema right lateral chest, hypotension requiring pressors, severe left atelectasis with mucous plugging requiring reintubation and hypoxemia. Dr. Lovena Le consulted for input and anticipated ICD insertion prior to discharge to home.  He tolerated extubation on 7/1 and was started on dysphagia 3, nectar liquids on 7/3 due to trace aspiration of thins. MBS repeated on 7/10 showing no improvement and aspiration of thin liquids.   He continues to have issues with diarrhea requiring rectal tube but stools negative  for C diff. Lifevest ordered due to and LifeVest ordered as repeat echo with severe global reduction in LVF with diffuse hypokineses and EF 20-25%. Patient noted to be deconditioned with cognitive deficits affecting mobility and ability to carry out ADL tasks. Pacer wires removed today and patient cleared medically to start CIR program. Pt admited to CIR on 05/24/17.  Comprehensive cognitive-linguistic and bedside swallow evaluations completed on 05/25/17. 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 Pt with decreased speech intelligibility d/t significant SOB. Althought pt has received RMT evaluation during actue hospitalization, devices not attempted this day given significant SOB. Pt with resting O2 of 75% on O2 when SLP entered room. When cued for deep breathing pt able to maintain normal stats. Pt's speech intelligibility is ~ 50 to 75% at the simple phrase level d/t gasping. Pt ocnsumed nectar thick liquids without overt s/s of aspiration but full nursing supervision is recommended for frequent rest breaks and to monitor ability to consume PO safely with SOB. Pt requires skilled ST to address the above mentioned deficits, increased functional independence and reduce caregiver burden. Recommend pt receive follow-up HHST services at discharge.    Skilled Therapeutic Interventions          Skilled treatment session focused on completion of speech-language and bedside swallow evaluation, see above. ST requested full nursing supervision for PO intake d/t significant SOB. All questions answered to pt satisfaction. Education provided on energy and respiratory conservation while consuming PO intake. Will initiate RMT exercises at next available session if pt's respiratory status improves.    SLP Assessment  Patient will need skilled Speech Lanaguage Pathology Services during CIR admission    Recommendations  SLP Diet Recommendations: Age appropriate regular solids;Nectar Liquid Administration via: Cup Medication Administration: Whole meds with puree Supervision: Full supervision/cueing for compensatory strategies;Patient able to self feed (full supervision d/t SOB) Compensations: Slow rate;Small sips/bites (Rest breaks) Postural Changes and/or Swallow Maneuvers: Seated upright 90 degrees Oral Care  Recommendations: Oral care BID Recommendations for Other Services: Neuropsych consult Patient destination: Home Follow up Recommendations: Home Health SLP;Outpatient SLP Equipment Recommended: To be determined    SLP Frequency 3 to 5 out of 7 days   SLP Duration  SLP Intensity  SLP Treatment/Interventions 10 to 14 days  Minumum of 1-2 x/day, 30 to 90 minutes  Dysphagia/aspiration precaution training;Speech/Language facilitation;Patient/family education    Pain    Prior Functioning Cognitive/Linguistic Baseline: Within functional limits Type of Home: House  Lives With: Spouse Available Help at Discharge: Family;Available 24 hours/day;Neighbor Vocation: Full time employment  Function:  Eating Eating   Modified Consistency Diet: Yes Eating Assist Level: Supervision or verbal cues           Cognition Comprehension Comprehension assist level: Follows complex conversation/direction with extra time/assistive device  Expression   Expression assist level: Expresses complex ideas: With extra time/assistive device  Social Interaction Social Interaction assist level: Interacts appropriately with others - No medications needed.  Problem Solving Problem solving assist level: Solves complex problems: Recognizes & self-corrects  Memory Memory assist level: Complete Independence: No helper   Short Term Goals: Week 1: SLP Short Term Goal 1 (Week 1): Pt will consume current diet without overt s/s of aspiration and supervision cues for use of compensatory swallow strategies.  SLP Short Term Goal 2 (Week 1): Pt will consume trials of ice chips without overt s/s of aspiration to demonstrate readiness for instrumental study. SLP Short Term Goal 3 (  Week 1): Pt will use speech intelligibility strategies with Min A to increase vocal intensity to achieve ~ 75% intelligiblity at the sentence level.   Refer to Care Plan for Long Term Goals  Recommendations for other services:  Neuropsych  Discharge Criteria: Patient will be discharged from SLP if patient refuses treatment 3 consecutive times without medical reason, if treatment goals not met, if there is a change in medical status, if patient makes no progress towards goals or if patient is discharged from hospital.  The above assessment, treatment plan, treatment alternatives and goals were discussed and mutually agreed upon: by patient and by family  Kire Ferg 05/24/2017, 5:14 PM

## 2017-05-24 NOTE — Evaluation (Signed)
Physical Therapy Assessment and Plan  Patient Details  Name: Jack Joseph MRN: [id number] Date of Birth: 03-23-1946  PT Diagnosis: Difficulty walking and Muscle weakness Rehab Potential: Good ELOS: 10-14 days   Today's Date: 05/24/2017 PT Individual Time: 4098-1191 PT Individual Time Calculation (min): 75 min    Problem List:  Patient Active Problem List   Diagnosis Date Noted  . Acute blood loss anemia   . History of cardiac arrest   . Dysphagia   . Hypoalbuminemia due to protein-calorie malnutrition ([place])   . Pleural effusion   . Debility 05/23/2017  . Atelectasis   . Hyperglycemia   . Delirium   . Complication of chest tube   . Chest tube in place   . Acute encephalopathy   . Essential hypertension   . Dilatation of aorta (HCC)   . Postop check   . Acute respiratory failure with hypoxia ([place])   . VT (ventricular tachycardia) ([place])   . 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 Thoracic aortic aneurysm without rupture ([place]) 02/25/2017  . Mass of left lung 02/25/2017  . Acute on chronic systolic (congestive) heart failure ([place]) 02/25/2017  . CHF (congestive heart failure) ([place]) 07/10/2016  . Varicose veins of both lower extremities 07/10/2016  . Erectile dysfunction 07/10/2016  . AVD (aortic valve disease)   . Dilated cardiomyopathy ([place])   . Hyperlipidemia   . Chronic anticoagulation   . Hx of replacement of aortic valve 03/15/2011    Past Medical History:  Past Medical History:  Diagnosis Date  . AVD (aortic valve disease)   . Chronic anticoagulation   . Dilated cardiomyopathy (HCC)    EF 30-35%  . Dysrhythmia   . Hyperlipidemia   . LV dysfunction    Past Surgical History:  Past Surgical History:  Procedure Laterality Date  . AORTIC VALVE REPLACEMENT    . BENTALL PROCEDURE N/A 04/29/2017   Procedure: REPLACEMENT ASCENDING AORTA/ WHEAT PROCEDURE, HYPOTHERMIC CIRCULATORY ARREST AND CARDIOPULMONARY BYPASS, AND USE OF HEMASHIELD PLATINUM WOVEN  DOUBLE VELOUR VASCULAR GRAFT 32 MM X 50 CM;  Surgeon: [name], MD;  Location: [facility];  Service: Open Heart Surgery;  Laterality: N/A;  . DOPPLER ECHOCARDIOGRAPHY  04/07/2002   EF 30-35%  . RIGHT/LEFT HEART CATH AND CORONARY ANGIOGRAPHY N/A 03/05/2017   Procedure: Right/Left Heart Cath and Coronary Angiography;  Surgeon: Peter M Martinique, MD;  Location: [facility];  Service: Cardiovascular;  Laterality: N/A;  . TEE WITHOUT CARDIOVERSION N/A 04/29/2017   Procedure: TRANSESOPHAGEAL ECHOCARDIOGRAM (TEE);  Surgeon: [name], MD;  Location: [facility];  Service: Open Heart Surgery;  Laterality: N/A;    Assessment & Plan Clinical Impression: Patient is a 71 y.o. year old male with Jack Nyman Grooveris a 71 y.o.malewith history of CAD s/pCABG, chronic systolic CHF s/p AVR '83- chronic coumadin, COPD who was admitted on 04/29/17 for redo sternotomy and repair of ascending aortic dilation/Wheat procedure. Post op course complicated by VT with PEA arrest requiring CPR/ACLS protocol and intubation, R-PTX with large amount of subcutaneous emphysema right lateral chest, hypotension requiring pressors, severe left atelectasis with mucous plugging requiring reintubation and hypoxemia. Dr. Lovena Le consulted for input and anticipated ICD insertion prior to discharge to home. He tolerated extubation on 7/1 and was started on dysphagia 3, nectar liquids on 7/3 due to trace aspiration of thins. MBS repeated on 7/10 showing no improvement and aspiration of thin liquids. He continues to have issues with diarrhea requiring rectal tube but stools negative for  C diff. Lifevest ordered due to and LifeVest ordered as repeat echo with severe global reduction in LVF with diffuse hypokineses and EF 20-25%. Patient noted to be deconditioned with cognitive deficits affecting mobility and ability to carry out ADL tasks. Pacer wires removed today and patient cleared medically to start CIR program. Patient transferred  to CIR on 05/23/2017 .   Patient currently requires min with mobility secondary to muscle weakness, decreased cardiorespiratoy endurance and decreased oxygen support and decreased standing balance and decreased balance strategies.  Prior to hospitalization, patient was independent  with mobility and lived with Spouse in a House home.  Home access is 3-4Stairs to enter.  Patient will benefit from skilled PT intervention to maximize safe functional mobility, minimize fall risk and decrease caregiver burden for planned discharge home with intermittent assist.  Anticipate patient will benefit from follow up [facility] at discharge.  PT - End of Session Activity Tolerance: Decreased this session;Tolerates 10 - 20 min activity with multiple rests Endurance Deficit: Yes PT Assessment Rehab Potential (ACUTE/IP ONLY): Good PT Patient demonstrates impairments in the following area(s): Balance;Endurance;Motor;Pain;Safety PT Transfers Functional Problem(s): Bed Mobility;Bed to Chair;Car;Furniture;Floor PT Locomotion Functional Problem(s): Ambulation;Wheelchair Mobility;Stairs PT Plan PT Intensity: Minimum of 1-2 x/day ,45 to 90 minutes PT Frequency: 5 out of 7 days PT Duration Estimated Length of Stay: 10-14 days PT Treatment/Interventions: Ambulation/gait training;Discharge planning;Balance/vestibular training;Disease management/prevention;Functional mobility training;Patient/family education;Therapeutic Exercise;UE/LE Coordination activities;Therapeutic Activities;DME/adaptive equipment instruction;Neuromuscular re-education;Stair training;UE/LE Strength taining/ROM;Wheelchair propulsion/positioning PT Transfers Anticipated Outcome(s): supervision for transfers PT Locomotion Anticipated Outcome(s): supervision for transfers, min assist for stairs PT Recommendation Follow Up Recommendations: Home health PT Patient destination: Home Equipment Recommended: To be determined  Skilled Therapeutic Intervention Pt  supine in bed upon PT arrival, agreeable to therapy tx. Pt with increased work of breathing with conversation, difficulty talking. Pt on 2L of O2 throughout session. Pt reports that he feels tired and did not get any sleep last night.  Pts vitals monitored throughout session due to previous drops in SpO2 with getting out of bed and inconsistent HR ranging from 40bpm to 120 bpm. Talked to RN and she stated that she is already aware and so is the PA, recommend limiting activity and monitoring vitals and waiting on cardio consult. Pt performed hip flexion, straight leg raises and ankle pumps in bed 2 x 10 each. Pt transferred supine to sitting EOB with min assist and use of bed rails, SpO2 96% HR 95 sitting EOB, pt reported no symptoms but still increase work of breathing noted. Pt stood x 5 min with RW working on standing balance, SpO2 89% and HR 55. Pt ambulated 1 x 10 ft using RW and min assist for balance, SpO2 92% and HR 71bpm. Pt transferred from sitting EOB to supine with min assist. Pt left supine in bed with call bell in reach.   PT Evaluation Precautions/Restrictions Precautions Precautions: Fall;Sternal Precaution Comments: lifevest General   Vital SignsTherapy Vitals Temp: 97.9 F (36.6 C) Temp Source: Oral Pulse Rate: 93 Resp: 20 BP: (!) 101/56 Patient Position (if appropriate): Lying Oxygen Therapy SpO2: 98 % O2 Device: Nasal Cannula O2 Flow Rate (L/min): 2 L/min Pain  pt denies pain this session Home Living/Prior Functioning Home Living Available Help at Discharge: Family;Available 24 hours/day Type of Home: House Home Access: Stairs to enter [employer] of Steps: 3-4 Entrance Stairs-Rails: Can reach both;Right;Left (rails on back entrance, no rails on front steps) Home Layout: One level Additional Comments: worked 6 hours day, 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  Lives With: Spouse Prior Function Level of Independence: Independent with basic  ADLs;Independent with gait;Independent with transfers;Independent with homemaking with ambulation  Able to Take Stairs?: Yes Driving: Yes Vocation: Full time employment Leisure: Hobbies-yes (Comment) Comments: do yard work Hotel manager Status: Within Functional Limits for tasks assessed Arousal/Alertness: Awake/alert Orientation Level: Oriented X4 Attention: Selective Memory: Appears intact Awareness: Appears intact Problem Solving: Appears intact Safety/Judgment: Appears intact Sensation Sensation Light Touch: Appears Intact Stereognosis: Appears Intact Hot/Cold: Appears Intact Proprioception: Appears Intact Coordination Gross Motor Movements are Fluid and Coordinated: Yes Fine Motor Movements are Fluid and Coordinated: Yes Heel Shin Test: [facility] Motor  Motor Motor: Within Functional Limits  Trunk/Postural Assessment  Cervical Assessment Cervical Assessment: Within Functional Limits Thoracic Assessment Thoracic Assessment: Exceptions to [facility] (mild kyphosis) Lumbar Assessment Lumbar Assessment: Exceptions to [facility] (posterior pelvic tilt) Postural Control Postural Control: Within Functional Limits  Balance Balance Balance Assessed: Yes Static Sitting Balance Static Sitting - Level of Assistance: 5: Stand by assistance Dynamic Sitting Balance Dynamic Sitting - Level of Assistance: 4: Min assist Static Standing Balance Static Standing - Level of Assistance: 4: Min assist Dynamic Standing Balance Dynamic Standing - Level of Assistance: 4: Min assist Extremity Assessment  RLE Assessment RLE Assessment: Within Functional Limits (strength grossly 4/5, ROM WFL, generalized weakness) LLE Assessment LLE Assessment: Within Functional Limits (strength grossly 4/5, ROM WFL, generalized weakness)   See Function Navigator for Current Functional Status.   Refer to Care Plan for Long Term Goals  Recommendations for other services: None   Discharge Criteria:  Patient will be discharged from PT if patient refuses treatment 3 consecutive times without medical reason, if treatment goals not met, if there is a change in medical status, if patient makes no progress towards goals or if patient is discharged from hospital.  The above assessment, treatment plan, treatment alternatives and goals were discussed and mutually agreed upon: by patient  Netta Corrigan, PT, DPT 05/24/2017, 1:27 PM

## 2017-05-24 NOTE — Discharge Instructions (Addendum)
Inpatient Rehab Discharge Instructions  Jack Joseph Discharge date and time:  05/30/17  Activities/Precautions/ Functional Status: Activity: no lifting, driving, or strenuous exercise for till cleared by MD Diet: cardiac diet Wound Care: keep wound clean and dry. Contact MD if you develop any problems with your incision/wound--redness, swelling, increase in pain, drainage or if you develop fever or chills.   Functional status:  ___ No restrictions     ___ Walk up steps independently ___ 24/7 supervision/assistance   ___ Walk up steps with assistance _X__ Intermittent supervision/assistance  _X__ Bathe/dress independently _X__ Walk with walker    ___ Bathe/dress with assistance ___ Walk Independently    ___ Shower independently ___ Walk with assistance    ___ Shower with assistance _X__ No alcohol     ___ Return to work/school ________  Special Instructions: 1. Wear Life vest at all times. 2. Continue sternal precuations   COMMUNITY REFERRALS UPON DISCHARGE:    Home Health:   PT, OT, RN  Agency:[facility] Phone:[phone]   Date of last service:05/31/2017  Medical Equipment/Items Ordered:ROLLING [facility]  Agency/Supplier:[facility]   [phone]     My questions have been answered and I understand these instructions. I will adhere to these goals and the provided educational materials after my discharge from the hospital.  Patient/Caregiver Signature _______________________________ Date __________  Clinician Signature _______________________________________ Date __________  Please bring this form and your medication list with you to all your follow-up doctor's appointments.     Information on my medicine - Coumadin   (Warfarin)  This medication education was reviewed with me or my healthcare representative as part of my discharge preparation.   Why was Coumadin prescribed for you? Coumadin was prescribed for you because you have a blood clot or  a medical condition that can cause an increased risk of forming blood clots. Blood clots can cause serious health problems by blocking the flow of blood to the heart, lung, or brain. Coumadin can prevent harmful blood clots from forming. As a reminder your indication for Coumadin is:   Blood Clot Prevention After Heart Valve Surgery  What test will check on my response to Coumadin? While on Coumadin (warfarin) you will need to have an INR test regularly to ensure that your dose is keeping you in the desired range. The INR (international normalized ratio) number is calculated from the result of the laboratory test called prothrombin time (PT).  If an INR APPOINTMENT HAS NOT ALREADY BEEN MADE FOR YOU please schedule an appointment to have this lab work done by your health care provider within 7 days. Your INR goal is usually a number between:  2 to 3 or your provider may give you a more narrow range like 2-2.5.  Ask your health care provider during an office visit what your goal INR is.  What  do you need to  know  About  COUMADIN? Take Coumadin (warfarin) exactly as prescribed by your healthcare provider about the same time each day.  DO NOT stop taking without talking to the doctor who prescribed the medication.  Stopping without other blood clot prevention medication to take the place of Coumadin may increase your risk of developing a new clot or stroke.  Get refills before you run out.  What do you do if you miss a dose? If you miss a dose, take it as soon as you remember on the same day then continue your regularly scheduled regimen the next day.  Do not take two  doses of Coumadin at the same time.  Important Safety Information A possible side effect of Coumadin (Warfarin) is an increased risk of bleeding. You should call your healthcare provider right away if you experience any of the following: ? Bleeding from an injury or your nose that does not stop. ? Unusual colored urine (red or dark  brown) or unusual colored stools (red or black). ? Unusual bruising for unknown reasons. ? A serious fall or if you hit your head (even if there is no bleeding).  Some foods or medicines interact with Coumadin (warfarin) and might alter your response to warfarin. To help avoid this: ? Eat a balanced diet, maintaining a consistent amount of Vitamin K. ? Notify your provider about major diet changes you plan to make. ? Avoid alcohol or limit your intake to 1 drink for women and 2 drinks for men per day. (1 drink is 5 oz. wine, 12 oz. beer, or 1.5 oz. liquor.)  Make sure that ANY health care provider who prescribes medication for you knows that you are taking Coumadin (warfarin).  Also make sure the healthcare provider who is monitoring your Coumadin knows when you have started a new medication including herbals and non-prescription products.  Coumadin (Warfarin)  Major Drug Interactions  Increased Warfarin Effect Decreased Warfarin Effect  Alcohol (large quantities) Antibiotics (esp. Septra/Bactrim, Flagyl, Cipro) Amiodarone (Cordarone) Aspirin (ASA) Cimetidine (Tagamet) Megestrol (Megace) NSAIDs (ibuprofen, naproxen, etc.) Piroxicam (Feldene) Propafenone (Rythmol SR) Propranolol (Inderal) Isoniazid (INH) Posaconazole (Noxafil) Barbiturates (Phenobarbital) Carbamazepine (Tegretol) Chlordiazepoxide (Librium) Cholestyramine (Questran) Griseofulvin Oral Contraceptives Rifampin Sucralfate (Carafate) Vitamin K   Coumadin (Warfarin) Major Herbal Interactions  Increased Warfarin Effect Decreased Warfarin Effect  Garlic Ginseng Ginkgo biloba Coenzyme Q10 Green tea St. Johns wort    Coumadin (Warfarin) FOOD Interactions  Eat a consistent number of servings per week of foods HIGH in Vitamin K (1 serving =  cup)  Collards (cooked, or boiled & drained) Kale (cooked, or boiled & drained) Mustard greens (cooked, or boiled & drained) Parsley *serving size only =  cup Spinach  (cooked, or boiled & drained) Swiss chard (cooked, or boiled & drained) Turnip greens (cooked, or boiled & drained)  Eat a consistent number of servings per week of foods MEDIUM-HIGH in Vitamin K (1 serving = 1 cup)  Asparagus (cooked, or boiled & drained) Broccoli (cooked, boiled & drained, or raw & chopped) Brussel sprouts (cooked, or boiled & drained) *serving size only =  cup Lettuce, raw (green leaf, endive, romaine) Spinach, raw Turnip greens, raw & chopped   These websites have more information on Coumadin (warfarin):  http://www.king-russell.com/; https://www.hines.net/;

## 2017-05-24 NOTE — Progress Notes (Signed)
Patient ID: Jack Joseph, male   DOB: 09/26/1946, 71 y.o.   MRN: [id number]      [address]       [place] [address]             [phone]                     LOS: 1 day   Subjective: Says had trouble sleeping, sob sob with exertion   Objective: Vital signs in last 24 hours: Patient Vitals for the past 24 hrs:  BP Temp Temp src Pulse Resp SpO2  05/24/17 1357 - - - - - 94 %  05/24/17 1300 (!) 101/56 97.9 F (36.6 C) Oral 93 20 98 %  05/24/17 0938 - - - - - 94 %  05/24/17 0555 108/62 97.7 F (36.5 C) Oral 91 (!) 22 94 %  05/23/17 2014 - - - - - 94 %    There were no vitals filed for this visit.  Hemodynamic parameters for last 24 hours:    Intake/Output from previous day: 07/12 0701 - 07/13 0700 In: 10 [I.V.:10] Out: 850 [Urine:850] Intake/Output this shift: Total I/O In: 320 [P.O.:320] Out: -   Scheduled Meds: . amiodarone  200 mg Oral Daily  . feeding supplement (PRO-STAT SUGAR FREE 64)  30 mL Oral BID  . levalbuterol  0.63 mg Nebulization TID  . magic mouthwash  5 mL Oral TID  . pantoprazole  40 mg Oral Daily  . potassium chloride  20 mEq Oral Daily  . sodium chloride flush  10-40 mL Intracatheter Q12H  . warfarin  2.5 mg Oral ONCE-1800  . Warfarin - Pharmacist Dosing Inpatient   Does not apply q1800   Continuous Infusions: PRN Meds:.acetaminophen, alum & mag hydroxide-simeth, bisacodyl, diphenhydrAMINE, guaiFENesin-dextromethorphan, levalbuterol, polyethylene glycol, prochlorperazine **OR** prochlorperazine **OR** prochlorperazine, RESOURCE THICKENUP CLEAR, sodium chloride flush, sodium phosphate, traZODone  General appearance: alert and cooperative Neurologic: intact Heart: regular rate and rhythm, S1, S2 normal, no murmur, click, rub or gallop Lungs: diminished breath sounds LLL Abdomen: soft, non-tender; bowel sounds normal; no masses,  no organomegaly Extremities: extremities normal, atraumatic, no cyanosis or edema and  Homans sign is negative, no sign of DVT Wound: sternum stable  Lab Results: CBC:  Recent Labs  05/23/17 0353 05/24/17 0500  WBC 11.0* 9.1  HGB 10.1* 9.5*  HCT 32.0* 31.3*  PLT 511* 462*   BMET:   Recent Labs  05/23/17 0353 05/24/17 0500  NA 137 137  K 4.1 3.9  CL 100* 100*  CO2 30 32  GLUCOSE 137* 103*  BUN 10 8  CREATININE 0.75 0.65  CALCIUM 8.2* 8.1*    PT/INR:   Recent Labs  05/24/17 0500  LABPROT 28.2*  INR 2.58     Radiology Dg Chest 2 View  Result Date: 05/23/2017 CLINICAL DATA:  Postop aortic valve replacement. EXAM: CHEST  2 VIEW COMPARISON:  05/20/2017 and previous FINDINGS: More pleural fluid on the left, moderate in size, layering dependently, with left lower lobe atelectasis. Smaller effusion on the right with mild right base volume loss. 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 Aortic valve replacement appears the same. Right arm PICC tip in the SVC just above the right atrium. IMPRESSION: Moderate size left effusion with left lower lobe collapse. Smaller right effusion with mild right base volume loss. Electronically Signed   By: Paulina Fusi M.D.   On: 05/23/2017 07:40  Assessment/Plan: Stable on rehab Some pulmonary congestion, will add lasix to meds   [name] MD 05/24/2017 3:36 PM

## 2017-05-24 NOTE — Evaluation (Addendum)
Occupational Therapy Assessment and Plan  Patient Details  Name: Jack Joseph MRN: [id number] Date of Birth: 11/29/1945  OT Diagnosis: muscle weakness (generalized) and coordination disorder Rehab Potential: Rehab Potential (ACUTE ONLY): Fair ELOS: 11-13 days   Today's Date: 05/24/2017 OT Individual Time: 1102-1212 OT Individual Time Calculation (min): 70 min     Problem List:  Patient Active Problem List   Diagnosis Date Noted  . Acute blood loss anemia   . History of cardiac arrest   . Dysphagia   . Hypoalbuminemia due to protein-calorie malnutrition ([place])   . Pleural effusion   . Debility 05/23/2017  . Atelectasis   . Hyperglycemia   . Delirium   . Complication of chest tube   . Chest tube in place   . Acute encephalopathy   . Essential hypertension   . Dilatation of aorta (HCC)   . Postop check   . Acute respiratory failure with hypoxia ([place])   . VT (ventricular tachycardia) ([place])   . 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 Thoracic aortic aneurysm without rupture ([place]) 02/25/2017  . Mass of left lung 02/25/2017  . Acute on chronic systolic (congestive) heart failure ([place]) 02/25/2017  . CHF (congestive heart failure) ([place]) 07/10/2016  . Varicose veins of both lower extremities 07/10/2016  . Erectile dysfunction 07/10/2016  . AVD (aortic valve disease)   . Dilated cardiomyopathy ([place])   . Hyperlipidemia   . Chronic anticoagulation   . Hx of replacement of aortic valve 03/15/2011    Past Medical History:  Past Medical History:  Diagnosis Date  . AVD (aortic valve disease)   . Chronic anticoagulation   . Dilated cardiomyopathy (HCC)    EF 30-35%  . Dysrhythmia   . Hyperlipidemia   . LV dysfunction    Past Surgical History:  Past Surgical History:  Procedure Laterality Date  . AORTIC VALVE REPLACEMENT    . BENTALL PROCEDURE N/A 04/29/2017   Procedure: REPLACEMENT ASCENDING AORTA/ WHEAT PROCEDURE, HYPOTHERMIC CIRCULATORY ARREST AND  CARDIOPULMONARY BYPASS, AND USE OF HEMASHIELD PLATINUM WOVEN DOUBLE VELOUR VASCULAR GRAFT 32 MM X 50 CM;  Surgeon: [name], MD;  Location: [facility];  Service: Open Heart Surgery;  Laterality: N/A;  . DOPPLER ECHOCARDIOGRAPHY  04/07/2002   EF 30-35%  . RIGHT/LEFT HEART CATH AND CORONARY ANGIOGRAPHY N/A 03/05/2017   Procedure: Right/Left Heart Cath and Coronary Angiography;  Surgeon: Peter M Martinique, MD;  Location: [facility];  Service: Cardiovascular;  Laterality: N/A;  . TEE WITHOUT CARDIOVERSION N/A 04/29/2017   Procedure: TRANSESOPHAGEAL ECHOCARDIOGRAM (TEE);  Surgeon: [name], MD;  Location: [facility];  Service: Open Heart Surgery;  Laterality: N/A;    Assessment & Plan Clinical Impression:  Patient currently requires min with basic self-care skills secondary to muscle weakness, decreased cardiorespiratoy endurance and decreased sitting balance, decreased standing balance, decreased postural control, decreased balance strategies and difficulty maintaining precautions.  Prior to hospitalization, patient could complete BADLs with independent .  Patient will benefit from skilled intervention to increase independence with basic self-care skills prior to discharge home in care of spouse, 24/7 supervision provided by spouse, daughter, and neighbors.  Anticipate patient will require 24 hour supervision and follow up home health.  OT - End of Session Endurance Deficit: Yes OT Assessment Rehab Potential (ACUTE ONLY): Fair OT Barriers to Discharge: Medical stability OT Patient demonstrates impairments in the following area(s): Safety;Balance;Endurance;Motor;Skin Integrity OT Basic ADL's Functional Problem(s): Grooming;Bathing;Dressing;Toileting OT Advanced ADL's Functional Problem(s): Simple Meal Preparation OT Transfers  Functional Problem(s): Toilet;Tub/Shower OT Plan OT Intensity: Minimum of 1-2 x/day, 45 to 90 minutes OT Frequency: 5 out of 7 days OT Duration/Estimated  Length of Stay: 11-13 days OT Treatment/Interventions: Balance/vestibular training;Discharge planning;Self Care/advanced ADL retraining;Therapeutic Activities;UE/LE Coordination activities;Functional mobility training;Patient/family education;Therapeutic Exercise;DME/adaptive equipment instruction;Psychosocial support;UE/LE Strength taining/ROM OT Self Feeding Anticipated Outcome(s): N/A OT Basic Self-Care Anticipated Outcome(s): Supervision OT Toileting Anticipated Outcome(s): Supervision OT Bathroom Transfers Anticipated Outcome(s): Supervision  OT Recommendation Recommendations for Other Services: Therapeutic Recreation consult Therapeutic Recreation Interventions: Pet therapy Patient destination: Home Follow Up Recommendations: Home health OT Equipment Recommended: To be determined;Tub/shower bench;3 in 1 bedside comode   Skilled Therapeutic Intervention Skilled OT session completed with focus on initial evaluation, education on OT role/POC, adherence to precautions, and establishment of patient-centered goals.   Pt greeted supine in bed. Per RN, pt having difficultly maintaining stable 02/HR at rest and EOB. Therefore tx completed at bedside for safety with vitals closely monitored. Pt transitioning to EOB with min guard and cues to maintain sternal precautions. Pt bathing with overall Min A while seated (pericare excluded per pt request). LifeVest doffed for washing back. Noticed red marks on skin from vest. Notified PA and RN. UB/LB dressing completed at sit<stand level with Min A. Educated him on diaphragmatic breathing and had him take rest breaks during ADL. Pt reporting need to void. Stand pivot<BSC with Min A, pt pushing up unilaterally and using other UE to mgt his vest monitor. Assist provided for clothing mgt and hygiene thoroughness post BM. Pt then returned to bed, visibly fatigued, transitioned to supine. 2 helpers for boosting up in bed. Pt left with RN due to continued LifeVest  chiming after vest had been adjusted by therapist x3.  Throughout tx, vitals erratic: HR 30-126BPM, 02 sats on supplemental 02 55-96%. Pt asymptomatic during all reads. Exhibited SOB but no signs of respiratory distress. RN made aware.    OT Evaluation Precautions/Restrictions  Precautions Precautions: Fall;Sternal Precaution Comments: lifevest Restrictions Weight Bearing Restrictions: Yes General Chart Reviewed: Yes Family/Caregiver Present: No Vital Signs Therapy Vitals Temp: 97.9 F (36.6 C) Temp Source: Oral Pulse Rate: 93 Resp: 20 BP: (!) 101/56 Patient Position (if appropriate): Lying Oxygen Therapy SpO2: 94 % O2 Device: Nasal Cannula O2 Flow Rate (L/min): 2 L/min Pain   Home Living/Prior Functioning Home Living Available Help at Discharge: Family, Available 24 hours/day, Neighbor Type of Home: House Home Access: Stairs to enter Technical brewer of Steps: 3-4 Entrance Stairs-Rails: Can reach both, Right, Left (rails on back entrance, no rails on front steps) Home Layout: One level Bathroom Shower/Tub: Chiropodist: Standard Additional Comments: worked 6 hours day, 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  Lives With: Spouse IADL History Homemaking Responsibilities: No Occupation: Part time employment Type of Occupation: Worked 6 hours a day, delivering auto parts to [employer] and Hobbies: Doing yard work  Prior Function Level of Independence: Independent with basic ADLs, Independent with gait, Independent with transfers, Independent with homemaking with ambulation  Able to Take Stairs?: Yes Driving: Yes Vocation: Full time employment Leisure: Hobbies-yes (Comment) Comments: do yard work ADL ADL ADL Comments: Please see functional navigator for ADL status Vision Baseline Vision/History: No visual deficits Patient Visual Report: No change from baseline Vision Assessment?: No apparent visual deficits Perception   Perception: Within Functional Limits Praxis Praxis: Intact Cognition Overall Cognitive Status: Within Functional Limits for tasks assessed Arousal/Alertness: Awake/alert Orientation Level: Person;Place;Situation Person: Oriented Place: Oriented Situation: Oriented Year: 2018 Month: July Day  of Week: Incorrect Memory: Impaired Memory Impairment: Decreased recall of new information Immediate Memory Recall: Blue;Bed Attention: Sustained Sustained Attention: Appears intact Awareness: Appears intact Problem Solving: Appears intact Safety/Judgment: Appears intact Sensation Sensation Light Touch: Appears Intact Stereognosis: Not tested Hot/Cold: Appears Intact Proprioception: Appears Intact Coordination Gross Motor Movements are Fluid and Coordinated: No Fine Motor Movements are Fluid and Coordinated: No (assist required for fastening Lifevest) Coordination and Movement Description: Affected by fatigue and strength deficits  Heel Shin Test: [facility] Motor  Motor Motor: Other (comment) Motor - Skilled Clinical Observations: affected by global weakness, endurance deficits, and sternal precaution limitations  Mobility  Transfers Transfers: Sit to Stand;Stand to Sit Sit to Stand: 4: Min assist Sit to Stand Details: Verbal cues for technique;Verbal cues for precautions/safety Stand to Sit: 4: Min assist Stand to Sit Details (indicate cue type and reason): Verbal cues for precautions/safety;Verbal cues for technique  Trunk/Postural Assessment  Cervical Assessment Cervical Assessment: Within Functional Limits Thoracic Assessment Thoracic Assessment: Exceptions to [facility] (kyphotic) Lumbar Assessment Lumbar Assessment: Exceptions to [facility] (posterior pelvic tilt) Postural Control Postural Control: Within Functional Limits  Balance Balance Balance Assessed: Yes Static Sitting Balance Static Sitting - Level of Assistance: 5: Stand by assistance Dynamic Sitting Balance Dynamic Sitting -  Level of Assistance: 5: Stand by assistance (bathing LB at EOB) Static Standing Balance Static Standing - Level of Assistance: 4: Min assist Dynamic Standing Balance Dynamic Standing - Level of Assistance: 4: Min assist (clothing mgt post toileting; while completing LB dressing) Extremity/Trunk Assessment RUE Assessment RUE Assessment: Within Functional Limits (MMT contraindicated due to sternal precautions) LUE Assessment LUE Assessment: Within Functional Limits (MMT contraindicated due to sternal precautions)   See Function Navigator for Current Functional Status.   Refer to Care Plan for Long Term Goals  Recommendations for other services: Therapeutic Recreation  Pet therapy   Discharge Criteria: Patient will be discharged from OT if patient refuses treatment 3 consecutive times without medical reason, if treatment goals not met, if there is a change in medical status, if patient makes no progress towards goals or if patient is discharged from hospital.  The above assessment, treatment plan, treatment alternatives and goals were discussed and mutually agreed upon: by patient  Skeet Simmer 05/24/2017, 4:31 PM

## 2017-05-24 NOTE — Progress Notes (Signed)
Discussed patient with cardiology PA who noted that Dr. Tyrone Sage had seen patient and concurred with use of lasix as first line. She did recommend changing it to IV route and they will follow up in am with other recommendations.

## 2017-05-24 NOTE — Progress Notes (Addendum)
[facility] PHYSICAL MEDICINE & REHABILITATION     PROGRESS NOTE  Subjective/Complaints:  Pt seen laying in bed this Am.  He slept fairly overnight.  He is ready to begin therapies.  ROS: +SOB. Denies CP, N/V/D  Objective: Vital Signs: Blood pressure 108/62, pulse 91, temperature 97.7 F (36.5 C), temperature source Oral, resp. rate (!) 22, SpO2 94 %. Dg Chest 2 View  Result Date: 05/23/2017 CLINICAL DATA:  Postop aortic valve replacement. EXAM: CHEST  2 VIEW COMPARISON:  05/20/2017 and previous FINDINGS: More pleural fluid on the left, moderate in size, layering dependently, with left lower lobe atelectasis. Smaller effusion on the right with mild right base volume loss. 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 Aortic valve replacement appears the same. Right arm PICC tip in the SVC just above the right atrium. IMPRESSION: Moderate size left effusion with left lower lobe collapse. Smaller right effusion with mild right base volume loss. Electronically Signed   By: Paulina Fusi M.D.   On: 05/23/2017 07:40    Recent Labs  05/23/17 0353 05/24/17 0500  WBC 11.0* 9.1  HGB 10.1* 9.5*  HCT 32.0* 31.3*  PLT 511* 462*    Recent Labs  05/23/17 0353 05/24/17 0500  NA 137 137  K 4.1 3.9  CL 100* 100*  GLUCOSE 137* 103*  BUN 10 8  CREATININE 0.75 0.65  CALCIUM 8.2* 8.1*   CBG (last 3)  No results for input(s): GLUCAP in the last 72 hours.  Wt Readings from Last 3 Encounters:  05/23/17 65.3 kg (143 lb 15.4 oz)  04/25/17 78.5 kg (173 lb)  04/25/17 64.9 kg (143 lb)    Physical Exam:  BP 108/62 (BP Location: Left Arm)   Pulse 91   Temp 97.7 F (36.5 C) (Oral)   Resp (!) 22   SpO2 94%  Constitutional: He appears well-developed. Frail.  HENT: Normocephalicand atraumatic.  Eyes: EOMI. No discharge.  Cardiovascular: Normal rateand regular rhythm. Murmurheard. Respiratory: Increased WOB. Decreased breath sounds at bases.  +[place] GI: Soft. Bowel sounds are normal.   Musculoskeletal: He exhibits no edemaor tenderness.  Neurological: He is alertand oriented x3.  Dysphonia noted.  Able to follow basic commands without difficulty. Moves all four equally.  Motor: 4+/5 throughout Skin: Sternal incision clean, dry and intact.  Assessment/Plan: 1. Functional deficits secondary to debility which require 3+ hours per day of interdisciplinary therapy in a comprehensive inpatient rehab setting. Physiatrist is providing close team supervision and 24 hour management of active medical problems listed below. Physiatrist and rehab team continue to assess barriers to discharge/monitor patient progress toward functional and medical goals.  Function:  Bathing Bathing position      Bathing parts      Bathing assist        Upper Body Dressing/Undressing Upper body dressing                    Upper body assist        Lower Body Dressing/Undressing Lower body dressing                                  Lower body assist        Toileting Toileting          Toileting assist     Transfers Chair/bed transfer             Locomotion  Ambulation           Wheelchair          Cognition Comprehension    Expression    Social Interaction    Problem Solving    Memory      Medical Problem List and Plan: 1. Deconditioning secondary to aortic root repair with postoperative cardiac arrest  Begin CIR  Notes reviewed, images reviewed 2. Replacement of Ascending aorta/Wheat procedure/DVT Prophylaxis/Anticoagulation: Pharmaceutical: Coumadin  INR therapeutic on 7/13 3. Pain Management: tylenol prn 4. Mood: LCSW to follow for evaluation and support.  5. Neuropsych: This patient is ?fullycapable of making decisions on hisown behalf. 6. Skin/Wound Care: routine pressure relief measures.  7. Fluids/Electrolytes/Nutrition: Strict I/Os. Check daily weights.   BMP within acceptable  range on 7/13 8.Chronic systolic CHF with Hypotension: No BB due to advanced HF/low EF and No ACE/ARB due to labile BP. There were no vitals filed for this visit. 9. Abnormal LFTs: Resolved  will continue to monitor.No statins.  10. Leucocytosis: Resolved  Monitor for signs of infections. Diarrhea resolved. 11. ABLA:   Hb 9.5 on 7/13  Labs ordered for Monday  Cont to monitor  Hemoccult ordered 12. VT arrest s/p DCCV X 3 t: On amiodarone 200 mg daily. Continue to monitor LFTs. Left Vest in place.  13. Dysphagia: continue dysphagia 3, nectar liquids with aspiration precautions.   Advance diet as tolerated 14. Hypoxia: Continues to be limited by DOE/Hypoxia. Continue nebs tid. Encourage IS while awake  Wean supplemental O2 as tolerated  CXR 7/12 reviewed, b/l pleural effusions 15. Hypoalbuminemia  Supplement initiated 7/13  LOS (Days) 1 A FACE TO FACE EVALUATION WAS PERFORMED  Ankit Karis Juba 05/24/2017 7:48 AM

## 2017-05-24 NOTE — IPOC Note (Signed)
Overall Plan of Care [facility]) Patient Details Name: Jack Joseph MRN: [id number] DOB: [birth year]  Admitting Diagnosis: Debility after Cardiac Surgery  Hospital Problems: Active Problems:   Debility   Acute blood loss anemia   History of cardiac arrest   Dysphagia   Hypoalbuminemia due to protein-calorie malnutrition (HCC)   Pleural effusion     Functional Problem List: Nursing Edema, Endurance, Medication Management, Nutrition, Pain, Perception, Safety, Sensory, Skin Integrity  PT Balance, Endurance, Motor, Pain, Safety  OT Safety, Balance, Endurance, Motor, Skin Integrity  SLP Nutrition  TR         Basic ADL's: OT Grooming, Bathing, Dressing, Toileting     Advanced  ADL's: OT Simple Meal Preparation     Transfers: PT Bed Mobility, Bed to Chair, Car, Furniture, Civil Service fast streamer, Research scientist (life sciences): PT Ambulation, Psychologist, prison and probation services, Stairs     Additional Impairments: OT    SLP Swallowing, Communication expression    TR      Anticipated Outcomes Item Anticipated Outcome  Self Feeding N/A  Swallowing  Mod I with least restrictive diet   Basic self-care  Supervision  Toileting  Supervision   Bathroom Transfers Supervision   Bowel/Bladder  min assist  Transfers  supervision for transfers  Locomotion  supervision for transfers, min assist for stairs  Communication  Mod I at simple conversation level  Cognition     Pain  less<3  Safety/Judgment  min assist   Therapy Plan: PT Intensity: Minimum of 1-2 x/day ,45 to 90 minutes PT Frequency: 5 out of 7 days PT Duration Estimated Length of Stay: 10-14 days OT Intensity: Minimum of 1-2 x/day, 45 to 90 minutes OT Frequency: 5 out of 7 days OT Duration/Estimated Length of Stay: 11-13 days SLP Intensity: Minumum of 1-2 x/day, 30 to 90 minutes SLP Frequency: 3 to 5 out of 7 days SLP Duration/Estimated Length of Stay: 10 to 14 days       Team Interventions: Nursing Interventions  Patient/Family Education, Disease Management/Prevention, Pain Management, Medication Management, Skin Care/Wound Management, Cognitive Remediation/Compensation, Dysphagia/Aspiration Precaution Training, Discharge Planning  PT interventions Ambulation/gait training, Discharge planning, Balance/vestibular training, Disease management/prevention, Functional mobility training, Patient/family education, Therapeutic Exercise, UE/LE Coordination activities, Therapeutic Activities, DME/adaptive equipment instruction, Neuromuscular re-education, Stair training, UE/LE Strength taining/ROM, Wheelchair propulsion/positioning  OT Interventions Balance/vestibular training, Discharge planning, Self Care/advanced ADL retraining, Therapeutic Activities, UE/LE Coordination activities, Functional mobility training, Patient/family education, Therapeutic Exercise, DME/adaptive equipment instruction, Psychosocial support, UE/LE Strength taining/ROM  SLP Interventions Dysphagia/aspiration precaution training, Speech/Language facilitation, Patient/family education  TR Interventions    SW/CM Interventions Discharge Planning, Psychosocial Support, Patient/Family Education    Team Discharge Planning: Destination: PT-Home ,OT- Home , SLP-Home Projected Follow-up: PT-Home health PT, OT-  Home health OT, SLP-Home Health SLP, Outpatient SLP Projected Equipment Needs: PT-To be determined, OT- To be determined, Tub/shower bench, 3 in 1 bedside comode, SLP-To be determined Equipment Details: PT- , OT-  Patient/family involved in discharge planning: PT- Patient,  OT-Patient, SLP-Patient  MD ELOS: 14-17 days. Medical Rehab Prognosis:  Good Assessment: 71 y.o.malewith history of CAD s/pCABG, chronic systolic CHF s/p AVR '83- chronic coumadin, COPD who was admitted on 04/29/17 for redo sternotomy and repair of ascending aortic dilation/Wheat procedure. Post op course complicated by VT with PEA arrest requiring CPR/ACLS protocol and  intubation, R-PTX with large amount of subcutaneous emphysema right lateral chest, hypotension requiring pressors, severe left atelectasis with mucous plugging requiring reintubation and hypoxemia. Dr. Ladona Ridgel consulted for input and anticipated  ICD insertion prior to discharge to home. He tolerated extubation on 7/1 and was started on dysphagia 3, nectar liquids on 7/3 due to trace aspiration of thins. MBS repeated on 7/10 showing no improvement and aspiration of thin liquids. He continues to have issues with diarrhea but stools negative for C diff. LifeVest ordered as repeat echo with severe global reduction in LVF with diffuse hypokineses and EF 20-25%. Patient noted to be deconditioned with cognitive deficits affecting mobility and ability to carry out ADL tasks. Will set goals for Supervision with PT/OT/SLP.   See Team Conference Notes for weekly updates to the plan of care

## 2017-05-24 NOTE — Progress Notes (Signed)
Social Work Patient ID: Jack Joseph, male   DOB: 1946-05-16, 71 y.o.   MRN: [id number]  Attempted to complete psychosocial assessment due to SOB and inability to catch his breath. Pam-PA and Alena-RN aware Pam reports cardiology has been called to come and see pt. Therapy team attempting to do therapies from bedside.

## 2017-05-24 NOTE — Progress Notes (Signed)
ANTICOAGULATION CONSULT NOTE - Initial Consult  Pharmacy Consult for Warfarin per MD Indication: mechanical aortic valve  Allergies  Allergen Reactions  . Morphine And Related Itching  . No Known Allergies     Patient Measurements:    Vital Signs: Temp: 97.9 F (36.6 C) (07/13 1300) Temp Source: Oral (07/13 1300) BP: 101/56 (07/13 1300) Pulse Rate: 93 (07/13 1300)  Labs:  Recent Labs  05/22/17 0456 05/22/17 1617 05/23/17 0353 05/24/17 0500  HGB  --  10.9* 10.1* 9.5*  HCT  --  32.0* 32.0* 31.3*  PLT  --   --  511* 462*  LABPROT 31.5*  --  27.5* 28.2*  INR 2.96  --  2.51 2.58  CREATININE  --  0.60* 0.75 0.65    Estimated Creatinine Clearance: 77.1 mL/min (by C-G formula based on SCr of 0.65 mg/dL).   Medical History: Past Medical History:  Diagnosis Date  . AVD (aortic valve disease)   . Chronic anticoagulation   . Dilated cardiomyopathy (HCC)    EF 30-35%  . Dysrhythmia   . Hyperlipidemia   . LV dysfunction     Assessment: 71 yo male s/p ascending aorta replacement with history of mech aortic valve (goal per [facility] clinic 2.5-3.5) Pt  transferred  to rehab 7/12 and pharmacy consulted to dose coumadin INR 2.58, therapeutic  home dose: 5mg  daily except 2.5mg  on Mondays and Thursdays   Goal of Therapy:  INR 2.5-3.5 Monitor platelets by anticoagulation protocol: Yes   Plan:  Warfarin 2.5 mg po x 1 Daily INR  Bayard Hugger, PharmD, BCPS  Clinical Pharmacist  Pager: [phone]    05/24/2017 1:10 PM

## 2017-05-24 NOTE — Progress Notes (Signed)
Patient information reviewed and entered into eRehab system by Shonette Rhames, RN, CRRN, PPS Coordinator.  Information including medical coding and functional independence measure will be reviewed and updated through discharge.     Per nursing patient was given "Data Collection Information Summary for Patients in Inpatient Rehabilitation Facilities with attached "Privacy Act Statement-Health Care Records" upon admission.  

## 2017-05-25 ENCOUNTER — Inpatient Hospital Stay (HOSPITAL_COMMUNITY): Payer: Medicare Other

## 2017-05-25 ENCOUNTER — Inpatient Hospital Stay (HOSPITAL_COMMUNITY): Payer: Medicare Other | Admitting: Occupational Therapy

## 2017-05-25 ENCOUNTER — Inpatient Hospital Stay (HOSPITAL_COMMUNITY): Payer: Medicare Other | Admitting: Speech Pathology

## 2017-05-25 ENCOUNTER — Inpatient Hospital Stay (HOSPITAL_COMMUNITY): Payer: Medicare Other | Admitting: Physical Therapy

## 2017-05-25 LAB — BASIC METABOLIC PANEL
Anion gap: 5 (ref 5–15)
BUN: 11 mg/dL (ref 6–20)
CALCIUM: 8.3 mg/dL — AB (ref 8.9–10.3)
CO2: 34 mmol/L — AB (ref 22–32)
Chloride: 96 mmol/L — ABNORMAL LOW (ref 101–111)
Creatinine, Ser: 0.73 mg/dL (ref 0.61–1.24)
GFR calc Af Amer: >60 mL/min (ref >60)
Glucose, Bld: 101 mg/dL — ABNORMAL HIGH (ref 65–99)
Potassium: 4 mmol/L (ref 3.5–5.1)
SODIUM: 135 mmol/L (ref 135–145)

## 2017-05-25 LAB — PROTIME-INR
INR: 3.38
Prothrombin Time: 34.9 seconds — ABNORMAL HIGH (ref 11.4–15.2)

## 2017-05-25 MED ORDER — COLCHICINE 0.6 MG PO TABS
0.6000 mg | ORAL_TABLET | Freq: Four times a day (QID) | ORAL | Status: AC
  Administered 2017-05-25 (×2): 0.6 mg via ORAL
  Filled 2017-05-25 (×2): qty 1

## 2017-05-25 NOTE — Progress Notes (Signed)
ANTICOAGULATION CONSULT NOTE - Initial Consult  Pharmacy Consult for Warfarin Indication: mechanical aortic valve  Allergies  Allergen Reactions  . Morphine And Related Itching  . No Known Allergies     Patient Measurements: Weight: 144 lb 10 oz (65.6 kg)  Vital Signs: Temp: 97.5 F (36.4 C) (07/14 1300) Temp Source: Oral (07/14 1300) BP: 87/57 (07/14 1300) Pulse Rate: 107 (07/14 1300)  Labs:  Recent Labs  05/22/17 1617 05/23/17 0353 05/24/17 0500 05/25/17 0453  HGB 10.9* 10.1* 9.5*  --   HCT 32.0* 32.0* 31.3*  --   PLT  --  511* 462*  --   LABPROT  --  27.5* 28.2* 34.9*  INR  --  2.51 2.58 3.38  CREATININE 0.60* 0.75 0.65 0.73    Estimated Creatinine Clearance: 77.4 mL/min (by C-G formula based on SCr of 0.73 mg/dL).   Medical History: Past Medical History:  Diagnosis Date  . AVD (aortic valve disease)   . Chronic anticoagulation   . Dilated cardiomyopathy (HCC)    EF 30-35%  . Dysrhythmia   . Hyperlipidemia   . LV dysfunction     Assessment: 71 yo male s/p ascending aorta replacement with history of mech aortic valve (goal per [facility] clinic 2.5-3.5) Pt  transferred  to rehab 7/12 and pharmacy consulted to dose coumadin INR up to 3.3 today, which is therapeutic but with a large increase from yesterday.  home dose: 5mg  daily except 2.5mg  on Mondays and Thursdays   Goal of Therapy:  INR 2.5-3.5 Monitor platelets by anticoagulation protocol: Yes   Plan:  Decrease Coumadin to 1mg  today Daily PT/INR  Estella Husk, Pharm.D., BCPS, AAHIVP Clinical Pharmacist Phone: [phone] 05/25/2017, 2:51 PM

## 2017-05-25 NOTE — Progress Notes (Signed)
Physical Therapy Session Note  Patient Details  Name: Jack Joseph MRN: [id number] Date of Birth: 1946-03-15  Today's Date: 05/25/2017 PT Individual Time: 1115-1205 PT Individual Time Calculation (min): 50 min   Short Term Goals: Week 1:  PT Short Term Goal 1 (Week 1): Pt will perform bed mobility with supervision  PT Short Term Goal 2 (Week 1): Pt will ambulate 150 ft with min assist and LRAD PT Short Term Goal 3 (Week 1): Pt will propel w/c 150 ft with supervision   Skilled Therapeutic Interventions/Progress Updates: Pt received seated in w/c with wife present, c/o pain 6/10 in L knee and ankle which he suspects are due to gout. Declines standing/gait this session d/t pain. Transported to/from gym totalA. Instructed pt in LE strengthening exercises 3x15 reps each including hip flexion marching, long arc quad, ankle pumps, hip adduction isometric, glute sets. Provided handouts with instruction for performance outside of regular therapy sessions. Pt limited in performing LLE long arc quad and ankle pumps due to pain. Roho hybrid cushion obtained to improve comfort in w/c and provide skin protection to skin breakdown which pt report in intragluteal cleft. Cushion set to necessary specifications; educated pt and wife that when he is not in the cushion it may appear underinflated however it is set correctly. Stand pivot w/c <>mat table with minA. Reviewed use of incentive spirometer; pt with max inhalation volume of 500 mL, cues to slow inhale to 3-4 second duration. Remained seated in w/c at end of session, all needs in reach.      Therapy Documentation Precautions:  Precautions Precautions: Fall, Sternal Precaution Comments: lifevest Restrictions Weight Bearing Restrictions: Yes   See Function Navigator for Current Functional Status.   Therapy/Group: Individual Therapy  [name] 05/25/2017, 7:36 AM

## 2017-05-25 NOTE — Progress Notes (Signed)
Occupational Therapy Session Note  Patient Details  Name: Jack Joseph MRN: [id number] Date of Birth: May 27, 1946  Today's Date: 05/25/2017 OT Individual Time: 1420-1445 OT Individual Time Calculation (min): 25 min    Short Term Goals: Week 1:  OT Short Term Goal 1 (Week 1): Pt will complete 1/3 components of toileting  OT Short Term Goal 2 (Week 1): Pt will initiate 2 rest breaks during bathing for energy conservation  OT Short Term Goal 3 (Week 1): Pt will exhibit increased safety awareness by adhering to sternal precautions during dressing with min vcs   Skilled Therapeutic Interventions/Progress Updates:    1;1. Pain reported in ankle from gout but not rated. Rn already aware. Focus of session on sit to stand, sternal precaution adherence, and LB dressing. Pt attempts to void urine into urinal while seated however soils pants. Pt stands 3x throughout session with VC for arms crossed to maintain sternal precautions and MIN A for powering up to stand. Pt completes clothing management in standing with CGA. Pt educated on energy conservation strategies to thread BLE into both underwear and pants prior to standing. Exited session with pt seated in w/c with call light in reach and all needs met.  Therapy Documentation Precautions:  Precautions Precautions: Fall, Sternal Precaution Comments: lifevest Restrictions Weight Bearing Restrictions: Yes  See Function Navigator for Current Functional Status.   Therapy/Group: Individual Therapy  Tonny Branch 05/25/2017, 2:47 PM

## 2017-05-25 NOTE — Progress Notes (Signed)
Speech Language Pathology Daily Session Note  Patient Details  Name: Jack Joseph MRN: [id number] Date of Birth: [birth year]  Today's Date: 05/25/2017 SLP Individual Time: 6301-6010 SLP Individual Time Calculation (min): 45 min  Short Term Goals: Week 1: SLP Short Term Goal 1 (Week 1): Pt will consume current diet without overt s/s of aspiration and supervision cues for use of compensatory swallow strategies.  SLP Short Term Goal 2 (Week 1): Pt will consume trials of ice chips without overt s/s of aspiration to demonstrate readiness for instrumental study. SLP Short Term Goal 3 (Week 1): Pt will use speech intelligibility strategies with Min A to increase vocal intensity to achieve ~ 75% intelligiblity at the sentence level.   Skilled Therapeutic Interventions: Skilled treatment session focused on dysphagia and speech intelligibility goals. Upon entrance into room, pt's respiratory status appeared much improved. Pt was not SOB during entire session. Pt had not received breakfast tray yet and was waiting for it. In the interim, pt consumed trials of ice chips without overt s/s of aspiration. Pt's wife present and education provided on previous swallow study and trials of ice chips. Pt with continued decreased speech intelligibility d/t decreased vocal intensity. Speech intelligibility strategies given and posted in room. Pt able to implement strategies with Mod A cues to achieve ~ 75% intelligibility at the sentence level. Pt left upright in bed with wife present and he was finished breakfast tray. Wife is able to return demonstration of thickening liquids and is able to provided supervision during meals. Continue per current plan of care.      Function:  Eating Eating   Modified Consistency Diet: No (Trials of ice chips with SLP) Eating Assist Level: Supervision or verbal cues           Cognition Comprehension Comprehension assist level: Follows complex conversation/direction with  extra time/assistive device  Expression   Expression assist level: Expresses complex ideas: With no assist  Social Interaction Social Interaction assist level: Interacts appropriately with others - No medications needed.  Problem Solving Problem solving assist level: Solves complex problems: Recognizes & self-corrects  Memory Memory assist level: Complete Independence: No helper    Pain    Therapy/Group: Individual Therapy  Jack Joseph 05/25/2017, 10:51 AM

## 2017-05-25 NOTE — Progress Notes (Signed)
Occupational Therapy Session Note  Patient Details  Name: Jack Joseph MRN: [id number] Date of Birth: [birth year]  Today's Date: 05/25/2017 OT Individual Time: 1000-1045 and 1300-1330 OT Individual Time Calculation (min): 45 min and 30 min   Short Term Goals: Week 1:  OT Short Term Goal 1 (Week 1): Pt will complete 1/3 components of toileting  OT Short Term Goal 2 (Week 1): Pt will initiate 2 rest breaks during bathing for energy conservation  OT Short Term Goal 3 (Week 1): Pt will exhibit increased safety awareness by adhering to sternal precautions during dressing with min vcs   Skilled Therapeutic Interventions/Progress Updates:    Visit 1: Pain:  Pt with L ankle pain which pt states is gout.  ACE wrapped ankle for support.  Avoided excess wt bearing through foot.   Pt seen for BADL of B/D from EOB. Spouse present for therapy. Pt declined shower today due to fatigue.  From EOB he was able to bathe and dress with min A. To avoid pushing up through arms, had pt cross his arms across his chest for sit to stands. Stood up with steadying A. He also used RW to take a few steps to w/c.  Pt resting in w/c with all needs met.    Visit 2: Pt stated his L ankle was too uncomfortable and did not want to do any standing to avoid putting wt on it.   Pt agreeable to working on AROM exercises for UE to improve his activity tolerance.  Pt used one arm at a time for shoulder circles with arm held straight at 90 degrees of flexion for 10 rotations outward and 10 inward, 3 sets with short rest breaks between sets. AROM of D1 PNF patterns for 15 on each arm, 2 sets. Pt resting in w/c with all needs met.  Therapy Documentation Precautions:  Precautions Precautions: Fall, Sternal Precaution Comments: lifevest Restrictions Weight Bearing Restrictions: Yes   ADL: ADL ADL Comments: Please see functional navigator for ADL status  See Function Navigator for Current Functional  Status.   Therapy/Group: Individual Therapy  [place] 05/25/2017, 12:28 PM

## 2017-05-25 NOTE — Progress Notes (Signed)
Patient ID: Jack Joseph, male   DOB: 05-01-1946, 71 y.o.   MRN: [id number]   05/25/17.  Jack Joseph is a 71 y.o. male  Admit for CIR with functional deficits secondary to debility  and deconditioningsecondary to aortic root repair with postoperative cardiac arrest  Subjective: Remains weak; c/o pain L knee and L ankle pain.  States he believes  pain is gouty arthritis.  Past Medical History:  Diagnosis Date  . AVD (aortic valve disease)   . Chronic anticoagulation   . Dilated cardiomyopathy (HCC)    EF 30-35%  . Dysrhythmia   . Hyperlipidemia   . LV dysfunction      Objective: Vital signs in last 24 hours: Temp:  [97.9 F (36.6 C)-98.7 F (37.1 C)] 98.7 F (37.1 C) (07/14 0500) Pulse Rate:  [93] 93 (07/14 0500) Resp:  [18-20] 18 (07/14 0500) BP: (101-106)/(55-56) 106/55 (07/14 0500) SpO2:  [94 %-98 %] 97 % (07/14 0818) Weight:  [144 lb 10 oz (65.6 kg)] 144 lb 10 oz (65.6 kg) (07/14 0500) Weight change:  Last BM Date: 05/24/17  Intake/Output from previous day: 07/13 0701 - 07/14 0700 In: 560 [P.O.:560] Out: 1825 [Urine:1825] Last cbgs: CBG (last 3)  No results for input(s): GLUCAP in the last 72 hours.   Physical Exam General: No apparent distress  Weak; N/C O2 in place HEENT: not dry Lungs: Normal effort. Lungs scattered rhonchi  Cardiovascular: Irregular; prosthetic heart sounds Abdomen: S/NT/ND; BS(+) Musculoskeletal:  unchanged Neurological: No new neurological deficits Wounds:clean  Extremities- no edema Mental state: Alert, oriented, cooperative  BP Readings from Last 3 Encounters:  05/25/17 (!) 106/55  05/23/17 101/60  04/25/17 112/66    Lab Results: BMET    Component Value Date/Time   NA 135 05/25/2017 0453   NA 135 02/27/2017 1637   K 4.0 05/25/2017 0453   CL 96 (L) 05/25/2017 0453   CO2 34 (H) 05/25/2017 0453   GLUCOSE 101 (H) 05/25/2017 0453   BUN 11 05/25/2017 0453   BUN 15 02/27/2017 1637   CREATININE 0.73 05/25/2017  0453   CALCIUM 8.3 (L) 05/25/2017 0453   GFRNONAA >60 05/25/2017 0453   GFRAA >60 05/25/2017 0453   CBC    Component Value Date/Time   WBC 9.1 05/24/2017 0500   RBC 3.33 (L) 05/24/2017 0500   HGB 9.5 (L) 05/24/2017 0500   HGB 13.5 02/27/2017 1636   HCT 31.3 (L) 05/24/2017 0500   HCT 38.7 02/27/2017 1636   PLT 462 (H) 05/24/2017 0500   PLT 350 02/27/2017 1636   MCV 94.0 05/24/2017 0500   MCV 87 02/27/2017 1636   MCH 28.5 05/24/2017 0500   MCHC 30.4 05/24/2017 0500   RDW 15.0 05/24/2017 0500   RDW 14.3 02/27/2017 1636   LYMPHSABS 1.2 05/24/2017 0500   LYMPHSABS 1.5 02/27/2017 1636   MONOABS 1.1 (H) 05/24/2017 0500   EOSABS 0.1 05/24/2017 0500   EOSABS 0.1 02/27/2017 1636   BASOSABS 0.0 05/24/2017 0500   BASOSABS 0.1 02/27/2017 1636   Lab Results  Component Value Date   INR 3.38 05/25/2017   INR 2.58 05/24/2017   INR 2.51 05/23/2017    Medications: I have reviewed the patient's current medications.  Assessment/Plan:  Deconditioningsecondary to aortic root repair with postoperative cardiac arrest             Continue CIR            VT arrest s/p DCCV X 3   Replacement of Ascending aorta/Wheat procedure/DVT  Prophylaxis/Anticoagulation: Pharmaceutical: Coumadin  Fluids/Electrolytes/Nutrition: Strict I/Os. Check daily weights.              BMP within acceptable range on 7/13 Chronic systolic CHF with Hypotension   ABLA:              Hb 9.5 on 7/13             Labs ordered for Monday             Cont to monitor             Hemoccult ordered  Length of stay, days: 2  Rogelia Boga , MD 05/25/2017, 9:35 AM

## 2017-05-25 NOTE — Progress Notes (Signed)
Patient ID: Jack Joseph, male   DOB: 07-12-1946, 71 y.o.   MRN: [id number]      [address]       Jack [place] [address]             [phone]                     LOS: 2 days   Subjective: Notes less sob today , did well in rehab this am, but this afternoon notes left ankle is hurting, has history of gout before   Objective: Vital signs in last 24 hours: Patient Vitals for the past 24 hrs:  BP Temp Temp src Pulse Resp SpO2 Weight  05/25/17 0818 - - - - - 97 % -  05/25/17 0500 (!) 106/55 98.7 F (37.1 C) Oral 93 18 94 % 144 lb 10 oz (65.6 kg)  05/24/17 2028 - - - - - 95 % -  05/24/17 1357 - - - - - 94 % -    Filed Weights   05/25/17 0500  Weight: 144 lb 10 oz (65.6 kg)    Hemodynamic parameters for last 24 hours:    Intake/Output from previous day: 07/13 0701 - 07/14 0700 In: 560 [P.O.:560] Out: 1825 [Urine:1825] Intake/Output this shift: No intake/output data recorded.  Scheduled Meds: . amiodarone  200 mg Oral Daily  . feeding supplement (PRO-STAT SUGAR FREE 64)  30 mL Oral BID  . levalbuterol  0.63 mg Nebulization TID  . losartan  12.5 mg Oral Daily  . magic mouthwash  5 mL Oral TID  . pantoprazole  40 mg Oral Daily  . potassium chloride  20 mEq Oral BID  . sodium chloride flush  10-40 mL Intracatheter Q12H  . Warfarin - Pharmacist Dosing Inpatient   Does not apply q1800   Continuous Infusions: PRN Meds:.acetaminophen, alum & mag hydroxide-simeth, bisacodyl, diphenhydrAMINE, guaiFENesin-dextromethorphan, levalbuterol, polyethylene glycol, prochlorperazine **OR** prochlorperazine **OR** prochlorperazine, RESOURCE THICKENUP CLEAR, sodium chloride flush, sodium phosphate, traZODone  General appearance: alert and cooperative Neurologic: intact Heart: regular rate and rhythm, S1, S2 normal, no murmur, click, rub or gallop Lungs: diminished breath sounds LLL Abdomen: soft, non-tender; bowel sounds normal; no masses,  no  organomegaly Extremities: edema some swelling and tenderness left ankle  Wound: sternum stable   Lab Results: CBC: Recent Labs  05/23/17 0353 05/24/17 0500  WBC 11.0* 9.1  HGB 10.1* 9.5*  HCT 32.0* 31.3*  PLT 511* 462*   BMET:  Recent Labs  05/24/17 0500 05/25/17 0453  NA 137 135  K 3.9 4.0  CL 100* 96*  CO2 32 34*  GLUCOSE 103* 101*  BUN 8 11  CREATININE 0.65 0.73  CALCIUM 8.1* 8.3*    PT/INR:  Recent Labs  05/25/17 0453  LABPROT 34.9*  INR 3.38   Lab Results  Component Value Date   INR 3.38 05/25/2017   INR 2.58 05/24/2017   INR 2.51 05/23/2017     Radiology No results found.   Assessment/Plan: S/P  inr 2.5 to 3.3 one day  therapy for gout per medical service     [name] MD 05/25/2017 1:27 PM

## 2017-05-26 ENCOUNTER — Encounter (HOSPITAL_COMMUNITY): Payer: Self-pay

## 2017-05-26 LAB — PROTIME-INR
INR: 3.98
Prothrombin Time: 39.9 seconds — ABNORMAL HIGH (ref 11.4–15.2)

## 2017-05-26 LAB — OCCULT BLOOD X 1 CARD TO LAB, STOOL: Fecal Occult Bld: NEGATIVE

## 2017-05-26 MED ORDER — TRAMADOL HCL 50 MG PO TABS
50.0000 mg | ORAL_TABLET | Freq: Four times a day (QID) | ORAL | Status: DC
  Administered 2017-05-26 – 2017-05-29 (×10): 50 mg via ORAL
  Filled 2017-05-26 (×13): qty 1

## 2017-05-26 MED ORDER — PANTOPRAZOLE SODIUM 40 MG PO PACK
40.0000 mg | PACK | Freq: Every day | ORAL | Status: DC
  Administered 2017-05-26 – 2017-05-29 (×4): 40 mg via ORAL
  Filled 2017-05-26 (×3): qty 20

## 2017-05-26 NOTE — Progress Notes (Addendum)
05/26/17: Pt assisted OOB this am. C/O headache after up approx. 10 mins.  Denied dizziness. B/P 87/49  ( see FS)  Dr. Amador Cunas aware, orders received. Orthostatics checked, B/P unchanged. Pt rested well (lying) without c/o....denied headache throughout shift.  Assisted OOB at 1400, c/o 9/10 frontal headache after up few minutes, reports "slightly dizzy".  Pt had THTs on. Alert and oriented, skin warm and dry, no SOB, denies nausea, CP, visual changes.. Sat. 95% 3l's.   1800:  Tramadol 50mg  given at 1630. Pt asleep at 1730. Pt states Tramadol given this am min. effective.

## 2017-05-26 NOTE — Progress Notes (Signed)
ANTICOAGULATION CONSULT NOTE - Follow Up Consult  Pharmacy Consult for Warfarin Indication: mechanical aortic valve  Allergies  Allergen Reactions  . Morphine And Related Itching  . No Known Allergies     Patient Measurements: Weight: 142 lb 6.7 oz (64.6 kg)  Vital Signs: Temp: 97.7 F (36.5 C) (07/15 1400) Temp Source: Axillary (07/15 1400) BP: 103/57 (07/15 1400) Pulse Rate: 90 (07/15 1400)  Labs:  Recent Labs  05/24/17 0500 05/25/17 0453 05/26/17 0523  HGB 9.5*  --   --   HCT 31.3*  --   --   PLT 462*  --   --   LABPROT 28.2* 34.9* 39.9*  INR 2.58 3.38 3.98  CREATININE 0.65 0.73  --     Estimated Creatinine Clearance: 76.3 mL/min (by C-G formula based on SCr of 0.73 mg/dL).   Medical History: Past Medical History:  Diagnosis Date  . AVD (aortic valve disease)   . Chronic anticoagulation   . Dilated cardiomyopathy (HCC)    EF 30-35%  . Dysrhythmia   . Hyperlipidemia   . LV dysfunction     Assessment: 71 yo male s/p ascending aorta replacement with history of mech aortic valve (goal per [facility] clinic 2.5-3.5) Pt  transferred  to rehab 7/12 and pharmacy consulted to dose coumadin INR up to 3.98 today > goal. No bleeding noted  home dose: 5mg  daily except 2.5mg  on Mondays and Thursdays   Goal of Therapy:  INR 2.5-3.5 Monitor platelets by anticoagulation protocol: Yes   Plan:  No coumadin today Daily PT/INR  Leota Sauers Pharm.D. CPP, BCPS Clinical Pharmacist [phone] 05/26/2017 3:10 PM

## 2017-05-26 NOTE — Progress Notes (Signed)
Patient ID: Jack Joseph, male   DOB: [birth year], 71 y.o.   MRN: [id number]   05/26/17.  Jack Joseph is a 71 y.o. male  Admitted for CIR with functional deficits secondary to debility and deconditioning following aortic root repair and postoperative cardiac arrest.  Subjective: Patient was seen yesterday with left knee and left foot pain.  He states the left knee pain secondary to arthritis and has been a intermittent problem for 6 months.  He was treated with 2 doses of colchicine for possible gout involving the left foot.  The left foot pain has resolved.  He states that prior to admission he was on a maintenance dose of colchicine twice daily. Today he is doing well except for some significant headaches.  He is ambulatory with assistance.  Blood pressure has been quite low today.  BP Readings from Last 3 Encounters:  05/26/17 (!) 88/50  05/23/17 101/60  04/25/17 112/66   Past Medical History:  Diagnosis Date  . AVD (aortic valve disease)   . Chronic anticoagulation   . Dilated cardiomyopathy (HCC)    EF 30-35%  . Dysrhythmia   . Hyperlipidemia   . LV dysfunction     Objective: Vital signs in last 24 hours: Temp:  [97.5 F (36.4 C)-98 F (36.7 C)] 98 F (36.7 C) (07/15 0500) Pulse Rate:  [82-113] 82 (07/15 0730) Resp:  [18] 18 (07/15 0730) BP: (87-105)/(49-59) 88/50 (07/15 0730) SpO2:  [91 %-97 %] 91 % (07/15 0500) Weight:  [142 lb 6.7 oz (64.6 kg)] 142 lb 6.7 oz (64.6 kg) (07/15 0500) Weight change: -2 lb 3.3 oz (-1 kg) Last BM Date: 05/24/17  Intake/Output from previous day: 07/14 0701 - 07/15 0700 In: 360 [P.O.:360] Out: 769 [Urine:769] Last cbgs: CBG (last 3)  No results for input(s): GLUCAP in the last 72 hours.   Physical Exam General: Uncomfortable due to headache; cold compresses over the forehead area HEENT: not dry.  Nasal cannula O2 Lungs: Diminished breath sounds.  Sounds clearer Cardiovascular: Irregular rate and rhythm, no edema;  prosthetic heart sounds Abdomen: S/NT/ND; BS(+) Musculoskeletal:  Tenderness involving the left ankle and dorsum of the foot has resolved.  No inflammatory changes involving the left knee Neurological: No new neurological deficits.  Generally weak Wounds: Clean  Skin: clear  Mental state: Alert, oriented, cooperative  Lab Results  Component Value Date   INR 3.98 05/26/2017   INR 3.38 05/25/2017   INR 2.58 05/24/2017    Lab Results: BMET    Component Value Date/Time   NA 135 05/25/2017 0453   NA 135 02/27/2017 1637   K 4.0 05/25/2017 0453   CL 96 (L) 05/25/2017 0453   CO2 34 (H) 05/25/2017 0453   GLUCOSE 101 (H) 05/25/2017 0453   BUN 11 05/25/2017 0453   BUN 15 02/27/2017 1637   CREATININE 0.73 05/25/2017 0453   CALCIUM 8.3 (L) 05/25/2017 0453   GFRNONAA >60 05/25/2017 0453   GFRAA >60 05/25/2017 0453   CBC    Component Value Date/Time   WBC 9.1 05/24/2017 0500   RBC 3.33 (L) 05/24/2017 0500   HGB 9.5 (L) 05/24/2017 0500   HGB 13.5 02/27/2017 1636   HCT 31.3 (L) 05/24/2017 0500   HCT 38.7 02/27/2017 1636   PLT 462 (H) 05/24/2017 0500   PLT 350 02/27/2017 1636   MCV 94.0 05/24/2017 0500   MCV 87 02/27/2017 1636   MCH 28.5 05/24/2017 0500   MCHC 30.4 05/24/2017 0500   RDW 15.0 05/24/2017  0500   RDW 14.3 02/27/2017 1636   LYMPHSABS 1.2 05/24/2017 0500   LYMPHSABS 1.5 02/27/2017 1636   MONOABS 1.1 (H) 05/24/2017 0500   EOSABS 0.1 05/24/2017 0500   EOSABS 0.1 02/27/2017 1636   BASOSABS 0.0 05/24/2017 0500   BASOSABS 0.1 02/27/2017 1636    Medications: I have reviewed the patient's current medications.  Assessment/Plan:  Deconditioning following aortic root repair with postop VT arrest.  Continue CIR Hypotension.  Will hold losartan this a.m.  CBC ordered for the morning.  Stool for occult blood has been ordered Coumadin anticoagulation Headaches.  Will treat with Tylenol Left foot pain.  Possible gout resolved.  Chronic systolic heart  failure     Length of stay, days: 3  Jack Joseph , MD 05/26/2017, 8:38 AM   The back and off the orders written orders for that and is

## 2017-05-27 ENCOUNTER — Inpatient Hospital Stay (HOSPITAL_COMMUNITY): Payer: Medicare Other

## 2017-05-27 ENCOUNTER — Inpatient Hospital Stay (HOSPITAL_COMMUNITY): Payer: Medicare Other | Admitting: Occupational Therapy

## 2017-05-27 DIAGNOSIS — R1314 Dysphagia, pharyngoesophageal phase: Secondary | ICD-10-CM

## 2017-05-27 LAB — BASIC METABOLIC PANEL
Anion gap: 7 (ref 5–15)
BUN: 16 mg/dL (ref 6–20)
CALCIUM: 8.6 mg/dL — AB (ref 8.9–10.3)
CO2: 30 mmol/L (ref 22–32)
CREATININE: 0.76 mg/dL (ref 0.61–1.24)
Chloride: 99 mmol/L — ABNORMAL LOW (ref 101–111)
GFR calc non Af Amer: >60 mL/min (ref >60)
GLUCOSE: 112 mg/dL — AB (ref 65–99)
Potassium: 4.4 mmol/L (ref 3.5–5.1)
Sodium: 136 mmol/L (ref 135–145)

## 2017-05-27 LAB — CBC WITH DIFFERENTIAL/PLATELET
BASOS PCT: 1 %
Basophils Absolute: 0 10*3/uL (ref 0.0–0.1)
EOS ABS: 0.2 10*3/uL (ref 0.0–0.7)
EOS PCT: 2 %
HCT: 33.3 % — ABNORMAL LOW (ref 39.0–52.0)
Hemoglobin: 10.3 g/dL — ABNORMAL LOW (ref 13.0–17.0)
LYMPHS ABS: 1.4 10*3/uL (ref 0.7–4.0)
Lymphocytes Relative: 17 %
MCH: 29 pg (ref 26.0–34.0)
MCHC: 30.9 g/dL (ref 30.0–36.0)
MCV: 93.8 fL (ref 78.0–100.0)
MONOS PCT: 8 %
Monocytes Absolute: 0.6 10*3/uL (ref 0.1–1.0)
Neutro Abs: 5.6 10*3/uL (ref 1.7–7.7)
Neutrophils Relative %: 72 %
PLATELETS: 365 10*3/uL (ref 150–400)
RBC: 3.55 MIL/uL — ABNORMAL LOW (ref 4.22–5.81)
RDW: 15.4 % (ref 11.5–15.5)
WBC: 7.8 10*3/uL (ref 4.0–10.5)

## 2017-05-27 LAB — PROTIME-INR
INR: 3.71
PROTHROMBIN TIME: 37.7 s — AB (ref 11.4–15.2)

## 2017-05-27 MED ORDER — WARFARIN 0.5 MG HALF TABLET
0.5000 mg | ORAL_TABLET | Freq: Once | ORAL | Status: AC
  Administered 2017-05-27: 0.5 mg via ORAL
  Filled 2017-05-27 (×2): qty 1

## 2017-05-27 MED ORDER — FUROSEMIDE 10 MG/ML IJ SOLN: 40.0000 mg | Freq: Every day | INTRAMUSCULAR | Status: DC

## 2017-05-27 MED ORDER — TRAZODONE HCL 50 MG PO TABS
25.0000 mg | ORAL_TABLET | Freq: Every evening | ORAL | Status: AC | PRN
  Administered 2017-05-27: 25 mg via ORAL
  Filled 2017-05-27: qty 1

## 2017-05-27 MED ORDER — TRAZODONE HCL 50 MG PO TABS
25.0000 mg | ORAL_TABLET | Freq: Every day | ORAL | Status: DC
  Administered 2017-05-27 – 2017-05-29 (×3): 25 mg via ORAL
  Filled 2017-05-27 (×4): qty 1

## 2017-05-27 NOTE — Progress Notes (Signed)
Speech Language Pathology Daily Session Note  Patient Details  Name: Jack Joseph MRN: [id number] Date of Birth: 03/20/1946  Today's Date: 05/27/2017 SLP Individual Time: 1194-1740 SLP Individual Time Calculation (min): 30 min  Short Term Goals: Week 1: SLP Short Term Goal 1 (Week 1): Pt will consume current diet without overt s/s of aspiration and supervision cues for use of compensatory swallow strategies.  SLP Short Term Goal 2 (Week 1): Pt will consume trials of ice chips without overt s/s of aspiration to demonstrate readiness for instrumental study. SLP Short Term Goal 3 (Week 1): Pt will use speech intelligibility strategies with Min A to increase vocal intensity to achieve ~ 75% intelligiblity at the sentence level.  SLP Short Term Goal 4 (Week 1): Patient will perform EMST for 25 repetitions with supervision verbal cues and a self-percieved effort level of 7/10.   Skilled Therapeutic Interventions: Skilled treatment session focused on dysphagia goals. SLP facilitated session by introducing the EMST device. Patient performed 25 repetitions at 18 cm H2O with supervision verbal cues and a self-perceived effort level of 6/10. Discussed with patient possibility of increasing the resistance of device if effort level stays below a 7/10 throughout the duration of th day. He verbalized understanding. Patient's wife present and thickened coffee for patient, however, coffee was too thin and his wife was re-educated on how to appropriately thicken liquids and strategies to utilize to know the liquid is the appropriate viscosity. She verbalized understanding. Patient left sitting EOB with wife present. Continue with current plan of care.      Function:  Eating Eating   Modified Consistency Diet: Yes Eating Assist Level: More than reasonable amount of time;Supervision or verbal cues           Cognition Comprehension Comprehension assist level: Understands complex 90% of the time/cues  10% of the time  Expression   Expression assist level: Expresses complex 90% of the time/cues < 10% of the time  Social Interaction Social Interaction assist level: Interacts appropriately with others - No medications needed.  Problem Solving Problem solving assist level: Solves complex 90% of the time/cues < 10% of the time  Memory Memory assist level: Recognizes or recalls 90% of the time/requires cueing < 10% of the time    Pain Pain Assessment Pain Assessment: Faces Faces Pain Scale: No hurt  Therapy/Group: Individual Therapy  Arliss Frisina 05/27/2017, 9:11 AM

## 2017-05-27 NOTE — Progress Notes (Signed)
Social Work Assessment and Plan Social Work Assessment and Plan  Patient Details  Name: Jack Joseph MRN: [id number] Date of Birth: 1946/08/14  Today's Date: 05/27/2017  Problem List:  Patient Active Problem List   Diagnosis Date Noted  . Acute blood loss anemia   . History of cardiac arrest   . Dysphagia   . Hypoalbuminemia due to protein-calorie malnutrition (HCC)   . Pleural effusion   . Debility 05/23/2017  . Atelectasis   . Hyperglycemia   . Delirium   . Complication of chest tube   . Chest tube in place   . Acute encephalopathy   . Essential hypertension   . Dilatation of aorta (HCC)   . Postop check   . Acute respiratory failure with hypoxia (HCC)   . VT (ventricular tachycardia) (HCC)   . 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 Thoracic aortic aneurysm without rupture (HCC) 02/25/2017  . Mass of left lung 02/25/2017  . Acute on chronic systolic (congestive) heart failure (HCC) 02/25/2017  . CHF (congestive heart failure) (HCC) 07/10/2016  . Varicose veins of both lower extremities 07/10/2016  . Erectile dysfunction 07/10/2016  . AVD (aortic valve disease)   . Dilated cardiomyopathy (HCC)   . Hyperlipidemia   . Chronic anticoagulation   . Hx of replacement of aortic valve 03/15/2011   Past Medical History:  Past Medical History:  Diagnosis Date  . AVD (aortic valve disease)   . Chronic anticoagulation   . Dilated cardiomyopathy (HCC)    EF 30-35%  . Dysrhythmia   . Hyperlipidemia   . LV dysfunction    Past Surgical History:  Past Surgical History:  Procedure Laterality Date  . AORTIC VALVE REPLACEMENT    . BENTALL PROCEDURE N/A 04/29/2017   Procedure: REPLACEMENT ASCENDING AORTA/ WHEAT PROCEDURE, HYPOTHERMIC CIRCULATORY ARREST AND CARDIOPULMONARY BYPASS, AND USE OF HEMASHIELD PLATINUM WOVEN DOUBLE VELOUR VASCULAR GRAFT 32 MM X 50 CM;  Surgeon: [name], MD;  Location: [facility] OR;  Service: Open Heart Surgery;  Laterality: N/A;  .  DOPPLER ECHOCARDIOGRAPHY  04/07/2002   EF 30-35%  . RIGHT/LEFT HEART CATH AND CORONARY ANGIOGRAPHY N/A 03/05/2017   Procedure: Right/Left Heart Cath and Coronary Angiography;  Surgeon: Peter M Swaziland, MD;  Location: [facility] INVASIVE CV LAB;  Service: Cardiovascular;  Laterality: N/A;  . TEE WITHOUT CARDIOVERSION N/A 04/29/2017   Procedure: TRANSESOPHAGEAL ECHOCARDIOGRAM (TEE);  Surgeon: [name], MD;  Location: [facility] OR;  Service: Open Heart Surgery;  Laterality: N/A;   Social History:  reports that he quit smoking about 16 years ago. He has never used smokeless tobacco. He reports that he does not drink alcohol or use drugs.  Family / Support Systems Marital Status: Married Patient Roles: Spouse, Parent, Other (Comment) (employee) Spouse/Significant Other: Dois Davenport [phone] Children: Kristen [name]-daughter [phone] Other Supports: Doug-son [phone] Anticipated Caregiver: Wife Ability/Limitations of Caregiver: Wife can assist she does photography for the [employer] Caregiver Availability: 24/7 Family Dynamics: Close knit family, his wife will be his primary caregiver. He is hopeful he will do well here. They have extended family and freinds who are supportive and come by and visit him.  Social History Preferred language: English Religion: Lutheran Cultural Background: No issues Education: High School Read: Yes Write: Yes Employment Status: Employed Name of Employer: [employer]-6 hrs/5 days a week Return to Work Plans: Would like to be able to return he is payinf for daughter's legal bill from custody case with husband-this is the reason he is working  Legal Hisotry/Current Legal Issues: No issues Guardian/Conservator: None-according to MD pt is not fully capable of making his own decisions, he appears to be able to make his own decisions, but will make sure wife is here if any need to be made while here.   Abuse/Neglect Physical Abuse: Denies Verbal Abuse:  Denies Sexual Abuse: Denies Exploitation of patient/patient's resources: Denies Self-Neglect: Denies  Emotional Status Pt's affect, behavior adn adjustment status: Pt is motivated to do well he relaizes how deconditioned he is and it will take time to recover from these health issues. He feels much better to day then he did on Friday. He wants to be able to take care of himself by the time he leaves here. Recent Psychosocial Issues: other health issues, financial stress of daughter's legal bills, etc Pyschiatric History: No history deferred depression screen due to tired from therapies. DO feel he would benefit from seeing neuro-psych while here for coping and family dynamic issues. Substance Abuse History: No issues  Patient / Family Perceptions, Expectations & Goals Pt/Family understanding of illness & functional limitations: Pt and wife can explain his health issues and treatment plan. He has spoken with the MD and feels he is on track now to be able to participate and recover from his health issues. Premorbid pt/family roles/activities: Husband, father, grandfather, employee, friend, church member, etc Anticipated changes in roles/activities/participation: resume Pt/family expectations/goals: Pt states: " I want to be able to take care of myself, I know I will get there just need to get stronger to do this."  Wife states: " I hope he does well here and recovers."  [employer]: None Premorbid Home Care/DME Agencies: None Transportation available at discharge: Wife Resource referrals recommended: Neuropsychology, Support group (specify)  Discharge Planning Living Arrangements: Spouse/significant other Support Systems: Spouse/significant other, Children, Other relatives, Manufacturing engineer, Psychologist, clinical community Type of Residence: Private residence Community education officer Resources: [employer], Media planner (specify) (Commerical ins) Surveyor, quantity Resources: Employment,  Restaurant manager, fast food Screen Referred: No Living Expenses: Own Money Management: Spouse, Patient Does the patient have any problems obtaining your medications?: No Home Management: Wife does the home management Patient/Family Preliminary Plans: Return home with wife who can provide assistance to him. Both are hopeful he will do well and only need supervision level and eventually be able to return to his job at [employer]. Aware of team conference Wed will work on safe discharge plan. Wife does come in at lunch to assist with his eating and supervision. Social Work Anticipated Follow Up Needs: HH/OP, Support Group  Clinical Impression Pleasant gentleman who has a lot on his with working to pay his daughter's legal fees, along with his health issues. His wife is supportive and able to assist, she assists with his lunch everyday while here. Speech has checked her off. Will work on discharge plans and feel he would benefit from seeing neuro-psych while here.  Lucy Chris 05/27/2017, 12:03 PM

## 2017-05-27 NOTE — Progress Notes (Signed)
Occupational Therapy Session Note  Patient Details  Name: Jack Joseph MRN: [id number] Date of Birth: [birth year]  Today's Date: 05/27/2017 OT Individual Time: 0900-1000 OT Individual Time Calculation (min): 60 min    Short Term Goals: Week 1:  OT Short Term Goal 1 (Week 1): Pt will complete 1/3 components of toileting  OT Short Term Goal 2 (Week 1): Pt will initiate 2 rest breaks during bathing for energy conservation  OT Short Term Goal 3 (Week 1): Pt will exhibit increased safety awareness by adhering to sternal precautions during dressing with min vcs   Skilled Therapeutic Interventions/Progress Updates:    Pt completed bathing and dressing sit to stand on the EOB.  Oxygen sats 99% on 2Ls nasal cannula at rest, decreasing to 89% on room air with bathing.  Min instructional cueing with min assist for sit to stand transitions, adhering to sternal precautions.  Min instructional cueing for sustained attention as well as he would talk frequently and get distracted during session.  Finished session with pt in wheelchair after min assist transfer with the RW.  He continued grooming tasks in wheelchair for shaving and brushing his teeth.  Call button and phone in reach.    Therapy Documentation Precautions:  Precautions Precautions: Fall, Sternal Precaution Comments: lifevest Restrictions Weight Bearing Restrictions: No  Pain: Pain Assessment Pain Assessment: No/denies pain ADL: See Function Navigator for Current Functional Status.   Therapy/Group: Individual Therapy  Jaimere Feutz OTR/L 05/27/2017, 12:28 PM

## 2017-05-27 NOTE — Progress Notes (Signed)
Physical Therapy Session Note  Patient Details  Name: Jack Joseph MRN: [id number] Date of Birth: 1946/09/23  Today's Date: 05/27/2017 PT Individual Time: 1100-1200, 1300-1400 PT Individual Time Calculation (min): 60 min, 60 min  Short Term Goals: Week 1:  PT Short Term Goal 1 (Week 1): Pt will perform bed mobility with supervision  PT Short Term Goal 2 (Week 1): Pt will ambulate 150 ft with min assist and LRAD PT Short Term Goal 3 (Week 1): Pt will propel w/c 150 ft with supervision   Skilled Therapeutic Interventions/Progress Updates:     Session 1: Pt sitting in w/c upon PT arrival, agreeable to therapy tx. Pt propelled w/c to gym using LEs with supervision x 150 ft. Pt performed car transfer with min assist, stand step transfer with RW. Pt ambulated up/down incline x 10 ft with RW and min assist working on navigating uneven surfaces. Pt ascended/descended 4 steps using B handrails and min assist reciprocal going up and step to going down, verbal cues for technique. SpO2 93% after stair navigation. Pt with increased rest breaks between activities and cues for breathing secondary to shortness of breath with activity. Pt ambulated x 84 ft using RW and min assist for balance, limited by decreased endurance and fatigue, SpO2 89% after ambulation. Seated pt performed LE strengthening exercises 2 x 10 hip flexion and 2 x 10 LAQ. Standing pt performed 2 x 10 marches using RW and 2 x 10 hip abduction with supervision and verbal cues for technique. SpO2 97% after seated and standing exercises. Pt ambulated 65 ft back to his w/c using RW and min assist for balance. Pt propelled w/c back to room using B LEs and supervision. Pt left seated in w/c with call bell in reach and wife in room.    Pt denies pain this session.   Session 2: Pt sitting in w/c upon PT arrival, agreeable to therapy tx. Pt propelled w/c to gym using LEs with supervision x 150 ft. Pt standing on foam 2 x 30 sec with eyes open and 2  x 30 sec with eyes closed, no UE support and increased posterior sway noted with eyes closed. Pt worked on Dynamic balance while standing on foam without UE support to thrown horse shoes and to hit ball back and forth. Pt ambulated x 20 ft without AD and min assist, x 80 ft without AD and min assist and x 150 ft with RW and min assist. Pt required rest breaks in between ambulation secondary to decreased endurance and activity tolerance. Pt performed side stepping x 10 ft x 2 in each direction without AD and min assist for balance, working on hip strengthening. Pt ambulated back to room with RW and min assist. Pt transferred to bed with min assist and EOB to supine with supervision. Pt left supine in bed with call bell in reach and wife in room.  Pt denies pain this session. SpO2 monitored throughout session and remained >94%.      Therapy Documentation Precautions:  Precautions Precautions: Fall, Sternal Precaution Comments: lifevest Restrictions Weight Bearing Restrictions: Yes   See Function Navigator for Current Functional Status.   Therapy/Group: Individual Therapy  Cresenciano Genre, PT, DPT 05/27/2017, 11:30 AM

## 2017-05-27 NOTE — Progress Notes (Signed)
Subjective/Complaints: Headache in afternoon  ROS No CP, SOB, N/V/D  Objective: Vital Signs: Blood pressure 116/63, pulse 87, temperature 98.1 F (36.7 C), temperature source Oral, resp. rate 20, weight 63.6 kg (140 lb 3.4 oz), SpO2 97 %. No results found. Results for orders placed or performed during the hospital encounter of 05/23/17 (from the past 72 hour(s))  Protime-INR     Status: Abnormal   Collection Time: 05/25/17  4:53 AM  Result Value Ref Range   Prothrombin Time 34.9 (H) 11.4 - 15.2 seconds   INR 3.38   Basic metabolic panel     Status: Abnormal   Collection Time: 05/25/17  4:53 AM  Result Value Ref Range   Sodium 135 135 - 145 mmol/L   Potassium 4.0 3.5 - 5.1 mmol/L   Chloride 96 (L) 101 - 111 mmol/L   CO2 34 (H) 22 - 32 mmol/L   Glucose, Bld 101 (H) 65 - 99 mg/dL   BUN 11 6 - 20 mg/dL   Creatinine, Ser 4.12 0.61 - 1.24 mg/dL   Calcium 8.3 (L) 8.9 - 10.3 mg/dL   GFR calc non Af Amer >60 >60 mL/min   GFR calc Af Amer >60 >60 mL/min    Comment: (NOTE) The eGFR has been calculated using the CKD EPI equation. This calculation has not been validated in all clinical situations. eGFR's persistently <60 mL/min signify possible Chronic Kidney Disease.    Anion gap 5 5 - 15  Protime-INR     Status: Abnormal   Collection Time: 05/26/17  5:23 AM  Result Value Ref Range   Prothrombin Time 39.9 (H) 11.4 - 15.2 seconds   INR 3.98   Occult blood card to lab, stool RN will collect     Status: None   Collection Time: 05/26/17  6:58 AM  Result Value Ref Range   Fecal Occult Bld NEGATIVE NEGATIVE  Protime-INR     Status: Abnormal   Collection Time: 05/27/17  4:46 AM  Result Value Ref Range   Prothrombin Time 37.7 (H) 11.4 - 15.2 seconds   INR 3.71   CBC with Differential/Platelet     Status: Abnormal   Collection Time: 05/27/17  7:52 AM  Result Value Ref Range   WBC 7.8 4.0 - 10.5 K/uL   RBC 3.55 (L) 4.22 - 5.81 MIL/uL   Hemoglobin 10.3 (L) 13.0 - 17.0 g/dL   HCT  73.7 (L) 55.3 - 52.0 %   MCV 93.8 78.0 - 100.0 fL   MCH 29.0 26.0 - 34.0 pg   MCHC 30.9 30.0 - 36.0 g/dL   RDW 63.1 86.6 - 62.8 %   Platelets 365 150 - 400 K/uL   Neutrophils Relative % 72 %   Neutro Abs 5.6 1.7 - 7.7 K/uL   Lymphocytes Relative 17 %   Lymphs Abs 1.4 0.7 - 4.0 K/uL   Monocytes Relative 8 %   Monocytes Absolute 0.6 0.1 - 1.0 K/uL   Eosinophils Relative 2 %   Eosinophils Absolute 0.2 0.0 - 0.7 K/uL   Basophils Relative 1 %   Basophils Absolute 0.0 0.0 - 0.1 K/uL     HEENT: hoarse voice Cardio: RRR and no murmur Resp: CTA B/L and unlabored GI: BS positive and NT, ND Extremity:  BS positive and NT, ND Skin:   Intact Neuro: Flat and Abnormal Motor 4/5 in BUE and BLE Musc/Skel:  Other no pain with UE or LE ROM Gen NAD   Assessment/Plan: 1. Functional deficits secondary to Debility, s/p  cardiac arrest which require 3+ hours per day of interdisciplinary therapy in a comprehensive inpatient rehab setting. Physiatrist is providing close team supervision and 24 hour management of active medical problems listed below. Physiatrist and rehab team continue to assess barriers to discharge/monitor patient progress toward functional and medical goals. FIM: Function - Bathing Position: Sitting EOB Body parts bathed by patient: Right arm, Left arm, Chest, Abdomen, Right upper leg, Left upper leg, Right lower leg, Left lower leg, Front perineal area Body parts bathed by helper: Buttocks, Back Bathing not applicable: Front perineal area, Buttocks  Function- Upper Body Dressing/Undressing What is the patient wearing?: Pull over shirt/dress Bra - Perfomed by patient: Thread/unthread left bra strap Bra - Perfomed by helper: Hook/unhook bra (pull down sports bra), Thread/unthread right bra strap Pull over shirt/dress - Perfomed by patient: Thread/unthread right sleeve, Thread/unthread left sleeve, Put head through opening, Pull shirt over trunk Assist Level: Supervision or verbal  cues Function - Lower Body Dressing/Undressing What is the patient wearing?: Pants, Non-skid slipper socks, Underwear Position: Wheelchair/chair at sink Underwear - Performed by patient: Thread/unthread right underwear leg, Thread/unthread left underwear leg, Pull underwear up/down Pants- Performed by patient: Thread/unthread left pants leg, Thread/unthread right pants leg, Pull pants up/down Pants- Performed by helper: Pull pants up/down Non-skid slipper socks- Performed by patient: Don/doff right sock, Don/doff left sock Assist for footwear: Supervision/touching assist Assist for lower body dressing: Touching or steadying assistance (Pt > 75%)  Function - Toileting Toileting steps completed by patient: Adjust clothing prior to toileting, Performs perineal hygiene, Adjust clothing after toileting Toileting steps completed by helper: Adjust clothing prior to toileting, Adjust clothing after toileting, Performs perineal hygiene Assist level: Touching or steadying assistance (Pt.75%)  Function - Toilet Transfers Toilet transfer assistive device: Bedside commode Assist level to toilet: Touching or steadying assistance (Pt > 75%) Assist level from toilet: Touching or steadying assistance (Pt > 75%) Assist level to bedside commode (at bedside): Touching or steadying assistance (Pt > 75%) Assist level from bedside commode (at bedside): Touching or steadying assistance (Pt > 75%)  Function - Chair/bed transfer Chair/bed transfer activity did not occur: Safety/medical concerns Chair/bed transfer method: Stand pivot Chair/bed transfer assist level: Touching or steadying assistance (Pt > 75%) Chair/bed transfer assistive device: Walker Chair/bed transfer details: Verbal cues for precautions/safety, Tactile cues for sequencing, Tactile cues for weight shifting, Tactile cues for posture  Function - Locomotion: Wheelchair Will patient use wheelchair at discharge?: Yes Type: Manual Wheelchair  activity did not occur: Safety/medical concerns (decreased activity tolerance, unstable vitals (HR and SpO2)) Wheel 50 feet with 2 turns activity did not occur: Safety/medical concerns Wheel 150 feet activity did not occur: Safety/medical concerns Turns around,maneuvers to table,bed, and toilet,negotiates 3% grade,maneuvers on rugs and over doorsills: No Function - Locomotion: Ambulation Ambulation activity did not occur: Refused (pain) Assistive device: Walker-rolling Max distance: 10 Assist level: Touching or steadying assistance (Pt > 75%) Assist level: Touching or steadying assistance (Pt > 75%) Walk 50 feet with 2 turns activity did not occur: Safety/medical concerns (decreased activity tolerance, unstable vitals (HR and SpO2)) Walk 150 feet activity did not occur: Safety/medical concerns Walk 10 feet on uneven surfaces activity did not occur: Safety/medical concerns  Function - Comprehension Comprehension: Auditory Comprehension assist level: Understands complex 90% of the time/cues 10% of the time  Function - Expression Expression: Nonverbal Expression assist level: Expresses complex 90% of the time/cues < 10% of the time  Function - Social Interaction Social Interaction assist level: Interacts appropriately with  others - No medications needed.  Function - Problem Solving Problem solving assist level: Solves complex 90% of the time/cues < 10% of the time  Function - Memory Memory assist level: Recognizes or recalls 90% of the time/requires cueing < 10% of the time Patient normally able to recall (first 3 days only): Current season, Staff names and faces, That he or she is in a hospital  Medical Problem List and Plan: 1. Deconditioning secondary to aortic root repair with postoperative cardiac arrest 2. Replacement of Ascending aorta/Wheat procedure/DVT Prophylaxis/Anticoagulation: Pharmaceutical: Coumadin CIR PT, OT, SLP 3. Pain Management: tylenol prn 4. Mood: LCSW to  follow for evaluation and support.  5. Neuropsych: This patient is not fullycapable of making decisions on hisown behalf. 6. Skin/Wound Care: routine pressure relief measures.  7. Fluids/Electrolytes/Nutrition: Strict I/O. Check daily weights. Check lytes in am.  8.Chronic systolic CHF with Hypotension: SBP 90- 101 during exam. No BB due to advanced HF/low EF and No ACE/ARB due to labile BP. 9. Abnormal LFTs: will continue to monitor.No statins.  10. Leucocytosis: Monitor for signs of infections. Diarrhea resolved. 11. ABLA: Will recheck in am.  12. VT arrest s/p DCCV X 3 t: On amiodarone 200 mg daily. Continue to monitor LFTs. Left Vest in place.No CP 13. Dysphagia: continue dysphagia 3, nectar liquids with aspiration precautions.  14. Hypoxia: Continues to be limited by DOE/Hypoxia. Continue nebs tid. Encourage IS while awake--wean as tolerated. Fluid overload has contributed to SOB, clears with addn'l lasix LOS (Days) 4 A FACE TO FACE EVALUATION WAS PERFORMED  Monroe Toure E 05/27/2017, 8:32 AM

## 2017-05-27 NOTE — Progress Notes (Signed)
ANTICOAGULATION CONSULT NOTE - Follow Up Consult  Pharmacy Consult for coumadin Indication: mechanical aortic valve  Allergies  Allergen Reactions  . Morphine And Related Itching  . No Known Allergies     Patient Measurements: Weight: 140 lb 3.4 oz (63.6 kg) Heparin Dosing Weight:   Vital Signs: Temp: 98.1 F (36.7 C) (07/16 0500) Temp Source: Oral (07/16 0500) BP: 116/63 (07/16 0500) Pulse Rate: 87 (07/16 0500)  Labs:  Recent Labs  05/25/17 0453 05/26/17 0523 05/27/17 0446  LABPROT 34.9* 39.9* 37.7*  INR 3.38 3.98 3.71  CREATININE 0.73  --   --     Estimated Creatinine Clearance: 75.1 mL/min (by C-G formula based on SCr of 0.73 mg/dL).   Medications:  Scheduled:  . amiodarone  200 mg Oral Daily  . feeding supplement (PRO-STAT SUGAR FREE 64)  30 mL Oral BID  . levalbuterol  0.63 mg Nebulization TID  . losartan  12.5 mg Oral Daily  . magic mouthwash  5 mL Oral TID  . pantoprazole sodium  40 mg Oral Daily  . potassium chloride  20 mEq Oral BID  . sodium chloride flush  10-40 mL Intracatheter Q12H  . traMADol  50 mg Oral Q6H  . Warfarin - Pharmacist Dosing Inpatient   Does not apply q1800   Infusions:    Assessment: 71 yo male with mechanical aortic valve is currently on supra therapeutic coumadin.  INR is down to 3.71.  Goal of Therapy:  INR 2.5-3.5 Monitor platelets by anticoagulation protocol: Yes   Plan:  - coumadin 0.5 mg po x1 - INR in am  Jack Joseph, Tsz-Yin 05/27/2017,8:02 AM

## 2017-05-27 NOTE — Progress Notes (Signed)
Occupational Therapy Session Note  Patient Details  Name: Jack Joseph MRN: [id number] Date of Birth: 1946-11-02  Today's Date: 05/27/2017 OT Individual Time: 1500-1540 OT Individual Time Calculation (min): 40 min    Short Term Goals: Week 1:  OT Short Term Goal 1 (Week 1): Pt will complete 1/3 components of toileting  OT Short Term Goal 2 (Week 1): Pt will initiate 2 rest breaks during bathing for energy conservation  OT Short Term Goal 3 (Week 1): Pt will exhibit increased safety awareness by adhering to sternal precautions during dressing with min vcs   Skilled Therapeutic Interventions/Progress Updates:    Pt completed supine to sit EOB with supervision.  He then donned shoes with setup as well before standing with the RW and ambulating over to the sink with min assist.  Pt stood for shaving task and cleaning razor for 18 mins without rest break, and close supervision for safety.  Once he finished he ambulated to the elevated toilet with min guard assist as well and completed toileting tasks with min assist secondary to lowered tub.  Min instructional cueing throughout session in order to maintain sternal precautions during sit to stand transitions.  Pt completed session sitting in wheelchair with call button and phone in reach.    Therapy Documentation Precautions:  Precautions Precautions: Fall, Sternal Precaution Comments: lifevest Restrictions Weight Bearing Restrictions: No  Pain:  No report of pain noted  ADL: See Function Navigator for Current Functional Status.   Therapy/Group: Individual Therapy  Becca Bayne OTR/L 05/27/2017, 4:02 PM

## 2017-05-28 ENCOUNTER — Inpatient Hospital Stay (HOSPITAL_COMMUNITY): Payer: Medicare Other | Admitting: Occupational Therapy

## 2017-05-28 ENCOUNTER — Inpatient Hospital Stay (HOSPITAL_COMMUNITY): Payer: Medicare Other | Admitting: Physical Therapy

## 2017-05-28 ENCOUNTER — Inpatient Hospital Stay (HOSPITAL_COMMUNITY): Payer: Medicare Other | Admitting: Speech Pathology

## 2017-05-28 DIAGNOSIS — R791 Abnormal coagulation profile: Secondary | ICD-10-CM | POA: Insufficient documentation

## 2017-05-28 LAB — PROTIME-INR
INR: 3.42
Prothrombin Time: 35.3 seconds — ABNORMAL HIGH (ref 11.4–15.2)

## 2017-05-28 MED ORDER — WARFARIN SODIUM 2 MG PO TABS
1.0000 mg | ORAL_TABLET | Freq: Once | ORAL | Status: AC
  Administered 2017-05-28: 1 mg via ORAL
  Filled 2017-05-28: qty 0.5

## 2017-05-28 MED ORDER — LEVALBUTEROL HCL 0.63 MG/3ML IN NEBU
0.6300 mg | INHALATION_SOLUTION | Freq: Two times a day (BID) | RESPIRATORY_TRACT | Status: DC
  Administered 2017-05-28 – 2017-05-29 (×3): 0.63 mg via RESPIRATORY_TRACT
  Filled 2017-05-28 (×3): qty 3

## 2017-05-28 NOTE — Progress Notes (Signed)
Occupational Therapy Session Note  Patient Details  Name: Jack Joseph MRN: [id number] Date of Birth: 1946-03-21  Today's Date: 05/28/2017 OT Individual Time: 7943-2761 OT Individual Time Calculation (min): 75 min    Short Term Goals: Week 1:  OT Short Term Goal 1 (Week 1): Pt will complete 1/3 components of toileting  OT Short Term Goal 2 (Week 1): Pt will initiate 2 rest breaks during bathing for energy conservation  OT Short Term Goal 3 (Week 1): Pt will exhibit increased safety awareness by adhering to sternal precautions during dressing with min vcs   Skilled Therapeutic Interventions/Progress Updates:    Pt completed bathing and dressing sit to stand at the sink this session.  Oxygen sats at rest on 2Ls nasal cannula were at 99%.  On room air throughout the entire session sats were 92% or greater on RA.  He was able to gather items needed for bathing and dressing with min guard assist, ambulating without assistive device.  He did not want to attempt washing his chest and abdomen during session but completed washing all other areas.  Dressing completed at supervision level as well sit to stand.  Had pt ambulate to the tub/shower room and practice stepping over the edge of the tub.  Min guard assist to complete this with use of the walls for UE support.  Last part of session had pt complete activities on balance with Berg Balance score at 44/56 indicative of balance issues.  He demonstrated significant difficulty with standing in tandem, tapping step, and trying to stand on one foot.  Returned to room at end of session.  Educated pt's spouse on the need to measure the height of his toilet at home so we could practice sit to stand from lower surface.  Pt left in bedside recliner with spouse present.     Therapy Documentation Precautions:  Precautions Precautions: Fall, Sternal Precaution Comments: lifevest Restrictions Weight Bearing Restrictions: No  Pain: Pain Assessment Pain  Assessment: No/denies pain Pain Score: 0-No pain Pain Type: Acute pain Pain Location: Head Pain Orientation: Right;Left Pain Descriptors / Indicators: Headache Pain Onset: Gradual Pain Intervention(s): Medication (See eMAR) ADL: See Function Navigator for Current Functional Status.   Therapy/Group: Individual Therapy  Kahealani Yankovich OTR/L 05/28/2017, 3:43 PM

## 2017-05-28 NOTE — Progress Notes (Addendum)
Physical Therapy Session Note  Patient Details  Name: Jack Joseph MRN: [id number] Date of Birth: 1946/10/02  Today's Date: 05/28/2017 PT Individual Time: 1003-1059 PT Individual Time Calculation (min): 56 min   Short Term Goals: Week 1:  PT Short Term Goal 1 (Week 1): Pt will perform bed mobility with supervision  PT Short Term Goal 2 (Week 1): Pt will ambulate 150 ft with min assist and LRAD PT Short Term Goal 3 (Week 1): Pt will propel w/c 150 ft with supervision   Skilled Therapeutic Interventions/Progress Updates:   Pt in bed upon arrival and agreeable to therapy. Pt transferred supine to EOB w/ supervision and ambulated to/from toilet w/ touching/steady assist. Pt able to complete pericare and personal hygiene independently, urine and stool output documented in flowsheets.   Pt ambulated room to therapy gym w/ touching/steady assist, 246f.   NuStep to increase endurance, 5 min x3 bouts w/ seated rest secondary to moderate increase in fatigue and work of breathing, O2 sat 99%. L3, 722 steps total.   Pt ambulated 500 ft w/ touching/steady assist and verbal cues to increase BOS and step length, no LOB but slow gait speed. Moderate increase in fatigue and work of breathing, resolves with 2-3 min of seated rest.   Ended session in room in w/c, call bell within reach and all needs met.   Therapy Documentation Precautions:  Precautions Precautions: Fall, Sternal Precaution Comments: lifevest Restrictions Weight Bearing Restrictions: No Vital Signs: Therapy Vitals Temp: 98 F (36.7 C) Temp Source: Oral Pulse Rate: 84 Resp: 18 BP: (!) 105/59 Patient Position (if appropriate): Lying Oxygen Therapy SpO2: 94 % O2 Device: Nasal Cannula O2 Flow Rate (L/min): 2 L/min Pain: Pain Assessment Pain Assessment: No/denies pain   See Function Navigator for Current Functional Status.   Therapy/Group: Individual Therapy  Carlicia Leavens K Arnette 05/28/2017, 10:46 AM

## 2017-05-28 NOTE — Progress Notes (Signed)
Speech Language Pathology Daily Session Note  Patient Details  Name: Jack Joseph MRN: [id number] Date of Birth: 1946-07-01  Today's Date: 05/28/2017 SLP Individual Time: 0805-0900 SLP Individual Time Calculation (min): 55 min  Short Term Goals: Week 1: SLP Short Term Goal 1 (Week 1): Pt will consume current diet without overt s/s of aspiration and supervision cues for use of compensatory swallow strategies.  SLP Short Term Goal 2 (Week 1): Pt will consume trials of ice chips without overt s/s of aspiration to demonstrate readiness for instrumental study. SLP Short Term Goal 3 (Week 1): Pt will use speech intelligibility strategies with Min A to increase vocal intensity to achieve ~ 75% intelligiblity at the sentence level.  SLP Short Term Goal 4 (Week 1): Patient will perform EMST for 25 repetitions with supervision verbal cues and a self-percieved effort level of 7/10.   Skilled Therapeutic Interventions:  Pt was seen for skilled ST targeting dysphagia goals.  SLP facilitated the session with trials of water following oral care to continue working towards diet progression.  Pt demonstrated intermittent coughing throughout session both with and without POs.  Suspect coughing to be more related to congestion versus POs.  Continue trials of thin liquids with SLP only for 1-2 additional sessions prior to repeat objective assessment.  Pt was able to complete 25 repetitions of EMST with device with mod I and a self perceived effort level of 3 out of 10.  Pt also consumed his currently prescribed diet with mod I use of swallowing precautions and no overt s/s of aspiration.  Throughout session pt needed overall supervision verbal cues to increase vocal intensity to achieve intelligibility at the conversational level.  Pt was left in bed with bed alarm set and call bell within reach.  Continue per current plan of care.       Function:  Eating Eating     Eating Assist Level: More than reasonable  amount of time           Cognition Comprehension Comprehension assist level: Understands complex 90% of the time/cues 10% of the time  Expression   Expression assist level: Expresses basic 90% of the time/requires cueing < 10% of the time.  Social Interaction Social Interaction assist level: Interacts appropriately with others - No medications needed.  Problem Solving Problem solving assist level: Solves basic problems with no assist  Memory Memory assist level: Recognizes or recalls 90% of the time/requires cueing < 10% of the time    Pain Pain Assessment Pain Assessment: No/denies pain Faces Pain Scale: No hurt  Therapy/Group: Individual Therapy  Ariez Neilan, Melanee Spry 05/28/2017, 8:58 AM

## 2017-05-28 NOTE — Plan of Care (Signed)
Problem: RH Toilet Transfers Goal: LTG Patient will perform toilet transfers w/assist (OT) LTG: Patient will perform toilet transfers with assist, with/without cues using equipment (OT)  Goals updated based on progress.

## 2017-05-28 NOTE — Progress Notes (Signed)
ANTICOAGULATION CONSULT NOTE - Follow Up Consult  Pharmacy Consult for coumadin Indication: mechanical aortic valve  Allergies  Allergen Reactions  . Morphine And Related Itching  . No Known Allergies     Patient Measurements: Weight: 132 lb (59.9 kg) Heparin Dosing Weight:   Vital Signs: Temp: 98 F (36.7 C) (07/17 0711) Temp Source: Oral (07/17 0711) BP: 105/59 (07/17 0711) Pulse Rate: 84 (07/17 0711)  Labs:  Recent Labs  05/26/17 0523 05/27/17 0446 05/27/17 0752 05/27/17 0902 05/28/17 0342  HGB  --   --  10.3*  --   --   HCT  --   --  33.3*  --   --   PLT  --   --  365  --   --   LABPROT 39.9* 37.7*  --   --  35.3*  INR 3.98 3.71  --   --  3.42  CREATININE  --   --   --  0.76  --     Estimated Creatinine Clearance: 70.7 mL/min (by C-G formula based on SCr of 0.76 mg/dL).   Medications:  Scheduled:  . amiodarone  200 mg Oral Daily  . feeding supplement (PRO-STAT SUGAR FREE 64)  30 mL Oral BID  . levalbuterol  0.63 mg Nebulization TID  . losartan  12.5 mg Oral Daily  . magic mouthwash  5 mL Oral TID  . pantoprazole sodium  40 mg Oral Daily  . sodium chloride flush  10-40 mL Intracatheter Q12H  . traMADol  50 mg Oral Q6H  . traZODone  25 mg Oral QHS  . Warfarin - Pharmacist Dosing Inpatient   Does not apply q1800   Infusions:    Assessment: 71 yo male with mechanical aortic valve is currently on therapeutic coumadin.  INR is down to 3.42.  Goal of Therapy:  INR 2.5-3.5 Monitor platelets by anticoagulation protocol: Yes   Plan:  - coumadin 1 mg po x1 - INR in am  Tahja Liao, Tsz-Yin 05/28/2017,8:02 AM

## 2017-05-28 NOTE — Progress Notes (Signed)
Subjective/Complaints: Pt seen working with SLP this AM.  He had a cough with swallowing.  He slept well overnight.    ROS: Denies CP, SOB, N/V/D  Objective: Vital Signs: Blood pressure (!) 105/59, pulse 84, temperature 98 F (36.7 C), temperature source Oral, resp. rate 18, weight 59.9 kg (132 lb), SpO2 94 %. No results found. Results for orders placed or performed during the hospital encounter of 05/23/17 (from the past 72 hour(s))  Protime-INR     Status: Abnormal   Collection Time: 05/26/17  5:23 AM  Result Value Ref Range   Prothrombin Time 39.9 (H) 11.4 - 15.2 seconds   INR 3.98   Occult blood card to lab, stool RN will collect     Status: None   Collection Time: 05/26/17  6:58 AM  Result Value Ref Range   Fecal Occult Bld NEGATIVE NEGATIVE  Protime-INR     Status: Abnormal   Collection Time: 05/27/17  4:46 AM  Result Value Ref Range   Prothrombin Time 37.7 (H) 11.4 - 15.2 seconds   INR 3.71   CBC with Differential/Platelet     Status: Abnormal   Collection Time: 05/27/17  7:52 AM  Result Value Ref Range   WBC 7.8 4.0 - 10.5 K/uL   RBC 3.55 (L) 4.22 - 5.81 MIL/uL   Hemoglobin 10.3 (L) 13.0 - 17.0 g/dL   HCT 33.3 (L) 39.0 - 52.0 %   MCV 93.8 78.0 - 100.0 fL   MCH 29.0 26.0 - 34.0 pg   MCHC 30.9 30.0 - 36.0 g/dL   RDW 15.4 11.5 - 15.5 %   Platelets 365 150 - 400 K/uL   Neutrophils Relative % 72 %   Neutro Abs 5.6 1.7 - 7.7 K/uL   Lymphocytes Relative 17 %   Lymphs Abs 1.4 0.7 - 4.0 K/uL   Monocytes Relative 8 %   Monocytes Absolute 0.6 0.1 - 1.0 K/uL   Eosinophils Relative 2 %   Eosinophils Absolute 0.2 0.0 - 0.7 K/uL   Basophils Relative 1 %   Basophils Absolute 0.0 0.0 - 0.1 K/uL  Basic metabolic panel     Status: Abnormal   Collection Time: 05/27/17  9:02 AM  Result Value Ref Range   Sodium 136 135 - 145 mmol/L   Potassium 4.4 3.5 - 5.1 mmol/L   Chloride 99 (L) 101 - 111 mmol/L   CO2 30 22 - 32 mmol/L   Glucose, Bld 112 (H) 65 - 99 mg/dL   BUN 16 6 - 20  mg/dL   Creatinine, Ser 0.76 0.61 - 1.24 mg/dL   Calcium 8.6 (L) 8.9 - 10.3 mg/dL   GFR calc non Af Amer >60 >60 mL/min   GFR calc Af Amer >60 >60 mL/min    Comment: (NOTE) The eGFR has been calculated using the CKD EPI equation. This calculation has not been validated in all clinical situations. eGFR's persistently <60 mL/min signify possible Chronic Kidney Disease.    Anion gap 7 5 - 15  Protime-INR     Status: Abnormal   Collection Time: 05/28/17  3:42 AM  Result Value Ref Range   Prothrombin Time 35.3 (H) 11.4 - 15.2 seconds   INR 3.42     Gen: NAD. Vital signs reviewed.  HEENT: hoarse voice. Normocephalic, atraumatic Cardio: RRR and no JVD. Resp: CTA B/L and Unlabored. +[place] GI: BS positive and ND Musc/Skel:  No edema, no tenderness. Neuro: Alert Motor 4+/5 throughout.  Skin:   Intact. Warm and dry.  Assessment/Plan: 1. Functional deficits secondary to Debility, s/p cardiac arrest which require 3+ hours per day of interdisciplinary therapy in a comprehensive inpatient rehab setting. Physiatrist is providing close team supervision and 24 hour management of active medical problems listed below. Physiatrist and rehab team continue to assess barriers to discharge/monitor patient progress toward functional and medical goals. FIM: Function - Bathing Position: Sitting EOB Body parts bathed by patient: Right arm, Left arm, Chest, Abdomen, Right upper leg, Left upper leg, Right lower leg, Left lower leg, Front perineal area, Buttocks Body parts bathed by helper: Back Bathing not applicable: Front perineal area, Buttocks (completed by nursing) Assist Level: Touching or steadying assistance(Pt > 75%)  Function- Upper Body Dressing/Undressing What is the patient wearing?: Pull over shirt/dress Bra - Perfomed by patient: Thread/unthread left bra strap Bra - Perfomed by helper: Hook/unhook bra (pull down sports bra), Thread/unthread right bra strap Pull over shirt/dress - Perfomed by  patient: Thread/unthread right sleeve, Thread/unthread left sleeve, Put head through opening, Pull shirt over trunk Assist Level: Supervision or verbal cues Function - Lower Body Dressing/Undressing What is the patient wearing?: Pants, Non-skid slipper socks, Underwear, Ted Hose Position: Sitting EOB Underwear - Performed by patient: Thread/unthread right underwear leg, Thread/unthread left underwear leg, Pull underwear up/down Pants- Performed by patient: Thread/unthread left pants leg, Thread/unthread right pants leg, Pull pants up/down Pants- Performed by helper: Pull pants up/down Non-skid slipper socks- Performed by patient: Don/doff right sock, Don/doff left sock TED Hose - Performed by helper: Don/doff right TED hose, Don/doff left TED hose Assist for footwear: Supervision/touching assist Assist for lower body dressing: Touching or steadying assistance (Pt > 75%)  Function - Toileting Toileting steps completed by patient: Adjust clothing prior to toileting, Performs perineal hygiene, Adjust clothing after toileting Toileting steps completed by helper: Adjust clothing prior to toileting, Performs perineal hygiene, Adjust clothing after toileting (per [place], NT) Assist level: Touching or steadying assistance (Pt.75%)  Function - Toilet Transfers Toilet transfer assistive device: Bedside commode Assist level to toilet: Touching or steadying assistance (Pt > 75%) Assist level from toilet: Touching or steadying assistance (Pt > 75%) Assist level to bedside commode (at bedside): Touching or steadying assistance (Pt > 75%) Assist level from bedside commode (at bedside): Touching or steadying assistance (Pt > 75%)  Function - Chair/bed transfer Chair/bed transfer activity did not occur: Safety/medical concerns Chair/bed transfer method: Stand pivot Chair/bed transfer assist level: Touching or steadying assistance (Pt > 75%) Chair/bed transfer assistive device: Walker Chair/bed  transfer details: Verbal cues for precautions/safety, Tactile cues for sequencing, Tactile cues for weight shifting, Tactile cues for posture  Function - Locomotion: Wheelchair Will patient use wheelchair at discharge?: Yes Type: Manual Wheelchair activity did not occur: Safety/medical concerns (decreased activity tolerance, unstable vitals (HR and SpO2)) Assist Level: Dependent (Pt equals 0%) (for transport) Wheel 50 feet with 2 turns activity did not occur: Safety/medical concerns Wheel 150 feet activity did not occur: Safety/medical concerns Turns around,maneuvers to table,bed, and toilet,negotiates 3% grade,maneuvers on rugs and over doorsills: No Function - Locomotion: Ambulation Ambulation activity did not occur: Refused (pain) Assistive device: Walker-rolling Max distance: 150 Assist level: Touching or steadying assistance (Pt > 75%) Assist level: Touching or steadying assistance (Pt > 75%) Walk 50 feet with 2 turns activity did not occur: Safety/medical concerns (decreased activity tolerance, unstable vitals (HR and SpO2)) Assist level: Touching or steadying assistance (Pt > 75%) Walk 150 feet activity did not occur: Safety/medical concerns Assist level: Touching or steadying assistance (Pt >  75%) Walk 10 feet on uneven surfaces activity did not occur: Safety/medical concerns Assist level: Touching or steadying assistance (Pt > 75%)  Function - Comprehension Comprehension: Auditory Comprehension assist level: Understands complex 90% of the time/cues 10% of the time  Function - Expression Expression: Verbal Expression assist level: Expresses complex 90% of the time/cues < 10% of the time  Function - Social Interaction Social Interaction assist level: Interacts appropriately with others - No medications needed.  Function - Problem Solving Problem solving assist level: Solves complex 90% of the time/cues < 10% of the time  Function - Memory Memory assist level: Recognizes  or recalls 90% of the time/requires cueing < 10% of the time Patient normally able to recall (first 3 days only): Current season, Staff names and faces, That he or she is in a hospital  Medical Problem List and Plan: 1. Deconditioningsecondary to aortic root repair with postoperative cardiac arrest  Cont CIR 2. Replacement of Ascending aorta/Wheat procedure/DVT Prophylaxis/Anticoagulation: Pharmaceutical: Coumadin             INR supra therapeutic on 7/17 3. Pain Management: tylenol prn 4. Mood: LCSW to follow for evaluation and support.  5. Neuropsych: This patient is ?fullycapable of making decisions on hisown behalf. 6. Skin/Wound Care: routine pressure relief measures.  7. Fluids/Electrolytes/Nutrition: Strict I/Os. Check daily weights.              BMP within acceptable range on 7/16 8.Chronic systolic CHF with Hypotension: No BB due to advanced HF/low EF and No ACE/ARB due to labile BP. There were no vitals filed for this visit. 9. Abnormal LFTs: Resolved             will continue to monitor.No statins.  10. Leucocytosis: Resolved             Monitor for signs of infections. Diarrhea resolved. 11. ABLA:              Hb 10.3 on 7/16             Cont to monitor             Hemoccult neg 12. VT arrest s/p DCCV X 3 t: On amiodarone 200 mg daily. Continue to monitor LFTs. Left Vest in place.  13. Dysphagia: continue dysphagia 3, nectar liquids with aspiration precautions.              Advance diet as tolerated 14. Hypoxia: Continues to be limited by DOE/Hypoxia. Continue nebs tid. Encourage IS while awake             Wean supplemental O2 as tolerated             CXR 7/12 reviewed, b/l pleural effusions - Cards consulted, Lasix initiated.  Appreciate recs 15. Hypoalbuminemia             Supplement initiated 7/13  LOS (Days) 5 A FACE TO FACE EVALUATION WAS PERFORMED  Cleotis Sparr Lorie Phenix 05/28/2017, 8:53 AM

## 2017-05-29 ENCOUNTER — Inpatient Hospital Stay (HOSPITAL_COMMUNITY): Payer: Medicare Other | Admitting: *Deleted

## 2017-05-29 ENCOUNTER — Ambulatory Visit: Payer: Medicare Other | Admitting: Cardiology

## 2017-05-29 ENCOUNTER — Inpatient Hospital Stay (HOSPITAL_COMMUNITY): Payer: Medicare Other | Admitting: Speech Pathology

## 2017-05-29 ENCOUNTER — Inpatient Hospital Stay (HOSPITAL_COMMUNITY): Payer: Medicare Other

## 2017-05-29 ENCOUNTER — Inpatient Hospital Stay (HOSPITAL_COMMUNITY): Payer: Medicare Other | Admitting: Occupational Therapy

## 2017-05-29 ENCOUNTER — Encounter (HOSPITAL_COMMUNITY): Payer: Self-pay | Admitting: *Deleted

## 2017-05-29 DIAGNOSIS — R0902 Hypoxemia: Secondary | ICD-10-CM

## 2017-05-29 DIAGNOSIS — R1312 Dysphagia, oropharyngeal phase: Secondary | ICD-10-CM

## 2017-05-29 LAB — BASIC METABOLIC PANEL
Anion gap: 8 (ref 5–15)
BUN: 21 mg/dL — AB (ref 6–20)
CALCIUM: 8.5 mg/dL — AB (ref 8.9–10.3)
CHLORIDE: 99 mmol/L — AB (ref 101–111)
CO2: 29 mmol/L (ref 22–32)
CREATININE: 0.76 mg/dL (ref 0.61–1.24)
GFR calc non Af Amer: >60 mL/min (ref >60)
Glucose, Bld: 91 mg/dL (ref 65–99)
Potassium: 3.7 mmol/L (ref 3.5–5.1)
SODIUM: 136 mmol/L (ref 135–145)

## 2017-05-29 LAB — PROTIME-INR
INR: 2.75
PROTHROMBIN TIME: 29.6 s — AB (ref 11.4–15.2)

## 2017-05-29 MED ORDER — WARFARIN SODIUM 2 MG PO TABS
1.0000 mg | ORAL_TABLET | Freq: Once | ORAL | Status: AC
  Administered 2017-05-29: 1 mg via ORAL
  Filled 2017-05-29: qty 0.5

## 2017-05-29 NOTE — Progress Notes (Signed)
ANTICOAGULATION CONSULT NOTE - Follow Up Consult  Pharmacy Consult for Coumadin Indication: mechanical aortic valve  Allergies  Allergen Reactions  . Morphine And Related Itching  . No Known Allergies     Patient Measurements: Weight: 132 lb 4.4 oz (60 kg) Heparin Dosing Weight:   Vital Signs: Temp: 97.7 F (36.5 C) (07/18 0530) Temp Source: Oral (07/18 0530) BP: 105/67 (07/18 0530) Pulse Rate: 77 (07/18 0530)  Labs:  Recent Labs  05/27/17 0446 05/27/17 0752 05/27/17 0902 05/28/17 0342 05/29/17 0458  HGB  --  10.3*  --   --   --   HCT  --  33.3*  --   --   --   PLT  --  365  --   --   --   LABPROT 37.7*  --   --  35.3* 29.6*  INR 3.71  --   --  3.42 2.75  CREATININE  --   --  0.76  --  0.76    Estimated Creatinine Clearance: 70.8 mL/min (by C-G formula based on SCr of 0.76 mg/dL).   Medications:  Scheduled:  . amiodarone  200 mg Oral Daily  . feeding supplement (PRO-STAT SUGAR FREE 64)  30 mL Oral BID  . levalbuterol  0.63 mg Nebulization BID  . losartan  12.5 mg Oral Daily  . magic mouthwash  5 mL Oral TID  . pantoprazole sodium  40 mg Oral Daily  . sodium chloride flush  10-40 mL Intracatheter Q12H  . traMADol  50 mg Oral Q6H  . traZODone  25 mg Oral QHS  . Warfarin - Pharmacist Dosing Inpatient   Does not apply q1800   Infusions:   Assessment: 71 yo male with mechanical aortic valve is currently on therapeutic coumadin. INR is down to 2.75.  PTA dose: 5mg  daily, except 2.5mg  on Mondays and Thursdays  Goal of Therapy:  INR 2.5-3.5 Monitor platelets by anticoagulation protocol: Yes   Plan:  Coumadin 1mg  po x1 INR in AM  Shantara Goosby N. Zigmund Daniel, PharmD PGY1 Pharmacy Resident Pager: [phone]

## 2017-05-29 NOTE — Progress Notes (Signed)
Subjective/Complaints: Pt seen laying in bed this AM.  He slept well overnight and believes he is getting better.  He does not believe he still requires supplemental O2.  ROS: Denies CP, SOB, N/V/D  Objective: Vital Signs: Blood pressure 105/67, pulse 77, temperature 97.7 F (36.5 C), temperature source Oral, resp. rate 16, weight 60 kg (132 lb 4.4 oz), SpO2 97 %. No results found. Results for orders placed or performed during the hospital encounter of 05/23/17 (from the past 72 hour(s))  Protime-INR     Status: Abnormal   Collection Time: 05/27/17  4:46 AM  Result Value Ref Range   Prothrombin Time 37.7 (H) 11.4 - 15.2 seconds   INR 3.71   CBC with Differential/Platelet     Status: Abnormal   Collection Time: 05/27/17  7:52 AM  Result Value Ref Range   WBC 7.8 4.0 - 10.5 K/uL   RBC 3.55 (L) 4.22 - 5.81 MIL/uL   Hemoglobin 10.3 (L) 13.0 - 17.0 g/dL   HCT 33.3 (L) 39.0 - 52.0 %   MCV 93.8 78.0 - 100.0 fL   MCH 29.0 26.0 - 34.0 pg   MCHC 30.9 30.0 - 36.0 g/dL   RDW 15.4 11.5 - 15.5 %   Platelets 365 150 - 400 K/uL   Neutrophils Relative % 72 %   Neutro Abs 5.6 1.7 - 7.7 K/uL   Lymphocytes Relative 17 %   Lymphs Abs 1.4 0.7 - 4.0 K/uL   Monocytes Relative 8 %   Monocytes Absolute 0.6 0.1 - 1.0 K/uL   Eosinophils Relative 2 %   Eosinophils Absolute 0.2 0.0 - 0.7 K/uL   Basophils Relative 1 %   Basophils Absolute 0.0 0.0 - 0.1 K/uL  Basic metabolic panel     Status: Abnormal   Collection Time: 05/27/17  9:02 AM  Result Value Ref Range   Sodium 136 135 - 145 mmol/L   Potassium 4.4 3.5 - 5.1 mmol/L   Chloride 99 (L) 101 - 111 mmol/L   CO2 30 22 - 32 mmol/L   Glucose, Bld 112 (H) 65 - 99 mg/dL   BUN 16 6 - 20 mg/dL   Creatinine, Ser 0.76 0.61 - 1.24 mg/dL   Calcium 8.6 (L) 8.9 - 10.3 mg/dL   GFR calc non Af Amer >60 >60 mL/min   GFR calc Af Amer >60 >60 mL/min    Comment: (NOTE) The eGFR has been calculated using the CKD EPI equation. This calculation has not been  validated in all clinical situations. eGFR's persistently <60 mL/min signify possible Chronic Kidney Disease.    Anion gap 7 5 - 15  Protime-INR     Status: Abnormal   Collection Time: 05/28/17  3:42 AM  Result Value Ref Range   Prothrombin Time 35.3 (H) 11.4 - 15.2 seconds   INR 3.42   Protime-INR     Status: Abnormal   Collection Time: 05/29/17  4:58 AM  Result Value Ref Range   Prothrombin Time 29.6 (H) 11.4 - 15.2 seconds   INR 7.74   Basic metabolic panel     Status: Abnormal   Collection Time: 05/29/17  4:58 AM  Result Value Ref Range   Sodium 136 135 - 145 mmol/L   Potassium 3.7 3.5 - 5.1 mmol/L   Chloride 99 (L) 101 - 111 mmol/L   CO2 29 22 - 32 mmol/L   Glucose, Bld 91 65 - 99 mg/dL   BUN 21 (H) 6 - 20 mg/dL   Creatinine, Ser  0.76 0.61 - 1.24 mg/dL   Calcium 8.5 (L) 8.9 - 10.3 mg/dL   GFR calc non Af Amer >60 >60 mL/min   GFR calc Af Amer >60 >60 mL/min    Comment: (NOTE) The eGFR has been calculated using the CKD EPI equation. This calculation has not been validated in all clinical situations. eGFR's persistently <60 mL/min signify possible Chronic Kidney Disease.    Anion gap 8 5 - 15    Gen: NAD. Vital signs reviewed.  HEENT: hoarse voice. Normocephalic, atraumatic Cardio: RRR and no JVD. Resp: CTA B/L and Unlabored. +[place] GI: BS positive and ND Musc/Skel:  No edema, no tenderness. Neuro: Alert Motor 4+/5 throughout.  Skin:   Intact. Warm and dry. Psych: Normal mood and affect.   Assessment/Plan: 1. Functional deficits secondary to Debility, s/p cardiac arrest which require 3+ hours per day of interdisciplinary therapy in a comprehensive inpatient rehab setting. Physiatrist is providing close team supervision and 24 hour management of active medical problems listed below. Physiatrist and rehab team continue to assess barriers to discharge/monitor patient progress toward functional and medical goals. FIM: Function - Bathing Position: Wheelchair/chair at  sink Body parts bathed by patient: Right arm, Left arm, Front perineal area, Buttocks, Right upper leg, Left upper leg, Right lower leg, Left lower leg Body parts bathed by helper: Back Bathing not applicable: Chest, Abdomen Assist Level: Supervision or verbal cues  Function- Upper Body Dressing/Undressing What is the patient wearing?: Pull over shirt/dress Bra - Perfomed by patient: Thread/unthread left bra strap Bra - Perfomed by helper: Hook/unhook bra (pull down sports bra), Thread/unthread right bra strap Pull over shirt/dress - Perfomed by patient: Thread/unthread right sleeve, Thread/unthread left sleeve, Put head through opening, Pull shirt over trunk Assist Level: Supervision or verbal cues Function - Lower Body Dressing/Undressing What is the patient wearing?: Underwear, Pants, Socks, Shoes Position: Wheelchair/chair at [employer] - Performed by patient: Thread/unthread right underwear leg, Thread/unthread left underwear leg, Pull underwear up/down Pants- Performed by patient: Thread/unthread left pants leg, Thread/unthread right pants leg, Pull pants up/down Pants- Performed by helper: Pull pants up/down Non-skid slipper socks- Performed by patient: Don/doff right sock, Don/doff left sock Shoes - Performed by patient: Don/doff right shoe, Don/doff left shoe, Fasten right, Fasten left TED Hose - Performed by helper: Don/doff right TED hose, Don/doff left TED hose Assist for footwear: Supervision/touching assist Assist for lower body dressing: Supervision or verbal cues  Function - Toileting Toileting steps completed by patient: Adjust clothing prior to toileting, Performs perineal hygiene, Adjust clothing after toileting Toileting steps completed by helper: Adjust clothing prior to toileting, Performs perineal hygiene, Adjust clothing after toileting Assist level: Touching or steadying assistance (Pt.75%)  Function - Air cabin crew transfer assistive device:  Bedside commode Assist level to toilet: Touching or steadying assistance (Pt > 75%) Assist level from toilet: Touching or steadying assistance (Pt > 75%) Assist level to bedside commode (at bedside): Touching or steadying assistance (Pt > 75%) Assist level from bedside commode (at bedside): Touching or steadying assistance (Pt > 75%)  Function - Chair/bed transfer Chair/bed transfer activity did not occur: Safety/medical concerns Chair/bed transfer method: Stand pivot Chair/bed transfer assist level: Touching or steadying assistance (Pt > 75%) Chair/bed transfer assistive device: Walker Chair/bed transfer details: Verbal cues for precautions/safety, Tactile cues for sequencing, Tactile cues for weight shifting, Tactile cues for posture  Function - Locomotion: Wheelchair Will patient use wheelchair at discharge?: Yes Type: Manual Wheelchair activity did not occur: Safety/medical concerns (decreased activity  tolerance, unstable vitals (HR and SpO2)) Assist Level: Dependent (Pt equals 0%) (for transport) Wheel 50 feet with 2 turns activity did not occur: Safety/medical concerns Wheel 150 feet activity did not occur: Safety/medical concerns Turns around,maneuvers to table,bed, and toilet,negotiates 3% grade,maneuvers on rugs and over doorsills: No Function - Locomotion: Ambulation Ambulation activity did not occur: Refused (pain) Assistive device: No device Max distance: 500 Assist level: Touching or steadying assistance (Pt > 75%) Assist level: Touching or steadying assistance (Pt > 75%) Walk 50 feet with 2 turns activity did not occur: Safety/medical concerns (decreased activity tolerance, unstable vitals (HR and SpO2)) Assist level: Touching or steadying assistance (Pt > 75%) Walk 150 feet activity did not occur: Safety/medical concerns Assist level: Touching or steadying assistance (Pt > 75%) Walk 10 feet on uneven surfaces activity did not occur: Safety/medical concerns Assist level:  Touching or steadying assistance (Pt > 75%)  Function - Comprehension Comprehension: Auditory Comprehension assist level: Understands complex 90% of the time/cues 10% of the time  Function - Expression Expression: Verbal Expression assist level: Expresses complex 90% of the time/cues < 10% of the time  Function - Social Interaction Social Interaction assist level: Interacts appropriately with others - No medications needed.  Function - Problem Solving Problem solving assist level: Solves basic problems with no assist  Function - Memory Memory assist level: Recognizes or recalls 90% of the time/requires cueing < 10% of the time Patient normally able to recall (first 3 days only): Current season, Staff names and faces  Medical Problem List and Plan: 1. Deconditioningsecondary to aortic root repair with postoperative cardiac arrest  Cont CIR 2. Replacement of Ascending aorta/Wheat procedure/DVT Prophylaxis/Anticoagulation: Pharmaceutical: Coumadin             INR therapeutic on 7/18 3. Pain Management: tylenol prn 4. Mood: LCSW to follow for evaluation and support.  5. Neuropsych: This patient is ?fullycapable of making decisions on hisown behalf. 6. Skin/Wound Care: routine pressure relief measures.  7. Fluids/Electrolytes/Nutrition: Strict I/Os. Check daily weights.              BMP within acceptable range on 7/18 8.Chronic systolic CHF with Hypotension: No BB due to advanced HF/low EF and No ACE/ARB due to labile BP. There were no vitals filed for this visit. 9. Abnormal LFTs: Resolved             will continue to monitor.No statins.  10. Leucocytosis: Resolved             Monitor for signs of infections. Diarrhea resolved. 11. ABLA:              Hb 10.3 on 7/16             Cont to monitor             Hemoccult neg 12. VT arrest s/p DCCV X 3 t: On amiodarone 200 mg daily. Continue to monitor LFTs. Left Vest in place.  13. Dysphagia: dysphagia 3, nectar liquids with  aspiration precautions advanced to regular diet nectar liquids.              Advance diet as tolerated 14. Hypoxia: Continues to be limited by DOE/Hypoxia. Continue nebs tid. Encourage IS while awake             Wean supplemental O2 as tolerated             CXR 7/12 reviewed, b/l pleural effusions - Cards consulted, Lasix initiated.  Appreciate recs 15. Hypoalbuminemia  Supplement initiated 7/13  LOS (Days) 6 A FACE TO FACE EVALUATION WAS PERFORMED  Jack Joseph Jack Joseph 05/29/2017, 7:33 AM

## 2017-05-29 NOTE — Progress Notes (Signed)
Social Work Patient ID: Jack Joseph, male   DOB: Mar 10, 1946, 71 y.o.   MRN: [id number] Sindy Messing Social Worker Signed   Patient Care Conference Date of Service: 05/29/2017  3:09 PM      Hide copied text Hover for attribution information Inpatient RehabilitationTeam Conference and Plan of Care Update Date: 05/29/2017   Time: 2:15 PM      Patient Name: Jack Joseph      Medical Record Number: [id number]  Date of Birth: Sep 24, 1946 Sex: Male         Room/Bed: 4M02C/4M02C-01 Payor Info: Payor: MEDICARE / Plan: MEDICARE PART A AND B / Product Type: *No Product type* /     Admitting Diagnosis: Debility after Cardiac Surgery  Admit Date/Time:  05/23/2017  3:28 PM Admission Comments: No comment available    Primary Diagnosis:  <principal problem not specified> Principal Problem: <principal problem not specified>       Patient Active Problem List    Diagnosis Date Noted  . Hypoxia    . Supratherapeutic INR    . Acute blood loss anemia    . History of cardiac arrest    . Dysphagia    . Hypoalbuminemia due to protein-calorie malnutrition (HCC)    . Pleural effusion    . Debility 05/23/2017  . Atelectasis    . Hyperglycemia    . Delirium    . Complication of chest tube    . Chest tube in place    . Acute encephalopathy    . Essential hypertension    . Dilatation of aorta (HCC)    . Postop check    . Acute respiratory failure with hypoxia (HCC)    . VT (ventricular tachycardia) (HCC)    . 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 Thoracic aortic aneurysm without rupture (HCC) 02/25/2017  . Mass of left lung 02/25/2017  . Acute on chronic systolic (congestive) heart failure (HCC) 02/25/2017  . CHF (congestive heart failure) (HCC) 07/10/2016  . Varicose veins of both lower extremities 07/10/2016  . Erectile dysfunction 07/10/2016  . AVD (aortic valve disease)    . Dilated cardiomyopathy (HCC)    . Hyperlipidemia    . Chronic anticoagulation    . Hx  of replacement of aortic valve 03/15/2011      Expected Discharge Date: Expected Discharge Date: 05/31/17   Team Members Present: Physician leading conference: Dr. Maryla Morrow Social Worker Present: Dossie Der, LCSW Nurse Present: Other (comment) Albertine Patricia) PT Present: Other (comment) Erick Blinks) OT Present: Perrin Maltese, OT SLP Present: Jackalyn Lombard, SLP PPS Coordinator present : Tora Duck, RN, CRRN       Current Status/Progress Goal Weekly Team Focus  Medical     Deconditioning secondary to aortic root repair with postoperative cardiac arrest  Improve mobility, transfers, safety, endurance, hypoxia, dysphagia  See above   Bowel/Bladder     continent  Bowel and bladder         Swallow/Nutrition/ Hydration     Regular, nectar thick liquids; full supervision   mod I   trials of thin liquids to continue working towards repeat objective assessment, IMST and EMST to improve respiratory endurance   ADL's     Supervision for bathing sit to stand as well as dressing.  Supervision for all toileting and shower/tub transfers.  supervision to modified independent  selfcare retraining, balance retraining, neuromuscular re-education, endurance building, pt/family education, DME education   Mobility     supervision with RW ambulation/transfers,  min assist for transfers/ambulation without AD, and stairs  mod I for ambulation, transfers, and bed mobility. Supervision for stairs and car transfer  balance, endurance/activity tolerance, LE strengthening, d/c planning    Communication     supervision for intelligibility   mod I   IMST and EMST; education and carryover of intelligibility strategies    Safety/Cognition/ Behavioral Observations   no unsafe behavior/low bed   supervision  assess safety q shift prn   Pain     headaches when OOB/scheduled tramadol 50mg  q 6hrs   pain less than or equal to 2  assess pain q shift   Skin     mod assist   left chest skin glue, foam to  buttocks-stage II small area of sacrum,   assess skin q shift     Rehab Goals Patient on target to meet rehab goals: Yes *See Care Plan and progress notes for long and short-term goals.      Barriers to Discharge   Current Status/Progress Possible Resolutions Date Resolved   Physician     New oxygen  dysphagia, ABLA  See above  Therapies, wean supplemental O2, advance diet as tolerated, follow labs   7/19   Nursing                 PT                    OT                 SLP            SW              Discharge Planning/Teaching Needs:  Home with wife who can provide assist, she is here daily at noon to supervise his eating. Has been educated regarding this.      Team Discussion:  Making good progress in therapies, goals mod/i level, weaning off O2. Sharlene Motts 42/56 will need rolling walker at discharge for stability. MD-DC PICC line. FEES- tomorrow. Working on endurance and activity tolerance  Revisions to Treatment Plan:  DC 7/20    Continued Need for Acute Rehabilitation Level of Care: The patient requires daily medical management by a physician with specialized training in physical medicine and rehabilitation for the following conditions: Daily direction of a multidisciplinary physical rehabilitation program to ensure safe treatment while eliciting the highest outcome that is of practical value to the patient.: Yes Daily medical management of patient stability for increased activity during participation in an intensive rehabilitation regime.: Yes Daily analysis of laboratory values and/or radiology reports with any subsequent need for medication adjustment of medical intervention for : Cardiac problems;Other   Reegan Bouffard, Lemar Livings 05/29/2017, 3:09 PM

## 2017-05-29 NOTE — Plan of Care (Signed)
Problem: RH Bed to Chair Transfers Goal: LTG Patient will perform bed/chair transfers w/assist (PT) LTG: Patient will perform bed/chair transfers with assistance, with/without cues (PT).  upgraded 7/18 due to progress  Problem: RH Ambulation Goal: LTG Patient will ambulate in community environment (PT) LTG: Patient will ambulate in community environment, # of feet with assistance (PT).  upgraded 7/18 due to progress

## 2017-05-29 NOTE — Progress Notes (Signed)
Pt requested not to wear O2 tonight. Will continue to monitor Harlow Asa, RN 05/29/2017

## 2017-05-29 NOTE — Progress Notes (Signed)
Occupational Therapy Session Note  Patient Details  Name: Jack Joseph MRN: [id number] Date of Birth: [birth year]  Today's Date: 05/29/2017 OT Individual Time: 0800-0900 OT Individual Time Calculation (min): 60 min    Short Term Goals: Week 1:  OT Short Term Goal 1 (Week 1): Pt will complete 1/3 components of toileting  OT Short Term Goal 2 (Week 1): Pt will initiate 2 rest breaks during bathing for energy conservation  OT Short Term Goal 3 (Week 1): Pt will exhibit increased safety awareness by adhering to sternal precautions during dressing with min vcs       Skilled Therapeutic Interventions/Progress Updates:    Pt seen for ADL retraining to include shower and dressing.  Pt stated the MD stated that he should be weaning off the O2. Pt agreeable to completing session without O2.  During session, he maintained his O2 sats above 93%.  Pt ambulated around room with steadying A without AD.  Life vest doffed and PICC line covered.  He was able to complete all self care with supervision except for min A to get life vest back on.  Pt tolerated session well and stated he felt stronger today. Pt in recliner with all needs met.  Therapy Documentation Precautions:  Precautions Precautions: Fall, Sternal Precaution Comments: lifevest Restrictions Weight Bearing Restrictions: No    Vital Signs: Oxygen Therapy SpO2: 93 % O2 Device: Not Delivered Pain: Pain Assessment Pain Assessment: No/denies pain ADL: ADL ADL Comments: Please see functional navigator for ADL status   See Function Navigator for Current Functional Status.   Therapy/Group: Individual Therapy  Hailyn Zarr 05/29/2017, 11:59 AM

## 2017-05-29 NOTE — Progress Notes (Signed)
Speech Language Pathology Daily Session Note  Patient Details  Name: Jack Joseph MRN: [id number] Date of Birth: [birth year]  Today's Date: 05/29/2017 SLP Individual Time: 1035-1130 SLP Individual Time Calculation (min): 55 min  Short Term Goals: Week 1: SLP Short Term Goal 1 (Week 1): Pt will consume current diet without overt s/s of aspiration and supervision cues for use of compensatory swallow strategies.  SLP Short Term Goal 2 (Week 1): Pt will consume trials of ice chips without overt s/s of aspiration to demonstrate readiness for instrumental study. SLP Short Term Goal 3 (Week 1): Pt will use speech intelligibility strategies with Min A to increase vocal intensity to achieve ~ 75% intelligiblity at the sentence level.  SLP Short Term Goal 4 (Week 1): Patient will perform EMST for 25 repetitions with supervision verbal cues and a self-percieved effort level of 7/10.   Skilled Therapeutic Interventions:  Pt was seen for skilled ST targeting goals for speech and dysphagia.  Therapist facilitated the session with trials of water to continue working towards diet progression.  Pt demonstrated no overt s/s of aspiration with thin liquids and wife reports that vocal intensity is almost back to normal.  As a result, recommend repeat objective assessment to determine readiness for advancement.  For now, recommend implementing the water protocol to improve pt hydration and comfort while on thickened liquids.  Pt needed intermittent supervision verbal cues for increased vocal intensity to achieve intelligibility at the conversational level.  Pt was left with wife and visitor at bedside.  Continue per current plan of care.       Function:  Eating Eating   Modified Consistency Diet: No Eating Assist Level: Supervision or verbal cues;More than reasonable amount of time;Set up assist for           Cognition Comprehension Comprehension assist level: Follows complex conversation/direction with  extra time/assistive device  Expression   Expression assist level: Expresses basic 90% of the time/requires cueing < 10% of the time.  Social Interaction Social Interaction assist level: Interacts appropriately with others - No medications needed.  Problem Solving Problem solving assist level: Solves basic problems with no assist  Memory Memory assist level: Recognizes or recalls 90% of the time/requires cueing < 10% of the time    Pain Pain Assessment Pain Assessment: No/denies pain  Therapy/Group: Individual Therapy  Carrieann Spielberg, Melanee Spry 05/29/2017, 12:31 PM

## 2017-05-29 NOTE — Progress Notes (Signed)
Occupational Therapy Session Note  Patient Details  Name: Jack Joseph MRN: [id number] Date of Birth: [birth year]  Today's Date: 05/29/2017 OT Individual Time: 5974-1638 OT Individual Time Calculation (min): 30 min    Short Term Goals: Week 1:  OT Short Term Goal 1 (Week 1): Pt will complete 1/3 components of toileting  OT Short Term Goal 2 (Week 1): Pt will initiate 2 rest breaks during bathing for energy conservation  OT Short Term Goal 3 (Week 1): Pt will exhibit increased safety awareness by adhering to sternal precautions during dressing with min vcs   Skilled Therapeutic Interventions/Progress Updates:    Pt completed toileting standing with close supervision and no assistive device.  He ambulated to the sink and washed his hands with supervision prior to leaving the room for therapy.  Had him walk to the gym, noted occasional staggering but no physical assist needed while ambulating without assistive device.  He was able to work on sit to stand transitions for various heights to simulate toilet at home.  Supervision for sit to stand from 21 inch height mat without use of UEs.  Min to mod assist needing for sit to stand for 17 inch height, without use of UEs secondary to sternal precautions.  He was able to complete stepping over into the tub with supervision using the wall for support with the LUE.  Min assist needed for several intervals of stepping over without use of UEs and therapist supporting him with a belt.  Returned to room at end of session.  Dyspnea with activity 2/4.  Unable to obtain pulse ox reading with several attempts.  Pt left sitting in bedside chair with call button and phone in reach.  Discussed need for 3:1 at home and SW notified of this as well.      Therapy Documentation Precautions:  Precautions Precautions: Fall, Sternal Precaution Comments: lifevest Restrictions Weight Bearing Restrictions: No  Pain: Pain Assessment Pain Assessment: No/denies  pain ADL: See Function Navigator for Current Functional Status.   Therapy/Group: Individual Therapy  Haydin Calandra OTR/L 05/29/2017, 4:24 PM

## 2017-05-29 NOTE — Progress Notes (Signed)
Social Work Patient ID: Jack Joseph, male   DOB: 07/08/1946, 71 y.o.   MRN: [id number]  Met with pt to discuss team conference goals mod/i level and target discharge 7/20. He is very pleased with The discharge date and plan. His wife is aware of the date. Will see on FEE"s goes tomorrow. Pt feels he will be ready to go home Friday.  

## 2017-05-29 NOTE — Patient Care Conference (Signed)
Inpatient RehabilitationTeam Conference and Plan of Care Update Date: 05/29/2017   Time: 2:15 PM    Patient Name: Jack Joseph      Medical Record Number: [id number]  Date of Birth: 1945-12-24 Sex: Male         Room/Bed: 4M02C/4M02C-01 Payor Info: Payor: MEDICARE / Plan: MEDICARE PART A AND B / Product Type: *No Product type* /    Admitting Diagnosis: Debility after Cardiac Surgery  Admit Date/Time:  05/23/2017  3:28 PM Admission Comments: No comment available   Primary Diagnosis:  <principal problem not specified> Principal Problem: <principal problem not specified>  Patient Active Problem List   Diagnosis Date Noted  . Hypoxia   . Supratherapeutic INR   . Acute blood loss anemia   . History of cardiac arrest   . Dysphagia   . Hypoalbuminemia due to protein-calorie malnutrition (HCC)   . Pleural effusion   . Debility 05/23/2017  . Atelectasis   . Hyperglycemia   . Delirium   . Complication of chest tube   . Chest tube in place   . Acute encephalopathy   . Essential hypertension   . Dilatation of aorta (HCC)   . Postop check   . Acute respiratory failure with hypoxia (HCC)   . VT (ventricular tachycardia) (HCC)   . 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 Thoracic aortic aneurysm without rupture (HCC) 02/25/2017  . Mass of left lung 02/25/2017  . Acute on chronic systolic (congestive) heart failure (HCC) 02/25/2017  . CHF (congestive heart failure) (HCC) 07/10/2016  . Varicose veins of both lower extremities 07/10/2016  . Erectile dysfunction 07/10/2016  . AVD (aortic valve disease)   . Dilated cardiomyopathy (HCC)   . Hyperlipidemia   . Chronic anticoagulation   . Hx of replacement of aortic valve 03/15/2011    Expected Discharge Date: Expected Discharge Date: 05/31/17  Team Members Present: Physician leading conference: Dr. Maryla Morrow Social Worker Present: Dossie Der, LCSW Nurse Present: Other (comment) Albertine Patricia) PT Present: Other  (comment) Erick Blinks) OT Present: Perrin Maltese, OT SLP Present: Jackalyn Lombard, SLP PPS Coordinator present : Tora Duck, RN, CRRN     Current Status/Progress Goal Weekly Team Focus  Medical   Deconditioning secondary to aortic root repair with postoperative cardiac arrest  Improve mobility, transfers, safety, endurance, hypoxia, dysphagia  See above   Bowel/Bladder   continent  Bowel and bladder         Swallow/Nutrition/ Hydration   Regular, nectar thick liquids; full supervision   mod I   trials of thin liquids to continue working towards repeat objective assessment, IMST and EMST to improve respiratory endurance   ADL's   Supervision for bathing sit to stand as well as dressing.  Supervision for all toileting and shower/tub transfers.  supervision to modified independent  selfcare retraining, balance retraining, neuromuscular re-education, endurance building, pt/family education, DME education   Mobility   supervision with RW ambulation/transfers, min assist for transfers/ambulation without AD, and stairs  mod I for ambulation, transfers, and bed mobility. Supervision for stairs and car transfer  balance, endurance/activity tolerance, LE strengthening, d/c planning    Communication   supervision for intelligibility   mod I   IMST and EMST; education and carryover of intelligibility strategies    Safety/Cognition/ Behavioral Observations  no unsafe behavior/low bed   supervision  assess safety q shift prn   Pain   headaches when OOB/scheduled tramadol 50mg  q 6hrs   pain less than or equal to 2  assess pain q shift   Skin   mod assist   left chest skin glue, foam to buttocks-stage II small area of sacrum,   assess skin q shift    Rehab Goals Patient on target to meet rehab goals: Yes *See Care Plan and progress notes for long and short-term goals.     Barriers to Discharge  Current Status/Progress Possible Resolutions Date Resolved   Physician    New oxygen   dysphagia, ABLA  See above  Therapies, wean supplemental O2, advance diet as tolerated, follow labs   7/19   Nursing                  PT                    OT                  SLP                SW                Discharge Planning/Teaching Needs:  Home with wife who can provide assist, she is here daily at noon to supervise his eating. Has been educated regarding this.      Team Discussion:  Making good progress in therapies, goals mod/i level, weaning off O2. Sharlene Motts 42/56 will need rolling walker at discharge for stability. MD-DC PICC line. FEES- tomorrow. Working on endurance and activity tolerance  Revisions to Treatment Plan:  DC 7/20    Continued Need for Acute Rehabilitation Level of Care: The patient requires daily medical management by a physician with specialized training in physical medicine and rehabilitation for the following conditions: Daily direction of a multidisciplinary physical rehabilitation program to ensure safe treatment while eliciting the highest outcome that is of practical value to the patient.: Yes Daily medical management of patient stability for increased activity during participation in an intensive rehabilitation regime.: Yes Daily analysis of laboratory values and/or radiology reports with any subsequent need for medication adjustment of medical intervention for : Cardiac problems;Other  Wai Minotti, Lemar Livings 05/29/2017, 3:09 PM

## 2017-05-29 NOTE — Progress Notes (Signed)
Physical Therapy Session Note  Patient Details  Name: Jack Joseph MRN: [id number] Date of Birth: [birth year]  Today's Date: 05/29/2017 PT Individual Time: 0900-1000 PT Individual Time Calculation (min): 60 min   Short Term Goals: Week 1:  PT Short Term Goal 1 (Week 1): Pt will perform bed mobility with supervision  PT Short Term Goal 2 (Week 1): Pt will ambulate 150 ft with min assist and LRAD PT Short Term Goal 3 (Week 1): Pt will propel w/c 150 ft with supervision   Skilled Therapeutic Interventions/Progress Updates:    Pt sitting in recliner in room upon PT arrival, agreeable to therapy tx. Pt ambulated 150 ft to the gym without AD and min assist, SpO2 88% after ambulation. Worked on limits of stability training on biodex, trial 1 30%, trial 2 32% and trial 3 47%.  SpO2 up to 94% after seated rest break. Pt performed 10 step ups per leg at the stairs with B UE support for LE strengthening, SpO2 to 85% with this activity, back to 92% with 1 min seated rest break. Pt performed 2 x 5 sit to stands with rest break between, SpO2 90%. Pt performed side stepping without AD and supervision 2 x 25ft each way working on LE strengthening and dynamic balance. Pt sidestepping while throwing/catching ball x 20 ft in each direction working on dynamic balance, Min assist. Pt ambulated 150 ft back to room without AD and min assist. Pt left in recliner with needs in reach. Pt denies pain this session.   Therapy Documentation Precautions:  Precautions Precautions: Fall, Sternal Precaution Comments: lifevest Restrictions Weight Bearing Restrictions: No   See Function Navigator for Current Functional Status.   Therapy/Group: Individual Therapy  Cresenciano Genre, PT, DPT 05/29/2017, 9:18 AM

## 2017-05-30 ENCOUNTER — Inpatient Hospital Stay (HOSPITAL_COMMUNITY): Payer: Medicare Other

## 2017-05-30 ENCOUNTER — Inpatient Hospital Stay (HOSPITAL_COMMUNITY): Payer: Medicare Other | Admitting: Speech Pathology

## 2017-05-30 ENCOUNTER — Inpatient Hospital Stay (HOSPITAL_COMMUNITY): Payer: Medicare Other | Admitting: Occupational Therapy

## 2017-05-30 LAB — PROTIME-INR
INR: 2.2
Prothrombin Time: 24.8 seconds — ABNORMAL HIGH (ref 11.4–15.2)

## 2017-05-30 MED ORDER — PANTOPRAZOLE SODIUM 40 MG PO TBEC
40.0000 mg | DELAYED_RELEASE_TABLET | Freq: Every day | ORAL | Status: DC
  Administered 2017-05-30 – 2017-05-31 (×2): 40 mg via ORAL
  Filled 2017-05-30 (×2): qty 1

## 2017-05-30 MED ORDER — WARFARIN SODIUM 2 MG PO TABS
4.0000 mg | ORAL_TABLET | Freq: Once | ORAL | Status: AC
  Administered 2017-05-30: 4 mg via ORAL
  Filled 2017-05-30: qty 2

## 2017-05-30 NOTE — Progress Notes (Signed)
Speech Language Pathology Discharge Summary  Patient Details  Name: Jack Joseph MRN: [id number] Date of Birth: 03/10/1946  Today's Date: 05/30/2017 SLP Individual Time: 1146-4314 SLP Individual Time Calculation (min): 51 min   Skilled Therapeutic Interventions:  Pt was seen for skilled ST targeting goals for dysphagia and communication.  Pt consumed thin liquids without overt s/s of aspiration and mod I use of swallowing precautions.  Pt verbalized understanding of the results of FEES via teach back.  Pt utilized increased vocal intensity to achieve intelligibility during both structured and unstructured speech tasks in a moderately distracting environment with mod I.  Pt's questions regarding swallowing and speech function were answered to his satisfaction at this time.  Pt was returned to room and left in chair with RN present.      Patient has met 4 of 4 long term goals.  Patient to discharge at overall Modified Independent level.  Reasons goals not met:     Clinical Impression/Discharge Summary:   Pt has made excellent functional gains while inpatient and is discharging having met 4 out of 4 long term goals.  Pt's diet has been upgraded to regular textures and thin liquids which he is consuming with mod I use of swallowing precautions.  Pt is utilizing EMST device to address respiratory support for speech and swallowing with mod I 2x per day.  It is recommended that he continue to use device once he returns home.  Pt has also demonstrated improved vocal intensity with subsequently improved intelligibility at the conversational level.  Pt and family education is complete at this time.  Pt is discharging home with assistance from family.  No further ST needs are indicated at this time as pt is mod I for speech and swallowing.    Care Partner:  Caregiver Able to Provide Assistance:  (n/a)  Type of Caregiver Assistance:  (n/a)  Recommendation:  None      Equipment: none recommended  by SLP    Reasons for discharge: Discharged from hospital   Patient/Family Agrees with Progress Made and Goals Achieved: Yes   Function:  Eating Eating   Modified Consistency Diet: No Eating Assist Level: Swallowing techniques: self managed           Cognition Comprehension Comprehension assist level: Follows complex conversation/direction with no assist  Expression   Expression assist level: Expresses complex 90% of the time/cues < 10% of the time  Social Interaction Social Interaction assist level: Interacts appropriately with others - No medications needed.  Problem Solving Problem solving assist level: Solves basic problems with no assist  Memory Memory assist level: Recognizes or recalls 90% of the time/requires cueing < 10% of the time   Emilio Math 05/30/2017, 4:15 PM

## 2017-05-30 NOTE — Progress Notes (Signed)
Occupational Therapy Discharge Summary  Patient Details  Name: Jack Joseph MRN: [id number] Date of Birth: 05/24/1946  Today's Date: 05/30/2017 OT Individual Time: 1106-1200 OT Individual Time Calculation (min): 54 min    Session Note:  Pt completed sponge bath and dressing sit to stand at the sink during session.  Modified independent for all bathing and dressing at the sink.  He did not remove life vest during bathing however.  Modified independent with use of the walker when gathering items for bathing and for dressing and placing them on his walker to carry them.  He was able to ambulate with supervision down to the tub/shower room with use of the RW and practice step in tub transfers with supervision, using the wall of the shower for support.  Pt still with slight difficulty clearing his LEs over the edge of the tub, so needs UE support.  Pt's oxygen sats 97-100% on room air during session.  Pt left in chair with spouse present.  Emphasized the need for continuous use of the RW at home during all transfers with supervision for showering and dressing tasks at this time.  She voiced understanding and stated she had been given a BSC from someone to use earlier today.    Patient has met 9 of 9 long term goals due to improved activity tolerance, improved balance, postural control and ability to compensate for deficits.  Patient to discharge at overall Supervision level.  Patient's care partner is independent to provide the necessary physical assistance at discharge.    Reasons goals not met: NA  Recommendation:  Patient will benefit from ongoing skilled OT services in home health setting to continue to advance functional skills in the area of BADL, Vocation and Reduce care partner burden.  Recommend HHOT eval for safety and for continued work on dynamic standing balance as it relates to selfcare independence.  Pt still needing supervision for safety when attempting tasks without assistive  device, which was not his baseline PTA.    Equipment: RW  Reasons for discharge: treatment goals met and discharge from hospital  Patient/family agrees with progress made and goals achieved: Yes  OT Discharge Precautions/Restrictions  Precautions Precautions: Fall;Sternal Precaution Comments: lifevest Restrictions Weight Bearing Restrictions: No Pain Pain Assessment Pain Assessment: No/denies pain ADL ADL ADL Comments: Please see functional navigator for ADL status Vision Baseline Vision/History: Wears glasses Wears Glasses: Reading only Patient Visual Report: No change from baseline Vision Assessment?: No apparent visual deficits Perception  Perception: Within Functional Limits Praxis Praxis: Intact Cognition Overall Cognitive Status: Within Functional Limits for tasks assessed Arousal/Alertness: Awake/alert Orientation Level: Oriented X4 Attention: Sustained Sustained Attention: Appears intact Memory: Appears intact Awareness: Impaired Awareness Impairment: Anticipatory impairment Problem Solving: Appears intact Safety/Judgment: Impaired Comments: Pt at times not wanting to use the walker.  Has been instructed on safe use of the walker.  Sensation Sensation Light Touch: Appears Intact Stereognosis: Appears Intact Hot/Cold: Appears Intact Proprioception: Appears Intact Coordination Gross Motor Movements are Fluid and Coordinated: Yes Fine Motor Movements are Fluid and Coordinated: Yes Coordination and Movement Description: B LEs Heel Shin Test: [facility], [facility], [facility] Motor  Motor Motor: Within Functional Limits Mobility  Transfers Sit to Stand: 6: Modified independent (Device/Increase time);With upper extremity assist;From chair/3-in-1;With armrests Stand to Sit: 6: Modified independent (Device/Increase time);With upper extremity assist;To chair/3-in-1  Trunk/Postural Assessment  Cervical Assessment Cervical Assessment: Exceptions to [facility] (cervical protraction) Thoracic  Assessment Thoracic Assessment: Exceptions to [facility] (thoracic rounding) Lumbar Assessment Lumbar Assessment: Exceptions to [facility] (slight thoracic  kyphosis) Postural Control Postural Control: Within Functional Limits  Balance Balance Balance Assessed: Yes Berg Balance Test Sit to Stand: Able to stand without using hands and stabilize independently Standing Unsupported: Able to stand safely 2 minutes Sitting with Back Unsupported but Feet Supported on Floor or Stool: Able to sit safely and securely 2 minutes Stand to Sit: Controls descent by using hands Transfers: Able to transfer safely, minor use of hands Standing Unsupported with Eyes Closed: Able to stand 10 seconds safely Standing Ubsupported with Feet Together: Able to place feet together independently and stand 1 minute safely From Standing, Reach Forward with Outstretched Arm: Can reach confidently >25 cm (10") From Standing Position, Pick up Object from Floor: Able to pick up shoe, needs supervision From Standing Position, Turn to Look Behind Over each Shoulder: Looks behind one side only/other side shows less weight shift Turn 360 Degrees: Able to turn 360 degrees safely in 4 seconds or less Standing Unsupported, Alternately Place Feet on Step/Stool: Able to complete 4 steps without aid or supervision Standing Unsupported, One Foot in Front: Able to plae foot ahead of the other independently and hold 30 seconds Standing on One Leg: Able to lift leg independently and hold equal to or more than 3 seconds Total Score: 48 Static Sitting Balance Static Sitting - Level of Assistance: 7: Independent Dynamic Sitting Balance Dynamic Sitting - Balance Support: During functional activity Dynamic Sitting - Level of Assistance: 7: Independent Static Standing Balance Static Standing - Balance Support: During functional activity Static Standing - Level of Assistance: 6: Modified independent (Device/Increase time) Dynamic Standing  Balance Dynamic Standing - Balance Support: Bilateral upper extremity supported Dynamic Standing - Level of Assistance: 6: Modified independent (Device/Increase time) Extremity/Trunk Assessment RUE Assessment RUE Assessment: Within Functional Limits (AROM WFLS for ADL tasks with grip strength 4/5 throughout.  further strength not tested secodnary to sternal precations.  ) LUE Assessment LUE Assessment: Within Functional Limits (Pt with AROM WFLS for all joints and grip strength 4/5 throughout.  No further strength tested secondary to sternal precautions.  Pt at times complaining of left shooting biceps pain when reaching down with the LUE to donn clothing, but otherwise not lim)   See Function Navigator for Current Functional Status.  Angelyn Osterberg OTR/L 05/30/2017, 4:41 PM

## 2017-05-30 NOTE — Evaluation (Signed)
Recreational Therapy Assessment and Plan  Patient Details  Name: Jack Joseph MRN: [id number] Date of Birth: [birth year] Today's Date: 05/30/2017  Rehab Potential: Good ELOS: discharge 7/20   Assessment  Problem List:      Patient Active Problem List   Diagnosis Date Noted  . Acute blood loss anemia   . History of cardiac arrest   . Dysphagia   . Hypoalbuminemia due to protein-calorie malnutrition ([place])   . Pleural effusion   . Debility 05/23/2017  . Atelectasis   . Hyperglycemia   . Delirium   . Complication of chest tube   . Chest tube in place   . Acute encephalopathy   . Essential hypertension   . Dilatation of aorta (HCC)   . Postop check   . Acute respiratory failure with hypoxia ([place])   . VT (ventricular tachycardia) ([place])   . 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 Thoracic aortic aneurysm without rupture ([place]) 02/25/2017  . Mass of left lung 02/25/2017  . Acute on chronic systolic (congestive) heart failure ([place]) 02/25/2017  . CHF (congestive heart failure) ([place]) 07/10/2016  . Varicose veins of both lower extremities 07/10/2016  . Erectile dysfunction 07/10/2016  . AVD (aortic valve disease)   . Dilated cardiomyopathy ([place])   . Hyperlipidemia   . Chronic anticoagulation   . Hx of replacement of aortic valve 03/15/2011    Past Medical History:      Past Medical History:  Diagnosis Date  . AVD (aortic valve disease)   . Chronic anticoagulation   . Dilated cardiomyopathy (HCC)    EF 30-35%  . Dysrhythmia   . Hyperlipidemia   . LV dysfunction    Past Surgical History:       Past Surgical History:  Procedure Laterality Date  . AORTIC VALVE REPLACEMENT    . BENTALL PROCEDURE N/A 04/29/2017   Procedure: REPLACEMENT ASCENDING AORTA/ WHEAT PROCEDURE, HYPOTHERMIC CIRCULATORY ARREST AND CARDIOPULMONARY BYPASS, AND USE OF HEMASHIELD PLATINUM WOVEN DOUBLE VELOUR VASCULAR GRAFT 32 MM X 50 CM;  Surgeon: [name], MD;  Location: [facility];  Service: Open Heart Surgery;  Laterality: N/A;  . DOPPLER ECHOCARDIOGRAPHY  04/07/2002   EF 30-35%  . RIGHT/LEFT HEART CATH AND CORONARY ANGIOGRAPHY N/A 03/05/2017   Procedure: Right/Left Heart Cath and Coronary Angiography;  Surgeon: Peter M Martinique, MD;  Location: [place] [facility];  Service: Cardiovascular;  Laterality: N/A;  . TEE WITHOUT CARDIOVERSION N/A 04/29/2017   Procedure: TRANSESOPHAGEAL ECHOCARDIOGRAM (TEE);  Surgeon: [name], MD;  Location: [facility] [place];  Service: Open Heart Surgery;  Laterality: N/A;    Assessment & Plan Clinical Impression: Patient is a 71 y.o. year old male with [name] a 71 y.o.malewith history of CAD s/pCABG, chronic systolic CHF s/p AVR '83- chronic coumadin, COPD who was admitted on 04/29/17 for redo sternotomy and repair of ascending aortic dilation/Wheat procedure. Post op course complicated by VT with PEA arrest requiring CPR/ACLS protocol and intubation, R-PTX with large amount of subcutaneous emphysema right lateral chest, hypotension requiring pressors, severe left atelectasis with mucous plugging requiring reintubation and hypoxemia. Dr. Lovena Le consulted for input and anticipated ICD insertion prior to discharge to home. He tolerated extubation on 7/1 and was started on dysphagia 3, nectar liquids on 7/3 due to trace aspiration of thins. MBS repeated on 7/10 showing no improvement and aspiration of thin liquids. He continues to have issues with diarrhea requiring rectal tube but stools negative for C diff. Lifevest ordered due to  and LifeVest ordered as repeat echo with severe global reduction in LVF with diffuse hypokineses and EF 20-25%. Patient noted to be deconditioned with cognitive deficits affecting mobility and ability to carry out ADL tasks. Pacer wires removed today and patient cleared medically to start CIR program. Patient transferred to CIR on 05/23/2017.    Leisure  History/Participation Premorbid leisure interest/current participation: [place] care;Community - Grocery store;Community - Travel (Comment);Games - Cards Expression Interests: Music (Comment) Other Leisure Interests: Television;Reading;Computer Leisure Participation Style: Alone Awareness of Community Resources: Excellent Psychosocial / Spiritual Spiritual Interests: [place] Patient agreeable to Pet Therapy: Yes Does patient have pets?: No Social interaction - Mood/Behavior: Cooperative Engineer, drilling for Education?: Yes Recreational Therapy Orientation Orientation -Reviewed with patient: Available activity resources Strengths/Weaknesses Patient Strengths/Abilities: Willingness to participate;Active premorbidly Patient weaknesses: Physical limitations TR Patient demonstrates impairments in the following area(s): Endurance;Safety  Plan Rec Therapy Plan Is patient appropriate for Therapeutic Recreation?: Yes Rehab Potential: Good Estimated Length of Stay: discharge 7/20   Met with pt and pt's wife to discuss leisure interests, activity analysis with potential modifications, energy conservation techniques, community reintegration and home safety.  Both stated understanding and are anxious for upcoming discharge.  No further TR as pt with expected discharge date of 7/20.  Recommendations for other services: None   Discharge Criteria: Patient will be discharged from TR if patient refuses treatment 3 consecutive times without medical reason.  If treatment goals not met, if there is a change in medical status, if patient makes no progress towards goals or if patient is discharged from hospital.  The above assessment, treatment plan, treatment alternatives and goals were discussed and mutually agreed upon: by patient  [place] 05/30/2017, 8:37 AM

## 2017-05-30 NOTE — Progress Notes (Signed)
Subjective/Complaints: Pt seen laying in bed this Am.  He slept well overnight and states he no longer requires supplemental O2.  ROS: Denies CP, SOB, N/V/D  Objective: Vital Signs: Blood pressure (!) 108/54, pulse 86, temperature 98.1 F (36.7 C), temperature source Oral, resp. rate 18, weight 60.1 kg (132 lb 9.5 oz), SpO2 92 %. No results found. Results for orders placed or performed during the hospital encounter of 05/23/17 (from the past 72 hour(s))  CBC with Differential/Platelet     Status: Abnormal   Collection Time: 05/27/17  7:52 AM  Result Value Ref Range   WBC 7.8 4.0 - 10.5 K/uL   RBC 3.55 (L) 4.22 - 5.81 MIL/uL   Hemoglobin 10.3 (L) 13.0 - 17.0 g/dL   HCT 33.3 (L) 39.0 - 52.0 %   MCV 93.8 78.0 - 100.0 fL   MCH 29.0 26.0 - 34.0 pg   MCHC 30.9 30.0 - 36.0 g/dL   RDW 15.4 11.5 - 15.5 %   Platelets 365 150 - 400 K/uL   Neutrophils Relative % 72 %   Neutro Abs 5.6 1.7 - 7.7 K/uL   Lymphocytes Relative 17 %   Lymphs Abs 1.4 0.7 - 4.0 K/uL   Monocytes Relative 8 %   Monocytes Absolute 0.6 0.1 - 1.0 K/uL   Eosinophils Relative 2 %   Eosinophils Absolute 0.2 0.0 - 0.7 K/uL   Basophils Relative 1 %   Basophils Absolute 0.0 0.0 - 0.1 K/uL  Basic metabolic panel     Status: Abnormal   Collection Time: 05/27/17  9:02 AM  Result Value Ref Range   Sodium 136 135 - 145 mmol/L   Potassium 4.4 3.5 - 5.1 mmol/L   Chloride 99 (L) 101 - 111 mmol/L   CO2 30 22 - 32 mmol/L   Glucose, Bld 112 (H) 65 - 99 mg/dL   BUN 16 6 - 20 mg/dL   Creatinine, Ser 0.76 0.61 - 1.24 mg/dL   Calcium 8.6 (L) 8.9 - 10.3 mg/dL   GFR calc non Af Amer >60 >60 mL/min   GFR calc Af Amer >60 >60 mL/min    Comment: (NOTE) The eGFR has been calculated using the CKD EPI equation. This calculation has not been validated in all clinical situations. eGFR's persistently <60 mL/min signify possible Chronic Kidney Disease.    Anion gap 7 5 - 15  Protime-INR     Status: Abnormal   Collection Time: 05/28/17   3:42 AM  Result Value Ref Range   Prothrombin Time 35.3 (H) 11.4 - 15.2 seconds   INR 3.42   Protime-INR     Status: Abnormal   Collection Time: 05/29/17  4:58 AM  Result Value Ref Range   Prothrombin Time 29.6 (H) 11.4 - 15.2 seconds   INR 5.40   Basic metabolic panel     Status: Abnormal   Collection Time: 05/29/17  4:58 AM  Result Value Ref Range   Sodium 136 135 - 145 mmol/L   Potassium 3.7 3.5 - 5.1 mmol/L   Chloride 99 (L) 101 - 111 mmol/L   CO2 29 22 - 32 mmol/L   Glucose, Bld 91 65 - 99 mg/dL   BUN 21 (H) 6 - 20 mg/dL   Creatinine, Ser 0.76 0.61 - 1.24 mg/dL   Calcium 8.5 (L) 8.9 - 10.3 mg/dL   GFR calc non Af Amer >60 >60 mL/min   GFR calc Af Amer >60 >60 mL/min    Comment: (NOTE) The eGFR has been calculated  using the CKD EPI equation. This calculation has not been validated in all clinical situations. eGFR's persistently <60 mL/min signify possible Chronic Kidney Disease.    Anion gap 8 5 - 15  Protime-INR     Status: Abnormal   Collection Time: 05/30/17  4:49 AM  Result Value Ref Range   Prothrombin Time 24.8 (H) 11.4 - 15.2 seconds   INR 2.20     Gen: NAD. Vital signs reviewed.  HEENT: hoarse voice. Normocephalic, atraumatic Cardio: RRR and no JVD. Resp: CTA B/L and Unlabored.  GI: BS positive and ND Musc/Skel:  No edema, no tenderness. Neuro: Alert Motor 4+/5 throughout.  Skin:   Intact. Warm and dry. Psych: Normal mood and affect.   Assessment/Plan: 1. Functional deficits secondary to Debility, s/p cardiac arrest which require 3+ hours per day of interdisciplinary therapy in a comprehensive inpatient rehab setting. Physiatrist is providing close team supervision and 24 hour management of active medical problems listed below. Physiatrist and rehab team continue to assess barriers to discharge/monitor patient progress toward functional and medical goals. FIM: Function - Bathing Position: Shower Body parts bathed by patient: Right arm, Left arm, Front  perineal area, Buttocks, Right upper leg, Left upper leg, Right lower leg, Left lower leg, Chest, Abdomen Body parts bathed by helper: Back Bathing not applicable: Chest, Abdomen Assist Level: Supervision or verbal cues  Function- Upper Body Dressing/Undressing What is the patient wearing?: Pull over shirt/dress Bra - Perfomed by patient: Thread/unthread right bra strap Bra - Perfomed by helper: Hook/unhook bra (pull down sports bra), Thread/unthread left bra strap (life vest) Pull over shirt/dress - Perfomed by patient: Thread/unthread right sleeve, Thread/unthread left sleeve, Put head through opening, Pull shirt over trunk Assist Level: Supervision or verbal cues Function - Lower Body Dressing/Undressing What is the patient wearing?: Underwear, Pants, Socks, Shoes Position: Wheelchair/chair at [employer] - Performed by patient: Thread/unthread right underwear leg, Thread/unthread left underwear leg, Pull underwear up/down Pants- Performed by patient: Thread/unthread left pants leg, Thread/unthread right pants leg, Pull pants up/down Pants- Performed by helper: Pull pants up/down Non-skid slipper socks- Performed by patient: Don/doff right sock, Don/doff left sock Socks - Performed by patient: Don/doff right sock, Don/doff left sock Shoes - Performed by patient: Don/doff right shoe, Don/doff left shoe, Fasten right, Fasten left TED Hose - Performed by helper: Don/doff right TED hose, Don/doff left TED hose Assist for footwear: Supervision/touching assist Assist for lower body dressing: Supervision or verbal cues  Function - Toileting Toileting steps completed by patient: Adjust clothing prior to toileting, Performs perineal hygiene, Adjust clothing after toileting Toileting steps completed by helper: Adjust clothing prior to toileting, Performs perineal hygiene, Adjust clothing after toileting Assist level: Supervision or verbal cues  Function - Toilet Transfers Toilet transfer  assistive device: Bedside commode Assist level to toilet: Touching or steadying assistance (Pt > 75%) Assist level from toilet: Touching or steadying assistance (Pt > 75%) Assist level to bedside commode (at bedside): Touching or steadying assistance (Pt > 75%) Assist level from bedside commode (at bedside): Touching or steadying assistance (Pt > 75%)  Function - Chair/bed transfer Chair/bed transfer activity did not occur: Safety/medical concerns Chair/bed transfer method: Ambulatory Chair/bed transfer assist level: Touching or steadying assistance (Pt > 75%) Chair/bed transfer assistive device:  (no device) Chair/bed transfer details: Verbal cues for precautions/safety, Tactile cues for sequencing, Tactile cues for weight shifting, Tactile cues for posture  Function - Locomotion: Wheelchair Will patient use wheelchair at discharge?: Yes Type: Manual Wheelchair activity did  not occur: Safety/medical concerns (decreased activity tolerance, unstable vitals (HR and SpO2)) Assist Level: Dependent (Pt equals 0%) (for transport) Wheel 50 feet with 2 turns activity did not occur: Safety/medical concerns Wheel 150 feet activity did not occur: Safety/medical concerns Turns around,maneuvers to table,bed, and toilet,negotiates 3% grade,maneuvers on rugs and over doorsills: No Function - Locomotion: Ambulation Ambulation activity did not occur: Refused (pain) Assistive device: No device Max distance: 150 ft Assist level: Touching or steadying assistance (Pt > 75%) Assist level: Touching or steadying assistance (Pt > 75%) Walk 50 feet with 2 turns activity did not occur: Safety/medical concerns (decreased activity tolerance, unstable vitals (HR and SpO2)) Assist level: Touching or steadying assistance (Pt > 75%) Walk 150 feet activity did not occur: Safety/medical concerns Assist level: Touching or steadying assistance (Pt > 75%) Walk 10 feet on uneven surfaces activity did not occur:  Safety/medical concerns Assist level: Touching or steadying assistance (Pt > 75%)  Function - Comprehension Comprehension: Auditory Comprehension assist level: Follows complex conversation/direction with extra time/assistive device  Function - Expression Expression: Verbal Expression assist level: Expresses basic 90% of the time/requires cueing < 10% of the time.  Function - Social Interaction Social Interaction assist level: Interacts appropriately with others - No medications needed.  Function - Problem Solving Problem solving assist level: Solves basic problems with no assist  Function - Memory Memory assist level: Recognizes or recalls 90% of the time/requires cueing < 10% of the time Patient normally able to recall (first 3 days only): Current season, Staff names and faces  Medical Problem List and Plan: 1. Deconditioningsecondary to aortic root repair with postoperative cardiac arrest  Cont CIR 2. Replacement of Ascending aorta/Wheat procedure/DVT Prophylaxis/Anticoagulation: Pharmaceutical: Coumadin             INR therapeutic on 7/19 3. Pain Management: tylenol prn 4. Mood: LCSW to follow for evaluation and support.  5. Neuropsych: This patient is ?fullycapable of making decisions on hisown behalf. 6. Skin/Wound Care: routine pressure relief measures.  7. Fluids/Electrolytes/Nutrition: Strict I/Os. Check daily weights.              BMP within acceptable range on 7/18  Labs ordered for tomorrow 8.Chronic systolic CHF with Hypotension: No BB due to advanced HF/low EF and No ACE/ARB due to labile BP. There were no vitals filed for this visit. 9. Abnormal LFTs: Resolved             will continue to monitor.No statins.  10. Leucocytosis: Resolved             Monitor for signs of infections. Diarrhea resolved. 11. ABLA:              Hb 10.3 on 7/16             Cont to monitor             Hemoccult neg 12. VT arrest s/p DCCV X 3 t: On amiodarone 200 mg daily.  Continue to monitor LFTs. Left Vest in place.  13. Dysphagia: dysphagia 3, nectar liquids with aspiration precautions advanced to regular diet nectar liquids.              Advance diet as tolerated 14. Hypoxia: Continues to be limited by DOE/Hypoxia. Continue nebs tid. Encourage IS while awake             Weaned supplemental O2              CXR 7/12 reviewed, b/l pleural effusions - Cards  consulted, Lasix initiated.  Appreciate recs 15. Hypoalbuminemia             Supplement initiated 7/13  LOS (Days) 7 A FACE TO FACE EVALUATION WAS PERFORMED  Tyneisha Hegeman Lorie Phenix 05/30/2017, 7:43 AM

## 2017-05-30 NOTE — Progress Notes (Signed)
Called Kelly with [employer]. Informed that pt not getting 24 hour battery life.  Needed to know which one, Unsure of which one that may have issue. Reported level of battery in charger that is at full charge.  Battery in Life Vest is reported 3 out of 4 charged.  Suggested to see to what happens over night with battery that is currently in use and should hold till 7am tomorrow.  If pt continues to have problem, pt is to report info to [employer] and then contact her back with info so she can go out and assess. Informed pt of conversation.

## 2017-05-30 NOTE — Progress Notes (Signed)
Social Work  Discharge Note  The overall goal for the admission was met for:   Discharge location: Yes-HOME WITH WIFE WHO CAN PROVIDE 24 HR SUPERVISION  Length of Stay: Yes-8 DAYS  Discharge activity level: Yes-MOD/I LEVEL-SUPERVISION   Home/community participation: Yes  Services provided included: MD, RD, PT, OT, SLP, RN, CM, TR, Pharmacy, Neuropsych and SW  Financial Services: Medicare and Private Insurance: Mining engineer  Follow-up services arranged: Home Health: [facility]-PT,OT,RN, DME: [facility] and Patient/Family has no preference for HH/DME agencies  Comments (or additional information):WIFE WAS HERE [place], PT IS DOING WELL AND BOTH FEEL HE IS READY FOR DISCHARGE TODAY.  Patient/Family verbalized understanding of follow-up arrangements: Yes  Individual responsible for coordination of the follow-up plan: SELF & SANDRA-WIFE  Confirmed correct DME delivered: Elease Hashimoto 05/30/2017    Elease Hashimoto

## 2017-05-30 NOTE — Procedures (Signed)
Objective Swallowing Evaluation: FEES-Fiberoptic Endoscopic Evaluation of Swallow  Patient Details  Name: Jack Joseph MRN: [id number] Date of Birth: Sep 17, 1946  Today's Date: 05/30/2017 Time:  -     Past Medical History:  Past Medical History:  Diagnosis Date  . AVD (aortic valve disease)   . Chronic anticoagulation   . Dilated cardiomyopathy (HCC)    EF 30-35%  . Dysrhythmia   . Hyperlipidemia   . LV dysfunction    Past Surgical History:  Past Surgical History:  Procedure Laterality Date  . AORTIC VALVE REPLACEMENT    . BENTALL PROCEDURE N/A 04/29/2017   Procedure: REPLACEMENT ASCENDING AORTA/ WHEAT PROCEDURE, HYPOTHERMIC CIRCULATORY ARREST AND CARDIOPULMONARY BYPASS, AND USE OF HEMASHIELD PLATINUM WOVEN DOUBLE VELOUR VASCULAR GRAFT 32 MM X 50 CM;  Surgeon: [name], MD;  Location: [facility] OR;  Service: Open Heart Surgery;  Laterality: N/A;  . DOPPLER ECHOCARDIOGRAPHY  04/07/2002   EF 30-35%  . RIGHT/LEFT HEART CATH AND CORONARY ANGIOGRAPHY N/A 03/05/2017   Procedure: Right/Left Heart Cath and Coronary Angiography;  Surgeon: Peter M Swaziland, MD;  Location[facility] INVASIVE CV LAB;  Service: Cardiovascular;  Laterality: N/A;  . TEE WITHOUT CARDIOVERSION N/A 04/29/2017   Procedure: TRANSESOPHAGEAL ECHOCARDIOGRAM (TEE);  Surgeon: [name], MD;  Location: [facility] OR;  Service: Open Heart Surgery;  Laterality: N/A;   HPI:  71 yo male former smoker with hx remote AVR 1983 on chronic coumadin, COPD, LV dysfunction with EF 30-35%, dilated ascending aorta who initially presented 6/18 for elective surgical repair. Underwent ascending aortic replacement/wheat procedure with CABG. Was progressing well post op and ambulating in hall. However just after midnight 6/20 had VT arrest with CPR and shock x 3. Intubated during ACLS.  Right pneumothorax with chest tube x2. ETT 6/20-6/21; reintubated 6/21-6/24, reintubated 6/26-7/1.      Recommendation/Prognosis  Clinical  Impression:   SLP Visit Diagnosis: Dysphagia, pharyngeal phase (R13.13) Impact on safety and function: Mild aspiration risk   Swallow Evaluation Recommendations:   Regular diet, thin liquids    Prognosis:      Individuals Consulted: Consulted and Agree with Results and Recommendations: Patient;RN (primary SLP)      SLP Assessment/Plan  Plan:      Short Term Goals: Week 1: SLP Short Term Goal 1 (Week 1): Pt will consume current diet without overt s/s of aspiration and supervision cues for use of compensatory swallow strategies.  SLP Short Term Goal 2 (Week 1): Pt will consume trials of ice chips without overt s/s of aspiration to demonstrate readiness for instrumental study. SLP Short Term Goal 3 (Week 1): Pt will use speech intelligibility strategies with Min A to increase vocal intensity to achieve ~ 75% intelligiblity at the sentence level.  SLP Short Term Goal 4 (Week 1): Patient will perform EMST for 25 repetitions with supervision verbal cues and a self-percieved effort level of 7/10.     General: Type of Study: FEES-Fiberoptic Endoscopic Evaluation of Swallow Previous Swallow Assessment: MBS 7/10 - recommend regular with nectar thick liquids  Diet Prior to this Study: Regular;Nectar-thick liquids Temperature Spikes Noted: No History of Recent Intubation: Yes Length of Intubations (days): 11 days Date extubated: 05/12/17 Oral Cavity - Dentition: Dentures, top;Dentures, bottom Self-Feeding Abilities: Able to feed self Baseline Vocal Quality: Hoarse (very mildly hoarse) Volitional Cough: Strong Volitional Swallow: Able to elicit Anatomy:  (very mild errythema and indentation on posterior VF) Pharyngeal Secretions: Normal   Reason for Referral:        Oral  Phase:  WNL   Pharyngeal Phase:   WNL   Cervical Esophageal Phase   WNL   GN          Thadd Apuzzo, Riley Nearing 05/30/2017, 10:17 AM

## 2017-05-30 NOTE — Progress Notes (Signed)
Physical Therapy Discharge Summary  Patient Details  Name: Jack Joseph MRN: [id number] Date of Birth: 06-15-1946  Today's Date: 05/30/2017 PT Individual Time: 7893-8101 PT Individual Time Calculation (min): 45 min    Patient has met 7 of 7 long term goals due to improved activity tolerance, improved balance and increased strength.  Patient to discharge at an ambulatory level Modified Independent.   Patient's care partner is independent to provide the necessary physical assistance at discharge.  Reasons goals not met: N/A  Recommendation:  Patient will benefit from ongoing skilled PT services in home health setting to continue to advance safe functional mobility, address ongoing impairments in balance, decreased endurance, weakness and minimize fall risk.  Equipment: RW  Reasons for discharge: treatment goals met  Patient/family agrees with progress made and goals achieved: Yes   Therapy interventions: Session focused on transfer training, ambulating on uneven surfaces using RW, dynamic balance during gait and functional tasks all Mod I. Pt requires supervision for car transfers and stair navigation using B handrail. Discussed the importance of using a RW for ambulation at all times when the patient goes home secondary to balance impairments. Discussed the findings of the Berg Balance test which reinforced the need to continue using RW. Explained to the pt that ambulating without the RW is something to work on only in therapy with a therapist and that at home using a RW is the safest. Pt ambulated back to room 150 ft mod I and transferred to recliner mod I. Pt left seated in recliner with call bell in reach.   PT Discharge Precautions/Restrictions Precautions Precautions: Fall;Sternal Precaution Comments: lifevest Restrictions Weight Bearing Restrictions: No Vital Signs Therapy Vitals Temp: 97.9 F (36.6 C) Temp Source: Oral Pulse Rate: 95 Resp: 18 BP: (!) 97/58 Patient  Position (if appropriate): Sitting Oxygen Therapy SpO2: 93 % O2 Device: Not Delivered Pain  Pt denies pain this session  Cognition Overall Cognitive Status: Within Functional Limits for tasks assessed Arousal/Alertness: Awake/alert Orientation Level: Oriented X4 Attention: Sustained Sustained Attention: Appears intact Memory: Appears intact Awareness: Appears intact Problem Solving: Appears intact Safety/Judgment: Appears intact Sensation Sensation Light Touch: Appears Intact Stereognosis: Appears Intact Hot/Cold: Appears Intact Proprioception: Appears Intact Coordination Gross Motor Movements are Fluid and Coordinated: Yes Fine Motor Movements are Fluid and Coordinated: Yes Coordination and Movement Description: B LEs Heel Shin Test: [facility] Motor  Motor Motor: Within Functional Limits  Trunk/Postural Assessment  Cervical Assessment Cervical Assessment: Exceptions to [facility] (forward head posture) Thoracic Assessment Thoracic Assessment: Exceptions to [facility] (kyphosis) Lumbar Assessment Lumbar Assessment: Exceptions to [facility] (posterior pelvic tilt) Postural Control Postural Control: Within Functional Limits  Balance Berg Balance Test Sit to Stand: Able to stand without using hands and stabilize independently Standing Unsupported: Able to stand safely 2 minutes Sitting with Back Unsupported but Feet Supported on Floor or Stool: Able to sit safely and securely 2 minutes Stand to Sit: Controls descent by using hands Transfers: Able to transfer safely, minor use of hands Standing Unsupported with Eyes Closed: Able to stand 10 seconds safely Standing Ubsupported with Feet Together: Able to place feet together independently and stand 1 minute safely From Standing, Reach Forward with Outstretched Arm: Can reach confidently >25 cm (10") From Standing Position, Pick up Object from Floor: Able to pick up shoe, needs supervision From Standing Position, Turn to Look Behind Over each  Shoulder: Looks behind one side only/other side shows less weight shift Turn 360 Degrees: Able to turn 360 degrees safely in 4 seconds  or less Standing Unsupported, Alternately Place Feet on Step/Stool: Able to complete 4 steps without aid or supervision Standing Unsupported, One Foot in Front: Able to plae foot ahead of the other independently and hold 30 seconds Standing on One Leg: Able to lift leg independently and hold equal to or more than 3 seconds Total Score: 48 Extremity Assessment  RLE Assessment RLE Assessment: Within Functional Limits LLE Assessment LLE Assessment: Within Functional Limits   See Function Navigator for Current Functional Status.  Netta Corrigan, PT, DPT 05/30/2017, 1:10 PM

## 2017-05-30 NOTE — Progress Notes (Signed)
ANTICOAGULATION CONSULT NOTE - Follow Up Consult  Pharmacy Consult for coumadin Indication: mechanical aortic valve  Allergies  Allergen Reactions  . Morphine And Related Itching  . No Known Allergies     Patient Measurements: Weight: 132 lb 9.5 oz (60.1 kg) Heparin Dosing Weight:   Vital Signs: Temp: 98.1 F (36.7 C) (07/19 0457) Temp Source: Oral (07/19 0457) BP: 108/54 (07/19 0457) Pulse Rate: 86 (07/19 0457)  Labs:  Recent Labs  05/27/17 0752 05/27/17 0902 05/28/17 0342 05/29/17 0458 05/30/17 0449  HGB 10.3*  --   --   --   --   HCT 33.3*  --   --   --   --   PLT 365  --   --   --   --   LABPROT  --   --  35.3* 29.6* 24.8*  INR  --   --  3.42 2.75 2.20  CREATININE  --  0.76  --  0.76  --     Estimated Creatinine Clearance: 71 mL/min (by C-G formula based on SCr of 0.76 mg/dL).   Medications:  Scheduled:  . amiodarone  200 mg Oral Daily  . feeding supplement (PRO-STAT SUGAR FREE 64)  30 mL Oral BID  . losartan  12.5 mg Oral Daily  . magic mouthwash  5 mL Oral TID  . pantoprazole  40 mg Oral Daily  . sodium chloride flush  10-40 mL Intracatheter Q12H  . traMADol  50 mg Oral Q6H  . traZODone  25 mg Oral QHS  . warfarin  4 mg Oral ONCE-1800  . Warfarin - Pharmacist Dosing Inpatient   Does not apply q1800   Infusions:    Assessment: 71 yo male with mechanical aortic valve is currently on subtherapeutic coumadin.  INR today is down to 2.2.  On amiodarone  Goal of Therapy:  INR 2.5-3.5 Monitor platelets by anticoagulation protocol: Yes   Plan:  - coumadin 4 mg pox 1 - INR in am  Persephanie Laatsch, Tsz-Yin 05/30/2017,7:51 AM

## 2017-05-31 LAB — BASIC METABOLIC PANEL
ANION GAP: 8 (ref 5–15)
BUN: 17 mg/dL (ref 6–20)
CALCIUM: 8.7 mg/dL — AB (ref 8.9–10.3)
CO2: 30 mmol/L (ref 22–32)
CREATININE: 0.91 mg/dL (ref 0.61–1.24)
Chloride: 101 mmol/L (ref 101–111)
GFR calc Af Amer: >60 mL/min (ref >60)
GLUCOSE: 99 mg/dL (ref 65–99)
Potassium: 3.9 mmol/L (ref 3.5–5.1)
Sodium: 139 mmol/L (ref 135–145)

## 2017-05-31 LAB — CBC WITH DIFFERENTIAL/PLATELET
BASOS ABS: 0 10*3/uL (ref 0.0–0.1)
BASOS PCT: 0 %
EOS ABS: 0.1 10*3/uL (ref 0.0–0.7)
EOS PCT: 2 %
HCT: 32.1 % — ABNORMAL LOW (ref 39.0–52.0)
Hemoglobin: 9.8 g/dL — ABNORMAL LOW (ref 13.0–17.0)
Lymphocytes Relative: 16 %
Lymphs Abs: 1.1 10*3/uL (ref 0.7–4.0)
MCH: 28.4 pg (ref 26.0–34.0)
MCHC: 30.5 g/dL (ref 30.0–36.0)
MCV: 93 fL (ref 78.0–100.0)
MONO ABS: 0.9 10*3/uL (ref 0.1–1.0)
MONOS PCT: 13 %
NEUTROS ABS: 4.9 10*3/uL (ref 1.7–7.7)
Neutrophils Relative %: 69 %
PLATELETS: 274 10*3/uL (ref 150–400)
RBC: 3.45 MIL/uL — ABNORMAL LOW (ref 4.22–5.81)
RDW: 15 % (ref 11.5–15.5)
WBC: 7.1 10*3/uL (ref 4.0–10.5)

## 2017-05-31 LAB — PROTIME-INR
INR: 2.3
PROTHROMBIN TIME: 25.7 s — AB (ref 11.4–15.2)

## 2017-05-31 MED ORDER — PANTOPRAZOLE SODIUM 40 MG PO TBEC: 40.0000 mg | DELAYED_RELEASE_TABLET | Freq: Every day | ORAL | 0 refills | Status: DC

## 2017-05-31 MED ORDER — WARFARIN SODIUM 4 MG PO TABS: 4.0000 mg | ORAL_TABLET | Freq: Every day | ORAL | 0 refills | Status: DC

## 2017-05-31 MED ORDER — TRAZODONE HCL 50 MG PO TABS: 25.0000 mg | ORAL_TABLET | Freq: Every evening | ORAL | 0 refills | Status: DC | PRN

## 2017-05-31 MED ORDER — LOSARTAN POTASSIUM 25 MG PO TABS: 12.5000 mg | ORAL_TABLET | Freq: Every day | ORAL | 0 refills | Status: DC

## 2017-05-31 MED ORDER — AMIODARONE HCL 200 MG PO TABS: 200.0000 mg | ORAL_TABLET | Freq: Every day | ORAL | 0 refills | Status: DC

## 2017-05-31 MED ORDER — WARFARIN SODIUM 2 MG PO TABS: 4.0000 mg | ORAL_TABLET | Freq: Once | ORAL | Status: DC

## 2017-05-31 MED ORDER — TRAMADOL HCL 50 MG PO TABS: 50.0000 mg | ORAL_TABLET | Freq: Four times a day (QID) | ORAL | 0 refills | Status: DC | PRN

## 2017-05-31 NOTE — Progress Notes (Signed)
ANTICOAGULATION CONSULT NOTE - Follow Up Consult  Pharmacy Consult for coumadin Indication: mechanical aortic valve  Allergies  Allergen Reactions  . Morphine And Related Itching  . No Known Allergies     Patient Measurements: Weight: 132 lb 9.5 oz (60.1 kg) Heparin Dosing Weight:   Vital Signs: Temp: 98.1 F (36.7 C) (07/20 0449) Temp Source: Oral (07/20 0449) BP: 116/61 (07/20 0449) Pulse Rate: 84 (07/20 0449)  Labs:  Recent Labs  05/29/17 0458 05/30/17 0449 05/31/17 0433  HGB  --   --  9.8*  HCT  --   --  32.1*  PLT  --   --  274  LABPROT 29.6* 24.8* 25.7*  INR 2.75 2.20 2.30  CREATININE 0.76  --  0.91    Estimated Creatinine Clearance: 62.4 mL/min (by C-G formula based on SCr of 0.91 mg/dL).   Medications:  Scheduled:  . amiodarone  200 mg Oral Daily  . feeding supplement (PRO-STAT SUGAR FREE 64)  30 mL Oral BID  . losartan  12.5 mg Oral Daily  . magic mouthwash  5 mL Oral TID  . pantoprazole  40 mg Oral Daily  . sodium chloride flush  10-40 mL Intracatheter Q12H  . traMADol  50 mg Oral Q6H  . traZODone  25 mg Oral QHS  . warfarin  4 mg Oral ONCE-1800  . Warfarin - Pharmacist Dosing Inpatient   Does not apply q1800   Infusions:    Assessment: 71 yo male with mechanical aortic valve is currently on slightly subtherapeutic coumadin.  INR today is 2.3  Goal of Therapy:  INR 2.5-3.5 Monitor platelets by anticoagulation protocol: Yes   Plan:  - coumadin 4 mg po x1 - INR in am  Zanae Kuehnle, Tsz-Yin 05/31/2017,7:49 AM

## 2017-05-31 NOTE — Progress Notes (Signed)
Subjective/Complaints: Pt seen laying in bed this AM.  He slept well overnight and is ready for discharge.    ROS: Denies CP, SOB, N/V/D  Objective: Vital Signs: Blood pressure 116/61, pulse 84, temperature 98.1 F (36.7 C), temperature source Oral, resp. rate 18, weight 60.1 kg (132 lb 9.5 oz), SpO2 91 %. No results found. Results for orders placed or performed during the hospital encounter of 05/23/17 (from the past 72 hour(s))  Protime-INR     Status: Abnormal   Collection Time: 05/29/17  4:58 AM  Result Value Ref Range   Prothrombin Time 29.6 (H) 11.4 - 15.2 seconds   INR 1.44   Basic metabolic panel     Status: Abnormal   Collection Time: 05/29/17  4:58 AM  Result Value Ref Range   Sodium 136 135 - 145 mmol/L   Potassium 3.7 3.5 - 5.1 mmol/L   Chloride 99 (L) 101 - 111 mmol/L   CO2 29 22 - 32 mmol/L   Glucose, Bld 91 65 - 99 mg/dL   BUN 21 (H) 6 - 20 mg/dL   Creatinine, Ser 0.76 0.61 - 1.24 mg/dL   Calcium 8.5 (L) 8.9 - 10.3 mg/dL   GFR calc non Af Amer >60 >60 mL/min   GFR calc Af Amer >60 >60 mL/min    Comment: (NOTE) The eGFR has been calculated using the CKD EPI equation. This calculation has not been validated in all clinical situations. eGFR's persistently <60 mL/min signify possible Chronic Kidney Disease.    Anion gap 8 5 - 15  Protime-INR     Status: Abnormal   Collection Time: 05/30/17  4:49 AM  Result Value Ref Range   Prothrombin Time 24.8 (H) 11.4 - 15.2 seconds   INR 2.20   Protime-INR     Status: Abnormal   Collection Time: 05/31/17  4:33 AM  Result Value Ref Range   Prothrombin Time 25.7 (H) 11.4 - 15.2 seconds   INR 3.15   Basic metabolic panel     Status: Abnormal   Collection Time: 05/31/17  4:33 AM  Result Value Ref Range   Sodium 139 135 - 145 mmol/L   Potassium 3.9 3.5 - 5.1 mmol/L   Chloride 101 101 - 111 mmol/L   CO2 30 22 - 32 mmol/L   Glucose, Bld 99 65 - 99 mg/dL   BUN 17 6 - 20 mg/dL   Creatinine, Ser 0.91 0.61 - 1.24 mg/dL   Calcium 8.7 (L) 8.9 - 10.3 mg/dL   GFR calc non Af Amer >60 >60 mL/min   GFR calc Af Amer >60 >60 mL/min    Comment: (NOTE) The eGFR has been calculated using the CKD EPI equation. This calculation has not been validated in all clinical situations. eGFR's persistently <60 mL/min signify possible Chronic Kidney Disease.    Anion gap 8 5 - 15  CBC with Differential/Platelet     Status: Abnormal   Collection Time: 05/31/17  4:33 AM  Result Value Ref Range   WBC 7.1 4.0 - 10.5 K/uL   RBC 3.45 (L) 4.22 - 5.81 MIL/uL   Hemoglobin 9.8 (L) 13.0 - 17.0 g/dL   HCT 32.1 (L) 39.0 - 52.0 %   MCV 93.0 78.0 - 100.0 fL   MCH 28.4 26.0 - 34.0 pg   MCHC 30.5 30.0 - 36.0 g/dL   RDW 15.0 11.5 - 15.5 %   Platelets 274 150 - 400 K/uL   Neutrophils Relative % 69 %   Neutro Abs 4.9  1.7 - 7.7 K/uL   Lymphocytes Relative 16 %   Lymphs Abs 1.1 0.7 - 4.0 K/uL   Monocytes Relative 13 %   Monocytes Absolute 0.9 0.1 - 1.0 K/uL   Eosinophils Relative 2 %   Eosinophils Absolute 0.1 0.0 - 0.7 K/uL   Basophils Relative 0 %   Basophils Absolute 0.0 0.0 - 0.1 K/uL    Gen: NAD. Vital signs reviewed.  HEENT: hoarse voice. Normocephalic, atraumatic Cardio: RRR and no JVD. Resp: CTA B/L and Unlabored.  GI: BS positive and ND Musc/Skel:  No edema, no tenderness. Neuro: Alert Motor 4+/5 throughout (stable).  Skin:   Intact. Warm and dry. Psych: Normal mood and affect.   Assessment/Plan: 1. Functional deficits secondary to Debility, s/p cardiac arrest which require 3+ hours per day of interdisciplinary therapy in a comprehensive inpatient rehab setting. Physiatrist is providing close team supervision and 24 hour management of active medical problems listed below. Physiatrist and rehab team continue to assess barriers to discharge/monitor patient progress toward functional and medical goals. FIM: Function - Bathing Position: Wheelchair/chair at sink Body parts bathed by patient: Right arm, Left arm, Front  perineal area, Buttocks, Right upper leg, Left upper leg, Right lower leg, Left lower leg, Chest, Abdomen Body parts bathed by helper: Back Bathing not applicable: Back Assist Level: Supervision or verbal cues  Function- Upper Body Dressing/Undressing What is the patient wearing?: Pull over shirt/dress Bra - Perfomed by patient: Thread/unthread right bra strap Bra - Perfomed by helper: Hook/unhook bra (pull down sports bra), Thread/unthread left bra strap (life vest) Pull over shirt/dress - Perfomed by patient: Thread/unthread right sleeve, Thread/unthread left sleeve, Put head through opening, Pull shirt over trunk Assist Level: More than reasonable time Function - Lower Body Dressing/Undressing What is the patient wearing?: Underwear, Pants, Socks, Shoes, Non-skid slipper socks Position: Wheelchair/chair at [employer] - Performed by patient: Thread/unthread right underwear leg, Thread/unthread left underwear leg, Pull underwear up/down Pants- Performed by patient: Thread/unthread left pants leg, Thread/unthread right pants leg, Pull pants up/down Pants- Performed by helper: Pull pants up/down Non-skid slipper socks- Performed by patient: Don/doff right sock, Don/doff left sock Socks - Performed by patient: Don/doff right sock, Don/doff left sock Shoes - Performed by patient: Don/doff right shoe, Don/doff left shoe, Fasten right, Fasten left TED Hose - Performed by helper: Don/doff right TED hose, Don/doff left TED hose Assist for footwear: Supervision/touching assist Assist for lower body dressing: More than reasonable time  Function - Toileting Toileting steps completed by patient: Adjust clothing prior to toileting, Performs perineal hygiene, Adjust clothing after toileting Toileting steps completed by helper: Adjust clothing prior to toileting, Performs perineal hygiene, Adjust clothing after toileting Assist level: More than reasonable time  Function - Air cabin crew  transfer assistive device: Elevated toilet seat/BSC over toilet Assist level to toilet: No Help, no cues, assistive device, takes more than a reasonable amount of time Assist level from toilet: No Help, no cues, assistive device, takes more than a reasonable amount of time Assist level to bedside commode (at bedside): Touching or steadying assistance (Pt > 75%) Assist level from bedside commode (at bedside): Touching or steadying assistance (Pt > 75%)  Function - Chair/bed transfer Chair/bed transfer activity did not occur: Safety/medical concerns Chair/bed transfer method: Ambulatory Chair/bed transfer assist level: No Help, no cues, assistive device, takes more than a reasonable amount of time Chair/bed transfer assistive device: Armrests, Walker Chair/bed transfer details: Verbal cues for precautions/safety, Tactile cues for sequencing, Tactile cues for  weight shifting, Tactile cues for posture  Function - Locomotion: Wheelchair Will patient use wheelchair at discharge?: No Type: Manual Wheelchair activity did not occur: Safety/medical concerns (decreased activity tolerance, unstable vitals (HR and SpO2)) Assist Level: Dependent (Pt equals 0%) (for transport) Wheel 50 feet with 2 turns activity did not occur: Safety/medical concerns Wheel 150 feet activity did not occur: Safety/medical concerns Turns around,maneuvers to table,bed, and toilet,negotiates 3% grade,maneuvers on rugs and over doorsills: No Function - Locomotion: Ambulation Ambulation activity did not occur: Refused (pain) Assistive device: Walker-rolling Max distance: 300 ft Assist level: No help, No cues, assistive device, takes more than a reasonable amount of time Assist level: No help, No cues, assistive device, takes more than a reasonable amount of time Walk 50 feet with 2 turns activity did not occur: Safety/medical concerns (decreased activity tolerance, unstable vitals (HR and SpO2)) Assist level: No help, No cues,  assistive device, takes more than a reasonable amount of time Walk 150 feet activity did not occur: Safety/medical concerns Assist level: No help, No cues, assistive device, takes more than a reasonable amount of time Walk 10 feet on uneven surfaces activity did not occur: Safety/medical concerns Assist level: No help, No cues, assistive device, takes more than a reasonable amount of time  Function - Comprehension Comprehension: Auditory Comprehension assist level: Follows complex conversation/direction with no assist  Function - Expression Expression: Verbal Expression assist level: Expresses complex 90% of the time/cues < 10% of the time  Function - Social Interaction Social Interaction assist level: Interacts appropriately with others - No medications needed.  Function - Problem Solving Problem solving assist level: Solves basic problems with no assist  Function - Memory Memory assist level: Recognizes or recalls 90% of the time/requires cueing < 10% of the time Patient normally able to recall (first 3 days only): Current season, Staff names and faces  Medical Problem List and Plan: 1. Deconditioningsecondary to aortic root repair with postoperative cardiac arrest  D/c today  Will see patient for transitional care management in 1-2 weeks. 2. Replacement of Ascending aorta/Wheat procedure/DVT Prophylaxis/Anticoagulation: Pharmaceutical: Coumadin             INR therapeutic on 7/20 3. Pain Management: tylenol prn 4. Mood: LCSW to follow for evaluation and support.  5. Neuropsych: This patient is fullycapable of making decisions on hisown behalf. 6. Skin/Wound Care: routine pressure relief measures.  7. Fluids/Electrolytes/Nutrition: Strict I/Os. Check daily weights.              BMP within acceptable range on 7/20 8.Chronic systolic CHF with Hypotension: No BB due to advanced HF/low EF and No ACE/ARB due to labile BP. There were no vitals filed for this visit. 9. Abnormal  LFTs: Resolved             will continue to monitor.No statins.  10. Leucocytosis: Resolved             Monitor for signs of infections. Diarrhea resolved. 11. ABLA:              Hb 9.8 on 7/20             Cont to monitor             Hemoccult neg 12. VT arrest s/p DCCV X 3 t: On amiodarone 200 mg daily. Continue to monitor LFTs. Left Vest in place.  13. Dysphagia: dysphagia 3, nectar liquids with aspiration precautions advanced to regular diet nectar liquids.  Advance diet as tolerated 14. Hypoxia: Continues to be limited by DOE/Hypoxia. Continue nebs tid. Encourage IS while awake             Weaned supplemental O2              CXR 7/12 reviewed, b/l pleural effusions - Cards consulted, Lasix initiated.  Appreciate recs 15. Hypoalbuminemia             Supplement initiated 7/13  LOS (Days) 8 A FACE TO FACE EVALUATION WAS PERFORMED  Ankit Lorie Phenix 05/31/2017, 7:02 AM

## 2017-05-31 NOTE — Progress Notes (Signed)
Patient discharged. Nurse tech escorted patient and his wife to lobby with discharged instructions and belonging in his possession.

## 2017-05-31 NOTE — Discharge Summary (Signed)
Physician Discharge Summary  Patient ID: Tytus Strahle MRN: [id number] DOB/AGE: 1946/01/22 71 y.o.  Admit date: 05/23/2017 Discharge date: 05/31/2017  Discharge Diagnoses:  Principal Problem:   Debility Active Problems:   Acute blood loss anemia   History of cardiac arrest   Dysphagia   Hypoalbuminemia due to protein-calorie malnutrition (HCC)   Pleural effusion   Hypoxia   Discharged Condition: stable   Significant Diagnostic Studies: Dg Chest 2 View  Result Date: 05/23/2017 CLINICAL DATA:  Postop aortic valve replacement. EXAM: CHEST  2 VIEW COMPARISON:  05/20/2017 and previous FINDINGS: More pleural fluid on the left, moderate in size, layering dependently, with left lower lobe atelectasis. Smaller effusion on the right with mild right base volume loss. 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 Aortic valve replacement appears the same. Right arm PICC tip in the SVC just above the right atrium. IMPRESSION: Moderate size left effusion with left lower lobe collapse. Smaller right effusion with mild right base volume loss. Electronically Signed   By: Paulina Fusi M.D.   On: 05/23/2017 07:40               Labs:  Basic Metabolic Panel:  Recent Labs Lab 05/25/17 0453 05/27/17 0902 05/29/17 0458 05/31/17 0433  NA 135 136 136 139  K 4.0 4.4 3.7 3.9  CL 96* 99* 99* 101  CO2 34* 30 29 30   GLUCOSE 101* 112* 91 99  BUN 11 16 21* 17  CREATININE 0.73 0.76 0.76 0.91  CALCIUM 8.3* 8.6* 8.5* 8.7*    CBC: CBC Latest Ref Rng & Units 05/31/2017 05/27/2017 05/24/2017  WBC 4.0 - 10.5 K/uL 7.1 7.8 9.1  Hemoglobin 13.0 - 17.0 g/dL 4.0(J) 10.3(L) 9.5(L)  Hematocrit 39.0 - 52.0 % 32.1(L) 33.3(L) 31.3(L)  Platelets 150 - 400 K/uL 274 365 462(H)    CBG: No results for input(s): GLUCAP in the last 168 hours.  Brief HPI:   Khylin Gutridge Grooveris a 71 y.o.malewith history of CAD s/pCABG, chronic systolic CHF s/p AVR '83- chronic  coumadin, COPD who was admitted on 04/29/17 for redo sternotomy and repair of ascending aortic dilation/Wheat procedure. Post op course complicated by VT with PEA arrest requiring CPR/ACLS protocol and intubation, R-PTX with large amount of subcutaneous emphysema right lateral chest, hypotension requiring pressors, severe left atelectasis with mucous plugging requiring reintubation and hypoxemia. Dr. Ladona Ridgel consulted for input and anticipated ICD insertion prior to discharge to home. He tolerated extubation on 7/1 and was started on dysphagia 3, nectar liquids on 7/3 due to trace aspiration of thins.  Lifevest ordered as repeat echo with severe global reduction in LVF with diffuse hypokineses and EF 20-25%. He was noted to be deconditioned with cognitive deficits affecting mobility and ability to carry out ADL tasks.  CIR recommended for follow up therapy.    Hospital Course: Merwin Breden was admitted to rehab 05/23/2017 for inpatient therapies to consist of PT, ST and OT at least three hours five days a week. Past admission physiatrist, therapy team and rehab RN have worked together to provide customized collaborative inpatient rehab. He was maintained on modified diet initially. He was noted to be significantly hypoxic and was treated with dose of IV lasix. Xopenex was added and IS was encouraged. His respiratory status has improved and he was weaned off oxygen. His blood pressures were monitored on bid basis and have been soft therefore lasix was not scheduled. No signs of overload noted and weight is  down to 60 kg. Po intake has improved and renal status is stable. Abnormal LFTs have resolved.  Protein supplement was added to help with low calorie malnutrition and to promote wound healing.   Dysphagia treatment has been ongoing and he was advanced to thin liquids prior to discharge.  FEES done revealing CBC has been monitored as on coumadin and H/H has been relatively stable. Pharmacy has been  assisting with management of coumadin and INR is 2.3 at discharge. Coumadin was increased to 4 mg daily and HHRN to draw protime on 7/23 with results to coumadin clinic.  Sternal incision is intact and healing well without s/s of infection.  Heart rate has been stable on amiodarone.    He has made great progress with mobility and is modified independent at discharge. He will continue to receive follow up HHPT, HHOT  and HHRN by [facility] after discharge.   Rehab course: During patient's stay in rehab weekly team conferences were held to monitor patient's progress, set goals and discuss barriers to discharge. At admission, patient required min assist with mobility and had increased WOB limiting mobility/activity.  He required min assist with basic selfcare tasks. He has had improvement in activity tolerance, balance, postural control, as well as ability to compensate for deficits. He is able to complete ADL tasks at sink at modified independent level. Supervision is recommended with showers.   Family education was completed with wife regarding all aspects of care and mobility.    Disposition: 01-Home or Self Care  Diet: Heart Healthy.   Special Instructions: 1. Continue sternal precautions. No lifting, driving or strenuous activity till cleared by MD.  2. HHRN to draw protime on 7/23 with results to coumadin clinic. INR goal of 2.5.  3. Wear LifeVest at all times. N 4. Keep appt with Dr. Swaziland 8/14 at 1:40 pm and Dr. Tyrone Sage 8/16 at 10:30 am   Allergies as of 05/31/2017      Reactions   Morphine And Related Itching   No Known Allergies       Medication List    STOP taking these medications   colchicine 0.6 MG tablet   food thickener Powd Commonly known as:  THICK IT   guaiFENesin 100 MG/5ML Soln Commonly known as:  ROBITUSSIN   levalbuterol 0.63 MG/3ML nebulizer solution Commonly known as:  XOPENEX   loperamide 1 MG/5ML solution Commonly known as:  IMODIUM     TAKE  these medications   amiodarone 200 MG tablet Commonly known as:  PACERONE Take 1 tablet (200 mg total) by mouth daily.   diphenhydrAMINE 12.5 MG/5ML elixir Commonly known as:  BENADRYL Take 5 mLs (12.5 mg total) by mouth at bedtime as needed for sleep.   losartan 25 MG tablet Commonly known as:  COZAAR Take 0.5 tablets (12.5 mg total) by mouth daily.   pantoprazole 40 MG tablet Commonly known as:  PROTONIX Take 1 tablet (40 mg total) by mouth daily.   traZODone 50 MG tablet Commonly known as:  DESYREL Take 0.5 tablets (25 mg total) by mouth at bedtime as needed for sleep.   warfarin 4 MG tablet Commonly known as:  COUMADIN Take 1 tablet (4 mg total) by mouth daily at 6 PM. What changed:  medication strength  how much to take  when to take this      Follow-up Information    Sw[place]iland, Betty G, MD Follow up on 06/12/2017.   Specialty:  Family Medicine Why:  Appointment @ 12:00  Contact information: [address] [place] [address] [phone]        Marcello Fennel, MD Follow up.   Specialty:  Physical Medicine and Rehabilitation Why:  office will call you with follow up appointment Contact information: [address] [place] [address] [phone]           Signed: Jacquelynn Cree 05/31/2017, 6:25 PM

## 2017-06-03 ENCOUNTER — Telehealth: Payer: Self-pay | Admitting: Cardiology

## 2017-06-03 ENCOUNTER — Ambulatory Visit (INDEPENDENT_AMBULATORY_CARE_PROVIDER_SITE_OTHER): Payer: Medicare Other | Admitting: Interventional Cardiology

## 2017-06-03 ENCOUNTER — Telehealth: Payer: Self-pay | Admitting: Physical Medicine & Rehabilitation

## 2017-06-03 DIAGNOSIS — Z95828 Presence of other vascular implants and grafts: Secondary | ICD-10-CM

## 2017-06-03 DIAGNOSIS — Z5181 Encounter for therapeutic drug level monitoring: Secondary | ICD-10-CM

## 2017-06-03 DIAGNOSIS — Z952 Presence of prosthetic heart valve: Secondary | ICD-10-CM

## 2017-06-03 LAB — POCT INR: INR: 5.7

## 2017-06-03 NOTE — Telephone Encounter (Signed)
Tomma Lightning PT with [facility] did initial evaluation with patient and she would like to get verbal on continued orders.  Her frequency is 3w1, 2w2.  Please call her at [phone].

## 2017-06-03 NOTE — Telephone Encounter (Signed)
Received a call from [facility] with [facility] calling to get a verbal order for INR.Advised ok.She will fax results.

## 2017-06-03 NOTE — Telephone Encounter (Signed)
Approval given

## 2017-06-04 ENCOUNTER — Telehealth: Payer: Self-pay | Admitting: *Deleted

## 2017-06-04 NOTE — Telephone Encounter (Signed)
Transitional Care call-I spoke with Mr Califf    1. Are you/is patient experiencing any problems since coming home? Are there any questions regarding any aspect of care? No 2. Are there any questions regarding medications administration/dosing? Are meds being taken as prescribed? Patient should review meds with caller to confirm He has medications. Home Care has been out and reviewed as well 3. Have there been any falls? NO 4. Has Home Health been to the house and/or have they contacted you? If not, have you tried to contact them? Can we help you contact them? They have been out 5. Are bowels and bladder emptying properly? Are there any unexpected incontinence issues? If applicable, is patient following bowel/bladder programs? NO 6. Any fevers, problems with breathing, unexpected pain? NO 7. Are there any skin problems or new areas of breakdown? NO 8. Has the patient/family member arranged specialty MD follow up (ie cardiology/neurology/renal/surgical/etc)?  Can we help arrange? appt given with Dr Allena Katz 9. Does the patient need any other services or support that we can help arrange? NO 10. Are caregivers following through as expected in assisting the patient? Yes 11. Has the patient quit smoking, drinking alcohol, or using drugs as recommended? N/A  Appointment Thursday 06/13/17 arrive at 1:00 for a 1:20 with Dr Allena Katz Address reviewed [phone]

## 2017-06-05 ENCOUNTER — Telehealth (HOSPITAL_COMMUNITY): Payer: Self-pay

## 2017-06-05 NOTE — Telephone Encounter (Signed)
Patient insurance is active and benefits verified. Patient has Medicare A/B - no co-payment, deductible $183.00/$183.00 has been met, 20% co-insurance, no pre-authorization. Passport/reference [phone].  Patient will be contacted and scheduled after their follow up appointment with the cardiologist on 06/25/17 and surgeon 06/27/17, upon review by [facility] RN navigator.  Referral is currently on hold. Patient is currently receiving HHPT and HHOT at this time. Insurance will not cover these services and cardiac rehab at the same time.

## 2017-06-07 ENCOUNTER — Encounter: Payer: Self-pay | Admitting: *Deleted

## 2017-06-07 ENCOUNTER — Telehealth: Payer: Self-pay | Admitting: Physical Medicine & Rehabilitation

## 2017-06-07 NOTE — Telephone Encounter (Signed)
Patient's wife has questions about medications and heart rate.  Please call her.

## 2017-06-07 NOTE — Telephone Encounter (Signed)
Jack Joseph was asking about MR Groovers losartan and taking cochicine and amiodarone.  I told her that she would need to speak with Dr Swaziland about the cardiac questions.  She will call his office.

## 2017-06-10 ENCOUNTER — Ambulatory Visit (INDEPENDENT_AMBULATORY_CARE_PROVIDER_SITE_OTHER): Payer: Self-pay

## 2017-06-10 DIAGNOSIS — Z952 Presence of prosthetic heart valve: Secondary | ICD-10-CM

## 2017-06-10 DIAGNOSIS — Z5181 Encounter for therapeutic drug level monitoring: Secondary | ICD-10-CM

## 2017-06-10 DIAGNOSIS — Z95828 Presence of other vascular implants and grafts: Secondary | ICD-10-CM

## 2017-06-10 LAB — POCT INR: INR: 7.2

## 2017-06-10 LAB — PROTIME-INR: INR: 6.5 — AB (ref 0.9–1.1)

## 2017-06-11 NOTE — Progress Notes (Signed)
HPI:   [name] is a 71 y.o. male, who is here today with his wife to follow on recent hospitalization.   She was last seen on 07/10/2016.  She was recently discharged home from inpatient rehab, 05/31/17. 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  Postop course was complicated by V. tach and PEA arrest (05/01/17), he required CPR, shocks x 3,and intubation. Small right pneumothorax noted after CPR, so chest tube was placed and transferred to ICU. Did not tolerate extubated 06/21. Extubated 05/05/17 but intubated again 05/07/17 after broncho aspiration episode. Extubated on 05/12/17. LLL HCAP, completed 10 days of abx. ICU delirium resolved. Diarrhea during hospitalization, resolved. Negative work-up,including C diff.   Started on Amiodarone, which dose has been decreased prior to discharge and now on 200 mg daily. Abnormal LFT's that improved during hospital stay.  Lab Results  Component Value Date   ALT 23 05/24/2017   AST 20 05/24/2017   ALKPHOS 85 05/24/2017   BILITOT 0.5 05/24/2017    He was transferred to inpatient rehabilitation on 05/23/2017 and discharged home on 05/31/2017.  Anemia: He denies gross hematuria, blood in the stool,/,gum bleeding, or increasing bruising.  Lab Results  Component Value Date   WBC 7.1 05/31/2017   HGB 9.8 (L) 05/31/2017   HCT 32.1 (L) 05/31/2017   MCV 93.0 05/31/2017   PLT 274 05/31/2017   He has an appointment tomorrow with Dr. Allena Katz, rehabilitation.  Appointment with cardiologists on 06/25/2017 and ICP evaluation 06/27/2017.  Echo: LVH with diffuse hypokinesis and EF of 20-25% on 05/20/17. He is on Losartan 12.5 mg daily. Home BP's low 100's/60's and a few < 100/60.  He has home health arrangements, PT twice per week and nurse visit once per week for Coumadin/INR check. He has progressed from walker to cane, he doesn't use his cane all the time, he denies  falls since hospital discharge. Rest of ADL's independent. He has a shower and toile chair.  History of insomnia, he is taking Trazodone 50 mg at the time daily symptoms. He has not been sleeping well, which he attributes to wires from monitor he is currently wearing.   In general he feels like he is progressively improving. Appetite has improved, dysphagia resolved.  Exertional dyspnea stable, no orthopnea or PND. LE edema improved. He is weighting daily and wt has been otherwise stable. He is not driving.   Review of Systems  Constitutional: Positive for fatigue. Negative for chills, diaphoresis and fever.  HENT: Negative for nosebleeds, sore throat and trouble swallowing.   Eyes: Negative for redness and visual disturbance.  Respiratory: Positive for shortness of breath. Negative for cough and wheezing.   Cardiovascular: Positive for leg swelling. Negative for chest pain and palpitations.  Gastrointestinal: Negative for abdominal pain, diarrhea, nausea and vomiting.  Genitourinary: Negative for decreased urine volume, dysuria and hematuria.  Musculoskeletal: Positive for gait problem. Negative for myalgias.  Skin: Negative for rash and wound.  Neurological: Negative for syncope, weakness, light-headedness and headaches.  Hematological: Negative for adenopathy. Does not bruise/bleed easily.  Psychiatric/Behavioral: Positive for sleep disturbance. Negative for confusion. The patient is not nervous/anxious.       Current Outpatient Prescriptions on File Prior to Visit  Medication Sig Dispense Refill  . amiodarone (PACERONE) 200 MG tablet Take 1 tablet (200 mg total) by mouth daily. 30 tablet 0  . losartan (COZAAR) 25 MG tablet Take 0.5 tablets (12.5 mg total) by  mouth daily. 30 tablet 0  . warfarin (COUMADIN) 4 MG tablet Take 1 tablet (4 mg total) by mouth daily at 6 PM. 30 tablet 0   No current facility-administered medications on file prior to visit.      Past Medical  History:  Diagnosis Date  . AVD (aortic valve disease)   . Chronic anticoagulation   . Dilated cardiomyopathy (HCC)    EF 30-35%  . Dysrhythmia   . Hyperlipidemia   . LV dysfunction    Allergies  Allergen Reactions  . Morphine And Related Other (See Comments)    Hallucinations     Social History   Social History  . Marital status: Married    Spouse name: N/A  . Number of children: N/A  . Years of education: N/A   Social History Main Topics  . Smoking status: Former Smoker    Quit date: 11/12/2000  . Smokeless tobacco: Never Used  . Alcohol use No  . Drug use: No  . Sexual activity: Yes   Other Topics Concern  . None   Social History Narrative  . None    Vitals:   06/12/17 1158  BP: 100/62  Pulse: 76  Resp: 12  O2 sat at RA 98% Body mass index is 19.8 kg/m.   Physical Exam  Nursing note and vitals reviewed. Constitutional: He is oriented to person, place, and time. He appears well-developed. No distress.  HENT:  Head: Atraumatic.  Mouth/Throat: Oropharynx is clear and moist and mucous membranes are normal. He has dentures.  Eyes: Pupils are equal, round, and reactive to light. Conjunctivae and EOM are normal.  Cardiovascular: Normal rate.  An irregular rhythm present.  No murmur heard. Pulses:      Dorsalis pedis pulses are 2+ on the right side, and 2+ on the left side.  Click present.  Respiratory: Effort normal and breath sounds normal. No respiratory distress.  GI: Soft. He exhibits no mass. There is no hepatomegaly. There is no tenderness.  Musculoskeletal: He exhibits edema (1+ pitting edema, bilateral LE). He exhibits no tenderness.  Lymphadenopathy:    He has no cervical adenopathy.  Neurological: He is alert and oriented to person, place, and time. He has normal strength.  Stable gait assisted with a cane.  Skin: Skin is warm. No erythema.  Psychiatric: He has a normal mood and affect.  Well groomed, good eye contact.    ASSESSMENT AND  PLAN:   [name] was seen today for hospitalization follow-up.  Diagnoses and all orders for this visit:  Unstable gait  Fall prevention discussed. Continue PT.  Congestive heart failure, unspecified HF chronicity, unspecified heart failure type [facility])  Continue following with cardiologists. Symptoms stable. Continue Losartan for now. Low salt diet,coaution with fluid intake, and instructed about warning signs.  Hypotension, unspecified hypotension type  Adequate hydration. Continue monitoring BP, asymptomatic. For now no changes in Losartan but may need to be discontinued if BP's < 100/60 is persistent.  VT (ventricular tachycardia) (HCC)  During hospitalization. Amiodarone some side effects discussed. He is wearing heart monitor and has appt with cardiologists.  Other iron deficiency anemia  Will plan on labs next OV if not checked before ICD evaluation/procedure. Instructed about warning signs.   Insomnia, unspecified type  Good sleep hygiene can be difficult with wiring around chest and back. Trazodone side effects discussed.    Derrell Milanes G. [place], MD  [facility]. [place] office.

## 2017-06-12 ENCOUNTER — Encounter: Payer: Self-pay | Admitting: Family Medicine

## 2017-06-12 ENCOUNTER — Ambulatory Visit (INDEPENDENT_AMBULATORY_CARE_PROVIDER_SITE_OTHER): Payer: Medicare Other | Admitting: Family Medicine

## 2017-06-12 ENCOUNTER — Telehealth: Payer: Self-pay | Admitting: Family Medicine

## 2017-06-12 VITALS — BP 100/62 | HR 76 | Resp 12 | Ht 70.0 in | Wt 138.0 lb

## 2017-06-12 DIAGNOSIS — D508 Other iron deficiency anemias: Secondary | ICD-10-CM | POA: Diagnosis not present

## 2017-06-12 DIAGNOSIS — I472 Ventricular tachycardia, unspecified: Secondary | ICD-10-CM

## 2017-06-12 DIAGNOSIS — R2681 Unsteadiness on feet: Secondary | ICD-10-CM

## 2017-06-12 DIAGNOSIS — I959 Hypotension, unspecified: Secondary | ICD-10-CM

## 2017-06-12 DIAGNOSIS — G4700 Insomnia, unspecified: Secondary | ICD-10-CM | POA: Diagnosis not present

## 2017-06-12 DIAGNOSIS — I509 Heart failure, unspecified: Secondary | ICD-10-CM | POA: Diagnosis not present

## 2017-06-12 DIAGNOSIS — D509 Iron deficiency anemia, unspecified: Secondary | ICD-10-CM | POA: Insufficient documentation

## 2017-06-12 NOTE — Patient Instructions (Signed)
A few things to remember from today's visit:   Congestive heart failure, unspecified HF chronicity, unspecified heart failure type (HCC)  Hypotension, unspecified hypotension type  For prevention. Keep appointments with cardiologists.  I will not check labs today, we need to follow on anemia next visit.  Please be sure medication list is accurate. If a new problem present, please set up appointment sooner than planned today.

## 2017-06-12 NOTE — Telephone Encounter (Signed)
Pt wife want to know will you accept her ask a pt.

## 2017-06-12 NOTE — Telephone Encounter (Signed)
Okay for patient's wife to establish care?

## 2017-06-12 NOTE — Telephone Encounter (Signed)
It is fine with me

## 2017-06-13 ENCOUNTER — Encounter: Payer: Self-pay | Admitting: Physical Medicine & Rehabilitation

## 2017-06-13 ENCOUNTER — Encounter: Payer: Medicare Other | Attending: Physical Medicine & Rehabilitation | Admitting: Physical Medicine & Rehabilitation

## 2017-06-13 VITALS — BP 107/63 | HR 90

## 2017-06-13 DIAGNOSIS — Z95828 Presence of other vascular implants and grafts: Secondary | ICD-10-CM | POA: Diagnosis not present

## 2017-06-13 DIAGNOSIS — R5381 Other malaise: Secondary | ICD-10-CM

## 2017-06-13 DIAGNOSIS — Z7901 Long term (current) use of anticoagulants: Secondary | ICD-10-CM | POA: Insufficient documentation

## 2017-06-13 DIAGNOSIS — Z048 Encounter for examination and observation for other specified reasons: Secondary | ICD-10-CM | POA: Diagnosis present

## 2017-06-13 DIAGNOSIS — I5022 Chronic systolic (congestive) heart failure: Secondary | ICD-10-CM | POA: Insufficient documentation

## 2017-06-13 DIAGNOSIS — I959 Hypotension, unspecified: Secondary | ICD-10-CM | POA: Insufficient documentation

## 2017-06-13 DIAGNOSIS — Z952 Presence of prosthetic heart valve: Secondary | ICD-10-CM

## 2017-06-13 DIAGNOSIS — J449 Chronic obstructive pulmonary disease, unspecified: Secondary | ICD-10-CM | POA: Diagnosis not present

## 2017-06-13 DIAGNOSIS — Z8674 Personal history of sudden cardiac arrest: Secondary | ICD-10-CM

## 2017-06-13 DIAGNOSIS — I5042 Chronic combined systolic (congestive) and diastolic (congestive) heart failure: Secondary | ICD-10-CM | POA: Diagnosis not present

## 2017-06-13 DIAGNOSIS — I97121 Postprocedural cardiac arrest following other surgery: Secondary | ICD-10-CM | POA: Diagnosis not present

## 2017-06-13 DIAGNOSIS — I2510 Atherosclerotic heart disease of native coronary artery without angina pectoris: Secondary | ICD-10-CM | POA: Insufficient documentation

## 2017-06-13 DIAGNOSIS — Z9889 Other specified postprocedural states: Secondary | ICD-10-CM | POA: Insufficient documentation

## 2017-06-13 DIAGNOSIS — R269 Unspecified abnormalities of gait and mobility: Secondary | ICD-10-CM | POA: Diagnosis not present

## 2017-06-13 NOTE — Progress Notes (Signed)
Subjective:    Patient ID: Jack Joseph, male    DOB: 15-Aug-1946, 71 y.o.   MRN: [id number]  HPI 71 y.o. male with history of CAD s/pCABG, chronic systolic CHF s/p AVR '83- chronic coumadin, COPD presents for transitional care management after receiving CIR for for redo sternotomy and repair of ascending aortic dilation/Wheat procedure.  Admit date: 05/23/2017 Discharge date: 05/31/2017  At discharge, he was instructed to maintain sternal precautions, which he has been. His INR is being followed by Cards. He is wearing his lifevest.  He sees Cards and CT Surg 2 weeks.  Denies falls. No issues with swallowing. Denies respiratory issues. Denies pain.   Therapies: 2/week. Mobility: Cane in community. DME: Already had  Pain Inventory Average Pain 1 Pain Right Now 1 My pain is na  In the last 24 hours, has pain interfered with the following? General activity 0 Relation with others 0 Enjoyment of life 0 What TIME of day is your pain at its worst? na Sleep (in general) Fair  Pain is worse with: wearing a device Pain improves with: na Relief from Meds: na  Mobility walk with assistance use a cane ability to climb steps?  yes do you drive?  no  Function disabled: date disabled 2018  Neuro/Psych No problems in this area  Prior Studies Any changes since last visit?  no  Physicians involved in your care Any changes since last visit?  no   Family History  Problem Relation Age of Onset  . Heart disease Brother    Social History   Social History  . Marital status: Married    Spouse name: N/A  . Number of children: N/A  . Years of education: N/A   Social History Main Topics  . Smoking status: Former Smoker    Quit date: 11/12/2000  . Smokeless tobacco: Never Used  . Alcohol use No  . Drug use: No  . Sexual activity: Yes   Other Topics Concern  . None   Social History Narrative  . None   Past Surgical History:  Procedure Laterality Date  . AORTIC  VALVE REPLACEMENT    . BENTALL PROCEDURE N/A 04/29/2017   Procedure: REPLACEMENT ASCENDING AORTA/ WHEAT PROCEDURE, HYPOTHERMIC CIRCULATORY ARREST AND CARDIOPULMONARY BYPASS, AND USE OF HEMASHIELD PLATINUM WOVEN DOUBLE VELOUR VASCULAR GRAFT 32 MM X 50 CM;  Surgeon: [name], MD;  Location: [facility] OR;  Service: Open Heart Surgery;  Laterality: N/A;  . DOPPLER ECHOCARDIOGRAPHY  04/07/2002   EF 30-35%  . RIGHT/LEFT HEART CATH AND CORONARY ANGIOGRAPHY N/A 03/05/2017   Procedure: Right/Left Heart Cath and Coronary Angiography;  Surgeon: Peter M Swaziland, MD;  Location: [facility] INVASIVE CV LAB;  Service: Cardiovascular;  Laterality: N/A;  . TEE WITHOUT CARDIOVERSION N/A 04/29/2017   Procedure: TRANSESOPHAGEAL ECHOCARDIOGRAM (TEE);  Surgeon: [name], MD;  Location: [facility] OR;  Service: Open Heart Surgery;  Laterality: N/A;   Past Medical History:  Diagnosis Date  . AVD (aortic valve disease)   . Chronic anticoagulation   . Dilated cardiomyopathy (HCC)    EF 30-35%  . Dysrhythmia   . Hyperlipidemia   . LV dysfunction    BP 107/63   Pulse 90   SpO2 95%   Opioid Risk Score:   Fall Risk Score:  `1  Depression screen PHQ 2/9  Depression screen [facility] 2/9 06/13/2017 10/25/2016 07/10/2016  Decreased Interest 0 0 0  Down, Depressed, Hopeless 0 0 0  PHQ - 2 Score 0 0 0  Altered sleeping 2 - -  Tired, decreased energy 3 - -  Change in appetite 0 - -  Feeling bad or failure about yourself  0 - -  Trouble concentrating 0 - -  Moving slowly or fidgety/restless 0 - -  Suicidal thoughts 0 - -  PHQ-9 Score 5 - -  Difficult doing work/chores Not difficult at all - -     Review of Systems  Constitutional: Negative.   HENT: Negative.   Eyes: Negative.   Respiratory: Negative.        DOE  Cardiovascular: Negative.   Gastrointestinal: Negative.   Endocrine: Negative.   Genitourinary: Negative.   Musculoskeletal: Positive for gait problem.  Skin: Negative.   Allergic/Immunologic: Negative.     Hematological: Negative.   Psychiatric/Behavioral: Negative.   All other systems reviewed and are negative.      Objective:   Physical Exam Gen: NAD. Vital signs reviewed.  HEENT: hoarse voice. Normocephalic, atraumatic Cardio: RRR and no JVD. Resp: CTA B/L and Unlabored.  GI: BS positive and ND Musc/Skel:  No edema, no tenderness. Neuro: Alert Motor 4+/5 throughout.  Skin:   Intact. Warm and dry. Psych: Normal mood and affect.     Assessment & Plan:  71 y.o. male with history of CAD s/pCABG, chronic systolic CHF s/p AVR '83- chronic coumadin, COPD presents for transitional care management after receiving CIR for for redo sternotomy and repair of ascending aortic dilation/Wheat procedure.  1. Deconditioning secondary to aortic root repair with postoperative cardiac arrest  Cont therapies  Cont follow with Cards/CTS  Cont Lifevest  2. Replacement of Ascending aorta/Wheat procedure/DVT Prophylaxis/Anticoagulation:   Cont coumadin  INR being managed and meds being adjusted by Cards.  4. Chronic systolic CHF with Hypotension  Meds per Cards  5. Abnormality of gait  Cont cane for safety in community

## 2017-06-17 ENCOUNTER — Ambulatory Visit (INDEPENDENT_AMBULATORY_CARE_PROVIDER_SITE_OTHER): Payer: Self-pay

## 2017-06-17 DIAGNOSIS — Z95828 Presence of other vascular implants and grafts: Secondary | ICD-10-CM

## 2017-06-17 DIAGNOSIS — Z5181 Encounter for therapeutic drug level monitoring: Secondary | ICD-10-CM

## 2017-06-17 DIAGNOSIS — Z952 Presence of prosthetic heart valve: Secondary | ICD-10-CM

## 2017-06-17 LAB — POCT INR: INR: 5

## 2017-06-24 ENCOUNTER — Ambulatory Visit (INDEPENDENT_AMBULATORY_CARE_PROVIDER_SITE_OTHER): Payer: Medicare Other | Admitting: Pharmacist

## 2017-06-24 ENCOUNTER — Telehealth: Payer: Self-pay | Admitting: Cardiology

## 2017-06-24 DIAGNOSIS — Z95828 Presence of other vascular implants and grafts: Secondary | ICD-10-CM

## 2017-06-24 DIAGNOSIS — Z952 Presence of prosthetic heart valve: Secondary | ICD-10-CM

## 2017-06-24 DIAGNOSIS — Z5181 Encounter for therapeutic drug level monitoring: Secondary | ICD-10-CM

## 2017-06-24 LAB — PROTIME-INR: INR: 2.1 — AB (ref <=1.1)

## 2017-06-24 NOTE — Telephone Encounter (Signed)
New message    Beth, RN from [facility] is calling to report that today is her last visit with the pt. She said his heart rate would be irregular and then ok. She said it fluctuated from 50- upper 90's.

## 2017-06-24 NOTE — Progress Notes (Signed)
Jack Joseph Date of Birth: 1946-07-15   History of Present Illness: Jack Joseph is seen today for post hospital follow up.  He is status post aortic valve replacement with a Bjork Shiley valve in [place] in [place]. He is on chronic Coumadin therapy. He has a history of dilated cardiomyopathy with prior ejection fraction of 30-35%.   When seen previously he noted a 13 lb weight loss since December. He thinks it was just because he isn't eating as much. Denies change in appetite or bowels. No bleeding. No chest pain. Some chronic dyspnea. Quit smoking 2002. To further evaluate his weigh loss labs were drawn. LDL was 121, TSH mildly elevated 5.39. CBC, CMET, PSA were normal. CXR showed a left hilar rounded mass and CT was recommended. 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 An Echo was also obtained showing normal functioning of his prosthetic AV. It confirmed the ascending aortic aneurysm. LV function was severely reduced with EF 25-30%. There was mild pulmonary HTN. Cardiac cath showed mild nonobstructive CAD with normal right heart pressures and cardiac output. The prosthetic AV disc moved well on fluoro.   He was seen and evaluated by Dr. Tyrone Sage. Surgery for his large thoracic aneurysm was recommended. On 04/29/17 he underwent a Wheat procedure with replacement of his ascending aorta. The prior AV prosthesis was left in place. His post op course was complicated. He was initially extubated on the evening of surgery and inotropes were weaned. On the next day he developed pulseless VT with cardiac arrest. He required cardioversion, CPR, and reintubation. He was loaded on IV amiodarone. He developed a pneumothorax and had a chest tube placed. On POD #3 he was once again extubated. He developed subcutaneous emphysema on the right and had a second chest tube placed. He required reintubation again due to mucus  plugging. On POD#6 he was extubated again. On POD #8 his subcutaneous emphysema worsened and his right chest tube had to be replaced. He required transfusion on 2 occasions for anemia. On POD #8 he had increased atelectasis. He had bronchoscopy. Breathing again worsened and he was reintubated. He required TPN for nutritional support and IV pressors for BP support. He improved and was able to be extubated on POD #13 and chest tubes removed on POD #16. Diet was advanced and he was transitioned to coumadin. He also developed diarrhea and was C. Diff positive. This was treated. On July 12 he was transferred to inpatient Rehab for PT/OT. He continued to have SOB and hypoxia treated with lasix and Xopenex. This improved and he was weaned off oxygen. He was DC home on July 20 on coumadin, amiodarone, and losartan. Lifevest was ordered. Echo on July 9 showed EF 20-25%. Dr. Ladona Ridgel recommended consideration for ICD implant 4-6 weeks after discharge.   On follow up today he is seen with his wife. He is doing well. Notes he is tired and fatigues easily. Has completed home PT. Breathing is better. No surgical pain. No fever. Appetite is good. No diarrhea. BP at home is 90-113 systolic. Pulse reading 47-98 with irregularity. No dizziness or syncope. Some swelling in feet. Wears compression hose.    Current Outpatient Prescriptions on File Prior to Visit  Medication Sig Dispense Refill  . amiodarone (PACERONE) 200 MG tablet Take 1 tablet (200 mg total) by mouth daily. 30 tablet 0  . losartan (COZAAR) 25 MG tablet Take 0.5  tablets (12.5 mg total) by mouth daily. 30 tablet 0  . warfarin (COUMADIN) 4 MG tablet Take 1 tablet (4 mg total) by mouth daily at 6 PM. 30 tablet 0   No current facility-administered medications on file prior to visit.     Allergies  Allergen Reactions  . Morphine And Related Other (See Comments)    Hallucinations     Past Medical History:  Diagnosis Date  . AVD (aortic valve disease)    . Chronic anticoagulation   . Dilated cardiomyopathy (HCC)    EF 30-35%  . Dysrhythmia   . Hyperlipidemia   . LV dysfunction     Past Surgical History:  Procedure Laterality Date  . AORTIC VALVE REPLACEMENT    . BENTALL PROCEDURE N/A 04/29/2017   Procedure: REPLACEMENT ASCENDING AORTA/ WHEAT PROCEDURE, HYPOTHERMIC CIRCULATORY ARREST AND CARDIOPULMONARY BYPASS, AND USE OF HEMASHIELD PLATINUM WOVEN DOUBLE VELOUR VASCULAR GRAFT 32 MM X 50 CM;  Surgeon: [name], MD;  Location: [facility] OR;  Service: Open Heart Surgery;  Laterality: N/A;  . DOPPLER ECHOCARDIOGRAPHY  04/07/2002   EF 30-35%  . RIGHT/LEFT HEART CATH AND CORONARY ANGIOGRAPHY N/A 03/05/2017   Procedure: Right/Left Heart Cath and Coronary Angiography;  Surgeon: Peter M Swazil[place], MD;  Location: [facility] INVASIVE CV LAB;  Service: Cardiovascular;  Laterality: N/A;  . TEE WITHOUT CARDIOVERSION N/A 04/29/2017   Procedure: TRANSESOPHAGEAL ECHOCARDIOGRAM (TEE);  Surgeon: [name], MD;  Location[facility] OR;  Service: Open Heart Surgery;  Laterality: N/A;    History  Smoking Status  . Former Smoker  . Quit date: 11/12/2000  Smokeless Tobacco  . Never Used    History  Alcohol Use No    Family History  Problem Relation Age of Onset  . Heart disease Brother     Review of Systems: As noted in history of present illness.  All other systems were reviewed and are negative.  Physical Exam: BP (!) 108/44   Pulse 89   Ht 5\' 10"  (1.778 m)   Wt 141 lb 9.6 oz (64.2 kg)   BMI 20.32 kg/m  GENERAL:  Well appearing, thin WM in NAD. Wearing a Lifevest.  HEENT:  PERRL, EOMI, sclera are clear. Oropharynx is clear. NECK:  No jugular venous distention, carotid upstroke brisk and symmetric, no bruits, no thyromegaly or adenopathy LUNGS:  Clear to auscultation bilaterally CHEST:  Unremarkable, surgical incisions have healed well.  HEART:  RRR with frequent extrasystoles,  PMI not displaced or sustained,S1  within normal limits, good  mechanical AV click, no S3, no S4: no clicks, no rubs, no murmurs ABD:  Soft, nontender. BS +, no masses. + mid line bruit. No hepatomegaly, no splenomegaly EXT:  2 + pulses throughout, 1+ edema, no cyanosis no clubbing SKIN:  Warm and dry.  No rashes NEURO:  Alert and oriented x 3. Cranial nerves II through XII intact. PSYCH:  Cognitively intact    LABORATORY DATA:  Lab Results  Component Value Date   WBC 7.1 05/31/2017   HGB 9.8 (L) 05/31/2017   HCT 32.1 (L) 05/31/2017   PLT 274 05/31/2017   GLUCOSE 99 05/31/2017   CHOL 199 01/22/2017   TRIG 71 01/22/2017   HDL 64 01/22/2017   LDLCALC 121 (H) 01/22/2017   ALT 23 05/24/2017   AST 20 05/24/2017   NA 139 05/31/2017   K 3.9 05/31/2017   CL 101 05/31/2017   CREATININE 0.91 05/31/2017   BUN 17 05/31/2017   CO2 30 05/31/2017   TSH  4.065 05/09/2017   INR 2.1 (A) 06/24/2017   HGBA1C 5.7 (H) 04/25/2017   Ecg today shows NSR with frequent PVCs in pattern of bigeminy. LAD. Nonspecific IVCD. T wave abnormality c/w inferolateral ischemia. QTc 481 msec.   Echo 05/20/17:Study Conclusions  - Procedure narrative: Transthoracic echocardiography. Image   quality was poor. The study was technically difficult, as a   result of poor sound wave transmission. - Left ventricle: The cavity size was mildly dilated. Wall   thickness was normal. Systolic function was severely reduced. The   estimated ejection fraction was in the range of 20% to 25%.   Diffuse hypokinesis. Features are consistent with a pseudonormal   left ventricular filling pattern, with concomitant abnormal   relaxation and increased filling pressure (grade 2 diastolic   dysfunction). - Aortic valve: A mechanical prosthesis was present. - Mitral valve: There was mild regurgitation. - Pericardium, extracardiac: There was a left pleural effusion.  Impressions:  - Severe global reduction in LV systolic function; moderate   diastolic dysfunction; mild LVE; s/p AVR with  normal mean   gradient; mildly dilated aortic root; mild MR.   Assessment / Plan: 1. 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 He is on chronic Coumadin therapy.  Good click on exam. Good valve function by Echo. Coumadin being adjusted by pharmacy. INR 2.1 yesterday.  2. Dilated cardiomyopathy. Ejection fraction of 20-25% post Aortic aneurysm repair.  Patient is class 1-2. Only on losartan 12.5 mg daily. I don't think we can titrate meds now due to soft BP.   3. Thoracic aortic aneurysm. Ascending aorta. 6.3 cm.  Now s/p Wheat procedure with replacement. 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 Post op course very complicated due to development of VT, acute respiratory failure, subQ emphysema, multiple chest tubes, anemia, and deconditioning. He clinically looks to be doing well now.   4. VT following Aortic aneurysm surgery. Low EF. On Lifevest. Recommend ICD placement 4-6 weeks post discharge per Dr. Ladona Ridgel. Will arrange follow up with Dr. Ladona Ridgel on August 29. Informed him he cannot drive for 6 months. Ecg shows frequent PVCs with bigeminy resulting in undercounting of pulse. Will defer to Dr. Ladona Ridgel whether to continue amiodarone long term.   5. Abdominal bruit. On exam aorta does not feel aneurysmal. Will eventually need imaging of abdominal aorta to rule out AAA.

## 2017-06-24 NOTE — Telephone Encounter (Signed)
Patient has an appointment tomorrow with dr [place], will make him aware.

## 2017-06-25 ENCOUNTER — Ambulatory Visit (INDEPENDENT_AMBULATORY_CARE_PROVIDER_SITE_OTHER): Payer: Medicare Other | Admitting: Cardiology

## 2017-06-25 ENCOUNTER — Encounter: Payer: Self-pay | Admitting: Cardiology

## 2017-06-25 VITALS — BP 108/44 | HR 89 | Ht 70.0 in | Wt 141.6 lb

## 2017-06-25 DIAGNOSIS — I712 Thoracic aortic aneurysm, without rupture, unspecified: Secondary | ICD-10-CM

## 2017-06-25 DIAGNOSIS — Z95828 Presence of other vascular implants and grafts: Secondary | ICD-10-CM

## 2017-06-25 DIAGNOSIS — I5022 Chronic systolic (congestive) heart failure: Secondary | ICD-10-CM | POA: Diagnosis not present

## 2017-06-25 DIAGNOSIS — I359 Nonrheumatic aortic valve disorder, unspecified: Secondary | ICD-10-CM | POA: Diagnosis not present

## 2017-06-25 DIAGNOSIS — I420 Dilated cardiomyopathy: Secondary | ICD-10-CM | POA: Diagnosis not present

## 2017-06-25 DIAGNOSIS — Z952 Presence of prosthetic heart valve: Secondary | ICD-10-CM | POA: Diagnosis not present

## 2017-06-25 NOTE — Telephone Encounter (Signed)
Left message on wife's VM to call back and schedule new pt appt.

## 2017-06-25 NOTE — Patient Instructions (Signed)
We will schedule you for follow up with Dr. Ladona Ridgel for ICD implant  Continue your current therapy  I will see you in 2 months.

## 2017-06-25 NOTE — Addendum Note (Signed)
Addended by: Neoma Laming on: 06/25/2017 02:41 PM   Modules accepted: Orders

## 2017-06-26 ENCOUNTER — Other Ambulatory Visit: Payer: Self-pay | Admitting: Cardiothoracic Surgery

## 2017-06-26 DIAGNOSIS — Z95828 Presence of other vascular implants and grafts: Secondary | ICD-10-CM

## 2017-06-26 NOTE — Progress Notes (Signed)
[address]       [place] [address]             [phone]      [facility] Medical Record #[id number] Date of Birth: Apr 14, 1946  Referring: [place], Peter M, MD Primary Care: [place], Betty G, MD  Chief Complaint:   POST OP FOLLOW UP 04/29/2017   OPERATIVE REPORT PREOPERATIVE DIAGNOSIS:  Status post mechanical aortic valve replacement in 1983, now with 6.3 to 6.5 cm ascending aortic dilatation with left ventricular dysfunction. POSTOPERATIVE DIAGNOSIS:  Status post mechanical aortic valve replacement in 1983, now with 6.3 to 6.5 cm ascending aortic dilatation with left ventricular dysfunction. SURGICAL PROCEDURES:  Replacement of ascending aorta/Wheat procedure with hypothermic circulatory arrest and cardiopulmonary bypass with a 32 mm Dacron Hemashield woven graft.  History of Present Illness:     Patient returns to the office today in follow-up after his recent replacement of his ascending aorta. The patient's initial surgical intervention went well, he is extubated ambulating in the hall well, on the second postop night after surgery he had a pulseless VT arrest, CPR was done and is course became complicated by right pneumothorax and respiratory failure. Ultimately he improved and has since been discharged from inpatient rehabilitation and home physical therapy. He is now returning to near-normal activities at home though he does get fatigued. He has a LifeVest on and because of cardiac arrest he has not been driving an automobile.  He is to see EP next week to discuss implantable defibrillator and also long-term use of amiodarone.     Past Medical History:  Diagnosis Date  . AVD (aortic valve disease)   . Chronic anticoagulation   . Dilated cardiomyopathy (HCC)    EF 30-35%  . Dysrhythmia   . Hyperlipidemia   . LV dysfunction      History  Smoking Status  . Former Smoker  . Quit date: 11/12/2000  Smokeless Tobacco  .  Never Used    History  Alcohol Use No     Allergies  Allergen Reactions  . Morphine And Related Other (See Comments)    Hallucinations     Current Outpatient Prescriptions  Medication Sig Dispense Refill  . amiodarone (PACERONE) 200 MG tablet Take 1 tablet (200 mg total) by mouth daily. 30 tablet 0  . losartan (COZAAR) 25 MG tablet Take 0.5 tablets (12.5 mg total) by mouth daily. 30 tablet 0  . warfarin (COUMADIN) 4 MG tablet Take 1 tablet (4 mg total) by mouth daily at 6 PM. 30 tablet 0   No current facility-administered medications for this visit.        Physical Exam: BP (!) 107/55   Pulse (!) 45   Resp 20   Ht 5\' 10"  (1.778 m)   Wt 143 lb (64.9 kg)   SpO2 97% Comment: RA  BMI 20.52 kg/m   General appearance: alert and cooperative Neurologic: intact Heart: regularly irregular rhythm Lungs: clear to auscultation bilaterally Abdomen: soft, non-tender; bowel sounds normal; no masses,  no organomegaly Extremities: edema Mild lower extremity edema Wound: Sternum is stable and well healed   Diagnostic Studies & Laboratory data:     Recent Radiology Findings:   No results found.    Recent Lab Findings: Lab Results  Component Value Date   WBC 7.1 05/31/2017   HGB 9.8 (L) 05/31/2017   HCT 32.1 (L) 05/31/2017   PLT 274 05/31/2017   GLUCOSE 99 05/31/2017  CHOL 199 01/22/2017   TRIG 71 01/22/2017   HDL 64 01/22/2017   LDLCALC 121 (H) 01/22/2017   ALT 23 05/24/2017   AST 20 05/24/2017   NA 139 05/31/2017   K 3.9 05/31/2017   CL 101 05/31/2017   CREATININE 0.91 05/31/2017   BUN 17 05/31/2017   CO2 30 05/31/2017   TSH 4.065 05/09/2017   INR 2.1 (A) 06/24/2017   HGBA1C 5.7 (H) 04/25/2017      Assessment / Plan:      Patient stable after prolonged postoperative course after redo ascending aortic replacement, complicated by VT arrest Patient still have follow-up with EP next week I plan to see him back in 3 months       [name]  MD      [address] [place] [address] Office [phone]   Beeper [phone]  06/27/2017 11:08 AM

## 2017-06-27 ENCOUNTER — Ambulatory Visit (INDEPENDENT_AMBULATORY_CARE_PROVIDER_SITE_OTHER): Payer: Self-pay | Admitting: Cardiothoracic Surgery

## 2017-06-27 ENCOUNTER — Ambulatory Visit
Admission: RE | Admit: 2017-06-27 | Discharge: 2017-06-27 | Disposition: A | Discharge status: Home / Self Care | Payer: Medicare Other | Source: Ambulatory Visit | Attending: Cardiothoracic Surgery | Admitting: Cardiothoracic Surgery

## 2017-06-27 ENCOUNTER — Encounter: Payer: Self-pay | Admitting: Cardiothoracic Surgery

## 2017-06-27 VITALS — BP 107/55 | HR 45 | Resp 20 | Ht 70.0 in | Wt 143.0 lb

## 2017-06-27 DIAGNOSIS — Z95828 Presence of other vascular implants and grafts: Secondary | ICD-10-CM

## 2017-06-27 DIAGNOSIS — I712 Thoracic aortic aneurysm, without rupture, unspecified: Secondary | ICD-10-CM

## 2017-07-01 ENCOUNTER — Other Ambulatory Visit: Payer: Self-pay | Admitting: Physical Medicine & Rehabilitation

## 2017-07-01 ENCOUNTER — Telehealth: Payer: Self-pay | Admitting: Internal Medicine

## 2017-07-01 ENCOUNTER — Other Ambulatory Visit: Payer: Self-pay | Admitting: *Deleted

## 2017-07-01 MED ORDER — AMIODARONE HCL 200 MG PO TABS: 200.0000 mg | ORAL_TABLET | Freq: Every day | ORAL | 0 refills | Status: DC

## 2017-07-01 NOTE — Telephone Encounter (Signed)
Telephone   07/01/2017 [facility]  Marinus Maw, MD  Cardiology   Conversation  (Newest Message First)  Wiliam Ke, RN  to Me    07/01/17 3:37 PM  Give him 30 days, I don't believe he can cold [place] amio. Then we will see what Dr. Ladona Ridgel says.      07/01/17 3:09 PM  You routed this conversation to Wiliam Ke, RN  Me     07/01/17 3:07 PM  Note    Patient has an appointment on 07/10/17 with Dr Ladona Ridgel. Patient has seen Dr [place]aziland but per his note he will defer to Dr Ladona Ridgel whether to continue amiodarone long term. Please advise. Thanks, MI         07/01/17 10:43 AM  Alonna Minium J routed this conversation to [facility] [address] Refill  [facility]    07/01/17 10:42 AM  Note    New message      *STAT* If patient is at the pharmacy, call can be transferred to refill team.   1. Which medications need to be refilled? (please list name of each medication and dose if known)  amiodarone (PACERONE) 200 MG tablet Take 1 tablet (200 mg total) by mouth daily.     2. Which pharmacy/location (including street and city if local pharmacy) is medication to be sent to? [facility]   3. Do they need a 30 day or 90 day supply? 90           07/01/17 10:42 AM    Baldo Ash B. Donze contacted [facility]  Additional Documentation   Encounter Info:   Billing Info,   History,   Allergies,   Detailed Report

## 2017-07-01 NOTE — Telephone Encounter (Signed)
Patient has an appointment on 07/10/17 with Dr Ladona Ridgel. Patient has seen Dr [place]iland but per his note he will defer to Dr Ladona Ridgel whether to continue amiodarone long term. Please advise. Thanks, MI

## 2017-07-01 NOTE — Telephone Encounter (Signed)
New message      *STAT* If patient is at the pharmacy, call can be transferred to refill team.   1. Which medications need to be refilled? (please list name of each medication and dose if known)  amiodarone (PACERONE) 200 MG tablet Take 1 tablet (200 mg total) by mouth daily.     2. Which pharmacy/location (including street and city if local pharmacy) is medication to be sent to? [facility] -[address]   3. Do they need a 30 day or 90 day supply? 90

## 2017-07-02 ENCOUNTER — Ambulatory Visit (INDEPENDENT_AMBULATORY_CARE_PROVIDER_SITE_OTHER): Payer: Medicare Other | Admitting: *Deleted

## 2017-07-02 DIAGNOSIS — Z95828 Presence of other vascular implants and grafts: Secondary | ICD-10-CM | POA: Diagnosis not present

## 2017-07-02 DIAGNOSIS — Z5181 Encounter for therapeutic drug level monitoring: Secondary | ICD-10-CM | POA: Diagnosis not present

## 2017-07-02 DIAGNOSIS — Z952 Presence of prosthetic heart valve: Secondary | ICD-10-CM | POA: Diagnosis not present

## 2017-07-02 DIAGNOSIS — Z7901 Long term (current) use of anticoagulants: Secondary | ICD-10-CM | POA: Diagnosis not present

## 2017-07-02 LAB — POCT INR: INR: 2.4

## 2017-07-05 ENCOUNTER — Encounter: Payer: Self-pay | Admitting: Pharmacist

## 2017-07-10 ENCOUNTER — Encounter (INDEPENDENT_AMBULATORY_CARE_PROVIDER_SITE_OTHER): Payer: Self-pay

## 2017-07-10 ENCOUNTER — Ambulatory Visit (INDEPENDENT_AMBULATORY_CARE_PROVIDER_SITE_OTHER): Payer: Medicare Other | Admitting: Internal Medicine

## 2017-07-10 ENCOUNTER — Ambulatory Visit (INDEPENDENT_AMBULATORY_CARE_PROVIDER_SITE_OTHER): Payer: Medicare Other | Admitting: Pharmacist

## 2017-07-10 ENCOUNTER — Encounter: Payer: Self-pay | Admitting: Internal Medicine

## 2017-07-10 VITALS — BP 96/62 | HR 90 | Ht 70.5 in | Wt 144.0 lb

## 2017-07-10 DIAGNOSIS — I472 Ventricular tachycardia, unspecified: Secondary | ICD-10-CM

## 2017-07-10 DIAGNOSIS — Z01818 Encounter for other preprocedural examination: Secondary | ICD-10-CM

## 2017-07-10 DIAGNOSIS — Z7901 Long term (current) use of anticoagulants: Secondary | ICD-10-CM | POA: Diagnosis not present

## 2017-07-10 DIAGNOSIS — Z5181 Encounter for therapeutic drug level monitoring: Secondary | ICD-10-CM

## 2017-07-10 DIAGNOSIS — I420 Dilated cardiomyopathy: Secondary | ICD-10-CM

## 2017-07-10 DIAGNOSIS — Z95828 Presence of other vascular implants and grafts: Secondary | ICD-10-CM

## 2017-07-10 DIAGNOSIS — Z952 Presence of prosthetic heart valve: Secondary | ICD-10-CM | POA: Diagnosis not present

## 2017-07-10 LAB — POCT INR: INR: 5.5

## 2017-07-10 NOTE — Patient Instructions (Addendum)
Medication Instructions:  Your physician recommends that you continue on your current medications as directed. Please refer to the Current Medication list given to you today.  Labwork: Please get CBC, BMET and PT/INR today.  Testing/Procedures: Your physician has recommended that you have a defibrillator inserted. An implantable cardioverter defibrillator (ICD) is a small device that is placed in your chest or, in rare cases, your abdomen. This device uses electrical pulses or shocks to help control life-threatening, irregular heartbeats that could lead the heart to suddenly stop beating (sudden cardiac arrest). Leads are attached to the ICD that goes into your heart. This is done in the hospital and usually requires an overnight stay. Please see the instruction sheet given to you today for more information.  You are scheduled for an defibrillator insert on 07/12/2017 @ 7:00 am.  Follow-Up: You will follow up with our device clinic in 10-14 days for a wound check.  You will follow up with Dr. Ladona Ridgel in 91 days after your procedure.     Any Other Special Instructions Will Be Listed Below (If Applicable).   Please arrive at the [facility] main entrance of [facility] at: 7:00 am on July 12, 2017.  Do not eat or drink after midnight prior to procedure  Do not take any medications the morning of the procedure  Do NOT take your warfarin tonight 07/10/2017 until you are instructed to resume after your procedure.   Eat leafy greens tonight and tomorrow night.  We will recheck your PT/INR the morning of your admission.   Use surgical scrub as directed.   Plan for one night stay You will need someone to drive you home at discharge   If you need a refill on your cardiac medications before your next appointment, please call your pharmacy.

## 2017-07-10 NOTE — Progress Notes (Signed)
HPI [name] returns today for ongoing follow-up of ventricular tachycardia, status post thoracic aortic aneurysm repair. He is a pleasant 71 year old man who underwent ascending aortic replacement several weeks ago, which was complicated by the development of ventricular tachycardia leading to respiratory failure and prolonged ventilator therapy. This was complicated by the development of subcutaneous emphysema. He had a persistent air leak. After a long convalescence, he returns today for additional evaluation. He has been wearing a life vest. No ICD therapies. He has been taking amiodarone. Allergies  Allergen Reactions  . Morphine And Related Other (See Comments)    Hallucinations      Current Outpatient Prescriptions  Medication Sig Dispense Refill  . amiodarone (PACERONE) 200 MG tablet Take 1 tablet (200 mg total) by mouth daily. 30 tablet 0  . losartan (COZAAR) 25 MG tablet Take 0.5 tablets (12.5 mg total) by mouth daily. 30 tablet 0  . warfarin (COUMADIN) 4 MG tablet Take 1 tablet (4 mg total) by mouth daily at 6 PM. 30 tablet 0   No current facility-administered medications for this visit.      Past Medical History:  Diagnosis Date  . AVD (aortic valve disease)   . Chronic anticoagulation   . Dilated cardiomyopathy (HCC)    EF 30-35%  . Dysrhythmia   . Hyperlipidemia   . LV dysfunction     ROS:   All systems reviewed and negative except as noted in the HPI.   Past Surgical History:  Procedure Laterality Date  . AORTIC VALVE REPLACEMENT    . BENTALL PROCEDURE N/A 04/29/2017   Procedure: REPLACEMENT ASCENDING AORTA/ WHEAT PROCEDURE, HYPOTHERMIC CIRCULATORY ARREST AND CARDIOPULMONARY BYPASS, AND USE OF HEMASHIELD PLATINUM WOVEN DOUBLE VELOUR VASCULAR GRAFT 32 MM X 50 CM;  Surgeon: [name], MD;  Location: [facility] OR;  Service: Open Heart Surgery;  Laterality: N/A;  . DOPPLER ECHOCARDIOGRAPHY  04/07/2002   EF 30-35%  . RIGHT/LEFT HEART CATH AND CORONARY  ANGIOGRAPHY N/A 03/05/2017   Procedure: Right/Left Heart Cath and Coronary Angiography;  Surgeon: Peter M Swaziland, MD;  Location: [facility] INVASIVE CV LAB;  Service: Cardiovascular;  Laterality: N/A;  . TEE WITHOUT CARDIOVERSION N/A 04/29/2017   Procedure: TRANSESOPHAGEAL ECHOCARDIOGRAM (TEE);  Surgeon: [name], MD;  Location: [facility] OR;  Service: Open Heart Surgery;  Laterality: N/A;     Family History  Problem Relation Age of Onset  . Heart disease Brother      Social History   Social History  . Marital status: Married    Spouse name: N/A  . Number of children: N/A  . Years of education: N/A   Occupational History  . Not on file.   Social History Main Topics  . Smoking status: Former Smoker    Quit date: 11/12/2000  . Smokeless tobacco: Never Used  . Alcohol use No  . Drug use: No  . Sexual activity: Yes   Other Topics Concern  . Not on file   Social History Narrative  . No narrative on file     BP 96/62   Pulse 90   Ht 5' 10.5" (1.791 m)   Wt 144 lb (65.3 kg)   SpO2 96%   BMI 20.37 kg/m   Physical Exam:  Well appearing 71 yo man, NAD HEENT: Unremarkable Neck:  6 cm JVD, no thyromegally Lymphatics:  No adenopathy Back:  No CVA tenderness Lungs:  Clear with no wheezes HEART:  Regular rate rhythm, no murmurs, no rubs, no clicks  Abd:  soft, positive bowel sounds, no organomegally, no rebound, no guarding Ext:  2 plus pulses, no edema, no cyanosis, no clubbing Skin:  No rashes no nodules Neuro:  CN II through XII intact, motor grossly intact  EKG - nsr with IVCD   Assess/Plan: 1. VT - he has had no recurrent episodes since his discharge from the hospital. He will continue amio 200 a day. We will schedule ICD insertion 2. Coags - his INR is 5 today. He is instructed to eat spinach and we will plan ICD insertion in 2 days. He will hold his coumadin until his procedure. 3. Chronic systolic heart failure - his EF is 30% he has class 2 symptoms. No indication  for a BiV device with QRS of 138.  4. Aortic aneurysm - he is s/p extraction. No change in meds.  Leonia Reeves.D.

## 2017-07-11 ENCOUNTER — Encounter: Payer: Self-pay | Admitting: Cardiology

## 2017-07-11 DIAGNOSIS — Z5181 Encounter for therapeutic drug level monitoring: Secondary | ICD-10-CM | POA: Diagnosis not present

## 2017-07-11 DIAGNOSIS — Z7901 Long term (current) use of anticoagulants: Secondary | ICD-10-CM | POA: Diagnosis not present

## 2017-07-11 DIAGNOSIS — I5022 Chronic systolic (congestive) heart failure: Secondary | ICD-10-CM | POA: Diagnosis not present

## 2017-07-11 DIAGNOSIS — Z8674 Personal history of sudden cardiac arrest: Secondary | ICD-10-CM | POA: Diagnosis not present

## 2017-07-11 DIAGNOSIS — I110 Hypertensive heart disease with heart failure: Secondary | ICD-10-CM | POA: Diagnosis not present

## 2017-07-11 DIAGNOSIS — Z48812 Encounter for surgical aftercare following surgery on the circulatory system: Secondary | ICD-10-CM | POA: Diagnosis not present

## 2017-07-11 DIAGNOSIS — R1310 Dysphagia, unspecified: Secondary | ICD-10-CM | POA: Diagnosis not present

## 2017-07-11 DIAGNOSIS — D62 Acute posthemorrhagic anemia: Secondary | ICD-10-CM | POA: Diagnosis not present

## 2017-07-11 DIAGNOSIS — Z951 Presence of aortocoronary bypass graft: Secondary | ICD-10-CM | POA: Diagnosis not present

## 2017-07-11 DIAGNOSIS — Z87891 Personal history of nicotine dependence: Secondary | ICD-10-CM

## 2017-07-11 DIAGNOSIS — I2510 Atherosclerotic heart disease of native coronary artery without angina pectoris: Secondary | ICD-10-CM | POA: Diagnosis not present

## 2017-07-11 DIAGNOSIS — Z952 Presence of prosthetic heart valve: Secondary | ICD-10-CM | POA: Diagnosis not present

## 2017-07-11 DIAGNOSIS — Z95811 Presence of heart assist device: Secondary | ICD-10-CM | POA: Diagnosis not present

## 2017-07-11 LAB — CBC
Hematocrit: 37.7 % (ref 37.5–51.0)
Hemoglobin: 12 g/dL — ABNORMAL LOW (ref 13.0–17.7)
MCH: 28.4 pg (ref 26.6–33.0)
MCHC: 31.8 g/dL (ref 31.5–35.7)
MCV: 89 fL (ref 79–97)
PLATELETS: 324 10*3/uL (ref 150–379)
RBC: 4.22 x10E6/uL (ref 4.14–5.80)
RDW: 15 % (ref 12.3–15.4)
WBC: 7.4 10*3/uL (ref 3.4–10.8)

## 2017-07-11 LAB — BASIC METABOLIC PANEL
BUN / CREAT RATIO: 13 (ref 10–24)
BUN: 15 mg/dL (ref 8–27)
CHLORIDE: 95 mmol/L — AB (ref 96–106)
CO2: 26 mmol/L (ref 20–29)
CREATININE: 1.12 mg/dL (ref 0.76–1.27)
Calcium: 9.2 mg/dL (ref 8.6–10.2)
GFR calc Af Amer: 75 mL/min/{1.73_m2} (ref >59)
GFR calc non Af Amer: 65 mL/min/{1.73_m2} (ref >59)
GLUCOSE: 74 mg/dL (ref 65–99)
Potassium: 4.5 mmol/L (ref 3.5–5.2)
SODIUM: 136 mmol/L (ref 134–144)

## 2017-07-11 LAB — PROTIME-INR
INR: 4.6 — ABNORMAL HIGH (ref 0.8–1.2)
Prothrombin Time: 43.4 s — ABNORMAL HIGH (ref 9.1–12.0)

## 2017-07-12 ENCOUNTER — Ambulatory Visit (HOSPITAL_COMMUNITY)
Admission: RE | Disposition: A | Discharge status: Home / Self Care | Payer: Self-pay | Source: Ambulatory Visit | Attending: Internal Medicine

## 2017-07-12 ENCOUNTER — Ambulatory Visit (HOSPITAL_COMMUNITY)
Admission: RE | Admit: 2017-07-12 | Discharge: 2017-07-13 | Disposition: A | Discharge status: Home / Self Care | Payer: Medicare Other | Source: Ambulatory Visit | Attending: Internal Medicine | Admitting: Internal Medicine

## 2017-07-12 ENCOUNTER — Encounter: Payer: Self-pay | Admitting: Family Medicine

## 2017-07-12 DIAGNOSIS — I472 Ventricular tachycardia, unspecified: Secondary | ICD-10-CM

## 2017-07-12 DIAGNOSIS — E785 Hyperlipidemia, unspecified: Secondary | ICD-10-CM | POA: Diagnosis not present

## 2017-07-12 DIAGNOSIS — Z7901 Long term (current) use of anticoagulants: Secondary | ICD-10-CM | POA: Diagnosis not present

## 2017-07-12 DIAGNOSIS — Z87891 Personal history of nicotine dependence: Secondary | ICD-10-CM | POA: Insufficient documentation

## 2017-07-12 DIAGNOSIS — Z885 Allergy status to narcotic agent status: Secondary | ICD-10-CM | POA: Insufficient documentation

## 2017-07-12 DIAGNOSIS — I428 Other cardiomyopathies: Secondary | ICD-10-CM | POA: Diagnosis not present

## 2017-07-12 DIAGNOSIS — I5022 Chronic systolic (congestive) heart failure: Secondary | ICD-10-CM | POA: Diagnosis not present

## 2017-07-12 DIAGNOSIS — Z952 Presence of prosthetic heart valve: Secondary | ICD-10-CM | POA: Diagnosis not present

## 2017-07-12 DIAGNOSIS — I420 Dilated cardiomyopathy: Secondary | ICD-10-CM | POA: Insufficient documentation

## 2017-07-12 DIAGNOSIS — Z95810 Presence of automatic (implantable) cardiac defibrillator: Secondary | ICD-10-CM

## 2017-07-12 HISTORY — PX: ICD IMPLANT: EP1208

## 2017-07-12 LAB — PROTIME-INR
INR: 2.44
PROTHROMBIN TIME: 26.3 s — AB (ref 11.4–15.2)

## 2017-07-12 LAB — SURGICAL PCR SCREEN
MRSA, PCR: NEGATIVE
Staphylococcus aureus: NEGATIVE

## 2017-07-12 SURGERY — ICD IMPLANT

## 2017-07-12 MED ORDER — MIDAZOLAM HCL 5 MG/5ML IJ SOLN
INTRAMUSCULAR | Status: DC | PRN
  Administered 2017-07-12 (×4): 1 mg via INTRAVENOUS

## 2017-07-12 MED ORDER — AMIODARONE HCL 200 MG PO TABS
200.0000 mg | ORAL_TABLET | Freq: Every day | ORAL | Status: DC
Start: 2017-07-12 — End: 2017-07-13
  Administered 2017-07-12 – 2017-07-13 (×2): 200 mg via ORAL
  Filled 2017-07-12 (×2): qty 1

## 2017-07-12 MED ORDER — CHLORHEXIDINE GLUCONATE 4 % EX LIQD: 60.0000 mL | Freq: Once | CUTANEOUS | Status: DC

## 2017-07-12 MED ORDER — SODIUM CHLORIDE 0.9 % IV SOLN: INTRAVENOUS | Status: DC

## 2017-07-12 MED ORDER — MIDAZOLAM HCL 5 MG/5ML IJ SOLN
INTRAMUSCULAR | Status: AC
  Filled 2017-07-12: qty 5

## 2017-07-12 MED ORDER — SODIUM CHLORIDE 0.9 % IR SOLN
80.0000 mg | Status: AC
  Administered 2017-07-12: 80 mg

## 2017-07-12 MED ORDER — LIDOCAINE HCL (PF) 1 % IJ SOLN
INTRAMUSCULAR | Status: DC | PRN
  Administered 2017-07-12: 40 mL via SUBCUTANEOUS

## 2017-07-12 MED ORDER — FENTANYL CITRATE (PF) 100 MCG/2ML IJ SOLN
INTRAMUSCULAR | Status: AC
  Filled 2017-07-12: qty 2

## 2017-07-12 MED ORDER — WARFARIN SODIUM 2 MG PO TABS
2.0000 mg | ORAL_TABLET | ORAL | Status: DC
  Administered 2017-07-12: 2 mg via ORAL
  Filled 2017-07-12: qty 1

## 2017-07-12 MED ORDER — CEFAZOLIN SODIUM-DEXTROSE 2-4 GM/100ML-% IV SOLN
2.0000 g | INTRAVENOUS | Status: AC
  Administered 2017-07-12: 2 g via INTRAVENOUS

## 2017-07-12 MED ORDER — LIDOCAINE HCL (PF) 1 % IJ SOLN
INTRAMUSCULAR | Status: AC
  Filled 2017-07-12: qty 120

## 2017-07-12 MED ORDER — SODIUM CHLORIDE 0.9 % IR SOLN
Status: AC
  Filled 2017-07-12: qty 2

## 2017-07-12 MED ORDER — WARFARIN SODIUM 4 MG PO TABS: 4.0000 mg | ORAL_TABLET | ORAL | Status: DC

## 2017-07-12 MED ORDER — ONDANSETRON HCL 4 MG/2ML IJ SOLN: 4.0000 mg | Freq: Four times a day (QID) | INTRAMUSCULAR | Status: DC | PRN

## 2017-07-12 MED ORDER — ACETAMINOPHEN 325 MG PO TABS: 325.0000 mg | ORAL_TABLET | ORAL | Status: DC | PRN

## 2017-07-12 MED ORDER — SODIUM CHLORIDE 0.9 % IV SOLN
INTRAVENOUS | Status: DC
  Administered 2017-07-12: 250 mL via INTRAVENOUS
  Administered 2017-07-12: 10:00:00 via INTRAVENOUS
  Administered 2017-07-12: 250 mL via INTRAVENOUS

## 2017-07-12 MED ORDER — LIDOCAINE HCL (PF) 1 % IJ SOLN
INTRAMUSCULAR | Status: AC
  Filled 2017-07-12: qty 30

## 2017-07-12 MED ORDER — FENTANYL CITRATE (PF) 100 MCG/2ML IJ SOLN
INTRAMUSCULAR | Status: DC | PRN
  Administered 2017-07-12: 25 ug via INTRAVENOUS

## 2017-07-12 MED ORDER — CEFAZOLIN SODIUM-DEXTROSE 2-4 GM/100ML-% IV SOLN
INTRAVENOUS | Status: AC
  Filled 2017-07-12: qty 100

## 2017-07-12 MED ORDER — MUPIROCIN 2 % EX OINT
TOPICAL_OINTMENT | CUTANEOUS | Status: AC
  Filled 2017-07-12: qty 22

## 2017-07-12 MED ORDER — LOSARTAN POTASSIUM 25 MG PO TABS
12.5000 mg | ORAL_TABLET | Freq: Every day | ORAL | Status: DC
  Administered 2017-07-12 – 2017-07-13 (×2): 12.5 mg via ORAL
  Filled 2017-07-12 (×2): qty 1

## 2017-07-12 MED ORDER — MUPIROCIN 2 % EX OINT: 1.0000 "application " | TOPICAL_OINTMENT | Freq: Once | CUTANEOUS | Status: DC

## 2017-07-12 MED ORDER — CEFAZOLIN SODIUM-DEXTROSE 1-4 GM/50ML-% IV SOLN
1.0000 g | Freq: Four times a day (QID) | INTRAVENOUS | Status: AC
  Administered 2017-07-12 – 2017-07-13 (×3): 1 g via INTRAVENOUS
  Filled 2017-07-12 (×4): qty 50

## 2017-07-12 MED ORDER — WARFARIN - PHYSICIAN DOSING INPATIENT: Freq: Every day | Status: DC

## 2017-07-12 MED ORDER — HEPARIN (PORCINE) IN NACL 2-0.9 UNIT/ML-% IJ SOLN
INTRAMUSCULAR | Status: AC
  Filled 2017-07-12: qty 2000

## 2017-07-12 MED ORDER — HEPARIN (PORCINE) IN NACL 2-0.9 UNIT/ML-% IJ SOLN
INTRAMUSCULAR | Status: AC | PRN
  Administered 2017-07-12: 500 mL

## 2017-07-12 SURGICAL SUPPLY — 9 items
CABLE SURGICAL S-101-97-12 (CABLE) ×4 IMPLANT
ICD VIGILANT VR D232 (Pacemaker) ×2 IMPLANT
LEAD RELIANCE G DF4 0293 (Lead) ×2 IMPLANT
PAD DEFIB LIFELINK (PAD) ×2 IMPLANT
SHEATH CLASSIC 7F (SHEATH) IMPLANT
SHEATH CLASSIC 8F (SHEATH) IMPLANT
SHEATH CLASSIC 9.5F (SHEATH) ×2 IMPLANT
SHEATH CLASSIC 9F (SHEATH) IMPLANT
TRAY PACEMAKER INSERTION (PACKS) ×2 IMPLANT

## 2017-07-12 NOTE — H&P (View-Only) (Signed)
HPI [name] returns today for ongoing follow-up of ventricular tachycardia, status post thoracic aortic aneurysm repair. He is a pleasant 71 year old man who underwent ascending aortic replacement several weeks ago, which was complicated by the development of ventricular tachycardia leading to respiratory failure and prolonged ventilator therapy. This was complicated by the development of subcutaneous emphysema. He had a persistent air leak. After a long convalescence, he returns today for additional evaluation. He has been wearing a life vest. No ICD therapies. He has been taking amiodarone. Allergies  Allergen Reactions  . Morphine And Related Other (See Comments)    Hallucinations      Current Outpatient Prescriptions  Medication Sig Dispense Refill  . amiodarone (PACERONE) 200 MG tablet Take 1 tablet (200 mg total) by mouth daily. 30 tablet 0  . losartan (COZAAR) 25 MG tablet Take 0.5 tablets (12.5 mg total) by mouth daily. 30 tablet 0  . warfarin (COUMADIN) 4 MG tablet Take 1 tablet (4 mg total) by mouth daily at 6 PM. 30 tablet 0   No current facility-administered medications for this visit.      Past Medical History:  Diagnosis Date  . AVD (aortic valve disease)   . Chronic anticoagulation   . Dilated cardiomyopathy (HCC)    EF 30-35%  . Dysrhythmia   . Hyperlipidemia   . LV dysfunction     ROS:   All systems reviewed and negative except as noted in the HPI.   Past Surgical History:  Procedure Laterality Date  . AORTIC VALVE REPLACEMENT    . BENTALL PROCEDURE N/A 04/29/2017   Procedure: REPLACEMENT ASCENDING AORTA/ WHEAT PROCEDURE, HYPOTHERMIC CIRCULATORY ARREST AND CARDIOPULMONARY BYPASS, AND USE OF HEMASHIELD PLATINUM WOVEN DOUBLE VELOUR VASCULAR GRAFT 32 MM X 50 CM;  Surgeon: [name], MD;  Location: [facility] OR;  Service: Open Heart Surgery;  Laterality: N/A;  . DOPPLER ECHOCARDIOGRAPHY  04/07/2002   EF 30-35%  . RIGHT/LEFT HEART CATH AND CORONARY  ANGIOGRAPHY N/A 03/05/2017   Procedure: Right/Left Heart Cath and Coronary Angiography;  Surgeon: Peter M [place]aziland, MD;  Location: [facility] INVASIVE CV LAB;  Service: Cardiovascular;  Laterality: N/A;  . TEE WITHOUT CARDIOVERSION N/A 04/29/2017   Procedure: TRANSESOPHAGEAL ECHOCARDIOGRAM (TEE);  Surgeon: [name], MD;  Location: [facility] OR;  Service: Open Heart Surgery;  Laterality: N/A;     Family History  Problem Relation Age of Onset  . Heart disease Brother      Social History   Social History  . Marital status: Married    Spouse name: N/A  . Number of children: N/A  . Years of education: N/A   Occupational History  . Not on file.   Social History Main Topics  . Smoking status: Former Smoker    Quit date: 11/12/2000  . Smokeless tobacco: Never Used  . Alcohol use No  . Drug use: No  . Sexual activity: Yes   Other Topics Concern  . Not on file   Social History Narrative  . No narrative on file     BP 96/62   Pulse 90   Ht 5' 10.5" (1.791 m)   Wt 144 lb (65.3 kg)   SpO2 96%   BMI 20.37 kg/m   Physical Exam:  Well appearing 71 yo man, NAD HEENT: Unremarkable Neck:  6 cm JVD, no thyromegally Lymphatics:  No adenopathy Back:  No CVA tenderness Lungs:  Clear with no wheezes HEART:  Regular rate rhythm, no murmurs, no rubs, no clicks  Abd:  soft, positive bowel sounds, no organomegally, no rebound, no guarding Ext:  2 plus pulses, no edema, no cyanosis, no clubbing Skin:  No rashes no nodules Neuro:  CN II through XII intact, motor grossly intact  EKG - nsr with IVCD   Assess/Plan: 1. VT - he has had no recurrent episodes since his discharge from the hospital. He will continue amio 200 a day. We will schedule ICD insertion 2. Coags - his INR is 5 today. He is instructed to eat spinach and we will plan ICD insertion in 2 days. He will hold his coumadin until his procedure. 3. Chronic systolic heart failure - his EF is 30% he has class 2 symptoms. No indication  for a BiV device with QRS of 138.  4. Aortic aneurysm - he is s/p extraction. No change in meds.  Leonia Reeves.D.

## 2017-07-12 NOTE — Interval H&P Note (Signed)
History and Physical Interval Note:  07/12/2017 8:55 AM  Jack Joseph  has presented today for surgery, with the diagnosis of vt  The various methods of treatment have been discussed with the patient and family. After consideration of risks, benefits and other options for treatment, the patient has consented to  Procedure(s): ICD Implant (N/A) as a surgical intervention .  The patient's history has been reviewed, patient examined, no change in status, stable for surgery.  I have reviewed the patient's chart and labs.  Questions were answered to the patient's satisfaction.     Jack Joseph

## 2017-07-13 ENCOUNTER — Encounter (HOSPITAL_COMMUNITY): Payer: Self-pay | Admitting: *Deleted

## 2017-07-13 ENCOUNTER — Ambulatory Visit (HOSPITAL_COMMUNITY): Payer: Medicare Other

## 2017-07-13 DIAGNOSIS — Z7901 Long term (current) use of anticoagulants: Secondary | ICD-10-CM | POA: Diagnosis not present

## 2017-07-13 DIAGNOSIS — I472 Ventricular tachycardia: Secondary | ICD-10-CM

## 2017-07-13 DIAGNOSIS — Z885 Allergy status to narcotic agent status: Secondary | ICD-10-CM | POA: Diagnosis not present

## 2017-07-13 DIAGNOSIS — I420 Dilated cardiomyopathy: Secondary | ICD-10-CM | POA: Diagnosis not present

## 2017-07-13 LAB — PROTIME-INR
INR: 2.53
PROTHROMBIN TIME: 27 s — AB (ref 11.4–15.2)

## 2017-07-13 NOTE — Progress Notes (Signed)
The patient and his wife have been given discharge instructions and a medication list for what to took today. He has been educated on wound site care and movement restrictions. He is discharging with his wife via car.   Sheppard Evens RN

## 2017-07-13 NOTE — Progress Notes (Signed)
Dr. Kathrine Cords [facility] Cardiology paged patient had 6 beat of Vtach bp 91/57 hr 81 patient was asymptomatic. Ilean Skill LPN

## 2017-07-13 NOTE — Discharge Summary (Signed)
ELECTROPHYSIOLOGY PROCEDURE DISCHARGE SUMMARY    Patient ID: Jack Joseph,  MRN: [id number], DOB/AGE: 05-24-1946 71 y.o.  Admit date: 07/12/2017 Discharge date: 07/13/2017  Electrophysiologist: Dr Ladona Ridgel  Primary Discharge Diagnosis:  Ventricular tachycardia  Secondary Discharge Diagnosis:   chronic systolic dysfunction  Allergies  Allergen Reactions  . Morphine And Related Other (See Comments)    Hallucinations      Procedures This Admission:  1.  Implantation of a [employer] single chamber ICD on 07/12/17 by Dr Ladona Ridgel.   DFT's were deferred at time of implant.  There were no immediate post procedure complications. 2.  CXR on 07/13/17 demonstrated no pneumothorax status post device implantation.   Brief HPI: Jack Joseph is a 71 y.o. male was referred to electrophysiology in the outpatient setting for consideration of ICD implantation.  Past medical history includes ventricular tachycardia and CHF.  The patient has persistent LV dysfunction despite guideline directed therapy.  Risks, benefits, and alternatives to ICD implantation were reviewed with the patient who wished to proceed.   Hospital Course:  The patient was admitted and underwent implantation of a [employer] ICD with details as outlined above. He was monitored on telemetry overnight which demonstrated sinus rhythm with IVCD and frequent PVCs, rare nonsustained VT.  Left chest was without hematoma or ecchymosis.  The device was interrogated and found to be functioning normally.  CXR was obtained and demonstrated no pneumothorax status post device implantation.  Wound care, arm mobility, and restrictions were reviewed with the patient.  The patient was examined and considered stable for discharge to home.   The patient's discharge medications include an ARB.   He is not on a beta blocker due to low blood pressure.  Physical Exam: Vitals:   07/12/17 2347 07/13/17 0433 07/13/17 0656 07/13/17  0740  BP: (!) 92/57 (!) 102/43  (!) 93/55  Pulse: 84 87  86  Resp: (!) 24 16  20   Temp: 98 F (36.7 C) 98.9 F (37.2 C)  99.1 F (37.3 C)  TempSrc: Oral Oral  Oral  SpO2: 93% 94%  93%  Weight:   140 lb 14 oz (63.9 kg)   Height:        GEN- The patient is chronically ill appearing, alert and oriented x 3 today.   Appears older than his stated age. HEENT: normocephalic, atraumatic; sclera clear, conjunctiva pink; hearing intact; oropharynx clear; neck supple, no JVP Lymph- no cervical lymphadenopathy Lungs- Clear to ausculation bilaterally, normal work of breathing.  No wheezes, rales, rhonchi Heart- Regular rate and rhythm  GI- soft, non-tender, non-distended, bowel sounds present, no hepatosplenomegaly Extremities- no clubbing, cyanosis, or edema; DP/PT/radial pulses 2+ bilaterally MS- no significant deformity or atrophy Skin- warm and dry, no rash or lesion, left chest without hematoma/ecchymosis Psych- euthymic mood, full affect Neuro- strength and sensation are intact   Labs:   Lab Results  Component Value Date   WBC 7.4 07/10/2017   HGB 12.0 (L) 07/10/2017   HCT 37.7 07/10/2017   MCV 89 07/10/2017   PLT 324 07/10/2017     Recent Labs Lab 07/10/17 1537  NA 136  K 4.5  CL 95*  CO2 26  BUN 15  CREATININE 1.12  CALCIUM 9.2  GLUCOSE 74    Discharge Medications:  Allergies as of 07/13/2017      Reactions   Morphine And Related Other (See Comments)   Hallucinations      Medication List    TAKE these  medications   amiodarone 200 MG tablet Commonly known as:  PACERONE Take 1 tablet (200 mg total) by mouth daily.   losartan 25 MG tablet Commonly known as:  COZAAR Take 0.5 tablets (12.5 mg total) by mouth daily.   warfarin 4 MG tablet Commonly known as:  COUMADIN Take 1 tablet (4 mg total) by mouth daily at 6 PM. What changed:  how much to take  when to take this  additional instructions       Disposition:   Follow-up Information    [facility]  [employer] [facility] Follow up on 07/24/2017.   Specialty:  Cardiology Why:  4:00PM, wound check visit Contact information: [address] [place] [address] [phone]       Marinus Maw, MD Follow up on 10/15/2017.   Specialty:  Cardiology Why:  2:15PM Contact information: [address] [place] [address] [phone]           Randolm Idol MD 07/13/2017 10:05 AM

## 2017-07-13 NOTE — Care Management Note (Signed)
Case Management Note  Patient Details  Name: Jack Joseph MRN: [id number] Date of Birth: 01-29-1946  Subjective/Objective:                 Patient with order to DC to home today. Chart reviewed. No Home Health or Equipment needs, no unacknowledged Case Management consults or medication needs identified at the time of this note. Plan for DC to home. If needs arise today prior to discharge, please call Lawerance Sabal RN CM at [phone].    Action/Plan:   Expected Discharge Date:  07/13/17               Expected Discharge Plan:  Home/Self Care  In-House Referral:     Discharge planning Services  CM Consult  Post Acute Care Choice:    Choice offered to:     DME Arranged:    DME Agency:     HH Arranged:    HH Agency:     Status of Service:  Completed, signed off  If discussed at [employer] of Stay Meetings, dates discussed:    Additional Comments:  Lawerance Sabal, RN 07/13/2017, 10:27 AM

## 2017-07-13 NOTE — Discharge Instructions (Signed)
° ° °  Supplemental Discharge Instructions for   Defibrillator Patients  Activity No heavy lifting or vigorous activity with your left/right arm for 6 to 8 weeks.  Do not raise your left/right arm above your head for one week.  Gradually raise your affected arm as drawn below.             07/16/17                       07/17/17                      07/18/17                    07/19/17 __  NO DRIVING until cleared to by Dr. Ladona Ridgel   WOUND CARE - Keep the wound area clean and dry.  Do not get this area wet for one week. No showers for one week; you may shower on  07/19/17  . - The tape/steri-strips on your wound will fall off; do not pull them off.  No bandage is needed on the site.  DO  NOT apply any creams, oils, or ointments to the wound area. - If you notice any drainage or discharge from the wound, any swelling or bruising at the site, or you develop a fever > 101? F after you are discharged home, call the office at once.  Special Instructions - You are still able to use cellular telephones; use the ear opposite the side where you have your pacemaker/defibrillator.  Avoid carrying your cellular phone near your device. - When traveling through airports, show security personnel your identification card to avoid being screened in the metal detectors.  Ask the security personnel to use the hand wand. - Avoid arc welding equipment, MRI testing (magnetic resonance imaging), TENS units (transcutaneous nerve stimulators).  Call the office for questions about other devices. - Avoid electrical appliances that are in poor condition or are not properly grounded. - Microwave ovens are safe to be near or to operate.  Additional information for defibrillator patients should your device go off: - If your device goes off ONCE and you feel fine afterward, notify the device clinic nurses. - If your device goes off ONCE and you do not feel well afterward, call 911. - If your device goes off TWICE, call 911. - If  your device goes off THREE times in one day, call 911.  DO NOT DRIVE YOURSELF OR A FAMILY MEMBER WITH A DEFIBRILLATOR TO THE HOSPITAL--CALL 911.'

## 2017-07-16 ENCOUNTER — Encounter (HOSPITAL_COMMUNITY): Payer: Self-pay | Admitting: Internal Medicine

## 2017-07-16 MED FILL — Gentamicin Sulfate Inj 40 MG/ML: INTRAMUSCULAR | Qty: 2 | Status: AC

## 2017-07-16 MED FILL — Heparin Sodium (Porcine) 2 Unit/ML in Sodium Chloride 0.9%: INTRAMUSCULAR | Qty: 1000 | Status: AC

## 2017-07-16 MED FILL — Sodium Chloride Irrigation Soln 0.9%: Qty: 500 | Status: AC

## 2017-07-22 ENCOUNTER — Ambulatory Visit (INDEPENDENT_AMBULATORY_CARE_PROVIDER_SITE_OTHER): Payer: Medicare Other | Admitting: *Deleted

## 2017-07-22 DIAGNOSIS — Z5181 Encounter for therapeutic drug level monitoring: Secondary | ICD-10-CM

## 2017-07-22 DIAGNOSIS — Z952 Presence of prosthetic heart valve: Secondary | ICD-10-CM

## 2017-07-22 DIAGNOSIS — Z95828 Presence of other vascular implants and grafts: Secondary | ICD-10-CM | POA: Diagnosis not present

## 2017-07-22 DIAGNOSIS — Z7901 Long term (current) use of anticoagulants: Secondary | ICD-10-CM

## 2017-07-22 LAB — POCT INR: INR: 2.6

## 2017-07-24 ENCOUNTER — Ambulatory Visit (INDEPENDENT_AMBULATORY_CARE_PROVIDER_SITE_OTHER): Payer: Medicare Other | Admitting: *Deleted

## 2017-07-24 DIAGNOSIS — I472 Ventricular tachycardia, unspecified: Secondary | ICD-10-CM

## 2017-07-24 DIAGNOSIS — Z8674 Personal history of sudden cardiac arrest: Secondary | ICD-10-CM

## 2017-07-25 ENCOUNTER — Telehealth: Payer: Self-pay | Admitting: Cardiology

## 2017-07-25 LAB — CUP PACEART INCLINIC DEVICE CHECK
HIGH POWER IMPEDANCE MEASURED VALUE: 56 Ohm
Implantable Lead Location: 753860
Implantable Lead Model: 293
Implantable Lead Serial Number: 435986
Lead Channel Pacing Threshold Amplitude: 0.7 V
Lead Channel Setting Pacing Amplitude: 3.5 V
Lead Channel Setting Sensing Sensitivity: 0.5 mV
MDC IDC LEAD IMPLANT DT: 20180831
MDC IDC MSMT LEADCHNL RV IMPEDANCE VALUE: 504 Ohm
MDC IDC MSMT LEADCHNL RV PACING THRESHOLD PULSEWIDTH: 0.4 ms
MDC IDC MSMT LEADCHNL RV SENSING INTR AMPL: 11.6 mV
MDC IDC PG IMPLANT DT: 20180831
MDC IDC PG SERIAL: 240202
MDC IDC SESS DTM: 20180912040000
MDC IDC SET LEADCHNL RV PACING PULSEWIDTH: 0.4 ms

## 2017-07-25 NOTE — Telephone Encounter (Signed)
He can take meloxican for no more than 3 consecutive days. Prefer tylenol for pain management but okay to use meloxicam if anti-inflammatory needed

## 2017-07-25 NOTE — Progress Notes (Signed)
Wound check appointment. Steri-strips removed. Healing stages of bruising noted. Moderate size soft hematoma noted, pt aware to call if hematoma does not decrease in size or increases in size Incision edges approximated, wound well healed. Normal device function. Threshold, sensing, and impedances consistent with implant measurements. Device programmed at 3.5V for extra safety margin until 3 month visit. Histogram distribution appropriate for patient and level of activity. No  ventricular arrhythmias noted. Patient educated about wound care, arm mobility, lifting restrictions, shock plan. ROV 10/15/2017 with GT

## 2017-07-25 NOTE — Telephone Encounter (Signed)
New message      Pt is calling to ask if he can take meloxicam.  He says he has a lot of joint pain.  Pt takes warfarin along with other cardiac drugs.  Please call

## 2017-07-29 ENCOUNTER — Telehealth (HOSPITAL_COMMUNITY): Payer: Self-pay

## 2017-07-29 NOTE — Telephone Encounter (Signed)
I called patient to discuss scheduling for cardiac rehab. Patient declined cardiac rehab, he stated he is not interested. Referral closed.

## 2017-07-31 ENCOUNTER — Other Ambulatory Visit: Payer: Self-pay | Admitting: Physical Medicine & Rehabilitation

## 2017-07-31 ENCOUNTER — Other Ambulatory Visit: Payer: Self-pay | Admitting: Internal Medicine

## 2017-08-01 ENCOUNTER — Ambulatory Visit (INDEPENDENT_AMBULATORY_CARE_PROVIDER_SITE_OTHER): Payer: Medicare Other | Admitting: *Deleted

## 2017-08-01 DIAGNOSIS — Z5181 Encounter for therapeutic drug level monitoring: Secondary | ICD-10-CM

## 2017-08-01 DIAGNOSIS — I359 Nonrheumatic aortic valve disorder, unspecified: Secondary | ICD-10-CM | POA: Diagnosis not present

## 2017-08-01 DIAGNOSIS — Z7901 Long term (current) use of anticoagulants: Secondary | ICD-10-CM

## 2017-08-01 DIAGNOSIS — Z952 Presence of prosthetic heart valve: Secondary | ICD-10-CM | POA: Diagnosis not present

## 2017-08-01 DIAGNOSIS — Z95828 Presence of other vascular implants and grafts: Secondary | ICD-10-CM

## 2017-08-01 LAB — POCT INR: INR: 2.3

## 2017-08-07 ENCOUNTER — Telehealth: Payer: Self-pay

## 2017-08-07 MED ORDER — LOSARTAN POTASSIUM 25 MG PO TABS: 12.5000 mg | ORAL_TABLET | Freq: Every day | ORAL | 3 refills | Status: DC

## 2017-08-07 NOTE — Telephone Encounter (Signed)
Spoke to patient's wife received a message from Dr.Jordan he advised patient can decrease amiodarone to 200 mg daily except none on Sundays.Advised to continue losartan,refill sent to pharmacy.

## 2017-08-15 ENCOUNTER — Ambulatory Visit (INDEPENDENT_AMBULATORY_CARE_PROVIDER_SITE_OTHER): Payer: Medicare Other | Admitting: *Deleted

## 2017-08-15 ENCOUNTER — Ambulatory Visit: Payer: Medicare Other | Admitting: Physical Medicine & Rehabilitation

## 2017-08-15 DIAGNOSIS — I712 Thoracic aortic aneurysm, without rupture, unspecified: Secondary | ICD-10-CM

## 2017-08-15 DIAGNOSIS — Z5181 Encounter for therapeutic drug level monitoring: Secondary | ICD-10-CM

## 2017-08-15 DIAGNOSIS — Z95828 Presence of other vascular implants and grafts: Secondary | ICD-10-CM

## 2017-08-15 DIAGNOSIS — Z952 Presence of prosthetic heart valve: Secondary | ICD-10-CM | POA: Diagnosis not present

## 2017-08-15 DIAGNOSIS — Z7901 Long term (current) use of anticoagulants: Secondary | ICD-10-CM

## 2017-08-15 LAB — POCT INR: INR: 5.4

## 2017-08-15 LAB — PROTIME-INR
INR: 4.3 — ABNORMAL HIGH (ref 0.8–1.2)
Prothrombin Time: 45.6 s — ABNORMAL HIGH (ref 9.1–12.0)

## 2017-08-19 ENCOUNTER — Encounter: Payer: Self-pay | Admitting: Cardiology

## 2017-08-26 NOTE — Progress Notes (Signed)
[name] Date of Birth: 1946-03-22   History of Present Illness: Jack Joseph is seen today for post hospital follow up.  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 He is on chronic Coumadin therapy. He has a history of dilated cardiomyopathy with prior ejection fraction of 30-35%.   When seen earlier this year CXR showed a left hilar rounded mass and CT was recommended. 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 An Echo was also obtained showing normal functioning of his prosthetic AV. It confirmed the ascending aortic aneurysm. LV function was severely reduced with EF 25-30%. There was mild pulmonary HTN. Cardiac cath showed mild nonobstructive CAD with normal right heart pressures and cardiac output. The prosthetic AV disc moved well on fluoro.   On 04/29/17 he underwent a Wheat procedure with replacement of his ascending aorta by Dr. Tyrone Sage. The prior AV prosthesis was left in place. His post op course was complicated. He was initially extubated on the evening of surgery and inotropes were weaned. On the next day he developed pulseless VT with cardiac arrest. He required cardioversion, CPR, and reintubation. He was loaded on IV amiodarone. He developed a pneumothorax and had a chest tube placed. On POD #3 he was once again extubated. He developed subcutaneous emphysema on the right and had a second chest tube placed. He required reintubation again due to mucus plugging. On POD#6 he was extubated again. On POD #8 his subcutaneous emphysema worsened and his right chest tube had to be replaced. He required transfusion on 2 occasions for anemia. On POD #8 he had increased atelectasis. He had bronchoscopy. Breathing again worsened and he was reintubated. He required TPN for nutritional support and IV pressors for BP support. He improved and was able to be  extubated on POD #13 and chest tubes removed on POD #16. Diet was advanced and he was transitioned to coumadin. He also developed diarrhea and was C. Diff positive. This was treated. On July 12 he was transferred to inpatient Rehab for PT/OT. He continued to have SOB and hypoxia treated with lasix and Xopenex. This improved and he was weaned off oxygen. He was DC home on July 20 on coumadin, amiodarone, and losartan. Lifevest was ordered. Echo on July 9 showed EF 20-25%. Dr. Ladona Ridgel recommended consideration for ICD implant and this was done on 07/12/17. Amiodarone dose decreased to 200 mg daily except none on Sunday.   On follow up today he is seen with his wife. He is doing well. His  Fatigue and SOB have improved significantly.  He does note some neck pain since ICD placed. No ICD discharges.  Still has some swelling in feet. Wears compression hose. He is anxious to return to work.   Current Outpatient Prescriptions on File Prior to Visit  Medication Sig Dispense Refill  . amiodarone (PACERONE) 200 MG tablet Take 200 mg daily except none on Sundays 90 tablet 3  . losartan (COZAAR) 25 MG tablet Take 0.5 tablets (12.5 mg total) by mouth daily. 45 tablet 3  . warfarin (COUMADIN) 5 MG tablet Take as directed by coumadin clinic     No current facility-administered medications on file prior to visit.     Allergies  Allergen Reactions  . Morphine And Related Other (See Comments)    Hallucinations     Past Medical History:  Diagnosis Date  .  AVD (aortic valve disease)   . Chronic anticoagulation   . Dilated cardiomyopathy (HCC)    EF 30-35%  . Dysrhythmia   . Hyperlipidemia   . LV dysfunction     Past Surgical History:  Procedure Laterality Date  . AORTIC VALVE REPLACEMENT    . BENTALL PROCEDURE N/A 04/29/2017   Procedure: REPLACEMENT ASCENDING AORTA/ WHEAT PROCEDURE, HYPOTHERMIC CIRCULATORY ARREST AND CARDIOPULMONARY BYPASS, AND USE OF HEMASHIELD PLATINUM WOVEN DOUBLE VELOUR VASCULAR  GRAFT 32 MM X 50 CM;  Surgeon: [name], MD;  Location: [facility] OR;  Service: Open Heart Surgery;  Laterality: N/A;  . DOPPLER ECHOCARDIOGRAPHY  04/07/2002   EF 30-35%  . ICD IMPLANT N/A 07/12/2017   Procedure: ICD Implant;  Surgeon: Marinus Maw, MD;  Location: [facility] INVASIVE CV LAB;  Service: Cardiovascular;  Laterality: N/A;  . RIGHT/LEFT HEART CATH AND CORONARY ANGIOGRAPHY N/A 03/05/2017   Procedure: Right/Left Heart Cath and Coronary Angiography;  Surgeon: Syncere Kaminski M [place], MD;  Location: [facility] INVASIVE CV LAB;  Service: Cardiovascular;  Laterality: N/A;  . TEE WITHOUT CARDIOVERSION N/A 04/29/2017   Procedure: TRANSESOPHAGEAL ECHOCARDIOGRAM (TEE);  Surgeon: [name], MD;  Location: [facility] OR;  Service: Open Heart Surgery;  Laterality: N/A;    History  Smoking Status  . Former Smoker  . Quit date: 11/12/2000  Smokeless Tobacco  . Never Used    History  Alcohol Use No    Family History  Problem Relation Age of Onset  . Heart disease Brother     Review of Systems: As noted in history of present illness.  All other systems were reviewed and are negative.  Physical Exam: BP (!) 98/54   Pulse (!) 52   Ht  (1.778 m)   Wt 145 lb (65.8 kg)   BMI 20.81 kg/m  GENERAL:  Well appearing, thin WM in NAD. Wearing a Lifevest.  HEENT:  PERRL, EOMI, sclera are clear. Oropharynx is clear. NECK:  No jugular venous distention, carotid upstroke brisk and symmetric, no bruits, no thyromegaly or adenopathy LUNGS:  Clear to auscultation bilaterally CHEST:  Unremarkable, surgical incisions have healed well.  HEART:  RRR with frequent extrasystoles,  PMI not displaced or sustained,S1  within normal limits, good mechanical AV click, no S3, no S4: no clicks, no rubs, no murmurs ABD:  Soft, nontender. BS +, no masses. + mid line bruit. No hepatomegaly, no splenomegaly EXT:  2 + pulses throughout, 1+ edema, no cyanosis no clubbing SKIN:  Warm and dry.  No rashes NEURO:  Alert and oriented x  3. Cranial nerves II through XII intact. PSYCH:  Cognitively intact    LABORATORY DATA:  Lab Results  Component Value Date   WBC 7.4 07/10/2017   HGB 12.0 (L) 07/10/2017   HCT 37.7 07/10/2017   PLT 324 07/10/2017   GLUCOSE 74 07/10/2017   CHOL 199 01/22/2017   TRIG 71 01/22/2017   HDL 64 01/22/2017   LDLCALC 121 (H) 01/22/2017   ALT 23 05/24/2017   AST 20 05/24/2017   NA 136 07/10/2017   K 4.5 07/10/2017   CL 95 (L) 07/10/2017   CREATININE 1.12 07/10/2017   BUN 15 07/10/2017   CO2 26 07/10/2017   TSH 4.065 05/09/2017   INR 4.3 (H) 08/15/2017   HGBA1C 5.7 (H) 04/25/2017     Echo 05/20/17:Study Conclusions  - Procedure narrative: Transthoracic echocardiography. Image   quality was poor. The study was technically difficult, as a   result of poor sound wave  transmission. - Left ventricle: The cavity size was mildly dilated. Wall   thickness was normal. Systolic function was severely reduced. The   estimated ejection fraction was in the range of 20% to 25%.   Diffuse hypokinesis. Features are consistent with a pseudonormal   left ventricular filling pattern, with concomitant abnormal   relaxation and increased filling pressure (grade 2 diastolic   dysfunction). - Aortic valve: A mechanical prosthesis was present. - Mitral valve: There was mild regurgitation. - Pericardium, extracardiac: There was a left pleural effusion.  Impressions:  - Severe global reduction in LV systolic function; moderate   diastolic dysfunction; mild LVE; s/p AVR with normal mean   gradient; mildly dilated aortic root; mild MR.   Assessment / Plan: 1. Remote Status post aortic valve replacement with a Bjork Shiley mechanical prosthesis. He is on chronic Coumadin therapy.  Good click on exam. Good valve function by Echo. Coumadin being adjusted by pharmacy. Will check INR today.  2. Dilated cardiomyopathy. Ejection fraction of 20-25% post Aortic aneurysm repair.  Patient is class 1-2. Only  on losartan  daily. I don't think we can titrate meds now due to soft BP.   3. Thoracic aortic aneurysm. Ascending aorta. 6.3 cm.  Now s/p Wheat procedure with replacement. 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 Post op course very complicated due to development of VT, acute respiratory failure, subQ emphysema, multiple chest tubes, anemia, and deconditioning. He clinically is doing much better now.   4. VT following Aortic aneurysm surgery. Low EF. S/p ICD implant. Will gradually reduce amiodarone if no arrhythmia. Now on 6 days/week.   5. Abdominal bruit. On exam aorta does not feel aneurysmal. Will eventually need imaging of abdominal aorta to rule out AAA.   Will release patient to return to work. He still may not drive until Xmas. I will follow up in 3 months.

## 2017-08-28 ENCOUNTER — Ambulatory Visit (INDEPENDENT_AMBULATORY_CARE_PROVIDER_SITE_OTHER): Payer: Medicare Other | Admitting: Pharmacist Clinician (PhC)/ Clinical Pharmacy Specialist

## 2017-08-28 ENCOUNTER — Encounter: Payer: Self-pay | Admitting: Cardiology

## 2017-08-28 ENCOUNTER — Ambulatory Visit (INDEPENDENT_AMBULATORY_CARE_PROVIDER_SITE_OTHER): Payer: Medicare Other | Admitting: Cardiology

## 2017-08-28 VITALS — BP 98/54 | HR 52 | Ht 70.0 in | Wt 145.0 lb

## 2017-08-28 DIAGNOSIS — I712 Thoracic aortic aneurysm, without rupture, unspecified: Secondary | ICD-10-CM

## 2017-08-28 DIAGNOSIS — Z95828 Presence of other vascular implants and grafts: Secondary | ICD-10-CM

## 2017-08-28 DIAGNOSIS — Z7901 Long term (current) use of anticoagulants: Secondary | ICD-10-CM

## 2017-08-28 DIAGNOSIS — I472 Ventricular tachycardia, unspecified: Secondary | ICD-10-CM

## 2017-08-28 DIAGNOSIS — Z952 Presence of prosthetic heart valve: Secondary | ICD-10-CM

## 2017-08-28 DIAGNOSIS — Z5181 Encounter for therapeutic drug level monitoring: Secondary | ICD-10-CM

## 2017-08-28 DIAGNOSIS — I5022 Chronic systolic (congestive) heart failure: Secondary | ICD-10-CM | POA: Diagnosis not present

## 2017-08-28 LAB — POCT INR: INR: 3.4

## 2017-08-28 NOTE — Patient Instructions (Addendum)
Continue your current therapy   I will see you in 3 months. 

## 2017-09-11 ENCOUNTER — Ambulatory Visit (INDEPENDENT_AMBULATORY_CARE_PROVIDER_SITE_OTHER): Payer: Medicare Other | Admitting: *Deleted

## 2017-09-11 DIAGNOSIS — Z952 Presence of prosthetic heart valve: Secondary | ICD-10-CM | POA: Diagnosis not present

## 2017-09-11 DIAGNOSIS — Z7901 Long term (current) use of anticoagulants: Secondary | ICD-10-CM | POA: Diagnosis not present

## 2017-09-11 DIAGNOSIS — Z5181 Encounter for therapeutic drug level monitoring: Secondary | ICD-10-CM | POA: Diagnosis not present

## 2017-09-11 DIAGNOSIS — Z95828 Presence of other vascular implants and grafts: Secondary | ICD-10-CM | POA: Diagnosis not present

## 2017-09-11 LAB — POCT INR: INR: 3.5

## 2017-09-15 NOTE — Progress Notes (Signed)
HPI:  [name] is a 71 y.o.male here today for his routine physical examination.  I saw him last on June 12, 2017 after hospital discharge.  Last CPE: He is not sure if he has had one before. He lives with his wife.  Regular exercise 3 or more times per week: No Following a healthy diet: Yes.   Chronic medical problems: HLD, HTN, insomnia, hearing loss,ED, S/p aortic valve replacement, chronic anticoagulation, s/p Wheat procedure in 04/2017 complicated with cardiac arrest. Sleeping better, 6-7 hours, and he is not longer on Trazodone.   Not longer having dyspnea. He is now doing some chores around his house and his daughter's. He is not driving.  + Fatigue, improving slowly.  CHF with ICD placement in 06/2017. He follows with cardiologist,Jack Joseph.    Lab Results  Component Value Date   CHOL 199 01/22/2017   HDL 64 01/22/2017   LDLCALC 121 (H) 01/22/2017   TRIG 71 01/22/2017   CHOLHDL 3.1 01/22/2017    Lab Results  Component Value Date   TSH 4.065 05/09/2017    -Last eye exam 5 years ago. -Hep C screening: Never and would like to have it. -Last colon cancer screening: "A few years ago", did not follow as recommended and not interested.  -Last prostate ca screening: According to pt,he has not had it before and  agrees to have it today. Nocturia 1-3 times, stable. Denies dysuria,increased urinary frequency, gross hematuria,or decreased urine output.  -Denies high alcohol intake or Hx of illicit drug use.  -Former smoker.   Lab Results  Component Value Date   WBC 7.4 07/10/2017   HGB 12.0 (L) 07/10/2017   HCT 37.7 07/10/2017   MCV 89 07/10/2017   PLT 324 07/10/2017   Lab Results  Component Value Date   HGBA1C 5.7 (H) 04/25/2017     Review of Systems  Constitutional: Positive for fatigue. Negative for activity change, appetite change, fever and unexpected weight change.  HENT: Negative for dental problem, mouth sores, nosebleeds,  sore throat, trouble swallowing and voice change.   Eyes: Negative for redness and visual disturbance.  Respiratory: Negative for cough, shortness of breath and wheezing.   Cardiovascular: Positive for leg swelling. Negative for chest pain and palpitations.  Gastrointestinal: Negative for abdominal pain, blood in stool, nausea and vomiting.       No changes in bowel habits.  Endocrine: Negative for cold intolerance, heat intolerance, polydipsia, polyphagia and polyuria.  Genitourinary: Negative for decreased urine volume, dysuria, genital sores, hematuria and testicular pain.  Musculoskeletal: Negative for gait problem and myalgias.  Skin: Negative for pallor and rash.  Neurological: Negative for dizziness, syncope, weakness and headaches.  Psychiatric/Behavioral: Negative for confusion and sleep disturbance. The patient is not nervous/anxious.   All other systems reviewed and are negative.    Current Outpatient Medications on File Prior to Visit  Medication Sig Dispense Refill  . amiodarone (PACERONE) 200 MG tablet Take 200 mg daily except none on Sundays 90 tablet 3  . losartan (COZAAR) 25 MG tablet Take 0.5 tablets (12.5 mg total) by mouth daily. 45 tablet 3  . warfarin (COUMADIN) 5 MG tablet Take as directed by coumadin clinic     No current facility-administered medications on file prior to visit.      Past Medical History:  Diagnosis Date  . AVD (aortic valve disease)   . Chronic anticoagulation   . Dilated cardiomyopathy (HCC)    EF 30-35%  .  Dysrhythmia   . Hyperlipidemia   . LV dysfunction     Past Surgical History:  Procedure Laterality Date  . AORTIC VALVE REPLACEMENT    . DOPPLER ECHOCARDIOGRAPHY  04/07/2002   EF 30-35%    Allergies  Allergen Reactions  . Morphine And Related Other (See Comments)    Hallucinations     Family History  Problem Relation Age of Onset  . Heart disease Brother     Social History   Socioeconomic History  . Marital  status: Married    Spouse name: None  . Number of children: None  . Years of education: None  . Highest education level: None  Social Needs  . Financial resource strain: None  . Food insecurity - worry: None  . Food insecurity - inability: None  . Transportation needs - medical: None  . Transportation needs - non-medical: None  Occupational History  . None  Tobacco Use  . Smoking status: Former Smoker    Last attempt to quit: 11/12/2000    Years since quitting: 16.8  . Smokeless tobacco: Never Used  Substance and Sexual Activity  . Alcohol use: No  . Drug use: No  . Sexual activity: Yes  Other Topics Concern  . None  Social History Narrative  . None     Vitals:   09/16/17 0759  BP: 102/60  Pulse: 63  Resp: 12  Temp: 98.2 F (36.8 C)  SpO2: 95%   Body mass index is 20.72 kg/m.   Wt Readings from Last 3 Encounters:  09/16/17 144 lb 6 oz (65.5 kg)  08/28/17 145 lb (65.8 kg)  07/13/17 140 lb 14 oz (63.9 kg)    Physical Exam  Nursing note and vitals reviewed. Constitutional: He is oriented to person, place, and time. He appears well-developed and well-nourished. No distress.  HENT:  Head: Normocephalic and atraumatic.  Right Ear: Tympanic membrane, external ear and ear canal normal. Decreased hearing is noted.  Left Ear: External ear normal. Decreased hearing is noted.  Mouth/Throat: Oropharynx is clear and moist and mucous membranes are normal. He has dentures.  Left ear canal excess cerumen, TM seen partially.   Eyes: Conjunctivae and EOM are normal. Pupils are equal, round, and reactive to light.  Neck: Normal range of motion. No JVD present. No tracheal deviation present. No thyromegaly present.  Cardiovascular: Normal rate. An irregular rhythm present.  No murmur heard. Pulses:      Dorsalis pedis pulses are 2+ on the right side, and 2+ on the left side.  Valve click present. LE varicose veins, bilateral.  Respiratory: Effort normal and breath sounds  normal. No respiratory distress.  GI: Soft. He exhibits no mass. There is no hepatomegaly. There is no tenderness.  Genitourinary:  Genitourinary Comments: Refused,no concerns.  Musculoskeletal: He exhibits edema (1+ pitting LE edema,bilateral.). He exhibits no tenderness.  No signs of synovitis.  Lymphadenopathy:    He has no cervical adenopathy.       Right: No supraclavicular adenopathy present.       Left: No supraclavicular adenopathy present.  Neurological: He is alert and oriented to person, place, and time. He has normal strength. No cranial nerve deficit or sensory deficit. Coordination normal.  Reflex Scores:      Bicep reflexes are 2+ on the right side and 2+ on the left side.      Patellar reflexes are 2+ on the right side and 2+ on the left side. Stable gait with no assistance.  Skin: Skin  is warm. No rash noted. No erythema.  Psychiatric: He has a normal mood and affect. Cognition and memory are normal.  Well-groomed, good eye contact.     ASSESSMENT AND PLAN:   [name] was seen today for follow-up and annual exam.  Diagnoses and all orders for this visit:  Lab Results  Component Value Date   CHOL 171 09/16/2017   HDL 55.00 09/16/2017   LDLCALC 103 (H) 09/16/2017   TRIG 63.0 09/16/2017   CHOLHDL 3 09/16/2017   Lab Results  Component Value Date   CREATININE 1.32 09/16/2017   BUN 16 09/16/2017   NA 138 09/16/2017   K 4.2 09/16/2017   CL 101 09/16/2017   CO2 29 09/16/2017   Lab Results  Component Value Date   PSA 0.83 09/16/2017    Encounter for general adult medical examination with abnormal findings  We discussed the importance of regular physical activity and healthy diet for prevention of chronic illness and/or complications. Preventive guidelines reviewed. Vaccination updated. He is not interested in colonoscopy or colon cancer screening. Fall precautions discussed.  Next CPE in a year.  The 10-year ASCVD risk score Denman George(Goff [place] Montez HagemanJr., et al.,  2013) is: 14.8%   Values used to calculate the score:     Age: [age] years     Sex: Male     Is Non-Hispanic African American: No     Diabetic: No     Tobacco smoker: No     Systolic Blood Pressure: 102 mmHg     Is BP treated: Yes     HDL Cholesterol: 55 mg/dL     Total Cholesterol: 171 mg/dL  Cardiac arrhythmia, unspecified cardiac arrhythmia type  Asymptomatic. Hx of CHF, due to low BP he has not tolerated BB.  ICD placed. Continue following with Jack Peter Swazil[place]Jordan. Instructed about warning signs.  Encounter for HCV screening test for high risk patient -     Hepatitis C antibody screen  Diabetes mellitus screening -     Basic metabolic panel  BPH associated with nocturia  Stable symptoms.  -     PSA(Must document that pt has been informed of limitations of PSA testing.)  Hyperlipidemia, unspecified hyperlipidemia type  Continue nonpharmacologic treatment. Further recommendations will be given according to lab results.  -     Lipid panel  Need for influenza vaccination -     Flu vaccine HIGH DOSE PF  Need for vaccination against Streptococcus pneumoniae using pneumococcal conjugate vaccine 13 -     Pneumococcal conjugate vaccine 13-valent IM    Return in 1 year (on 09/16/2018).    Maricruz Lucero G. SwazilandJordan, MD  [facility]. [place] office.

## 2017-09-16 ENCOUNTER — Encounter: Payer: Self-pay | Admitting: Family Medicine

## 2017-09-16 ENCOUNTER — Ambulatory Visit (INDEPENDENT_AMBULATORY_CARE_PROVIDER_SITE_OTHER): Payer: Medicare Other | Admitting: Family Medicine

## 2017-09-16 VITALS — BP 102/60 | HR 63 | Temp 98.2°F | Resp 12 | Ht 70.0 in | Wt 144.4 lb

## 2017-09-16 DIAGNOSIS — I499 Cardiac arrhythmia, unspecified: Secondary | ICD-10-CM | POA: Diagnosis not present

## 2017-09-16 DIAGNOSIS — E785 Hyperlipidemia, unspecified: Secondary | ICD-10-CM

## 2017-09-16 DIAGNOSIS — Z1159 Encounter for screening for other viral diseases: Secondary | ICD-10-CM

## 2017-09-16 DIAGNOSIS — N401 Enlarged prostate with lower urinary tract symptoms: Secondary | ICD-10-CM | POA: Diagnosis not present

## 2017-09-16 DIAGNOSIS — Z131 Encounter for screening for diabetes mellitus: Secondary | ICD-10-CM | POA: Diagnosis not present

## 2017-09-16 DIAGNOSIS — Z9189 Other specified personal risk factors, not elsewhere classified: Secondary | ICD-10-CM

## 2017-09-16 DIAGNOSIS — Z0001 Encounter for general adult medical examination with abnormal findings: Secondary | ICD-10-CM

## 2017-09-16 DIAGNOSIS — R351 Nocturia: Secondary | ICD-10-CM | POA: Diagnosis not present

## 2017-09-16 DIAGNOSIS — Z23 Encounter for immunization: Secondary | ICD-10-CM | POA: Diagnosis not present

## 2017-09-16 LAB — LIPID PANEL
CHOLESTEROL: 171 mg/dL (ref 0–200)
HDL: 55 mg/dL (ref >39.00)
LDL Cholesterol: 103 mg/dL — ABNORMAL HIGH (ref 0–99)
NonHDL: 115.5
TRIGLYCERIDES: 63 mg/dL (ref 0.0–149.0)
Total CHOL/HDL Ratio: 3
VLDL: 12.6 mg/dL (ref 0.0–40.0)

## 2017-09-16 LAB — BASIC METABOLIC PANEL
BUN: 16 mg/dL (ref 6–23)
CO2: 29 mEq/L (ref 19–32)
CREATININE: 1.32 mg/dL (ref 0.40–1.50)
Calcium: 9.3 mg/dL (ref 8.4–10.5)
Chloride: 101 mEq/L (ref 96–112)
GFR: 56.6 mL/min — AB (ref >60.00)
Glucose, Bld: 86 mg/dL (ref 70–99)
POTASSIUM: 4.2 meq/L (ref 3.5–5.1)
Sodium: 138 mEq/L (ref 135–145)

## 2017-09-16 LAB — PSA: PSA: 0.83 ng/mL (ref 0.10–4.00)

## 2017-09-16 NOTE — Patient Instructions (Addendum)
A few things to remember from today's visit:   Encounter for general adult medical examination with abnormal findings  Encounter for HCV screening test for high risk patient - Plan: Hepatitis C antibody screen  Encounter for hepatitis C virus screening test for high risk patient  Screening for lipid disorders - Plan: Lipid panel  Diabetes mellitus screening - Plan: Basic metabolic panel  Prostate cancer screening  BPH associated with nocturia - Plan: PSA(Must document that pt has been informed of limitations of PSA testing.)  Need for immunization against influenza  Need for vaccination with 13-polyvalent pneumococcal conjugate vaccine   At least 150 minutes of moderate exercise per week, daily brisk walking for 15-30 min is a good exercise option. Healthy diet low in saturated (animal) fats and sweets and consisting of fresh fruits and vegetables, lean meats such as fish and white chicken and whole grains.  Decrease caffeine intake.   - Vaccines:    Shingles vaccine recommended at age 62, could be given after 72 years of age but not sure about insurance coverage. If you decide to get it, go to your pharmacy to get the new vaccine.   Pneumonia vaccines:  Prevnar 13 at 65 and Pneumovax at 56.   -Screening recommendations for low/normal risk males:  Screening for diabetes at age 8 and every 3 years. Earlier screening if cardiovascular risk factors.    Colon cancer screening at age 70 and until age 29. Consider having it done.  Prostate cancer screening: some controversy, starts usually at 50: Rectal exam and PSA.   Also recommended:  1. Dental visit- Brush and floss your teeth twice daily; visit your dentist twice a year. 2. Eye doctor- Get an eye exam at least every 2 years. 3. Helmet use- Always wear a helmet when riding a bicycle, motorcycle, rollerblading or skateboarding. 4. Safe sex- If you may be exposed to sexually transmitted infections, use a  condom. 5. Seat belts- Seat belts can save your live; always wear one. 6. Smoke/Carbon Monoxide detectors- These detectors need to be installed on the appropriate level of your home. Replace batteries at least once a year. 7. Skin cancer- When out in the sun please cover up and use sunscreen 15 SPF or higher. 8. Violence- If anyone is threatening or hurting you, please tell your healthcare provider.  9. Drink alcohol in moderation- Limit alcohol intake to one drink or less per day. Never drink and drive.   I will see you back in a year, before if needed.  Continue following with cardiologist.   Please be sure medication list is accurate. If a new problem present, please set up appointment sooner than planned today.

## 2017-09-17 LAB — HEPATITIS C ANTIBODY
HEP C AB: NONREACTIVE
SIGNAL TO CUT-OFF: 0.13 (ref <1.00)

## 2017-09-26 ENCOUNTER — Ambulatory Visit (INDEPENDENT_AMBULATORY_CARE_PROVIDER_SITE_OTHER): Payer: Medicare Other | Admitting: Cardiothoracic Surgery

## 2017-09-26 ENCOUNTER — Other Ambulatory Visit: Payer: Self-pay

## 2017-09-26 ENCOUNTER — Other Ambulatory Visit: Payer: Self-pay | Admitting: *Deleted

## 2017-09-26 ENCOUNTER — Encounter: Payer: Self-pay | Admitting: Cardiothoracic Surgery

## 2017-09-26 VITALS — BP 104/60 | HR 52 | Resp 16 | Ht 70.0 in | Wt 146.4 lb

## 2017-09-26 DIAGNOSIS — Z09 Encounter for follow-up examination after completed treatment for conditions other than malignant neoplasm: Secondary | ICD-10-CM | POA: Diagnosis not present

## 2017-09-26 DIAGNOSIS — I712 Thoracic aortic aneurysm, without rupture, unspecified: Secondary | ICD-10-CM

## 2017-09-26 DIAGNOSIS — Z95810 Presence of automatic (implantable) cardiac defibrillator: Secondary | ICD-10-CM | POA: Diagnosis not present

## 2017-09-26 NOTE — Progress Notes (Unsigned)
CT

## 2017-09-26 NOTE — Progress Notes (Signed)
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 POSTOPERATIVE DIAGNOSIS:  Status post mechanical aortic valve replacement in 1983, now with 6.3 to 6.5 cm ascending aortic dilatation with left ventricular dysfunction. SURGICAL PROCEDURES:  Replacement of ascending aorta/Wheat procedure with hypothermic circulatory arrest and cardiopulmonary bypass with a 32 mm Dacron Hemashield woven graft.  History of Present Illness:     Patient returns to the office today in follow-up after replacement of his ascending aorta June 2018. The patient's initial surgical intervention went well, he  on the second postop night after surgery he had a pulseless VT arrest, CPR was done and is course became complicated by right pneumothorax and respiratory failure. Ultimately he improved and has since been discharged from inpatient rehabilitation and home physical therapy.   He is now returning to near-normal activities at home , defibrillator has been placed.  He remains active in his yard, has not returned to work yet because he is not been able to drive because of his AICD device and clearance from the EP service to drive.     Past Medical History:  Diagnosis Date  . AVD (aortic valve disease)   . Chronic anticoagulation   . Dilated cardiomyopathy (HCC)    EF 30-35%  . Dysrhythmia   . Hyperlipidemia   . LV dysfunction      Social History   Tobacco Use  Smoking Status Former Smoker  . Last attempt to quit: 11/12/2000  . Years since quitting: 16.8  Smokeless Tobacco Never  Used    Social History   Substance and Sexual Activity  Alcohol Use No     Allergies  Allergen Reactions  . Morphine And Related Other (See Comments)    Hallucinations     Current Outpatient Medications  Medication Sig Dispense Refill  . amiodarone (PACERONE) 200 MG tablet Take 200 mg daily except none on Sundays 90 tablet 3  . losartan (COZAAR) 25 MG tablet Take 0.5 tablets (12.5 mg total) by mouth daily. 45 tablet 3  . warfarin (COUMADIN) 5 MG tablet Take as directed by coumadin clinic     No current facility-administered medications for this visit.        Physical Exam: BP 104/60 (BP Location: Left Arm, Patient Position: Sitting, Cuff Size: Normal)   Pulse (!) 52   Resp 16   Ht 5\' 10"  (1.778 Joseph)   Wt 146 lb 6.4 oz (66.4 kg)   SpO2 94% Comment: ON RA  BMI 21.01 kg/Joseph   General appearance: alert and cooperative Neurologic: intact Heart: regularly irregular rhythm Lungs: clear to auscultation bilaterally Abdomen: soft, non-tender; bowel sounds normal; no masses,  no organomegaly Extremities: extremities normal, atraumatic, no cyanosis or edema, edema It has cleared and Homans sign is negative, no sign of DVT Wound: Sternum is stable and well healed   Diagnostic Studies & Laboratory data:     Recent Radiology Findings:   No results found.    Recent Lab Findings: Lab Results  Component  Value Date   WBC 7.4 07/10/2017   HGB 12.0 (L) 07/10/2017   HCT 37.7 07/10/2017   PLT 324 07/10/2017   GLUCOSE 86 09/16/2017   CHOL 171 09/16/2017   TRIG 63.0 09/16/2017   HDL 55.00 09/16/2017   LDLCALC 103 (H) 09/16/2017   ALT 23 05/24/2017   AST 20 05/24/2017   NA 138 09/16/2017   K 4.2 09/16/2017   CL 101 09/16/2017   CREATININE 1.32 09/16/2017   BUN 16 09/16/2017   CO2 29 09/16/2017   TSH 4.065 05/09/2017   INR 3.5 09/11/2017   HGBA1C 5.7 (H) 04/25/2017      Assessment / Plan:      Patient stable after prolonged postoperative course after redo ascending  aortic replacement, complicated by VT arrest  Patient progressing well after complicated redo surgery.  In December we will obtain a CTA of the chest and follow-up after his aortic replacement and I will see him at that time       Jack Ovens MD      [address] [place] [place] [address] Office [phone]   Beeper [phone]  09/26/2017 11:06 AM

## 2017-10-10 ENCOUNTER — Ambulatory Visit (INDEPENDENT_AMBULATORY_CARE_PROVIDER_SITE_OTHER): Payer: Medicare Other | Admitting: *Deleted

## 2017-10-10 DIAGNOSIS — Z5181 Encounter for therapeutic drug level monitoring: Secondary | ICD-10-CM

## 2017-10-10 DIAGNOSIS — Z7901 Long term (current) use of anticoagulants: Secondary | ICD-10-CM | POA: Diagnosis not present

## 2017-10-10 DIAGNOSIS — Z95828 Presence of other vascular implants and grafts: Secondary | ICD-10-CM | POA: Diagnosis not present

## 2017-10-10 DIAGNOSIS — Z952 Presence of prosthetic heart valve: Secondary | ICD-10-CM

## 2017-10-10 LAB — POCT INR: INR: 3

## 2017-10-10 NOTE — Patient Instructions (Signed)
Continue taking 1 tablet each Thursday, 1/2 tablet all other days.  Repeat INR in 5 weeks with MD appt. Coumadin Clinic#[phone]#[phone]

## 2017-10-15 ENCOUNTER — Encounter: Payer: Self-pay | Admitting: Internal Medicine

## 2017-10-15 ENCOUNTER — Ambulatory Visit (INDEPENDENT_AMBULATORY_CARE_PROVIDER_SITE_OTHER): Payer: Medicare Other | Admitting: Internal Medicine

## 2017-10-15 VITALS — BP 112/60 | HR 93 | Ht 70.0 in | Wt 147.0 lb

## 2017-10-15 DIAGNOSIS — I472 Ventricular tachycardia, unspecified: Secondary | ICD-10-CM

## 2017-10-15 DIAGNOSIS — I5022 Chronic systolic (congestive) heart failure: Secondary | ICD-10-CM

## 2017-10-15 DIAGNOSIS — Z95810 Presence of automatic (implantable) cardiac defibrillator: Secondary | ICD-10-CM | POA: Diagnosis not present

## 2017-10-15 NOTE — Patient Instructions (Addendum)
Medication Instructions:  Your physician recommends that you continue on your current medications as directed. Please refer to the Current Medication list given to you today.  Labwork: None ordered.  Testing/Procedures: None ordered.  Follow-Up: Your physician wants you to follow-up in: 9 months with Dr. Ladona Ridgel.   You will receive a reminder letter in the mail two months in advance. If you don't receive a letter, please call our office to schedule the follow-up appointment.  Remote monitoring is used to monitor your ICD from home. This monitoring reduces the number of office visits required to check your device to one time per year. It allows [place] to keep an eye on the functioning of your device to ensure it is working properly. You are scheduled for a device check from home on 01/14/2018. You may send your transmission at any time that day. If you have a wireless device, the transmission will be sent automatically. After your physician reviews your transmission, you will receive a postcard with your next transmission date.  Any Other Special Instructions Will Be Listed Below (If Applicable).  If you need a refill on your cardiac medications before your next appointment, please call your pharmacy.

## 2017-10-15 NOTE — Progress Notes (Signed)
HPI Mr. Reine returns today for ongoing evaluation and follow-up of his ICD.  He is a very pleasant 71 year old man with a history of chronic systolic heart failure, aortic root disease status post replacement, ventricular tachycardia, and frequent PVCs.  The patient was hospitalized for many weeks after aortic root replacement, and had an extensive hospital course complicated by ventricular tachycardia and subcutaneous emphysema, respiratory failure, and underwent ICD insertion approximately 3 months ago.  In the interim he has done well.  He has some dyspnea with exertion but denies chest pain and has had no ICD shock.  No syncope. Allergies  Allergen Reactions  . Morphine And Related Other (See Comments)    Hallucinations      Current Outpatient Medications  Medication Sig Dispense Refill  . amiodarone (PACERONE) 200 MG tablet Take 200 mg daily except none on Sundays 90 tablet 3  . losartan (COZAAR) 25 MG tablet Take 0.5 tablets (12.5 mg total) by mouth daily. 45 tablet 3  . warfarin (COUMADIN) 5 MG tablet Take as directed by coumadin clinic     No current facility-administered medications for this visit.      Past Medical History:  Diagnosis Date  . AVD (aortic valve disease)   . Chronic anticoagulation   . Dilated cardiomyopathy (HCC)    EF 30-35%  . Dysrhythmia   . Hyperlipidemia   . LV dysfunction     ROS:   All systems reviewed and negative except as noted in the HPI.   Past Surgical History:  Procedure Laterality Date  . AORTIC VALVE REPLACEMENT    . BENTALL PROCEDURE N/A 04/29/2017   Procedure: REPLACEMENT ASCENDING AORTA/ WHEAT PROCEDURE, HYPOTHERMIC CIRCULATORY ARREST AND CARDIOPULMONARY BYPASS, AND USE OF HEMASHIELD PLATINUM WOVEN DOUBLE VELOUR VASCULAR GRAFT 32 MM X 50 CM;  Surgeon: [name], MD;  Location: [facility] OR;  Service: Open Heart Surgery;  Laterality: N/A;  . DOPPLER ECHOCARDIOGRAPHY  04/07/2002   EF 30-35%  . ICD IMPLANT N/A 07/12/2017    Procedure: ICD Implant;  Surgeon: Marinus Maw, MD;  Location: [facility] INVASIVE CV LAB;  Service: Cardiovascular;  Laterality: N/A;  . RIGHT/LEFT HEART CATH AND CORONARY ANGIOGRAPHY N/A 03/05/2017   Procedure: Right/Left Heart Cath and Coronary Angiography;  Surgeon: Peter M [place], MD;  Location: [facility] INVASIVE CV LAB;  Service: Cardiovascular;  Laterality: N/A;  . TEE WITHOUT CARDIOVERSION N/A 04/29/2017   Procedure: TRANSESOPHAGEAL ECHOCARDIOGRAM (TEE);  Surgeon: [name], MD;  Location: [facility] OR;  Service: Open Heart Surgery;  Laterality: N/A;     Family History  Problem Relation Age of Onset  . Heart disease Brother      Social History   Socioeconomic History  . Marital status: Married    Spouse name: Not on file  . Number of children: Not on file  . Years of education: Not on file  . Highest education level: Not on file  Social Needs  . Financial resource strain: Not on file  . Food insecurity - worry: Not on file  . Food insecurity - inability: Not on file  . Transportation needs - medical: Not on file  . Transportation needs - non-medical: Not on file  Occupational History  . Not on file  Tobacco Use  . Smoking status: Former Smoker    Last attempt to quit: 11/12/2000    Years since quitting: 16.9  . Smokeless tobacco: Never Used  Substance and Sexual Activity  . Alcohol use: No  . Drug  use: No  . Sexual activity: Yes  Other Topics Concern  . Not on file  Social History Narrative  . Not on file     BP 112/60   Pulse 93   Ht 5\' 10"  (1.778 m)   Wt 147 lb (66.7 kg)   SpO2 97%   BMI 21.09 kg/m   Physical Exam:  stable appearing thin, 71 yo man, NAD HEENT: Unremarkable Neck:  No JVD, no thyromegally Lymphatics:  No adenopathy Back:  No CVA tenderness Lungs:  Clear HEART:  Regular rate rhythm, no murmurs, no rubs, no clicks Abd:  soft, positive bowel sounds, no organomegally, no rebound, no guarding Ext:  2 plus pulses, no edema, no cyanosis, no  clubbing Skin:  No rashes no nodules Neuro:  CN II through XII intact, motor grossly intact  EKG -normal sinus rhythm with PVCs in a bigeminal fashion.  The QRS morphology is positive in the precordial leads, and negative in the inferior leads.  AVL is positive and aVR is negative.  The QRS is fairly narrow indicative of an origin near the conduction system and the septum.  DEVICE  Normal device function.  See PaceArt for details.   Assess/Plan: 1.  Ventricular tachycardia -he has had no sustained ventricular tachycardia since his defibrillator was placed.  He is tolerating amiodarone very nicely.  His ventricular ectopy is asymptomatic. 2.  Chronic systolic heart failure -his symptoms are class II.  He will continue his current medical therapy.  He is encouraged to maintain a low-sodium diet. 3.  ICD -  his [employer] single-chamber device is working normally. We will recheck in several months. 4.  Hypertension - his blood pressure is well controlled.  he will continue his current medication    Sharrell KuGreg Taylor, MD

## 2017-10-28 ENCOUNTER — Other Ambulatory Visit: Payer: Medicare Other

## 2017-10-31 ENCOUNTER — Encounter: Payer: Medicare Other | Admitting: Cardiothoracic Surgery

## 2017-10-31 ENCOUNTER — Ambulatory Visit
Admission: RE | Admit: 2017-10-31 | Discharge: 2017-10-31 | Disposition: A | Discharge status: Home / Self Care | Payer: Medicare Other | Source: Ambulatory Visit | Attending: Cardiothoracic Surgery | Admitting: Cardiothoracic Surgery

## 2017-10-31 DIAGNOSIS — I712 Thoracic aortic aneurysm, without rupture, unspecified: Secondary | ICD-10-CM

## 2017-10-31 MED ORDER — IOPAMIDOL (ISOVUE-370) INJECTION 76%
75.0000 mL | Freq: Once | INTRAVENOUS | Status: AC | PRN
  Administered 2017-10-31: 75 mL via INTRAVENOUS

## 2017-11-04 ENCOUNTER — Ambulatory Visit (INDEPENDENT_AMBULATORY_CARE_PROVIDER_SITE_OTHER): Payer: Medicare Other | Admitting: Cardiothoracic Surgery

## 2017-11-04 VITALS — BP 106/51 | HR 79 | Resp 20 | Ht 70.0 in | Wt 147.0 lb

## 2017-11-04 DIAGNOSIS — I712 Thoracic aortic aneurysm, without rupture, unspecified: Secondary | ICD-10-CM

## 2017-11-04 DIAGNOSIS — Z09 Encounter for follow-up examination after completed treatment for conditions other than malignant neoplasm: Secondary | ICD-10-CM

## 2017-11-04 DIAGNOSIS — R918 Other nonspecific abnormal finding of lung field: Secondary | ICD-10-CM

## 2017-11-04 NOTE — Progress Notes (Signed)
[address]       [place] [address]             [phone]      [facility] Medical Record #[id number] Date of Birth: 26-Aug-1946  Referring: [place], Betty G, MD Primary Care: [place], Betty G, MD  Chief Complaint:   POST OP FOLLOW UP 04/29/2017   OPERATIVE REPORT PREOPERATIVE DIAGNOSIS:  Status post mechanical aortic valve replacement in 1983, now with 6.3 to 6.5 cm ascending aortic dilatation with left ventricular dysfunction. POSTOPERATIVE DIAGNOSIS:  Status post mechanical aortic valve replacement in 1983, now with 6.3 to 6.5 cm ascending aortic dilatation with left ventricular dysfunction. SURGICAL PROCEDURES:  Replacement of ascending aorta/Wheat procedure with hypothermic circulatory arrest and cardiopulmonary bypass with a 32 mm Dacron Hemashield woven graft.  History of Present Illness:      Patient is without new complains comes in to review follow up ct a of chest after aortic surgery and evaluation of left lung mass . 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 AVD (aortic valve disease)   . Chronic anticoagulation   . Dilated cardiomyopathy (HCC)    EF 30-35%  . Dysrhythmia   . Hyperlipidemia   . LV dysfunction      Social History   Tobacco Use  Smoking Status Former Smoker  . Last attempt to quit: 11/12/2000  . Years since quitting: 16.9  Smokeless Tobacco Never Used    Social History   Substance and Sexual Activity  Alcohol Use No     Allergies  Allergen Reactions  . Morphine And Related Other (See Comments)    Hallucinations     Current Outpatient Medications  Medication Sig Dispense Refill  . amiodarone (PACERONE) 200 MG tablet Take 200 mg daily except none on Sundays 90 tablet 3  . losartan (COZAAR) 25 MG tablet Take 0.5 tablets (12.5 mg total) by mouth daily. 45 tablet 3  . warfarin (COUMADIN) 5 MG  tablet Take as directed by coumadin clinic     No current facility-administered medications for this visit.        Physical Exam: BP (!) 106/51   Pulse 79   Resp 20   Ht 5\' 10"  (1.778 m)   Wt 147 lb (66.7 kg)   SpO2 95% Comment: RA  BMI 21.09 kg/m  General appearance: alert, cooperative, appears stated age and no distress Head: Normocephalic, without obvious abnormality, atraumatic Neck: no adenopathy, no carotid bruit, no JVD, supple, symmetrical, trachea midline and thyroid not enlarged, symmetric, no tenderness/mass/nodules Lymph nodes: Cervical, supraclavicular, and axillary nodes normal. Resp: clear to auscultation bilaterally Back: symmetric, no curvature. ROM normal. No CVA tenderness. 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 EXAM: CT ANGIOGRAPHY CHEST WITH CONTRAST TECHNIQUE: Multidetector CT imaging of the chest was performed using the standard protocol during bolus administration of intravenous contrast. Multiplanar CT image reconstructions and MIPs were obtained to  evaluate the vascular anatomy. CONTRAST:  75mL ISOVUE-370 IOPAMIDOL (ISOVUE-370) INJECTION 76% COMPARISON:  02/01/2017, post- operative CT of the chest without contrast on 05/07/2017 and chest x-ray on 07/13/2017 FINDINGS: Cardiovascular: Initial unenhanced CT demonstrates high attenuation supracoronary synthetic aortic graft extending from the aortic root to the proximal arch. Arterial phase post-contrast imaging shows normal patency of the graft with normal  appearance of proximal and distal anastomoses and no evidence of pseudoaneurysm or residual aneurysmal disease. The aortic root measures approximately 3.6 cm. The graft measures approximately 3.4 cm. Native arch measures approximately 3.2 cm. The descending thoracic aorta measures approximately 2.4 cm. No evidence of aortic dissection. Proximal great vessels show stable mild atherosclerosis without stenosis. Stable normal variant separate origin of the left vertebral artery off of the aortic arch. Stable CT appearance of mechanical aortic valve. Stable heart size without evidence of pericardial fluid. Calcified plaque again noted in the coronary arteries. Single lead ICD extends to the right ventricular apex. Normal caliber central pulmonary arteries. Mediastinum/Nodes: No mediastinal, hilar or axillary lymphadenopathy. No pneumomediastinum or mediastinal fluid collections. Central airways are normally patent. Lungs/Pleura: Lung showed no evidence of pneumothorax, edema or airspace consolidation. Minimal scarring/atelectasis present at the lung bases bilaterally. No pleural effusions. Stable fat containing central lung mass in the left superior hilar region measuring 3.2 cm in greatest diameter and consistent with hamartoma. Upper Abdomen: Stable upper pole left renal cyst has a benign appearance. 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 No abnormalities identified by CT at the level of median sternotomy. Review of the MIP images confirms the above findings. IMPRESSION: 1. 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 The graft appears normally patent with no evidence of residual aneurysmal disease. Previously placed mechanical aortic valve again noted. No evidence of aortic dissection, graft pseudoaneurysm or abnormal mediastinal fluid or air. 2. 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 Electronically Signed   By: Irish LackGlenn  Yamagata M.D.   On: 11/01/2017 09:34     Recent Lab Findings: Lab Results  Component Value Date   WBC 7.4 07/10/2017   HGB 12.0 (L) 07/10/2017   HCT 37.7 07/10/2017   PLT 324 07/10/2017   GLUCOSE 86 09/16/2017   CHOL 171 09/16/2017   TRIG 63.0 09/16/2017   HDL 55.00 09/16/2017   LDLCALC 103 (H) 09/16/2017   ALT 23 05/24/2017   AST 20 05/24/2017   NA 138 09/16/2017   K 4.2 09/16/2017   CL 101 09/16/2017   CREATININE 1.32 09/16/2017   BUN 16 09/16/2017   CO2 29 09/16/2017   TSH 4.065 05/09/2017   INR 3.0 10/10/2017   HGBA1C 5.7 (H) 04/25/2017      Assessment / Plan:   1/s/p replacement of ascending aorta for 6.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

## 2017-11-05 ENCOUNTER — Encounter: Payer: Self-pay | Admitting: Cardiothoracic Surgery

## 2017-11-06 LAB — CUP PACEART INCLINIC DEVICE CHECK
HighPow Impedance: 61 Ohm
Implantable Lead Implant Date: 20180831
Implantable Pulse Generator Implant Date: 20180831
Lead Channel Pacing Threshold Pulse Width: 0.4 ms
Lead Channel Sensing Intrinsic Amplitude: 12.7 mV
MDC IDC LEAD LOCATION: 753860
MDC IDC LEAD SERIAL: 435986
MDC IDC MSMT LEADCHNL RV IMPEDANCE VALUE: 507 Ohm
MDC IDC MSMT LEADCHNL RV PACING THRESHOLD AMPLITUDE: 0.7 V
MDC IDC PG SERIAL: 240202
MDC IDC SESS DTM: 20181204050000
MDC IDC SET LEADCHNL RV PACING AMPLITUDE: 2.5 V
MDC IDC SET LEADCHNL RV PACING PULSEWIDTH: 0.4 ms
MDC IDC SET LEADCHNL RV SENSING SENSITIVITY: 0.5 mV

## 2017-11-08 ENCOUNTER — Encounter: Payer: Medicare Other | Admitting: Cardiothoracic Surgery

## 2017-11-16 NOTE — Progress Notes (Signed)
Jack Joseph Date of Birth: 21-Oct-1946   History of Present Illness: Jack Joseph is seen today for post hospital follow up.  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 He is on chronic Coumadin therapy. He has a history of dilated cardiomyopathy with prior ejection fraction of 30-35%.   When seen earlier this year CXR showed a left hilar rounded mass and CT was recommended. 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 An Echo was also obtained showing normal functioning of his prosthetic AV. It confirmed the ascending aortic aneurysm. LV function was severely reduced with EF 25-30%. There was mild pulmonary HTN. Cardiac cath showed mild nonobstructive CAD with normal right heart pressures and cardiac output. The prosthetic AV disc moved well on fluoro.   On 04/29/17 he underwent a Wheat procedure with replacement of his ascending aorta by Dr. Tyrone Sage. The prior AV prosthesis was left in place. His post op course was complicated. He was initially extubated on the evening of surgery and inotropes were weaned. On the next day he developed pulseless VT with cardiac arrest. He required cardioversion, CPR, and reintubation. He was loaded on IV amiodarone. He developed a pneumothorax and had a chest tube placed. On POD #3 he was once again extubated. He developed subcutaneous emphysema on the right and had a second chest tube placed. He required reintubation again due to mucus plugging. On POD#6 he was extubated again. On POD #8 his subcutaneous emphysema worsened and his right chest tube had to be replaced. He required transfusion on 2 occasions for anemia. On POD #8 he had increased atelectasis. He had bronchoscopy. Breathing again worsened and he was reintubated. He required TPN for nutritional support and IV pressors for BP support. He improved and was able to be  extubated on POD #13 and chest tubes removed on POD #16. Diet was advanced and he was transitioned to coumadin. He also developed diarrhea and was C. Diff positive. This was treated. On July 12 he was transferred to inpatient Rehab for PT/OT. He continued to have SOB and hypoxia treated with lasix and Xopenex. This improved and he was weaned off oxygen. He was DC home on July 20 on coumadin, amiodarone, and losartan. Lifevest was ordered. Echo on July 9 showed EF 20-25%. Dr. Ladona Ridgel recommended consideration for ICD implant and this was done on 07/12/17. Amiodarone dose decreased to 200 mg daily except none on Sunday. Seen in follow up with EP in December and recommended continued current therapy.   On follow up today he is seen with his wife. He is doing well. He has returned to work and is driving. He denies any chest pain or fatigue. Mild dyspnea.    No ICD discharges.  Still has some swelling in feet. Not wearing  compression hose regularly.    Current Outpatient Medications on File Prior to Visit  Medication Sig Dispense Refill  . amiodarone (PACERONE) 200 MG tablet Take 200 mg daily except none on Sundays 90 tablet 3  . losartan (COZAAR) 25 MG tablet Take 0.5 tablets (12.5 mg total) by mouth daily. 45 tablet 3  . warfarin (COUMADIN) 5 MG tablet Take as directed by coumadin clinic     No current facility-administered medications on file prior to visit.     Allergies  Allergen Reactions  . Morphine And Related Other (See Comments)  Hallucinations     Past Medical History:  Diagnosis Date  . AVD (aortic valve disease)   . Chronic anticoagulation   . Dilated cardiomyopathy (HCC)    EF 30-35%  . Dysrhythmia   . Hyperlipidemia   . LV dysfunction     Past Surgical History:  Procedure Laterality Date  . AORTIC VALVE REPLACEMENT    . BENTALL PROCEDURE N/A 04/29/2017   Procedure: REPLACEMENT ASCENDING AORTA/ WHEAT PROCEDURE, HYPOTHERMIC CIRCULATORY ARREST AND CARDIOPULMONARY BYPASS,  AND USE OF HEMASHIELD PLATINUM WOVEN DOUBLE VELOUR VASCULAR GRAFT 32 MM X 50 CM;  Surgeon: [name], MD;  Location: [facility] OR;  Service: Open Heart Surgery;  Laterality: N/A;  . DOPPLER ECHOCARDIOGRAPHY  04/07/2002   EF 30-35%  . ICD IMPLANT N/A 07/12/2017   Procedure: ICD Implant;  Surgeon: Marinus Maw, MD;  Location: [facility] INVASIVE CV LAB;  Service: Cardiovascular;  Laterality: N/A;  . RIGHT/LEFT HEART CATH AND CORONARY ANGIOGRAPHY N/A 03/05/2017   Procedure: Right/Left Heart Cath and Coronary Angiography;  Surgeon: Eria Lozoya M [place], MD;  Location: [facility] INVASIVE CV LAB;  Service: Cardiovascular;  Laterality: N/A;  . TEE WITHOUT CARDIOVERSION N/A 04/29/2017   Procedure: TRANSESOPHAGEAL ECHOCARDIOGRAM (TEE);  Surgeon: [name], MD;  Location: [facility] OR;  Service: Open Heart Surgery;  Laterality: N/A;    Social History   Tobacco Use  Smoking Status Former Smoker  . Last attempt to quit: 11/12/2000  . Years since quitting: 17.0  Smokeless Tobacco Never Used    Social History   Substance and Sexual Activity  Alcohol Use No    Family History  Problem Relation Age of Onset  . Heart disease Brother     Review of Systems: As noted in history of present illness.  All other systems were reviewed and are negative.  Physical Exam: BP 111/76   Pulse 88   Ht 5\' 10"  (1.778 m)   Wt 150 lb (68 kg)   SpO2 95%   BMI 21.52 kg/m  GENERAL:  Well appearing, thin WM in NAD HEENT:  PERRL, EOMI, sclera are clear. Oropharynx is clear. NECK:  No jugular venous distention, carotid upstroke brisk and symmetric, no bruits, no thyromegaly or adenopathy LUNGS:  Clear to auscultation bilaterally CHEST:  Unremarkable HEART:  RRR,  PMI not displaced or sustained,good mechanical AV click. no S3, no S4: no clicks, no rubs, no murmurs ABD:  Soft, nontender. BS +, no masses or bruits. No hepatomegaly, no splenomegaly EXT:  2 + pulses throughout, 1-2+ ankle edema, no cyanosis no clubbing SKIN:  Warm and  dry.  No rashes NEURO:  Alert and oriented x 3. Cranial nerves II through XII intact. PSYCH:  Cognitively intact     LABORATORY DATA:  Lab Results  Component Value Date   WBC 7.4 07/10/2017   HGB 12.0 (L) 07/10/2017   HCT 37.7 07/10/2017   PLT 324 07/10/2017   GLUCOSE 86 09/16/2017   CHOL 171 09/16/2017   TRIG 63.0 09/16/2017   HDL 55.00 09/16/2017   LDLCALC 103 (H) 09/16/2017   ALT 23 05/24/2017   AST 20 05/24/2017   NA 138 09/16/2017   K 4.2 09/16/2017   CL 101 09/16/2017   CREATININE 1.32 09/16/2017   BUN 16 09/16/2017   CO2 29 09/16/2017   TSH 4.065 05/09/2017   PSA 0.83 09/16/2017   INR 3.0 10/10/2017   HGBA1C 5.7 (H) 04/25/2017     Echo 05/20/17:Study Conclusions  - Procedure narrative: Transthoracic echocardiography. Image   quality  was poor. The study was technically difficult, as a   result of poor sound wave transmission. - Left ventricle: The cavity size was mildly dilated. Wall   thickness was normal. Systolic function was severely reduced. The   estimated ejection fraction was in the range of 20% to 25%.   Diffuse hypokinesis. Features are consistent with a pseudonormal   left ventricular filling pattern, with concomitant abnormal   relaxation and increased filling pressure (grade 2 diastolic   dysfunction). - Aortic valve: A mechanical prosthesis was present. - Mitral valve: There was mild regurgitation. - Pericardium, extracardiac: There was a left pleural effusion.  Impressions:  - Severe global reduction in LV systolic function; moderate   diastolic dysfunction; mild LVE; s/p AVR with normal mean   gradient; mildly dilated aortic root; mild MR.   Assessment / Plan: 1. Remote Status post aortic valve replacement with a Bjork Shiley mechanical prosthesis. He is on chronic Coumadin therapy.  Good click on exam. Good valve function by Echo. Coumadin being adjusted by pharmacy. SBE prophylaxis.   2. Dilated cardiomyopathy. Ejection fraction of  20-25% post Aortic aneurysm repair.  Patient is class 1-2. Only on losartan  Daily. BP has improved. Will try starting aldactone 12.5 mg daily. Check BMET in 3-4 days.  3. Thoracic aortic aneurysm. Ascending aorta. 6.3 cm.  Now s/p Wheat procedure with replacement. 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 Post op course very complicated due to development of VT, acute respiratory failure, subQ emphysema, multiple chest tubes, anemia, and deconditioning. He clinically is doing much better now.   4. VT following Aortic aneurysm surgery. Low EF. S/p ICD implant. Continue on 6 days/week.   5. Abdominal bruit. On exam aorta does not feel aneurysmal. Will schedule abdominal US to make sure he doesn't have a AAA.  Follow up in 4 months.

## 2017-11-18 ENCOUNTER — Ambulatory Visit (INDEPENDENT_AMBULATORY_CARE_PROVIDER_SITE_OTHER): Payer: Medicare Other | Admitting: Cardiology

## 2017-11-18 ENCOUNTER — Encounter: Payer: Self-pay | Admitting: Cardiology

## 2017-11-18 ENCOUNTER — Ambulatory Visit (INDEPENDENT_AMBULATORY_CARE_PROVIDER_SITE_OTHER): Payer: Medicare Other | Admitting: Pharmacist

## 2017-11-18 VITALS — BP 111/76 | HR 88 | Ht 70.0 in | Wt 150.0 lb

## 2017-11-18 DIAGNOSIS — I472 Ventricular tachycardia, unspecified: Secondary | ICD-10-CM

## 2017-11-18 DIAGNOSIS — Z5181 Encounter for therapeutic drug level monitoring: Secondary | ICD-10-CM

## 2017-11-18 DIAGNOSIS — I5022 Chronic systolic (congestive) heart failure: Secondary | ICD-10-CM

## 2017-11-18 DIAGNOSIS — I712 Thoracic aortic aneurysm, without rupture, unspecified: Secondary | ICD-10-CM

## 2017-11-18 DIAGNOSIS — Z952 Presence of prosthetic heart valve: Secondary | ICD-10-CM | POA: Diagnosis not present

## 2017-11-18 DIAGNOSIS — Z95810 Presence of automatic (implantable) cardiac defibrillator: Secondary | ICD-10-CM

## 2017-11-18 DIAGNOSIS — Z95828 Presence of other vascular implants and grafts: Secondary | ICD-10-CM

## 2017-11-18 DIAGNOSIS — Z7901 Long term (current) use of anticoagulants: Secondary | ICD-10-CM

## 2017-11-18 LAB — POCT INR: INR: 3.9

## 2017-11-18 MED ORDER — SPIRONOLACTONE 25 MG PO TABS: 12.5000 mg | ORAL_TABLET | Freq: Every day | ORAL | 3 refills | Status: DC

## 2017-11-18 NOTE — Patient Instructions (Addendum)
Start taking spironolactone 12.5 mg daily  Continue your other therapy  We will check your kidney function in 3-4 days.  We will schedule you for an Ultrasound of the abdominal aorta.  I will see you in 4 months

## 2017-11-21 LAB — BASIC METABOLIC PANEL
BUN / CREAT RATIO: 10 (ref 10–24)
BUN: 14 mg/dL (ref 8–27)
CHLORIDE: 96 mmol/L (ref 96–106)
CO2: 27 mmol/L (ref 20–29)
Calcium: 9.2 mg/dL (ref 8.6–10.2)
Creatinine, Ser: 1.41 mg/dL — ABNORMAL HIGH (ref 0.76–1.27)
GFR calc non Af Amer: 49 mL/min/{1.73_m2} — ABNORMAL LOW (ref >59)
GFR, EST AFRICAN AMERICAN: 57 mL/min/{1.73_m2} — AB (ref >59)
GLUCOSE: 123 mg/dL — AB (ref 65–99)
POTASSIUM: 4.5 mmol/L (ref 3.5–5.2)
SODIUM: 136 mmol/L (ref 134–144)

## 2017-11-25 ENCOUNTER — Other Ambulatory Visit: Payer: Self-pay

## 2017-11-25 DIAGNOSIS — I5023 Acute on chronic systolic (congestive) heart failure: Secondary | ICD-10-CM

## 2017-11-25 DIAGNOSIS — I10 Essential (primary) hypertension: Secondary | ICD-10-CM

## 2017-11-25 NOTE — Progress Notes (Signed)
bmet  

## 2017-12-02 ENCOUNTER — Ambulatory Visit (HOSPITAL_COMMUNITY): Admission: RE | Admit: 2017-12-02 | Payer: Medicare Other | Source: Ambulatory Visit

## 2017-12-09 ENCOUNTER — Telehealth: Payer: Self-pay | Admitting: *Deleted

## 2017-12-09 ENCOUNTER — Other Ambulatory Visit: Payer: Self-pay

## 2017-12-09 DIAGNOSIS — I5023 Acute on chronic systolic (congestive) heart failure: Secondary | ICD-10-CM

## 2017-12-09 DIAGNOSIS — I10 Essential (primary) hypertension: Secondary | ICD-10-CM

## 2017-12-09 LAB — BASIC METABOLIC PANEL
BUN / CREAT RATIO: 10 (ref 10–24)
BUN: 15 mg/dL (ref 8–27)
CO2: 25 mmol/L (ref 20–29)
CREATININE: 1.46 mg/dL — AB (ref 0.76–1.27)
Calcium: 9.1 mg/dL (ref 8.6–10.2)
Chloride: 94 mmol/L — ABNORMAL LOW (ref 96–106)
GFR, EST AFRICAN AMERICAN: 55 mL/min/{1.73_m2} — AB (ref >59)
GFR, EST NON AFRICAN AMERICAN: 47 mL/min/{1.73_m2} — AB (ref >59)
Glucose: 79 mg/dL (ref 65–99)
Potassium: 4.5 mmol/L (ref 3.5–5.2)
Sodium: 138 mmol/L (ref 134–144)

## 2017-12-09 NOTE — Telephone Encounter (Signed)
LMTCB/sss  Called patient regarding (4) VT-1 episodes treated successfully with ATP.

## 2017-12-10 NOTE — Telephone Encounter (Signed)
Spoke with pt about ATP episodes from 12/07/17, pt stated that he went to work that day about 8:15am and pt had taken his Amiodarone 200mg  daily that morning. Pt didn't report any passing out or dizziness. Pt aware of driving restrictions x 6 months.

## 2017-12-17 ENCOUNTER — Ambulatory Visit (INDEPENDENT_AMBULATORY_CARE_PROVIDER_SITE_OTHER): Payer: Medicare Other | Admitting: *Deleted

## 2017-12-17 DIAGNOSIS — Z5181 Encounter for therapeutic drug level monitoring: Secondary | ICD-10-CM

## 2017-12-17 DIAGNOSIS — Z7901 Long term (current) use of anticoagulants: Secondary | ICD-10-CM | POA: Diagnosis not present

## 2017-12-17 DIAGNOSIS — Z95828 Presence of other vascular implants and grafts: Secondary | ICD-10-CM

## 2017-12-17 DIAGNOSIS — Z952 Presence of prosthetic heart valve: Secondary | ICD-10-CM

## 2017-12-17 LAB — POCT INR: INR: 1.4

## 2017-12-17 NOTE — Patient Instructions (Signed)
Description   Today Feb 5th take another 1/2 tablet as he has already taken 1/2 tablet making total for today of coumadin 5mg  then tomorrow Feb 6th take 1 tablet (5mg ) then   continue taking 1 tablet each Thursday, 1/2 tablet all other days.  Repeat INR in 10 days  (pt refuses f/u in 10 days ) states will come back in 4 weeks. Coumadin Clinic#[phone] Main#[phone] Pt is aware of possible complications in not coming back as requested

## 2017-12-24 ENCOUNTER — Ambulatory Visit (HOSPITAL_COMMUNITY)
Admission: RE | Admit: 2017-12-24 | Discharge: 2017-12-24 | Disposition: A | Discharge status: Home / Self Care | Payer: Medicare Other | Source: Ambulatory Visit | Attending: Internal Medicine | Admitting: Internal Medicine

## 2017-12-24 DIAGNOSIS — I712 Thoracic aortic aneurysm, without rupture, unspecified: Secondary | ICD-10-CM

## 2018-01-09 LAB — BASIC METABOLIC PANEL
BUN/Creatinine Ratio: 11 (ref 10–24)
BUN: 16 mg/dL (ref 8–27)
CALCIUM: 9.1 mg/dL (ref 8.6–10.2)
CO2: 26 mmol/L (ref 20–29)
Chloride: 97 mmol/L (ref 96–106)
Creatinine, Ser: 1.43 mg/dL — ABNORMAL HIGH (ref 0.76–1.27)
GFR, EST AFRICAN AMERICAN: 56 mL/min/{1.73_m2} — AB (ref >59)
GFR, EST NON AFRICAN AMERICAN: 49 mL/min/{1.73_m2} — AB (ref >59)
Glucose: 75 mg/dL (ref 65–99)
POTASSIUM: 4.8 mmol/L (ref 3.5–5.2)
Sodium: 135 mmol/L (ref 134–144)

## 2018-01-14 ENCOUNTER — Ambulatory Visit (INDEPENDENT_AMBULATORY_CARE_PROVIDER_SITE_OTHER): Payer: Medicare Other | Admitting: *Deleted

## 2018-01-14 DIAGNOSIS — I359 Nonrheumatic aortic valve disorder, unspecified: Secondary | ICD-10-CM | POA: Diagnosis not present

## 2018-01-14 DIAGNOSIS — I420 Dilated cardiomyopathy: Secondary | ICD-10-CM | POA: Diagnosis not present

## 2018-01-14 DIAGNOSIS — Z952 Presence of prosthetic heart valve: Secondary | ICD-10-CM

## 2018-01-14 DIAGNOSIS — Z95828 Presence of other vascular implants and grafts: Secondary | ICD-10-CM

## 2018-01-14 DIAGNOSIS — Z7901 Long term (current) use of anticoagulants: Secondary | ICD-10-CM | POA: Diagnosis not present

## 2018-01-14 DIAGNOSIS — Z5181 Encounter for therapeutic drug level monitoring: Secondary | ICD-10-CM | POA: Diagnosis not present

## 2018-01-14 LAB — POCT INR: INR: 5.4

## 2018-01-14 NOTE — Progress Notes (Signed)
Remote ICD transmission.   

## 2018-01-14 NOTE — Patient Instructions (Addendum)
Description   Do not take coumadin tomorrow March 6th as he has taken today then no coumadin on March 7th then do dose as instructed  1 tablet each Thursday and  1/2 tablet all other days.  Repeat INR in 10 days  (pt refuses f/u in 10 days ) states will come back in 5 weeks. Coumadin Clinic#[phone] Main#[phone] Pt is aware of possible complications in not coming back as requested

## 2018-01-15 ENCOUNTER — Encounter: Payer: Self-pay | Admitting: Cardiology

## 2018-01-15 ENCOUNTER — Other Ambulatory Visit: Payer: Self-pay | Admitting: Internal Medicine

## 2018-01-16 LAB — CUP PACEART REMOTE DEVICE CHECK
Date Time Interrogation Session: 20190307101930
MDC IDC LEAD IMPLANT DT: 20180831
MDC IDC LEAD LOCATION: 753860
MDC IDC LEAD SERIAL: 435986
MDC IDC PG IMPLANT DT: 20180831
Pulse Gen Serial Number: 240202

## 2018-02-03 IMAGING — DX DG CHEST 1V PORT
1 series · 1 of 1 positions shown · non-contrast
Comparison: Single-view of the chest 05/14/2017 and 05/13/2017.

CLINICAL DATA: Status post replacement of the ascending aorta/Wheat
procedure 04/30/2017. Right chest tube in place.

EXAM:
PORTABLE CHEST 1 VIEW

[chest ap]
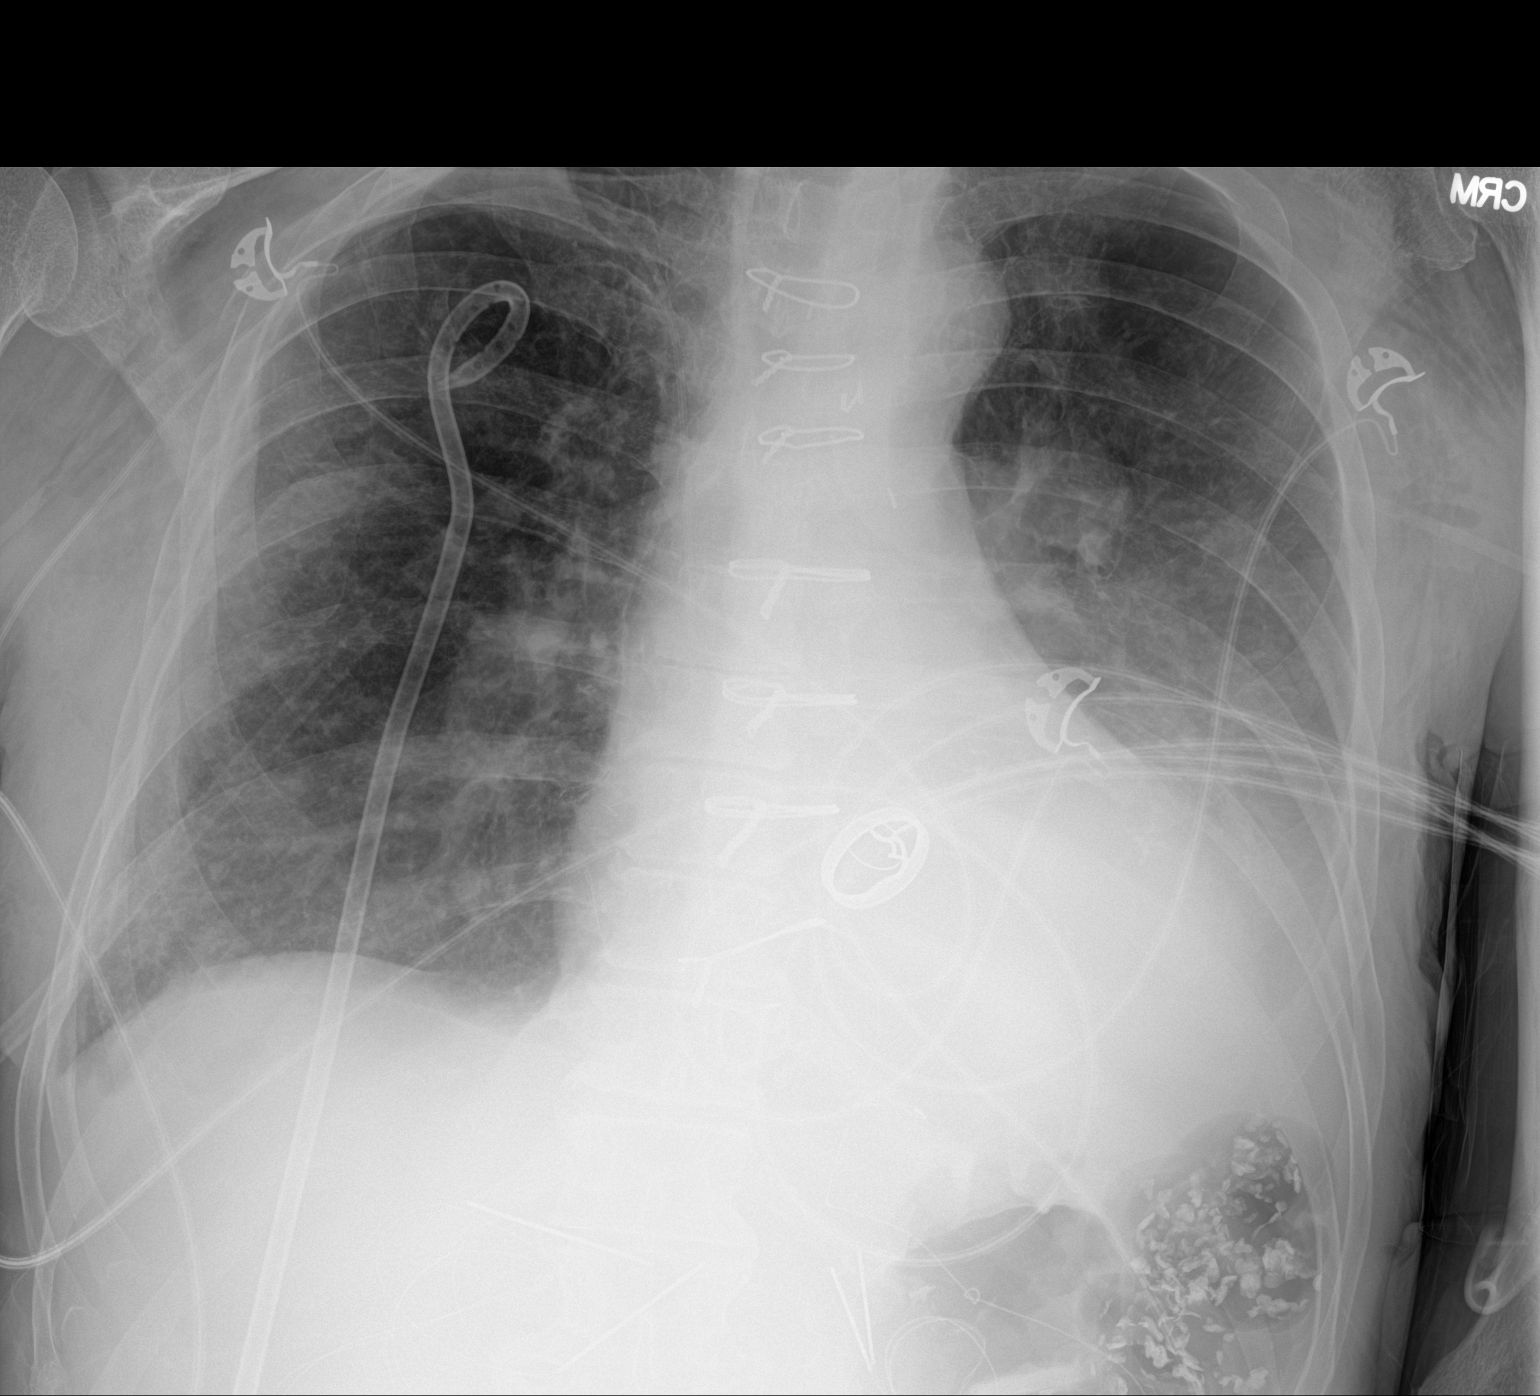

[1 of 1 positions shown; findings below may reference images not displayed]

FINDINGS: Small bore right chest tube remains in place. There is a tiny right
apical pneumothorax, unchanged. Subcutaneous emphysema is seen over
the chest. Left pleural effusion appears slightly decreased compared
to yesterday's examination. Left basilar airspace disease is
unchanged. Prosthetic aortic valve and intact median sternotomy
wires are unchanged.
IMPRESSION: Tiny right apical pneumothorax with a chest tube in place,
unchanged.

Slight decrease in a left pleural effusion. Left basilar airspace
disease unchanged.

## 2018-02-03 IMAGING — DX DG CHEST 1V PORT
2 series · 2 of 2 positions shown · non-contrast
Comparison: Most recent radiographs earlier this day. Chest CT
05/07/2017

CLINICAL DATA: Chest tube removal this morning.

EXAM:
PORTABLE CHEST 1 VIEW

[chest ap (1 of 2)]
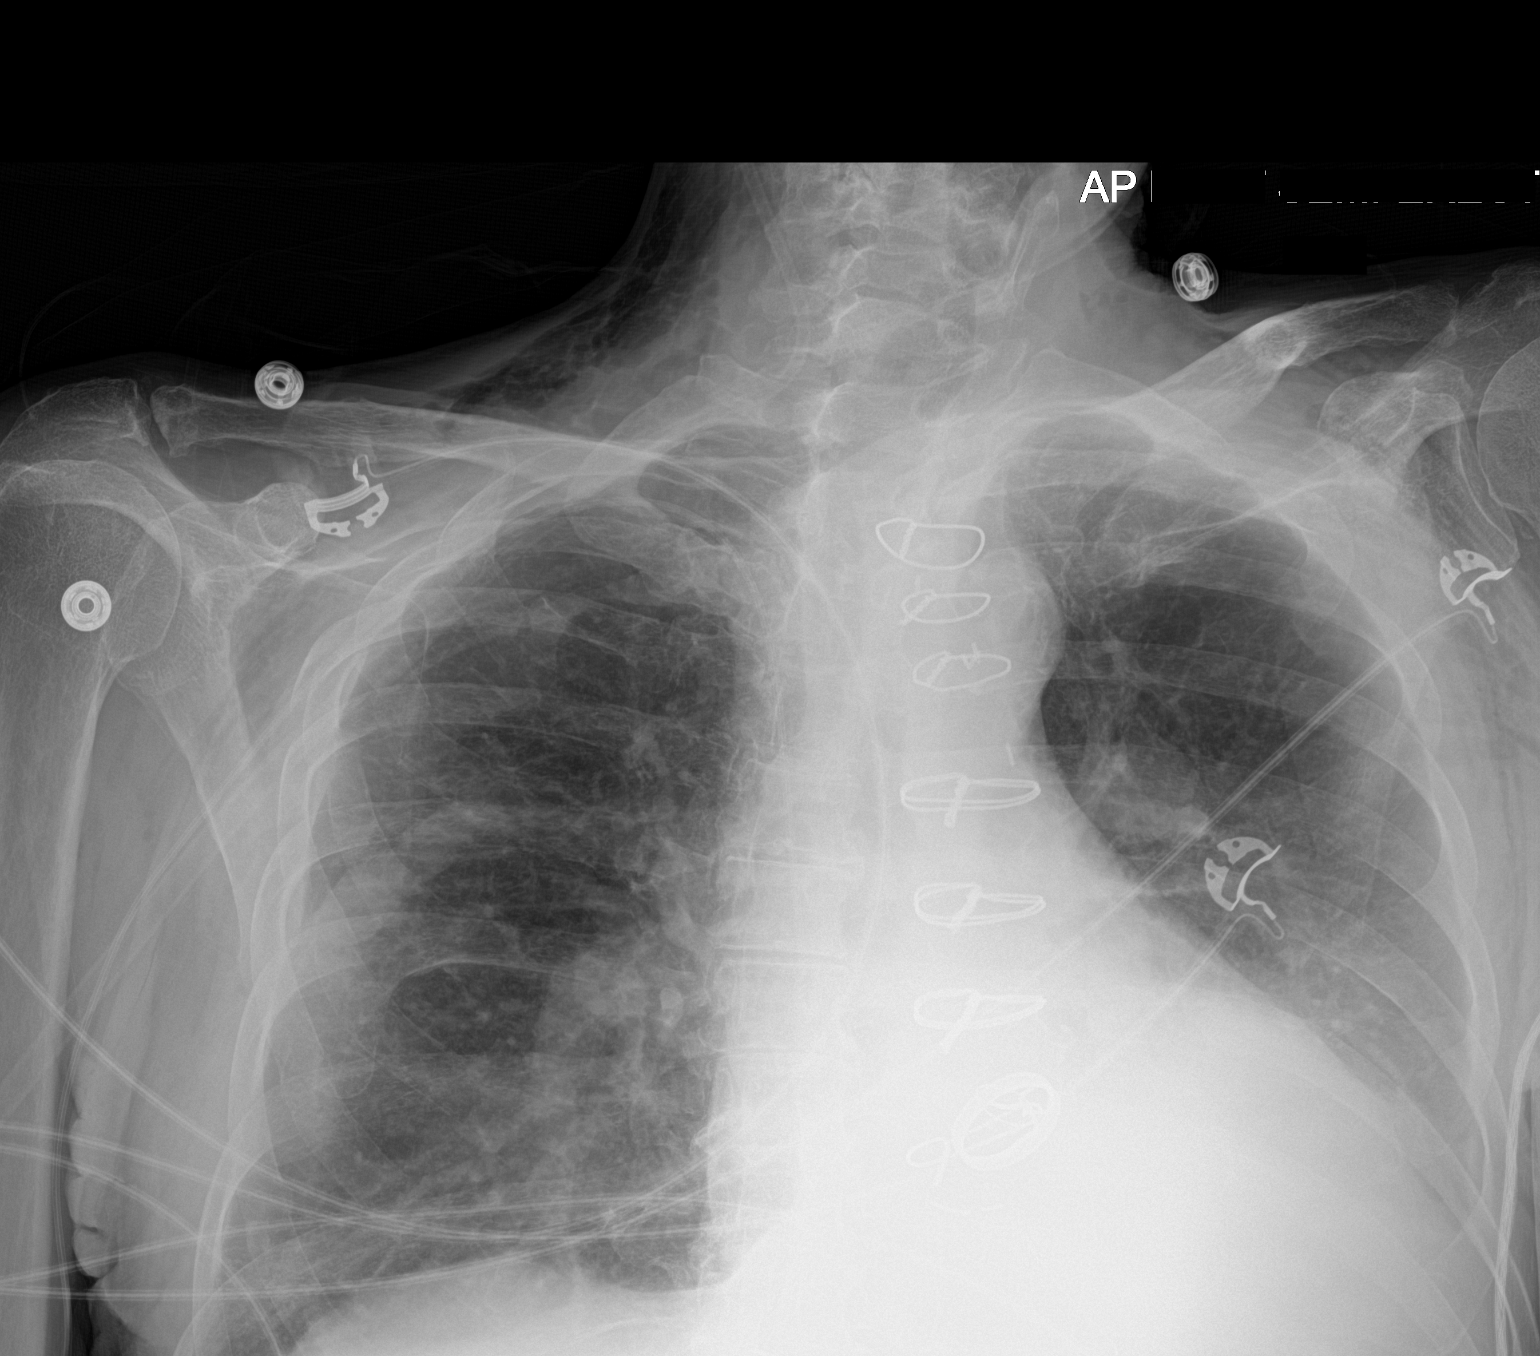

[chest ap (2 of 2)]
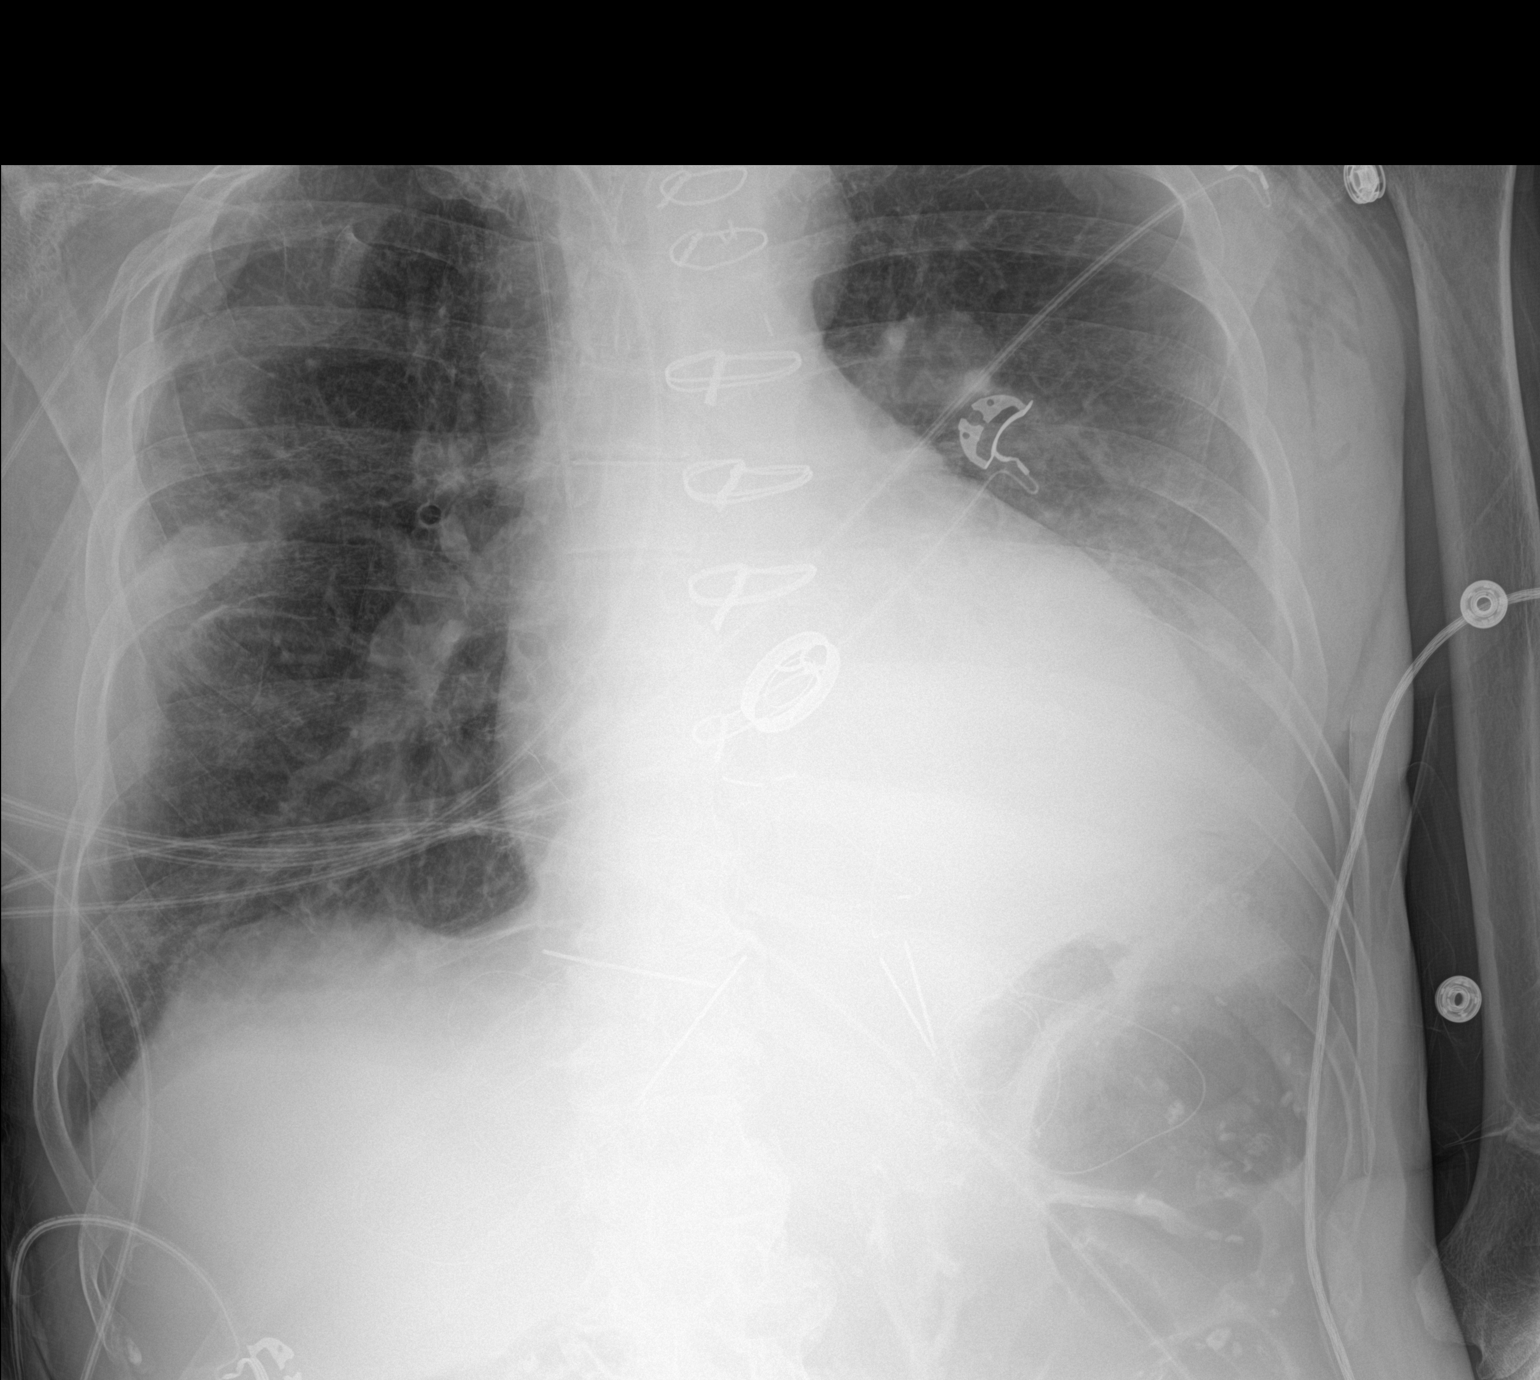

[2 of 2 positions shown; findings below may reference images not displayed]

FINDINGS: Right pigtail catheter is been removed. Previous tiny apical
pneumothorax is tentatively identified and unchanged. No
progression. Right upper extremity PICC tip in the mid SVC. Linear
peripheral opacity in the right midlung may be minimal fluid in the
fissure or atelectasis, this appears along the course of prior chest
tube.

Stable cardiomegaly, median sternotomy and prosthetic valve. Stable
retrocardiac opacity in left pleural effusion. Subcutaneous
emphysema again seen, not significantly changed.
IMPRESSION: 1. Removal of right pigtail catheter. Unchanged tiny right apical
pneumothorax from exam earlier this day.
2. Minimal developing linear opacity in the right mid lung may be
pleural fluid in the fissure or atelectasis/scarring, may be along
the course of prior chest tube.
3. Unchanged left lung base opacity and pleural effusion.

## 2018-02-05 IMAGING — CR DG CHEST 1V PORT
1 series · 1 of 1 positions shown · non-contrast
Comparison: 05/16/2017.

CLINICAL DATA: Post cardiac thoracic surgery, replacement of
ascending aorta.

EXAM:
PORTABLE CHEST 1 VIEW

[AP]
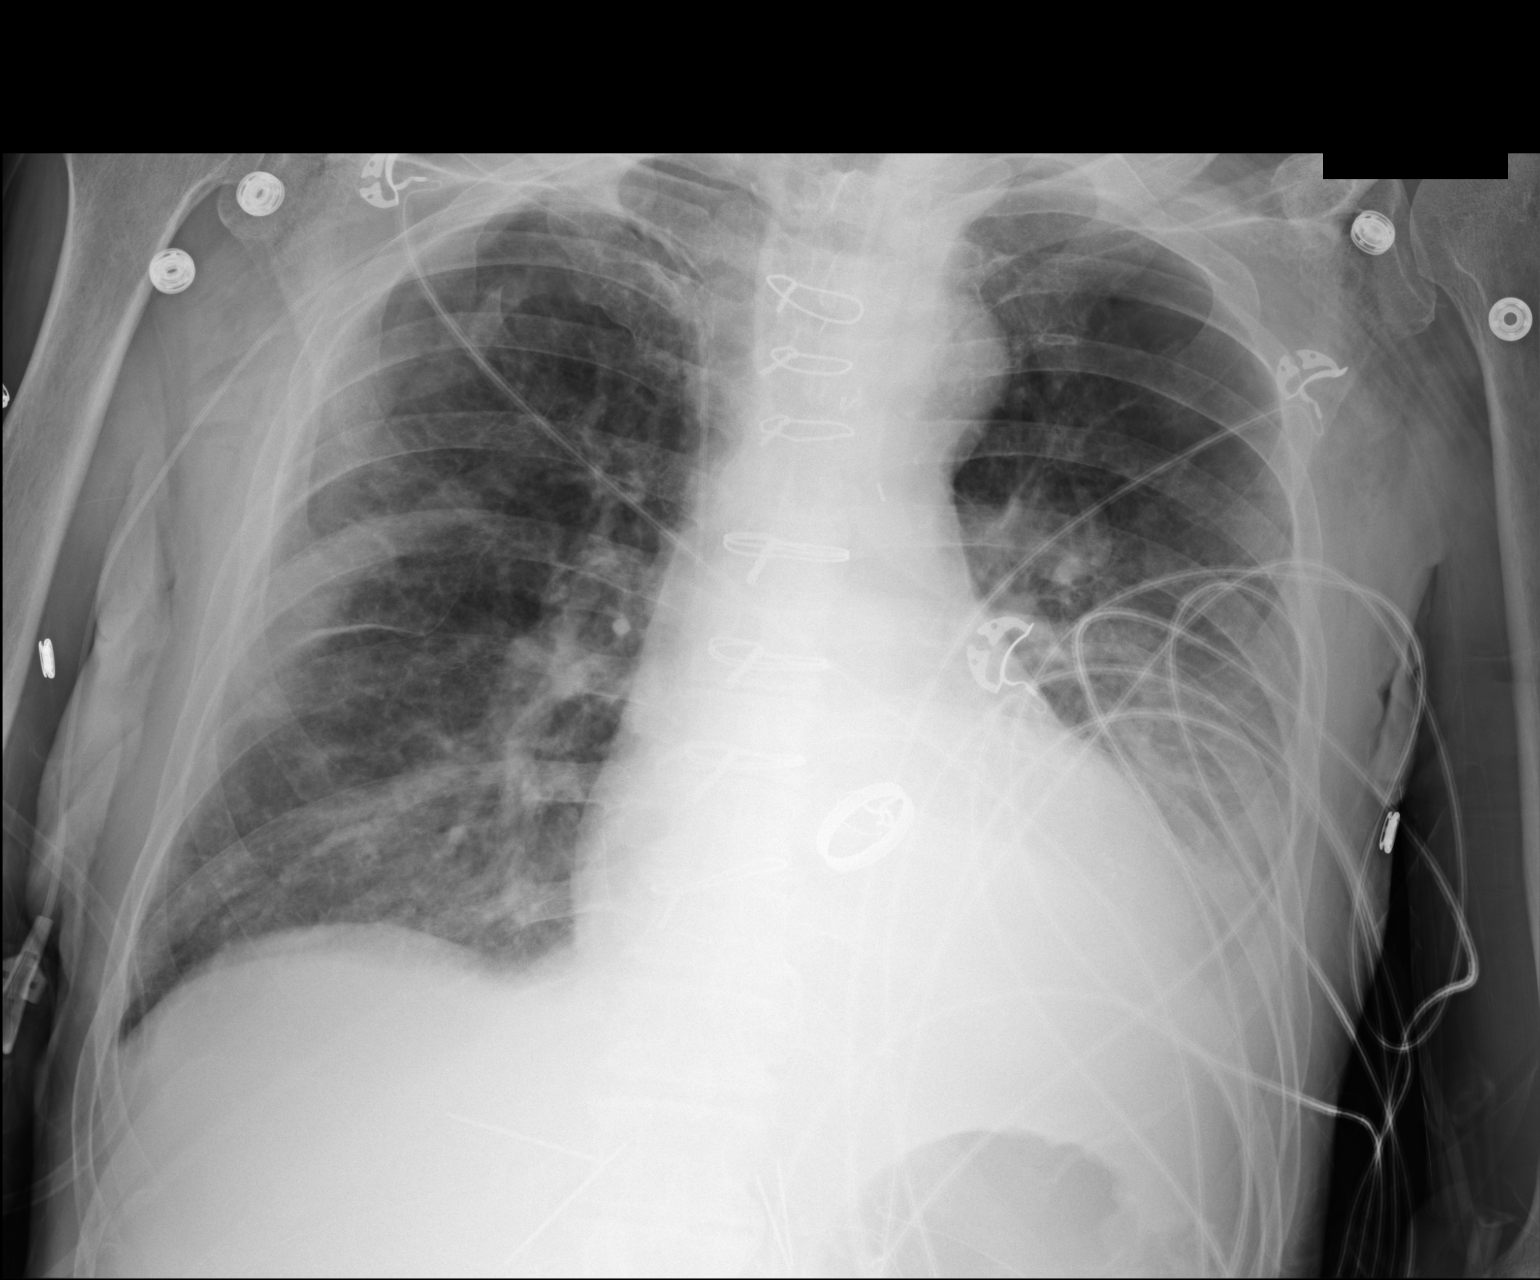

[1 of 1 positions shown; findings below may reference images not displayed]

FINDINGS: Right PICC line in stable position. Mediastinum including aortic
contour stable. Prior cardiac valve replacement. Stable
cardiomegaly. Stable left lower lobe atelectasis and infiltrate.
Stable small left pleural effusion. Stable right sided
pleuroparenchymal thickening at prior chest tube site skin folds
again noted. No pneumothorax. Right chest wall subcutaneous
emphysema, improved from prior exam .
IMPRESSION: 1. Right PICC line stable position. No pneumothorax. Right chest
wall subcutaneous emphysema has improved from prior exam.

2. Stable left lower lobe atelectasis and infiltrate. Stable small
left pleural effusion.

3.  Prior cardiac valve replacement.  Stable cardiomegaly.

## 2018-02-06 IMAGING — CR DG CHEST 1V PORT
1 series · 1 of 1 positions shown · non-contrast
Comparison: Chest radiograph 05/17/2017.

CLINICAL DATA: Patient with history of chest tube.

EXAM:
PORTABLE CHEST 1 VIEW

[AP]
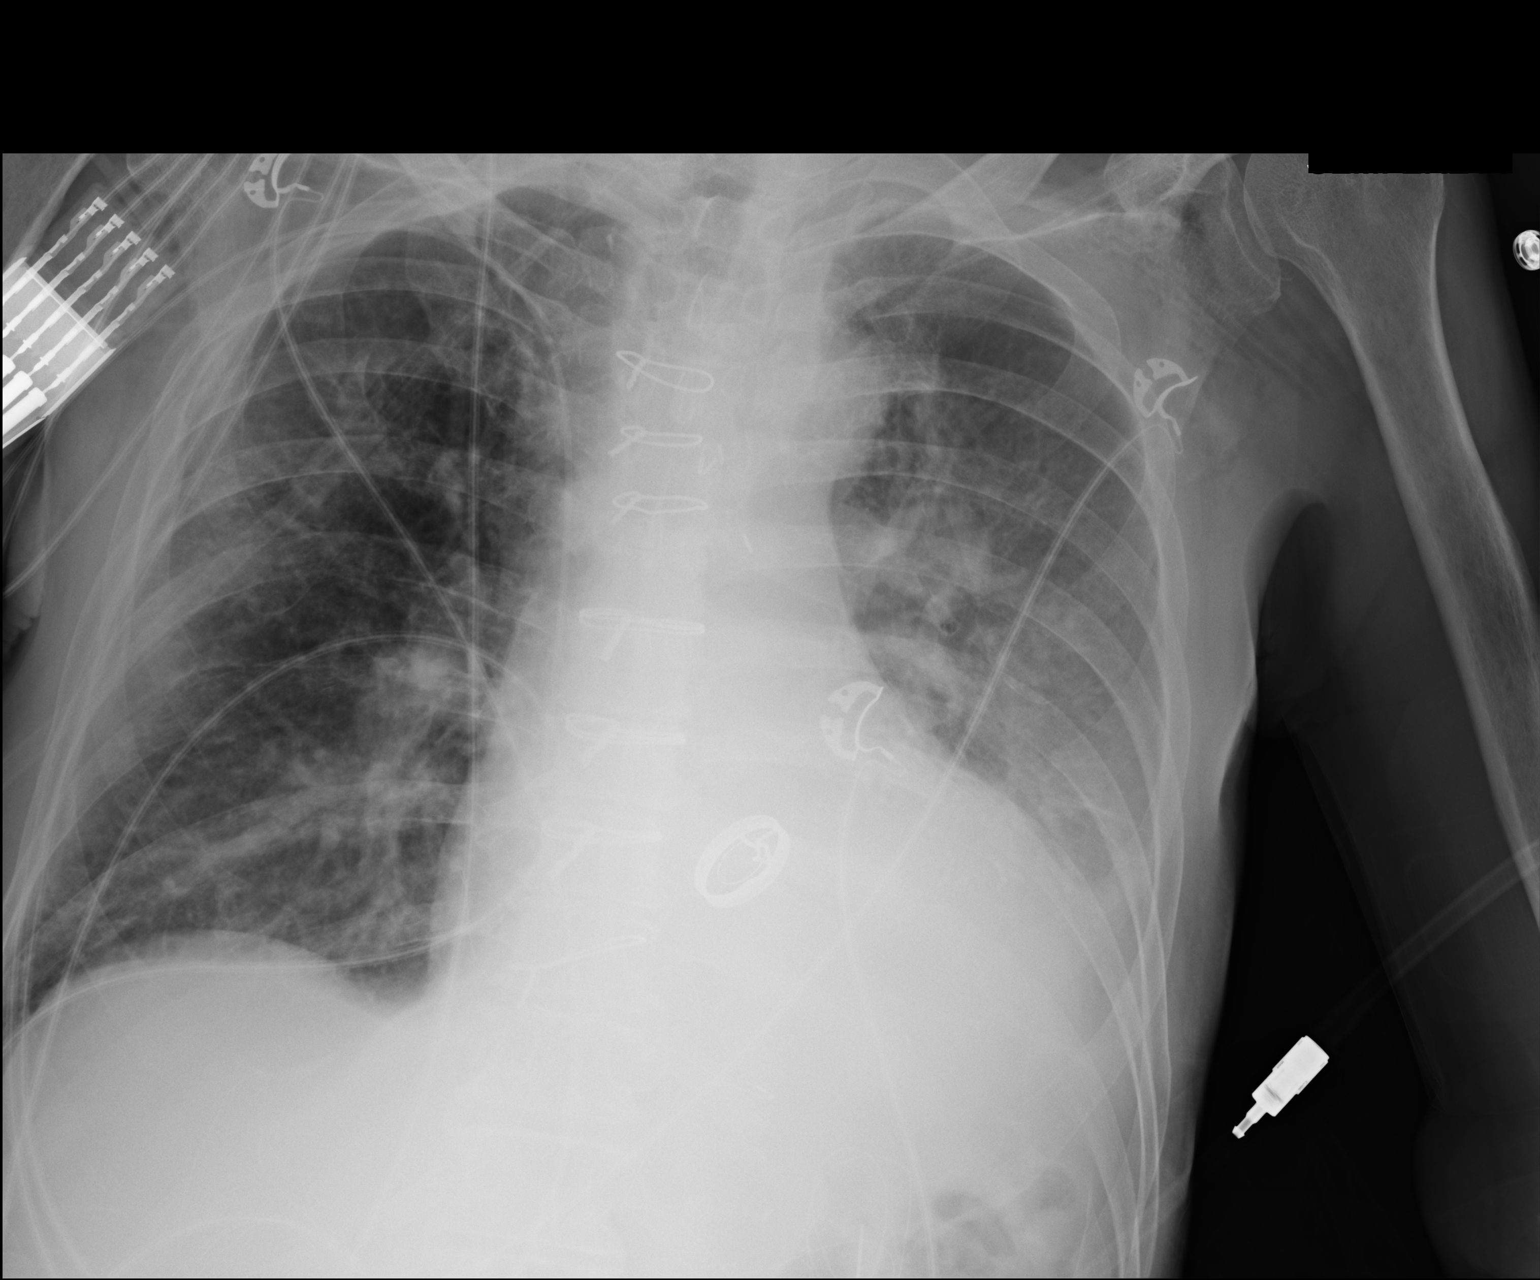

[1 of 1 positions shown; findings below may reference images not displayed]

FINDINGS: Right upper extremity PICC line tip projects over the superior vena
cava. Monitoring leads overlie the patient. Stable enlarged cardiac
and mediastinal contours. Grossly unchanged left mid lower lung
heterogeneous pulmonary opacities. Small left pleural effusion. No
pneumothorax. Small amount of right chest wall subcutaneous
emphysema.
IMPRESSION: Stable heterogeneous opacities left mid lower lung which may
represent atelectasis or infection. Small left pleural effusion.

## 2018-02-08 IMAGING — CR DG CHEST 1V PORT
1 series · 1 of 1 positions shown · non-contrast
Comparison: 05/18/2017

CLINICAL DATA: Atelectasis

EXAM:
PORTABLE CHEST 1 VIEW

[AP]
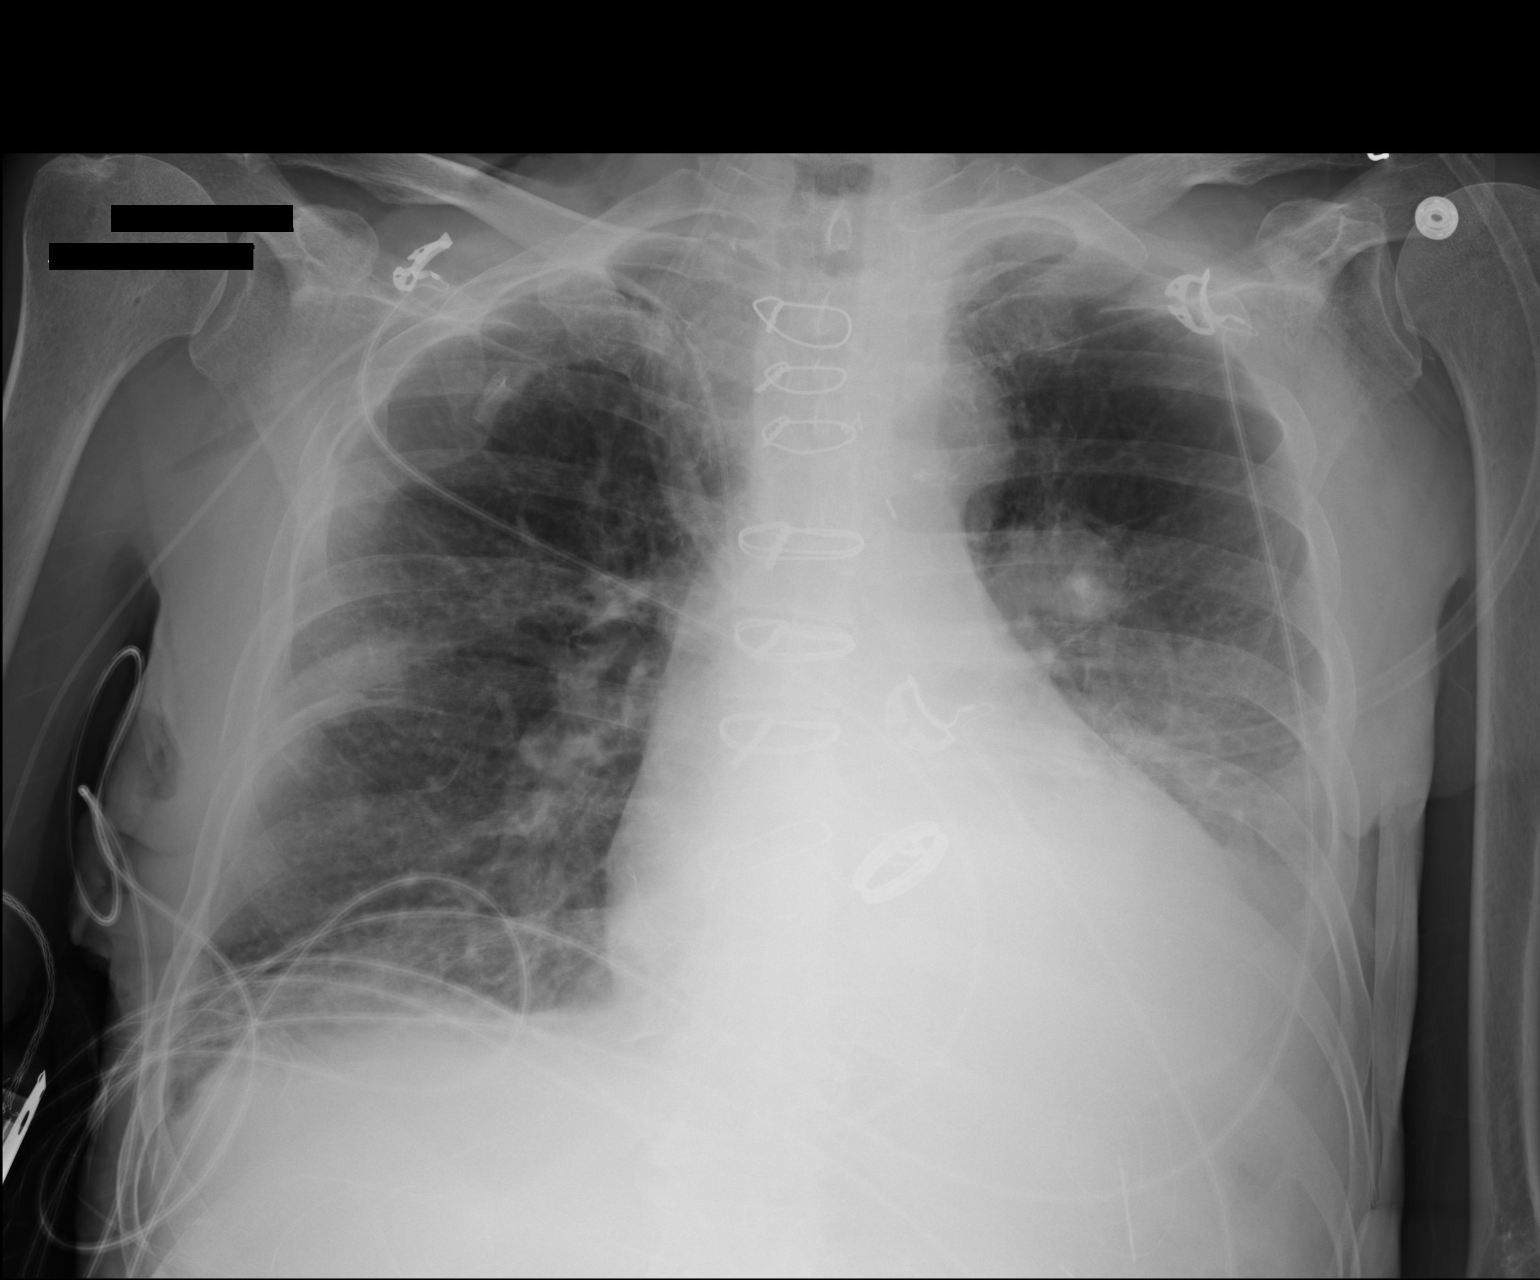

[1 of 1 positions shown; findings below may reference images not displayed]

FINDINGS: Left lower lobe consolidation and left effusion unchanged. Mild
right lower lobe atelectasis unchanged.

PICC tip in the SVC. Aortic valve replacement unchanged. Mild
subcutaneous emphysema unchanged.
IMPRESSION: No significant interval change. Left lower lobe consolidation
remains unchanged.

## 2018-02-19 ENCOUNTER — Ambulatory Visit (INDEPENDENT_AMBULATORY_CARE_PROVIDER_SITE_OTHER): Payer: Medicare Other | Admitting: *Deleted

## 2018-02-19 DIAGNOSIS — Z952 Presence of prosthetic heart valve: Secondary | ICD-10-CM

## 2018-02-19 DIAGNOSIS — I359 Nonrheumatic aortic valve disorder, unspecified: Secondary | ICD-10-CM

## 2018-02-19 DIAGNOSIS — Z5181 Encounter for therapeutic drug level monitoring: Secondary | ICD-10-CM

## 2018-02-19 DIAGNOSIS — Z95828 Presence of other vascular implants and grafts: Secondary | ICD-10-CM

## 2018-02-19 DIAGNOSIS — Z7901 Long term (current) use of anticoagulants: Secondary | ICD-10-CM | POA: Diagnosis not present

## 2018-02-19 LAB — POCT INR: INR: 3.8

## 2018-02-19 NOTE — Patient Instructions (Signed)
Description   Do not take coumadin tomorrow April 11th as he has taken today  then continue   1 tablet each Thursday and  1/2 tablet all other days.  Repeat INR in 2 weeks states will come back in 6 weeks. Coumadin Clinic#[phone] Main#[phone] Pt is aware of possible complications in not coming back as requested

## 2018-03-03 ENCOUNTER — Other Ambulatory Visit: Payer: Self-pay | Admitting: Cardiology

## 2018-03-15 NOTE — Progress Notes (Signed)
Jack Joseph Date of Birth: Sep 10, 1946   History of Present Illness: Jack Joseph is seen today for post hospital follow up.  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 He is on chronic Coumadin therapy. He has a history of dilated cardiomyopathy with prior ejection fraction of 30-35%.   When seen earlier this year CXR showed a left hilar rounded mass and CT was recommended. 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 An Echo was also obtained showing normal functioning of his prosthetic AV. It confirmed the ascending aortic aneurysm. LV function was severely reduced with EF 25-30%. There was mild pulmonary HTN. Cardiac cath showed mild nonobstructive CAD with normal right heart pressures and cardiac output. The prosthetic AV disc moved well on fluoro.   On 04/29/17 he underwent a Wheat procedure with replacement of his ascending aorta by Dr. Tyrone Sage. The prior AV prosthesis was left in place. His post op course was complicated. He was initially extubated on the evening of surgery and inotropes were weaned. On the next day he developed pulseless VT with cardiac arrest. He required cardioversion, CPR, and reintubation. He was loaded on IV amiodarone. He developed a pneumothorax and had a chest tube placed. On POD #3 he was once again extubated. He developed subcutaneous emphysema on the right and had a second chest tube placed. He required reintubation again due to mucus plugging. On POD#6 he was extubated again. On POD #8 his subcutaneous emphysema worsened and his right chest tube had to be replaced. He required transfusion on 2 occasions for anemia. On POD #8 he had increased atelectasis. He had bronchoscopy. Breathing again worsened and he was reintubated. He required TPN for nutritional support and IV pressors for BP support. He improved and was able to be  extubated on POD #13 and chest tubes removed on POD #16. Diet was advanced and he was transitioned to coumadin. He also developed diarrhea and was C. Diff positive. This was treated. On July 12 he was transferred to inpatient Rehab for PT/OT. He continued to have SOB and hypoxia treated with lasix and Xopenex. This improved and he was weaned off oxygen. He was DC home on July 20 on coumadin, amiodarone, and losartan. Lifevest was ordered. Echo on July 9 showed EF 20-25%. Dr. Ladona Ridgel recommended consideration for ICD implant and this was done on 07/12/17. Amiodarone dose decreased to 200 mg daily except none on Sunday. Seen in follow up with EP in December and recommended continued current therapy.   Last ICD check in March showed 4 VT episodes one of which was pace terminated. All asymptomatic.   On follow up today he is is doing OK. 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  He denies any chest pain or dyspnea.  He is losing weight and is down 12 lbs. No symptomatic VT.    No ICD discharges.  He does get tired and fatigues easily. Recent splinter removed from hand and developed some pus there which he drained.   Current Outpatient Medications on File Prior to Visit  Medication Sig Dispense Refill  . amiodarone (PACERONE) 200 MG tablet Take 200 mg daily except none on Sundays 90 tablet 3  . losartan (COZAAR) 25 MG tablet Take 0.5 tablets (12.5 mg total) by mouth daily. 45 tablet 3  . spironolactone (ALDACTONE) 25 MG tablet Take 0.5 tablets (  12.5 mg total) by mouth daily. 45 tablet 3  . warfarin (COUMADIN) 5 MG tablet Take as directed by coumadin clinic    . warfarin (COUMADIN) 5 MG tablet TAKE 1/2 to 1 TABLET BY MOUTH ONCE DAILY OR AS DIRECTED BY COUMADIN CLINIC 90 tablet 1   No current facility-administered medications on file prior to visit.     Allergies  Allergen Reactions  . Morphine And Related Other (See Comments)    Hallucinations     Past Medical History:  Diagnosis  Date  . AVD (aortic valve disease)   . Chronic anticoagulation   . Dilated cardiomyopathy (HCC)    EF 30-35%  . Dysrhythmia   . Hyperlipidemia   . LV dysfunction     Past Surgical History:  Procedure Laterality Date  . AORTIC VALVE REPLACEMENT    . BENTALL PROCEDURE N/A 04/29/2017   Procedure: REPLACEMENT ASCENDING AORTA/ WHEAT PROCEDURE, HYPOTHERMIC CIRCULATORY ARREST AND CARDIOPULMONARY BYPASS, AND USE OF HEMASHIELD PLATINUM WOVEN DOUBLE VELOUR VASCULAR GRAFT 32 MM X 50 CM;  Surgeon: [name], MD;  Location: [facility] OR;  Service: Open Heart Surgery;  Laterality: N/A;  . DOPPLER ECHOCARDIOGRAPHY  04/07/2002   EF 30-35%  . ICD IMPLANT N/A 07/12/2017   Procedure: ICD Implant;  Surgeon: Marinus Maw, MD;  Location: [facility] INVASIVE CV LAB;  Service: Cardiovascular;  Laterality: N/A;  . RIGHT/LEFT HEART CATH AND CORONARY ANGIOGRAPHY N/A 03/05/2017   Procedure: Right/Left Heart Cath and Coronary Angiography;  Surgeon: Ragnar Waas M [place], MD;  Location: [facility] INVASIVE CV LAB;  Service: Cardiovascular;  Laterality: N/A;  . TEE WITHOUT CARDIOVERSION N/A 04/29/2017   Procedure: TRANSESOPHAGEAL ECHOCARDIOGRAM (TEE);  Surgeon: [name], MD;  Location: [facility] OR;  Service: Open Heart Surgery;  Laterality: N/A;    Social History   Tobacco Use  Smoking Status Former Smoker  . Last attempt to quit: 11/12/2000  . Years since quitting: 17.3  Smokeless Tobacco Never Used    Social History   Substance and Sexual Activity  Alcohol Use No    Family History  Problem Relation Age of Onset  . Heart disease Brother     Review of Systems: As noted in history of present illness.  All other systems were reviewed and are negative.  Physical Exam: BP (!) 98/50 (BP Location: Left Arm, Patient Position: Sitting, Cuff Size: Normal)   Pulse 76   Ht 5\' 10"  (1.778 m)   Wt 138 lb (62.6 kg)   BMI 19.80 kg/m  GENERAL:  Well appearing, thin WM in NAD HEENT:  PERRL, EOMI, sclera are clear. Oropharynx is  clear. NECK:  No jugular venous distention, carotid upstroke brisk and symmetric, no bruits, no thyromegaly or adenopathy LUNGS:  Clear to auscultation bilaterally CHEST:  Unremarkable HEART:  RRR,  PMI not displaced or sustained,good mechanical AV click. no S3, no S4: no clicks, no rubs, no murmurs ABD:  Soft, nontender. BS +, no masses or bruits. No hepatomegaly, no splenomegaly EXT:  2 + pulses throughout, 1-2+ ankle edema, no cyanosis no clubbing. Hand is mildly swollen and tender where splinter removed. SKIN:  Warm and dry.  No rashes NEURO:  Alert and oriented x 3. Cranial nerves II through XII intact. PSYCH:  Cognitively intact     LABORATORY DATA:  Lab Results  Component Value Date   WBC 7.4 07/10/2017   HGB 12.0 (L) 07/10/2017   HCT 37.7 07/10/2017   PLT 324 07/10/2017   GLUCOSE 75 01/09/2018   CHOL 171  09/16/2017   TRIG 63.0 09/16/2017   HDL 55.00 09/16/2017   LDLCALC 103 (H) 09/16/2017   ALT 23 05/24/2017   AST 20 05/24/2017   NA 135 01/09/2018   K 4.8 01/09/2018   CL 97 01/09/2018   CREATININE 1.43 (H) 01/09/2018   BUN 16 01/09/2018   CO2 26 01/09/2018   TSH 4.065 05/09/2017   PSA 0.83 09/16/2017   INR 3.8 02/19/2018   HGBA1C 5.7 (H) 04/25/2017     Echo 05/20/17:Study Conclusions  - Procedure narrative: Transthoracic echocardiography. Image   quality was poor. The study was technically difficult, as a   result of poor sound wave transmission. - Left ventricle: The cavity size was mildly dilated. Wall   thickness was normal. Systolic function was severely reduced. The   estimated ejection fraction was in the range of 20% to 25%.   Diffuse hypokinesis. Features are consistent with a pseudonormal   left ventricular filling pattern, with concomitant abnormal   relaxation and increased filling pressure (grade 2 diastolic   dysfunction). - Aortic valve: A mechanical prosthesis was present. - Mitral valve: There was mild regurgitation. - Pericardium,  extracardiac: There was a left pleural effusion.  Impressions:  - Severe global reduction in LV systolic function; moderate   diastolic dysfunction; mild LVE; s/p AVR with normal mean   gradient; mildly dilated aortic root; mild MR.   Assessment / Plan: 1. Remote Status post aortic valve replacement with a Bjork Shiley mechanical prosthesis. He is on chronic Coumadin therapy.  Last INR 3.8. Good click on exam. Good valve function by Echo. Coumadin being adjusted by pharmacy. SBE prophylaxis.   2. Dilated cardiomyopathy. Ejection fraction of 20-25% post Aortic aneurysm repair.  Patient is class 1-2. Only on losartan and aldactone at low doses. Unable to titrate due to low BP.   3. Thoracic aortic aneurysm. Ascending aorta. 6.3 cm.  Now s/p Wheat procedure with replacement. 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 Post op course very complicated due to development of VT, acute respiratory failure, subQ emphysema, multiple chest tubes, anemia, and deconditioning. He clinically is doing much better now.   4. VT following Aortic aneurysm surgery. Low EF. S/p ICD implant. Continue amiodarone  6 days/week. Need to check TFTS and chemistries especially with weight loss.   5. Abdominal bruit. [place] negative for AAA.  Borderline iliac disease.  Follow up in 6 months.

## 2018-03-18 ENCOUNTER — Ambulatory Visit (INDEPENDENT_AMBULATORY_CARE_PROVIDER_SITE_OTHER): Payer: Medicare Other | Admitting: Cardiology

## 2018-03-18 ENCOUNTER — Encounter: Payer: Self-pay | Admitting: Cardiology

## 2018-03-18 VITALS — BP 98/50 | HR 76 | Ht 70.0 in | Wt 138.0 lb

## 2018-03-18 DIAGNOSIS — I359 Nonrheumatic aortic valve disorder, unspecified: Secondary | ICD-10-CM | POA: Diagnosis not present

## 2018-03-18 DIAGNOSIS — I472 Ventricular tachycardia, unspecified: Secondary | ICD-10-CM

## 2018-03-18 DIAGNOSIS — Z95828 Presence of other vascular implants and grafts: Secondary | ICD-10-CM

## 2018-03-18 DIAGNOSIS — I5022 Chronic systolic (congestive) heart failure: Secondary | ICD-10-CM | POA: Diagnosis not present

## 2018-03-18 DIAGNOSIS — I420 Dilated cardiomyopathy: Secondary | ICD-10-CM | POA: Diagnosis not present

## 2018-03-18 MED ORDER — AMOXICILLIN 500 MG PO CAPS: 500.0000 mg | ORAL_CAPSULE | Freq: Three times a day (TID) | ORAL | 0 refills | Status: DC

## 2018-03-18 NOTE — Patient Instructions (Addendum)
We will check blood work today  Continue your current therapy for now.   Take amoxicillin 500 mg tid for your hand for one week.

## 2018-03-19 ENCOUNTER — Other Ambulatory Visit: Payer: Self-pay

## 2018-03-19 DIAGNOSIS — E039 Hypothyroidism, unspecified: Secondary | ICD-10-CM

## 2018-03-19 LAB — COMPREHENSIVE METABOLIC PANEL
ALBUMIN: 4.2 g/dL (ref 3.5–4.8)
ALK PHOS: 82 IU/L (ref 39–117)
ALT: 8 IU/L (ref 0–44)
AST: 22 IU/L (ref 0–40)
Albumin/Globulin Ratio: 1.5 (ref 1.2–2.2)
BILIRUBIN TOTAL: 0.6 mg/dL (ref 0.0–1.2)
BUN / CREAT RATIO: 14 (ref 10–24)
BUN: 20 mg/dL (ref 8–27)
CHLORIDE: 95 mmol/L — AB (ref 96–106)
CO2: 24 mmol/L (ref 20–29)
CREATININE: 1.45 mg/dL — AB (ref 0.76–1.27)
Calcium: 9.2 mg/dL (ref 8.6–10.2)
GFR calc Af Amer: 55 mL/min/{1.73_m2} — ABNORMAL LOW (ref >59)
GFR calc non Af Amer: 48 mL/min/{1.73_m2} — ABNORMAL LOW (ref >59)
GLOBULIN, TOTAL: 2.8 g/dL (ref 1.5–4.5)
Glucose: 121 mg/dL — ABNORMAL HIGH (ref 65–99)
Potassium: 4.9 mmol/L (ref 3.5–5.2)
Sodium: 135 mmol/L (ref 134–144)
Total Protein: 7 g/dL (ref 6.0–8.5)

## 2018-03-19 LAB — CBC WITH DIFFERENTIAL/PLATELET
Basophils Absolute: 0.1 10*3/uL (ref 0.0–0.2)
Basos: 1 %
EOS (ABSOLUTE): 0.1 10*3/uL (ref 0.0–0.4)
EOS: 1 %
HEMATOCRIT: 40.3 % (ref 37.5–51.0)
HEMOGLOBIN: 13.3 g/dL (ref 13.0–17.7)
Immature Grans (Abs): 0 10*3/uL (ref 0.0–0.1)
Immature Granulocytes: 0 %
LYMPHS ABS: 2 10*3/uL (ref 0.7–3.1)
Lymphs: 27 %
MCH: 30 pg (ref 26.6–33.0)
MCHC: 33 g/dL (ref 31.5–35.7)
MCV: 91 fL (ref 79–97)
MONOCYTES: 0 %
Monocytes Absolute: 0 10*3/uL — ABNORMAL LOW (ref 0.1–0.9)
NEUTROS ABS: 5.4 10*3/uL (ref 1.4–7.0)
Neutrophils: 71 %
Platelets: 264 10*3/uL (ref 150–379)
RBC: 4.44 x10E6/uL (ref 4.14–5.80)
RDW: 14.6 % (ref 12.3–15.4)
WBC: 7.6 10*3/uL (ref 3.4–10.8)

## 2018-03-19 LAB — T4, FREE: Free T4: 1.05 ng/dL (ref 0.82–1.77)

## 2018-03-19 LAB — TSH: TSH: 8.38 u[IU]/mL — AB (ref 0.450–4.500)

## 2018-03-19 MED ORDER — LEVOTHYROXINE SODIUM 25 MCG PO TABS: 25.0000 ug | ORAL_TABLET | Freq: Every day | ORAL | 3 refills | Status: DC

## 2018-04-02 ENCOUNTER — Ambulatory Visit (INDEPENDENT_AMBULATORY_CARE_PROVIDER_SITE_OTHER): Payer: Medicare Other | Admitting: *Deleted

## 2018-04-02 DIAGNOSIS — I359 Nonrheumatic aortic valve disorder, unspecified: Secondary | ICD-10-CM | POA: Diagnosis not present

## 2018-04-02 DIAGNOSIS — Z952 Presence of prosthetic heart valve: Secondary | ICD-10-CM | POA: Diagnosis not present

## 2018-04-02 DIAGNOSIS — Z95828 Presence of other vascular implants and grafts: Secondary | ICD-10-CM | POA: Diagnosis not present

## 2018-04-02 DIAGNOSIS — Z7901 Long term (current) use of anticoagulants: Secondary | ICD-10-CM | POA: Diagnosis not present

## 2018-04-02 DIAGNOSIS — Z5181 Encounter for therapeutic drug level monitoring: Secondary | ICD-10-CM

## 2018-04-02 LAB — POCT INR: INR: 3.1 — AB (ref 2.0–3.0)

## 2018-04-02 NOTE — Patient Instructions (Signed)
Description   Continue 1 tablet each Thursday and  1/2 tablet all other days. Recheck in 6 weeks. Call Coumadin Clinic#[phone] or Main#[phone] with any medication changes.

## 2018-04-15 ENCOUNTER — Ambulatory Visit (INDEPENDENT_AMBULATORY_CARE_PROVIDER_SITE_OTHER): Payer: Medicare Other | Admitting: *Deleted

## 2018-04-15 DIAGNOSIS — I5022 Chronic systolic (congestive) heart failure: Secondary | ICD-10-CM | POA: Diagnosis not present

## 2018-04-15 DIAGNOSIS — I472 Ventricular tachycardia, unspecified: Secondary | ICD-10-CM

## 2018-04-15 NOTE — Progress Notes (Signed)
Remote ICD transmission.   

## 2018-05-23 ENCOUNTER — Ambulatory Visit (INDEPENDENT_AMBULATORY_CARE_PROVIDER_SITE_OTHER): Payer: Medicare Other | Admitting: *Deleted

## 2018-05-23 ENCOUNTER — Other Ambulatory Visit: Payer: Self-pay | Admitting: *Deleted

## 2018-05-23 DIAGNOSIS — I359 Nonrheumatic aortic valve disorder, unspecified: Secondary | ICD-10-CM

## 2018-05-23 DIAGNOSIS — Z95828 Presence of other vascular implants and grafts: Secondary | ICD-10-CM

## 2018-05-23 DIAGNOSIS — Z7901 Long term (current) use of anticoagulants: Secondary | ICD-10-CM | POA: Diagnosis not present

## 2018-05-23 DIAGNOSIS — I712 Thoracic aortic aneurysm, without rupture, unspecified: Secondary | ICD-10-CM

## 2018-05-23 DIAGNOSIS — Z5181 Encounter for therapeutic drug level monitoring: Secondary | ICD-10-CM | POA: Diagnosis not present

## 2018-05-23 DIAGNOSIS — Z952 Presence of prosthetic heart valve: Secondary | ICD-10-CM | POA: Diagnosis not present

## 2018-05-23 LAB — POCT INR: INR: 3.8 — AB (ref 2.0–3.0)

## 2018-05-23 NOTE — Patient Instructions (Signed)
Description   Do not take coumadin tomorrow July 13th as he has taken today then continue 1 tablet each Thursday and  1/2 tablet all other days. Recheck in 2 weeks but pt states will come back in 6 weeks and is aware o11f possible complications in not coming back as instructed. Call Coumadin Clinic#[phone] or Main#[phone] with any medication changes.

## 2018-06-05 LAB — CUP PACEART REMOTE DEVICE CHECK
Date Time Interrogation Session: 20190725085616
Implantable Lead Location: 753860
Implantable Pulse Generator Implant Date: 20180831
MDC IDC LEAD IMPLANT DT: 20180831
MDC IDC LEAD SERIAL: 435986
Pulse Gen Serial Number: 240202

## 2018-06-18 ENCOUNTER — Other Ambulatory Visit: Payer: Self-pay | Admitting: Internal Medicine

## 2018-06-18 NOTE — Telephone Encounter (Signed)
This is Dr. Jordan's pt. °

## 2018-06-21 LAB — TSH: TSH: 7.03 u[IU]/mL — ABNORMAL HIGH (ref 0.450–4.500)

## 2018-06-21 LAB — T4, FREE: FREE T4: 1.2 ng/dL (ref 0.82–1.77)

## 2018-06-26 ENCOUNTER — Other Ambulatory Visit: Payer: Self-pay

## 2018-06-26 DIAGNOSIS — Z79899 Other long term (current) drug therapy: Secondary | ICD-10-CM

## 2018-06-26 MED ORDER — LEVOTHYROXINE SODIUM 50 MCG PO TABS: 50.0000 ug | ORAL_TABLET | Freq: Every day | ORAL | Status: DC

## 2018-07-01 ENCOUNTER — Other Ambulatory Visit: Payer: Self-pay

## 2018-07-01 DIAGNOSIS — Z79899 Other long term (current) drug therapy: Secondary | ICD-10-CM

## 2018-07-01 DIAGNOSIS — E039 Hypothyroidism, unspecified: Secondary | ICD-10-CM

## 2018-07-03 ENCOUNTER — Telehealth: Payer: Self-pay | Admitting: Cardiology

## 2018-07-03 ENCOUNTER — Other Ambulatory Visit: Payer: Self-pay

## 2018-07-03 ENCOUNTER — Encounter: Payer: Self-pay | Admitting: Cardiothoracic Surgery

## 2018-07-03 ENCOUNTER — Ambulatory Visit
Admission: RE | Admit: 2018-07-03 | Discharge: 2018-07-03 | Disposition: A | Discharge status: Home / Self Care | Payer: Medicare Other | Source: Ambulatory Visit | Attending: Cardiothoracic Surgery | Admitting: Cardiothoracic Surgery

## 2018-07-03 ENCOUNTER — Ambulatory Visit (INDEPENDENT_AMBULATORY_CARE_PROVIDER_SITE_OTHER): Payer: Medicare Other | Admitting: Cardiothoracic Surgery

## 2018-07-03 VITALS — BP 115/58 | HR 66 | Resp 18 | Ht 70.0 in | Wt 141.6 lb

## 2018-07-03 DIAGNOSIS — I712 Thoracic aortic aneurysm, without rupture, unspecified: Secondary | ICD-10-CM

## 2018-07-03 MED ORDER — IOPAMIDOL (ISOVUE-370) INJECTION 76%
75.0000 mL | Freq: Once | INTRAVENOUS | Status: AC | PRN
  Administered 2018-07-03: 75 mL via INTRAVENOUS

## 2018-07-03 NOTE — Telephone Encounter (Signed)
New Message ° ° °Pt returning call °

## 2018-07-03 NOTE — Progress Notes (Signed)
[address]       [place] [address]             [phone]      [facility] Medical Record #[id number] Date of Birth: 09/19/1946  Referring: [place], Betty G, MD Primary Care: [place], Betty G, MD  Chief Complaint:   POST OP FOLLOW UP 04/29/2017   OPERATIVE REPORT PREOPERATIVE DIAGNOSIS:  Status post mechanical aortic valve replacement in 1983, now with 6.3 to 6.5 cm ascending aortic dilatation with left ventricular dysfunction. POSTOPERATIVE DIAGNOSIS:  Status post mechanical aortic valve replacement in 1983, now with 6.3 to 6.5 cm ascending aortic dilatation with left ventricular dysfunction. SURGICAL PROCEDURES:  Replacement of ascending aorta/Wheat procedure with hypothermic circulatory arrest and cardiopulmonary bypass with a 32 mm Dacron Hemashield woven graft.  History of Present Illness:      Patient is without new complains comes in to review follow up ct a of chest after aortic surgery and evaluation of left lung mass . 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  Notes his Coumadin therapy has remained stable.  Denies pedal edema       Past Medical History:  Diagnosis Date  . AVD (aortic valve disease)   . Chronic anticoagulation   . Dilated cardiomyopathy (HCC)    EF 30-35%  . Dysrhythmia   . Hyperlipidemia   . LV dysfunction      Social History   Tobacco Use  Smoking Status Former Smoker  . Last attempt to quit: 11/12/2000  . Years since quitting: 17.6  Smokeless Tobacco Never Used    Social History   Substance and Sexual Activity  Alcohol Use No     Allergies  Allergen Reactions  . Morphine And Related Other (See Comments)    Hallucinations     Current Outpatient Medications  Medication Sig Dispense Refill  . amiodarone (PACERONE) 200 MG tablet Take 200 mg daily except none on Sundays 90 tablet 3  . levothyroxine (SYNTHROID, LEVOTHROID) 50 MCG tablet Take  1 tablet (50 mcg total) by mouth daily before breakfast.    . losartan (COZAAR) 25 MG tablet Take 0.5 tablets (12.5 mg total) by mouth daily. 45 tablet 3  . warfarin (COUMADIN) 5 MG tablet Take as directed by coumadin clinic     No current facility-administered medications for this visit.        Physical Exam: BP (!) 115/58 (BP Location: Right Arm, Patient Position: Sitting, Cuff Size: Normal)   Pulse 66   Resp 18   Ht 5\' 10"  (1.778 m)   Wt 141 lb 9.6 oz (64.2 kg)   SpO2 96% Comment: RA  BMI 20.32 kg/m  General appearance: alert, cooperative and appears stated age Head: Normocephalic, without obvious abnormality, atraumatic Neck: no adenopathy, no carotid bruit, no JVD, supple, symmetrical, trachea midline and thyroid not enlarged, symmetric, no tenderness/mass/nodules Lymph nodes: Cervical, supraclavicular, and axillary nodes normal. Resp: clear to auscultation bilaterally Back: symmetric, no curvature. ROM normal. No CVA tenderness. Cardio: regular rate and rhythm, S1, S2 normal, click of mechanical valves easily audible crisp rub or gallop GI: soft, non-tender; bowel sounds normal; no masses,  no organomegaly Extremities: extremities normal, atraumatic, no cyanosis or edema and Homans sign is negative, no sign of DVT, patient has extensive bilateral venous varicosities but without ulceration Neurologic: Grossly normal   Diagnostic Studies & Laboratory data:     Recent Radiology Findings:  Ct  Angio Chest Aorta W/cm &/or Wo/cm  Result Date: 07/03/2018 CLINICAL DATA:  Thoracic aortic aneurysm EXAM: CT ANGIOGRAPHY CHEST WITH CONTRAST TECHNIQUE: Multidetector CT imaging of the chest was performed using the standard protocol during bolus administration of intravenous contrast. Multiplanar CT image reconstructions and MIPs were obtained to evaluate the vascular anatomy. CONTRAST:  75mL ISOVUE-370 IOPAMIDOL (ISOVUE-370) INJECTION 76% COMPARISON:  10/31/2017 FINDINGS: Cardiovascular:  Postoperative changes from aortic valve and root replacement are again noted. Maximal diameter of the ascending aorta is 3.4 cm. Atherosclerotic calcifications in the aortic arch are noted. Maximal diameter of the proximal aortic arch is 3.4 cm. Great vessels are grossly patent. There is no evidence of pseudoaneurysm. Three vessel coronary artery calcifications. AICD device remains in place. Mediastinum/Nodes: No abnormal mediastinal adenopathy. No pericardial effusion. Thyroid remains heterogeneous. Esophagus is within normal limits. Lungs/Pleura: Central fat containing left lung mass is stable at 3.2 cm. No pneumothorax or pleural effusion. Scattered scarring in the lungs is stable. Calcifications in the lateral basal segment of the left lower lobe are likely related to old granulomatous disease. Upper Abdomen: No acute abnormality. Musculoskeletal: No vertebral compression deformity. Review of the MIP images confirms the above findings. IMPRESSION: Stable appearance of aortic valve and ascending aortic graft repair without evidence of pseudoaneurysm or contrast extravasation. Stable central left lung hamartoma. Aortic Atherosclerosis (ICD10-I70.0). Electronically Signed   By: Jolaine Click M.D.   On: 07/03/2018 09:49   I have independently reviewed the above radiology studies  and reviewed the findings with the patient.     Ct Angio Chest Aorta W/cm &/or Wo/cm  Result Date: 11/01/2017 CLINICAL DATA:  Status post Wheat procedure on 04/30/2017 to treat aneurysmal disease of the ascending thoracic aorta. EXAM: CT ANGIOGRAPHY CHEST WITH CONTRAST TECHNIQUE: Multidetector CT imaging of the chest was performed using the standard protocol during bolus administration of intravenous contrast. Multiplanar CT image reconstructions and MIPs were obtained to evaluate the vascular anatomy. CONTRAST:  75mL ISOVUE-370 IOPAMIDOL (ISOVUE-370) INJECTION 76% COMPARISON:  02/01/2017, post- operative CT of the chest without  contrast on 05/07/2017 and chest x-ray on 07/13/2017 FINDINGS: Cardiovascular: Initial unenhanced CT demonstrates high attenuation supracoronary synthetic aortic graft extending from the aortic root to the proximal arch. Arterial phase post-contrast imaging shows normal patency of the graft with normal appearance of proximal and distal anastomoses and no evidence of pseudoaneurysm or residual aneurysmal disease. The aortic root measures approximately 3.6 cm. The graft measures approximately 3.4 cm. Native arch measures approximately 3.2 cm. The descending thoracic aorta measures approximately 2.4 cm. No evidence of aortic dissection. Proximal great vessels show stable mild atherosclerosis without stenosis. Stable normal variant separate origin of the left vertebral artery off of the aortic arch. Stable CT appearance of mechanical aortic valve. Stable heart size without evidence of pericardial fluid. Calcified plaque again noted in the coronary arteries. Single lead ICD extends to the right ventricular apex. Normal caliber central pulmonary arteries. Mediastinum/Nodes: No mediastinal, hilar or axillary lymphadenopathy. No pneumomediastinum or mediastinal fluid collections. Central airways are normally patent. Lungs/Pleura: Lung showed no evidence of pneumothorax, edema or airspace consolidation. Minimal scarring/atelectasis present at the lung bases bilaterally. No pleural effusions. Stable fat containing central lung mass in the left superior hilar region measuring 3.2 cm in greatest diameter and consistent with hamartoma. Upper Abdomen: Stable upper pole left renal cyst has a benign appearance. 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 No abnormalities identified by CT at the level of median sternotomy. Review of the MIP images  confirms the above findings. IMPRESSION: 1. Postoperative appearance following supracoronary aortic graft placement to treat aneurysmal disease of the  ascending thoracic aorta. The graft appears normally patent with no evidence of residual aneurysmal disease. Previously placed mechanical aortic valve again noted. No evidence of aortic dissection, graft pseudoaneurysm or abnormal mediastinal fluid or air. 2. 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 Electronically Signed   By: Irish Lack M.D.   On: 11/01/2017 09:34      Recent Lab Findings: Lab Results  Component Value Date   WBC 7.6 03/18/2018   HGB 13.3 03/18/2018   HCT 40.3 03/18/2018   PLT 264 03/18/2018   GLUCOSE 121 (H) 03/18/2018   CHOL 171 09/16/2017   TRIG 63.0 09/16/2017   HDL 55.00 09/16/2017   LDLCALC 103 (H) 09/16/2017   ALT 8 03/18/2018   AST 22 03/18/2018   NA 135 03/18/2018   K 4.9 03/18/2018   CL 95 (L) 03/18/2018   CREATININE 1.45 (H) 03/18/2018   BUN 20 03/18/2018   CO2 24 03/18/2018   TSH 7.030 (H) 06/20/2018   INR 3.8 (A) 05/23/2018   HGBA1C 5.7 (H) 04/25/2017      Assessment / Plan:   1/s/p replacement of ascending aorta for 6.4 ascending aortic aneurysm s/p mechanical AVR 32 years ago.The graft appears normally patent with no evidence of residual aneurysmal disease 2/table 3.2 cm fat containing hamartoma unchanged since previous scan 8 months ago.  Follow up cta of chest to evaluate "hamartoma" and aortic repair  In 12 months  [name] MD      [address] [place] [place] [address] Office [phone]   Beeper [phone]  07/03/2018 10:10 AM

## 2018-07-03 NOTE — Telephone Encounter (Signed)
Returned call to patient lab results given.6 month follow up scheduled 09/05/18 at 10:40 am.

## 2018-07-04 ENCOUNTER — Ambulatory Visit (INDEPENDENT_AMBULATORY_CARE_PROVIDER_SITE_OTHER): Payer: Medicare Other

## 2018-07-04 DIAGNOSIS — Z952 Presence of prosthetic heart valve: Secondary | ICD-10-CM

## 2018-07-04 DIAGNOSIS — I359 Nonrheumatic aortic valve disorder, unspecified: Secondary | ICD-10-CM

## 2018-07-04 DIAGNOSIS — Z7901 Long term (current) use of anticoagulants: Secondary | ICD-10-CM

## 2018-07-04 DIAGNOSIS — Z5181 Encounter for therapeutic drug level monitoring: Secondary | ICD-10-CM

## 2018-07-04 DIAGNOSIS — Z95828 Presence of other vascular implants and grafts: Secondary | ICD-10-CM | POA: Diagnosis not present

## 2018-07-04 LAB — POCT INR: INR: 1.9 — AB (ref 2.0–3.0)

## 2018-07-04 NOTE — Patient Instructions (Signed)
Description   Take 1 tablet today and tomorrow, then resume same dosage 1/2 tablet daily except 1 tablet on Thursdays.  Recheck in 3 weeks but pt states will come back in 6 weeks and is aware of possible complications in not coming back as instructed. Call Coumadin Clinic#[phone] or Main#[phone] with any medication changes.

## 2018-07-15 ENCOUNTER — Ambulatory Visit (INDEPENDENT_AMBULATORY_CARE_PROVIDER_SITE_OTHER): Payer: Medicare Other | Admitting: *Deleted

## 2018-07-15 DIAGNOSIS — I472 Ventricular tachycardia, unspecified: Secondary | ICD-10-CM

## 2018-07-15 DIAGNOSIS — I5023 Acute on chronic systolic (congestive) heart failure: Secondary | ICD-10-CM

## 2018-07-15 NOTE — Progress Notes (Signed)
Remote ICD transmission.   

## 2018-08-01 ENCOUNTER — Other Ambulatory Visit: Payer: Self-pay | Admitting: Cardiology

## 2018-08-05 LAB — CUP PACEART REMOTE DEVICE CHECK
Date Time Interrogation Session: 20190924084241
Implantable Lead Implant Date: 20180831
Implantable Lead Model: 293
Implantable Lead Serial Number: 435986
MDC IDC LEAD LOCATION: 753860
MDC IDC PG IMPLANT DT: 20180831
MDC IDC PG SERIAL: 240202

## 2018-08-15 ENCOUNTER — Ambulatory Visit (INDEPENDENT_AMBULATORY_CARE_PROVIDER_SITE_OTHER): Payer: Medicare Other | Admitting: *Deleted

## 2018-08-15 DIAGNOSIS — I359 Nonrheumatic aortic valve disorder, unspecified: Secondary | ICD-10-CM

## 2018-08-15 DIAGNOSIS — Z95828 Presence of other vascular implants and grafts: Secondary | ICD-10-CM | POA: Diagnosis not present

## 2018-08-15 DIAGNOSIS — Z952 Presence of prosthetic heart valve: Secondary | ICD-10-CM | POA: Diagnosis not present

## 2018-08-15 DIAGNOSIS — Z5181 Encounter for therapeutic drug level monitoring: Secondary | ICD-10-CM

## 2018-08-15 DIAGNOSIS — Z7901 Long term (current) use of anticoagulants: Secondary | ICD-10-CM | POA: Diagnosis not present

## 2018-08-15 LAB — POCT INR: INR: 4.3 — AB (ref 2.0–3.0)

## 2018-08-15 NOTE — Patient Instructions (Signed)
Description   Hold tomorrow's dose then resume same dosage 1/2 tablet daily except 1 tablet on Thursdays.  Recheck in 3 weeks but pt states will come back in 6 weeks and is aware of possible complications in not coming back as instructed. Call Coumadin Clinic#[phone] or Main#[phone] with any medication changes.

## 2018-08-22 ENCOUNTER — Encounter: Payer: Self-pay | Admitting: Internal Medicine

## 2018-08-22 ENCOUNTER — Ambulatory Visit (INDEPENDENT_AMBULATORY_CARE_PROVIDER_SITE_OTHER): Payer: Medicare Other | Admitting: Internal Medicine

## 2018-08-22 VITALS — BP 138/76 | HR 71 | Ht 70.0 in | Wt 141.6 lb

## 2018-08-22 DIAGNOSIS — Z95810 Presence of automatic (implantable) cardiac defibrillator: Secondary | ICD-10-CM | POA: Diagnosis not present

## 2018-08-22 DIAGNOSIS — I472 Ventricular tachycardia, unspecified: Secondary | ICD-10-CM

## 2018-08-22 DIAGNOSIS — I10 Essential (primary) hypertension: Secondary | ICD-10-CM | POA: Diagnosis not present

## 2018-08-22 DIAGNOSIS — I5022 Chronic systolic (congestive) heart failure: Secondary | ICD-10-CM | POA: Diagnosis not present

## 2018-08-22 LAB — CUP PACEART INCLINIC DEVICE CHECK
HighPow Impedance: 57 Ohm
Implantable Lead Location: 753860
Lead Channel Pacing Threshold Pulse Width: 0.4 ms
Lead Channel Setting Pacing Amplitude: 2.5 V
Lead Channel Setting Pacing Pulse Width: 0.4 ms
Lead Channel Setting Sensing Sensitivity: 0.5 mV
MDC IDC LEAD IMPLANT DT: 20180831
MDC IDC LEAD SERIAL: 435986
MDC IDC MSMT LEADCHNL RV IMPEDANCE VALUE: 469 Ohm
MDC IDC MSMT LEADCHNL RV PACING THRESHOLD AMPLITUDE: 1 V
MDC IDC MSMT LEADCHNL RV SENSING INTR AMPL: 14.6 mV
MDC IDC PG IMPLANT DT: 20180831
MDC IDC SESS DTM: 20191011040000
Pulse Gen Serial Number: 240202

## 2018-08-22 MED ORDER — AMIODARONE HCL 200 MG PO TABS: ORAL_TABLET | ORAL | 3 refills | Status: DC

## 2018-08-22 NOTE — Progress Notes (Signed)
HPI [name] returns today for ongoing evaluation and follow-up of his ICD.  He is a very pleasant 72 year old man with a history of chronic systolic heart failure, aortic root disease status post replacement, ventricular tachycardia, and frequent PVCs.  The patient was hospitalized for many weeks after aortic root replacement, and had an extensive hospital course complicated by ventricular tachycardia and subcutaneous emphysema, respiratory failure, and underwent ICD insertion approximately 3 months ago.  In the interim he has done well.  His dyspnea has improved. No syncope. Allergies  Allergen Reactions  . Morphine And Related Other (See Comments)    Hallucinations      Current Outpatient Medications  Medication Sig Dispense Refill  . levothyroxine (SYNTHROID, LEVOTHROID) 50 MCG tablet Take 1 tablet (50 mcg total) by mouth daily before breakfast.    . losartan (COZAAR) 25 MG tablet TAKE ONE-HALF TABLET (12.5 MG TOTAL) BY MOUTH DAILY 45 tablet 2  . warfarin (COUMADIN) 5 MG tablet Take as directed by coumadin clinic    . amiodarone (PACERONE) 200 MG tablet Take one tablet by mouth daily Monday through Friday.  DO NOT take on Saturday or Sunday. 90 tablet 3   No current facility-administered medications for this visit.      Past Medical History:  Diagnosis Date  . AVD (aortic valve disease)   . Chronic anticoagulation   . Dilated cardiomyopathy (HCC)    EF 30-35%  . Dysrhythmia   . Hyperlipidemia   . LV dysfunction     ROS:   All systems reviewed and negative except as noted in the HPI.   Past Surgical History:  Procedure Laterality Date  . AORTIC VALVE REPLACEMENT    . BENTALL PROCEDURE N/A 04/29/2017   Procedure: REPLACEMENT ASCENDING AORTA/ WHEAT PROCEDURE, HYPOTHERMIC CIRCULATORY ARREST AND CARDIOPULMONARY BYPASS, AND USE OF HEMASHIELD PLATINUM WOVEN DOUBLE VELOUR VASCULAR GRAFT 32 MM X 50 CM;  Surgeon: [name], MD;  Location: [facility] OR;  Service: Open  Heart Surgery;  Laterality: N/A;  . DOPPLER ECHOCARDIOGRAPHY  04/07/2002   EF 30-35%  . ICD IMPLANT N/A 07/12/2017   Procedure: ICD Implant;  Surgeon: Marinus Maw, MD;  Location: [facility] INVASIVE CV LAB;  Service: Cardiovascular;  Laterality: N/A;  . RIGHT/LEFT HEART CATH AND CORONARY ANGIOGRAPHY N/A 03/05/2017   Procedure: Right/Left Heart Cath and Coronary Angiography;  Surgeon: Peter M Swaziland, MD;  Location: [facility] INVASIVE CV LAB;  Service: Cardiovascular;  Laterality: N/A;  . TEE WITHOUT CARDIOVERSION N/A 04/29/2017   Procedure: TRANSESOPHAGEAL ECHOCARDIOGRAM (TEE);  Surgeon: [name], MD;  Location: [facility] OR;  Service: Open Heart Surgery;  Laterality: N/A;     Family History  Problem Relation Age of Onset  . Heart disease Brother      Social History   Socioeconomic History  . Marital status: Married    Spouse name: Not on file  . Number of children: Not on file  . Years of education: Not on file  . Highest education level: Not on file  Occupational History  . Not on file  Social Needs  . Financial resource strain: Not on file  . Food insecurity:    Worry: Not on file    Inability: Not on file  . Transportation needs:    Medical: Not on file    Non-medical: Not on file  Tobacco Use  . Smoking status: Former Smoker    Last attempt to quit: 11/12/2000    Years since quitting: 17.7  .  Smokeless tobacco: Never Used  Substance and Sexual Activity  . Alcohol use: No  . Drug use: No  . Sexual activity: Yes  Lifestyle  . Physical activity:    Days per week: Not on file    Minutes per session: Not on file  . Stress: Not at all  Relationships  . Social connections:    Talks on phone: More than three times a week    Gets together: More than three times a week    Attends religious service: Not on file    Active member of club or organization: Not on file    Attends meetings of clubs or organizations: Never    Relationship status: Married  . Intimate partner violence:     Fear of current or ex partner: Not on file    Emotionally abused: Not on file    Physically abused: Not on file    Forced sexual activity: Not on file  Other Topics Concern  . Not on file  Social History Narrative  . Not on file     BP 138/76   Pulse 71   Ht 5\' 10"  (1.778 m)   Wt 141 lb 9.6 oz (64.2 kg)   SpO2 98%   BMI 20.32 kg/m   Physical Exam:  Well appearing NAD HEENT: Unremarkable Neck:  No JVD, no thyromegally Lymphatics:  No adenopathy Back:  No CVA tenderness Lungs:  Clear with no wheezes HEART:  Regular rate rhythm, no murmurs, no rubs, no clicks Abd:  soft, positive bowel sounds, no organomegally, no rebound, no guarding Ext:  2 plus pulses, no edema, no cyanosis, no clubbing Skin:  No rashes no nodules Neuro:  CN II through XII intact, motor grossly intact  EKG - nsr with LBBB  DEVICE  Normal device function.  See PaceArt for details.   Assess/Plan: 1. VT - he is maintaining NSR. He will reduce his dose of amio to 200 mg daily on Monday thru Friday. 2. Chronic systolic heart failure - his symptoms are improved, class 2.  3. ICD - his [employer] device is working normally. No recent episodes of VT.  Leonia Reeves.D

## 2018-08-22 NOTE — Patient Instructions (Addendum)
Medication Instructions:  Your physician has recommended you make the following change in your medication:  1.  Reduce your amiodarone 200 mg--  Take one tablet by mouth Monday through Friday.  DO NOT take on Saturday or Sunday.  Labwork: None ordered.  Testing/Procedures: None ordered.  Follow-Up: Your physician wants you to follow-up in: one year with Dr. Ladona Ridgel.   You will receive a reminder letter in the mail two months in advance. If you don't receive a letter, please call our office to schedule the follow-up appointment.  Remote monitoring is used to monitor your ICD from home. This monitoring reduces the number of office visits required to check your device to one time per year. It allows [place] to keep an eye on the functioning of your device to ensure it is working properly. You are scheduled for a device check from home on 10/14/2018. You may send your transmission at any time that day. If you have a wireless device, the transmission will be sent automatically. After your physician reviews your transmission, you will receive a postcard with your next transmission date.  Any Other Special Instructions Will Be Listed Below (If Applicable).  If you need a refill on your cardiac medications before your next appointment, please call your pharmacy.

## 2018-08-28 ENCOUNTER — Other Ambulatory Visit: Payer: Self-pay | Admitting: Internal Medicine

## 2018-09-04 NOTE — Progress Notes (Signed)
Jack Joseph Date of Birth: December 12, 1945   History of Present Illness: Jack Joseph is seen today for post hospital follow up.  He is status post aortic valve replacement with a Bjork Shiley valve in [place] in [place]. He is on chronic Coumadin therapy. He has a history of dilated cardiomyopathy with prior ejection fraction of 30-35%.   When seen earlier this year CXR showed a left hilar rounded mass and CT was recommended. 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 An Echo was also obtained showing normal functioning of his prosthetic AV. It confirmed the ascending aortic aneurysm. LV function was severely reduced with EF 25-30%. There was mild pulmonary HTN. Cardiac cath showed mild nonobstructive CAD with normal right heart pressures and cardiac output. The prosthetic AV disc moved well on fluoro.   On 04/29/17 he underwent a Wheat procedure with replacement of his ascending aorta by Dr. Tyrone Sage. The prior AV prosthesis was left in place. His post op course was complicated. He was initially extubated on the evening of surgery and inotropes were weaned. On the next day he developed pulseless VT with cardiac arrest. He required cardioversion, CPR, and reintubation. He was loaded on IV amiodarone. He developed a pneumothorax and had a chest tube placed. On POD #3 he was once again extubated. He developed subcutaneous emphysema on the right and had a second chest tube placed. He required reintubation again due to mucus plugging. On POD#6 he was extubated again. On POD #8 his subcutaneous emphysema worsened and his right chest tube had to be replaced. He required transfusion on 2 occasions for anemia. On POD #8 he had increased atelectasis. He had bronchoscopy. Breathing again worsened and he was reintubated. He required TPN for nutritional support and IV pressors for BP support. He improved and was able to be  extubated on POD #13 and chest tubes removed on POD #16. Diet was advanced and he was transitioned to coumadin. He also developed diarrhea and was C. Diff positive. This was treated. On July 12 he was transferred to inpatient Rehab for PT/OT. He continued to have SOB and hypoxia treated with lasix and Xopenex. This improved and he was weaned off oxygen. He was DC home on July 20 on coumadin, amiodarone, and losartan. Lifevest was ordered. Echo on July 9 showed EF 20-25%. Dr. Ladona Ridgel recommended consideration for ICD implant and this was done on 07/12/17. Amiodarone dose decreased to 200 mg daily except none on Sunday. Seen in follow up with EP in December and recommended continued current therapy.   Last ICD check on October 11 showed no new episodes of VT. Dr. Ladona Ridgel reduced amiodarone to 200 mg 5 days/week.    On follow up today he is is doing OK. 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  He denies any chest pain or dyspnea.  Has chronic numbness in his left arm. Weight has stabilized.  No symptomatic VT.    No ICD discharges.  Still tires easily but more when he is home than at work.    Current Outpatient Medications on File Prior to Visit  Medication Sig Dispense Refill  . amiodarone (PACERONE) 200 MG tablet Take one tablet by mouth daily Monday through Friday.  DO NOT take on Saturday or Sunday. 90 tablet 3  . levothyroxine (SYNTHROID, LEVOTHROID) 50 MCG tablet Take 1 tablet (50 mcg total) by mouth daily before breakfast.    .  losartan (COZAAR) 25 MG tablet TAKE ONE-HALF TABLET (12.5 MG TOTAL) BY MOUTH DAILY 45 tablet 2  . spironolactone (ALDACTONE) 25 MG tablet Take 12.5 mg by mouth daily.    Marland Kitchen warfarin (COUMADIN) 5 MG tablet Take as directed by coumadin clinic     No current facility-administered medications on file prior to visit.     Allergies  Allergen Reactions  . Morphine And Related Other (See Comments)    Hallucinations     Past Medical History:  Diagnosis Date  .  AVD (aortic valve disease)   . Chronic anticoagulation   . Dilated cardiomyopathy (HCC)    EF 30-35%  . Dysrhythmia   . Hyperlipidemia   . LV dysfunction     Past Surgical History:  Procedure Laterality Date  . AORTIC VALVE REPLACEMENT    . BENTALL PROCEDURE N/A 04/29/2017   Procedure: REPLACEMENT ASCENDING AORTA/ WHEAT PROCEDURE, HYPOTHERMIC CIRCULATORY ARREST AND CARDIOPULMONARY BYPASS, AND USE OF HEMASHIELD PLATINUM WOVEN DOUBLE VELOUR VASCULAR GRAFT 32 MM X 50 CM;  Surgeon: [name], MD;  Location: [facility] OR;  Service: Open Heart Surgery;  Laterality: N/A;  . DOPPLER ECHOCARDIOGRAPHY  04/07/2002   EF 30-35%  . ICD IMPLANT N/A 07/12/2017   Procedure: ICD Implant;  Surgeon: Marinus Maw, MD;  Location: [facility] INVASIVE CV LAB;  Service: Cardiovascular;  Laterality: N/A;  . RIGHT/LEFT HEART CATH AND CORONARY ANGIOGRAPHY N/A 03/05/2017   Procedure: Right/Left Heart Cath and Coronary Angiography;  Surgeon: Tammye Kahler M Swaziland, MD;  Location: [facility] INVASIVE CV LAB;  Service: Cardiovascular;  Laterality: N/A;  . TEE WITHOUT CARDIOVERSION N/A 04/29/2017   Procedure: TRANSESOPHAGEAL ECHOCARDIOGRAM (TEE);  Surgeon: [name], MD;  Location: [facility] OR;  Service: Open Heart Surgery;  Laterality: N/A;    Social History   Tobacco Use  Smoking Status Former Smoker  . Last attempt to quit: 11/12/2000  . Years since quitting: 17.8  Smokeless Tobacco Never Used    Social History   Substance and Sexual Activity  Alcohol Use No    Family History  Problem Relation Age of Onset  . Heart disease Brother     Review of Systems: As noted in history of present illness.  All other systems were reviewed and are negative.  Physical Exam: BP 110/68   Pulse 67   Ht 5\' 10"  (1.778 m)   Wt 145 lb (65.8 kg)   BMI 20.81 kg/m  GENERAL:  Well appearing, thin WM in NAD HEENT:  PERRL, EOMI, sclera are clear. Oropharynx is clear. NECK:  No jugular venous distention, carotid upstroke brisk and symmetric,  no bruits, no thyromegaly or adenopathy LUNGS:  Clear to auscultation bilaterally CHEST:  Unremarkable HEART:  RRR,  PMI not displaced or sustained,good mechanical AV click. no S3, no S4: no clicks, no rubs, no murmurs ABD:  Soft, nontender. BS +, no masses or bruits. No hepatomegaly, no splenomegaly EXT:  2 + pulses throughout, no  ankle edema, no cyanosis no clubbing. Hand is mildly swollen and tender where splinter removed. SKIN:  Warm and dry.  No rashes NEURO:  Alert and oriented x 3. Cranial nerves II through XII intact. PSYCH:  Cognitively intact     LABORATORY DATA:  Lab Results  Component Value Date   WBC 7.6 03/18/2018   HGB 13.3 03/18/2018   HCT 40.3 03/18/2018   PLT 264 03/18/2018   GLUCOSE 121 (H) 03/18/2018   CHOL 171 09/16/2017   TRIG 63.0 09/16/2017   HDL 55.00 09/16/2017  LDLCALC 103 (H) 09/16/2017   ALT 8 03/18/2018   AST 22 03/18/2018   NA 135 03/18/2018   K 4.9 03/18/2018   CL 95 (L) 03/18/2018   CREATININE 1.45 (H) 03/18/2018   BUN 20 03/18/2018   CO2 24 03/18/2018   TSH 7.030 (H) 06/20/2018   PSA 0.83 09/16/2017   INR 4.3 (A) 08/15/2018   HGBA1C 5.7 (H) 04/25/2017     Echo 05/20/17:Study Conclusions  - Procedure narrative: Transthoracic echocardiography. Image   quality was poor. The study was technically difficult, as a   result of poor sound wave transmission. - Left ventricle: The cavity size was mildly dilated. Wall   thickness was normal. Systolic function was severely reduced. The   estimated ejection fraction was in the range of 20% to 25%.   Diffuse hypokinesis. Features are consistent with a pseudonormal   left ventricular filling pattern, with concomitant abnormal   relaxation and increased filling pressure (grade 2 diastolic   dysfunction). - Aortic valve: A mechanical prosthesis was present. - Mitral valve: There was mild regurgitation. - Pericardium, extracardiac: There was a left pleural effusion.  Impressions:  - Severe  global reduction in LV systolic function; moderate   diastolic dysfunction; mild LVE; s/p AVR with normal mean   gradient; mildly dilated aortic root; mild MR.   Assessment / Plan: 1. Remote Status post aortic valve replacement with a Bjork Shiley mechanical prosthesis. He is on chronic Coumadin therapy.  Last INR 4.3. Good click on exam. Good valve function by Echo. Will have pharmacy recheck INR today. Note recent decrease in amiodarone dose.  SBE prophylaxis.   2. Dilated cardiomyopathy. Ejection fraction of 20-25% post Aortic aneurysm repair.  Patient is class 1-2. Only on losartan and aldactone at low doses. Unable to titrate due to low BP.   3. Thoracic aortic aneurysm. Ascending aorta. 6.3 cm.  Now s/p Wheat procedure with replacement. 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 Post op course very complicated due to development of VT, acute respiratory failure, subQ emphysema, multiple chest tubes, anemia, and deconditioning. He clinically is improved.  4. VT following Aortic aneurysm surgery. Low EF. S/p ICD implant. Continue amiodarone 5 days/week. Need to check TFTS and chemistries today.   5. Abdominal bruit. [place] negative for AAA.  Borderline iliac disease.  6. Hypothyroidism. Thyroid dose increased in August. On 50 micrograms. Will follow up TSH and free T4 today.  Follow up in 6 months.

## 2018-09-05 ENCOUNTER — Ambulatory Visit (INDEPENDENT_AMBULATORY_CARE_PROVIDER_SITE_OTHER): Payer: Medicare Other | Admitting: Pharmacist

## 2018-09-05 ENCOUNTER — Encounter: Payer: Self-pay | Admitting: Cardiology

## 2018-09-05 ENCOUNTER — Telehealth: Payer: Self-pay

## 2018-09-05 ENCOUNTER — Ambulatory Visit (INDEPENDENT_AMBULATORY_CARE_PROVIDER_SITE_OTHER): Payer: Medicare Other | Admitting: Cardiology

## 2018-09-05 ENCOUNTER — Ambulatory Visit: Payer: Medicare Other | Admitting: Cardiology

## 2018-09-05 VITALS — BP 110/68 | HR 67 | Ht 70.0 in | Wt 145.0 lb

## 2018-09-05 DIAGNOSIS — Z95828 Presence of other vascular implants and grafts: Secondary | ICD-10-CM

## 2018-09-05 DIAGNOSIS — Z7901 Long term (current) use of anticoagulants: Secondary | ICD-10-CM | POA: Diagnosis not present

## 2018-09-05 DIAGNOSIS — I359 Nonrheumatic aortic valve disorder, unspecified: Secondary | ICD-10-CM

## 2018-09-05 DIAGNOSIS — I472 Ventricular tachycardia, unspecified: Secondary | ICD-10-CM

## 2018-09-05 DIAGNOSIS — Z952 Presence of prosthetic heart valve: Secondary | ICD-10-CM

## 2018-09-05 DIAGNOSIS — I5022 Chronic systolic (congestive) heart failure: Secondary | ICD-10-CM

## 2018-09-05 DIAGNOSIS — Z95810 Presence of automatic (implantable) cardiac defibrillator: Secondary | ICD-10-CM

## 2018-09-05 DIAGNOSIS — Z5181 Encounter for therapeutic drug level monitoring: Secondary | ICD-10-CM | POA: Diagnosis not present

## 2018-09-05 LAB — T4, FREE: Free T4: 1.23 ng/dL (ref 0.82–1.77)

## 2018-09-05 LAB — BASIC METABOLIC PANEL
BUN / CREAT RATIO: 13 (ref 10–24)
BUN: 21 mg/dL (ref 8–27)
CO2: 24 mmol/L (ref 20–29)
Calcium: 9.6 mg/dL (ref 8.6–10.2)
Chloride: 95 mmol/L — ABNORMAL LOW (ref 96–106)
Creatinine, Ser: 1.68 mg/dL — ABNORMAL HIGH (ref 0.76–1.27)
GFR, EST AFRICAN AMERICAN: 46 mL/min/{1.73_m2} — AB (ref >59)
GFR, EST NON AFRICAN AMERICAN: 40 mL/min/{1.73_m2} — AB (ref >59)
GLUCOSE: 78 mg/dL (ref 65–99)
Potassium: 4.8 mmol/L (ref 3.5–5.2)
SODIUM: 134 mmol/L (ref 134–144)

## 2018-09-05 LAB — TSH: TSH: 5.64 u[IU]/mL — ABNORMAL HIGH (ref 0.450–4.500)

## 2018-09-05 LAB — LIPID PANEL W/O CHOL/HDL RATIO
Cholesterol, Total: 192 mg/dL (ref 100–199)
HDL: 71 mg/dL (ref >39)
LDL Calculated: 109 mg/dL — ABNORMAL HIGH (ref 0–99)
Triglycerides: 62 mg/dL (ref 0–149)
VLDL CHOLESTEROL CAL: 12 mg/dL (ref 5–40)

## 2018-09-05 LAB — PT AND PTT: APTT: 76 s — AB (ref 24–33)

## 2018-09-05 LAB — HEPATIC FUNCTION PANEL
ALBUMIN: 4.4 g/dL (ref 3.5–4.8)
ALT: 12 IU/L (ref 0–44)
AST: 25 IU/L (ref 0–40)
Alkaline Phosphatase: 63 IU/L (ref 39–117)
BILIRUBIN TOTAL: 0.5 mg/dL (ref 0.0–1.2)
BILIRUBIN, DIRECT: 0.13 mg/dL (ref 0.00–0.40)
TOTAL PROTEIN: 7.1 g/dL (ref 6.0–8.5)

## 2018-09-05 MED ORDER — PHYTONADIONE 5 MG PO TABS: 5.0000 mg | ORAL_TABLET | Freq: Once | ORAL | 0 refills | Status: DC

## 2018-09-05 MED ORDER — PHYTONADIONE 5 MG PO TABS: 5.0000 mg | ORAL_TABLET | Freq: Once | ORAL | 0 refills | Status: AC

## 2018-09-05 NOTE — Telephone Encounter (Signed)
Received a call for a critical lab value. INR-Unable to calculate PT- no clot AP/TT-76. Pt advised Per Dr. Swaziland and Pharm D he will need to report to ED as he has an increased risk of bleeding. Pt states he does not want to go to the ED. Per Dr. Swaziland pt can take a 1 time does of Vitamin K 5 mg. Pt states he would like for [place] to call him back in 20 mins. He reports he is on the road and will like to thinking about it while at home. Will route to Pharm D.

## 2018-09-05 NOTE — Addendum Note (Signed)
Addended by: Neoma Laming on: 09/05/2018 11:27 AM   Modules accepted: Orders

## 2018-09-05 NOTE — Telephone Encounter (Signed)
VITAMIN K 5MG  RX sent to [facility] (medication  and discount available).   Patient agreed on taking vitamin K 5mg  today, hold warfarin and repeat INR on Monday Dec/28

## 2018-09-08 ENCOUNTER — Other Ambulatory Visit: Payer: Self-pay

## 2018-09-08 ENCOUNTER — Ambulatory Visit (INDEPENDENT_AMBULATORY_CARE_PROVIDER_SITE_OTHER): Payer: Medicare Other | Admitting: Pharmacist

## 2018-09-08 DIAGNOSIS — Z952 Presence of prosthetic heart valve: Secondary | ICD-10-CM

## 2018-09-08 DIAGNOSIS — E039 Hypothyroidism, unspecified: Secondary | ICD-10-CM

## 2018-09-08 DIAGNOSIS — Z95828 Presence of other vascular implants and grafts: Secondary | ICD-10-CM

## 2018-09-08 DIAGNOSIS — Z5181 Encounter for therapeutic drug level monitoring: Secondary | ICD-10-CM

## 2018-09-08 LAB — POCT INR: INR: 5.9 — AB (ref 2.0–3.0)

## 2018-09-08 MED ORDER — LEVOTHYROXINE SODIUM 50 MCG PO TABS: ORAL_TABLET | ORAL | 6 refills | Status: DC

## 2018-09-17 ENCOUNTER — Encounter: Payer: Medicare Other | Admitting: Family Medicine

## 2018-09-19 ENCOUNTER — Ambulatory Visit (INDEPENDENT_AMBULATORY_CARE_PROVIDER_SITE_OTHER): Payer: Medicare Other | Admitting: Pharmacist

## 2018-09-19 DIAGNOSIS — Z95828 Presence of other vascular implants and grafts: Secondary | ICD-10-CM

## 2018-09-19 DIAGNOSIS — Z952 Presence of prosthetic heart valve: Secondary | ICD-10-CM

## 2018-09-19 DIAGNOSIS — Z7901 Long term (current) use of anticoagulants: Secondary | ICD-10-CM | POA: Diagnosis not present

## 2018-09-19 DIAGNOSIS — Z5181 Encounter for therapeutic drug level monitoring: Secondary | ICD-10-CM

## 2018-09-19 DIAGNOSIS — I359 Nonrheumatic aortic valve disorder, unspecified: Secondary | ICD-10-CM

## 2018-09-19 LAB — POCT INR: INR: 2 (ref 2.0–3.0)

## 2018-09-19 NOTE — Patient Instructions (Signed)
Description   Take 1 tablet today and tomorrow, then resume at 1/2 tablet daily. We recommend coming in to recheck your INR in 1 week. In not coming back in the timeframe we recommend, you are assuming the liability of being at increased risk for having a stroke, blood clot, or bleed.

## 2018-09-25 NOTE — Progress Notes (Signed)
HPI:  Jack Joseph is a 72 y.o.male here today for his routine Medicare Preventive visit.  Last CPE: > 1 year ago. He lives with his wife.  Regular exercise 3 or more times per week: Not regularly but he is active.  Following a healthy diet: Yes. He eats at home most of the time.  He works 4 times per week.  Functional Status Survey: Is the patient deaf or have difficulty hearing?: Yes Does the patient have difficulty seeing, even when wearing glasses/contacts?: No Does the patient have difficulty concentrating, remembering, or making decisions?: No Does the patient have difficulty walking or climbing stairs?: No Does the patient have difficulty dressing or bathing?: No Does the patient have difficulty doing errands alone such as visiting a doctor's office or shopping?: No  Fall Risk  09/26/2018 06/13/2017 10/25/2016 07/10/2016  Falls in the past year? 0 No No No  Number falls in past yr: 0 - - -  Injury with Fall? 0 - - -  Follow up Education provided - - -     Providers he sees regularly:  Eye care provider: He does not have one. Last eye exam a few years ago. Cardiologist, Dr Swaziland. electrophysiologist, Dr Ladona Ridgel. cardi thoracic surgeon, Dr Tyrone Sage  Depression screen [facility] 2/9 09/26/2018  Decreased Interest 0  Down, Depressed, Hopeless 0  PHQ - 2 Score 0  Altered sleeping -  Tired, decreased energy -  Change in appetite -  Feeling bad or failure about yourself  -  Trouble concentrating -  Moving slowly or fidgety/restless -  Suicidal thoughts -  PHQ-9 Score -  Difficult doing work/chores -    Mini-Cog - 09/28/18 1757    Normal clock drawing test?  yes    How many words correct?  3         Hearing Screening   125Hz  250Hz  500Hz  1000Hz  2000Hz  3000Hz  4000Hz  6000Hz  8000Hz   Right ear:   Pass Pass Pass  Fail    Left ear:   Fail Pass Pass  Fail      Chronic medical problems: HLD,HTN,hearing loss,S/P aortic valve replacement on chronic  anticoagulation,CHF with ICD placement in 06/2017,CKD III, and hyperglycemia.  On Coumadin.  HLD: On non pharmacologic treatment.  Lab Results  Component Value Date   CHOL 192 09/05/2018   HDL 71 09/05/2018   LDLCALC 109 (H) 09/05/2018   TRIG 62 09/05/2018   CHOLHDL 3 09/16/2017   HTN: On Spironolactone 25 mg and Cozaar 25 mg daily. CHF, LVEF 20-25%. Deneis orthopnea or PND.  Lab Results  Component Value Date   CREATININE 1.68 (H) 09/05/2018   BUN 21 09/05/2018   NA 134 09/05/2018   K 4.8 09/05/2018   CL 95 (L) 09/05/2018   CO2 24 09/05/2018   Hypothyroidism:  He is on Synthroid 50 mcg daily x 6 and 100 mcg Monday's.Treatment recently adjusted by Dr Larence Penning.  Tolerating medication well, no side effects reported. Negative for abdominal pain, changes in bowel habits, tremor, cold/heat intolerance, or abnormal weight loss.  Lab Results  Component Value Date   TSH 5.640 (H) 09/05/2018   He is on Amiodarone 200 mg from Monday through Friday.   Immunization History  Administered Date(s) Administered  . Influenza, High Dose Seasonal PF 09/16/2017  . Pneumococcal Conjugate-13 09/16/2017   -Hep C screening : 09/2017 NR.   Last colon cancer screening: He has refused. Last prostate ca screening:   Lab Results  Component Value Date  PSA 0.83 09/16/2017   Nocturia x 2-3 times,stable. Denies dysuria,increased urinary frequency, gross hematuria,or decreased urine output.  Former smoker.  -Denies high alcohol intake or Hx of illicit drug use.  He has no concerns today.   Review of Systems  Constitutional: Negative for activity change, appetite change, fatigue, fever and unexpected weight change.  HENT: Negative for nosebleeds, sore throat and trouble swallowing.   Eyes: Negative for redness and visual disturbance.  Respiratory: Negative for cough, shortness of breath and wheezing.   Cardiovascular: Negative for chest pain, palpitations and leg swelling.    Gastrointestinal: Negative for abdominal pain, nausea and vomiting.  Endocrine: Negative for polydipsia, polyphagia and polyuria.  Genitourinary: Negative for decreased urine volume, dysuria and hematuria.  Musculoskeletal: Negative for gait problem and myalgias.  Neurological: Negative for dizziness, syncope, weakness and headaches.  Psychiatric/Behavioral: Negative for confusion. The patient is not nervous/anxious.      Current Outpatient Medications on File Prior to Visit  Medication Sig Dispense Refill  . amiodarone (PACERONE) 200 MG tablet Take one tablet by mouth daily Monday through Friday.  DO NOT take on Saturday or Sunday. 90 tablet 3  . levothyroxine (SYNTHROID) 50 MCG tablet Take 1 tablet ( 50 mcg ) daily except take 2 tablets ( 100 mcg ) on Mondays 34 tablet 6  . losartan (COZAAR) 25 MG tablet TAKE ONE-HALF TABLET (12.5 MG TOTAL) BY MOUTH DAILY 45 tablet 2  . spironolactone (ALDACTONE) 25 MG tablet Take 12.5 mg by mouth daily.    Marland Kitchen warfarin (COUMADIN) 5 MG tablet Take as directed by coumadin clinic     No current facility-administered medications on file prior to visit.      Past Medical History:  Diagnosis Date  . AVD (aortic valve disease)   . Chronic anticoagulation   . Dilated cardiomyopathy (HCC)    EF 30-35%  . Dysrhythmia   . Hyperlipidemia   . LV dysfunction     Past Surgical History:  Procedure Laterality Date  . AORTIC VALVE REPLACEMENT    . BENTALL PROCEDURE N/A 04/29/2017   Procedure: REPLACEMENT ASCENDING AORTA/ WHEAT PROCEDURE, HYPOTHERMIC CIRCULATORY ARREST AND CARDIOPULMONARY BYPASS, AND USE OF HEMASHIELD PLATINUM WOVEN DOUBLE VELOUR VASCULAR GRAFT 32 MM X 50 CM;  Surgeon: [name], MD;  Location: [facility] OR;  Service: Open Heart Surgery;  Laterality: N/A;  . DOPPLER ECHOCARDIOGRAPHY  04/07/2002   EF 30-35%  . ICD IMPLANT N/A 07/12/2017   Procedure: ICD Implant;  Surgeon: Marinus Maw, MD;  Location: [facility] INVASIVE CV LAB;  Service:  Cardiovascular;  Laterality: N/A;  . RIGHT/LEFT HEART CATH AND CORONARY ANGIOGRAPHY N/A 03/05/2017   Procedure: Right/Left Heart Cath and Coronary Angiography;  Surgeon: Peter M Swaziland, MD;  Location: [facility] INVASIVE CV LAB;  Service: Cardiovascular;  Laterality: N/A;  . TEE WITHOUT CARDIOVERSION N/A 04/29/2017   Procedure: TRANSESOPHAGEAL ECHOCARDIOGRAM (TEE);  Surgeon: [name], MD;  Location: [facility] OR;  Service: Open Heart Surgery;  Laterality: N/A;    Allergies  Allergen Reactions  . Morphine And Related Other (See Comments)    Hallucinations     Family History  Problem Relation Age of Onset  . Heart disease Brother     Social History   Socioeconomic History  . Marital status: Married    Spouse name: Not on file  . Number of children: Not on file  . Years of education: Not on file  . Highest education level: Not on file  Occupational History  . Not on  file  Social Needs  . Financial resource strain: Not on file  . Food insecurity:    Worry: Not on file    Inability: Not on file  . Transportation needs:    Medical: Not on file    Non-medical: Not on file  Tobacco Use  . Smoking status: Former Smoker    Last attempt to quit: 11/12/2000    Years since quitting: 17.8  . Smokeless tobacco: Never Used  Substance and Sexual Activity  . Alcohol use: No  . Drug use: No  . Sexual activity: Yes  Lifestyle  . Physical activity:    Days per week: Not on file    Minutes per session: Not on file  . Stress: Not at all  Relationships  . Social connections:    Talks on phone: More than three times a week    Gets together: More than three times a week    Attends religious service: Not on file    Active member of club or organization: Not on file    Attends meetings of clubs or organizations: Never    Relationship status: Married  Other Topics Concern  . Not on file  Social History Narrative  . Not on file     Vitals:   09/26/18 0932  BP: 112/70  Pulse: 83  Resp:  16  Temp: 97.8 F (36.6 C)  SpO2: 98%   Body mass index is 21.29 kg/m.   Wt Readings from Last 3 Encounters:  09/26/18 148 lb 6 oz (67.3 kg)  09/05/18 145 lb (65.8 kg)  08/22/18 141 lb 9.6 oz (64.2 kg)    Physical Exam  Nursing note and vitals reviewed. Constitutional: He is oriented to person, place, and time. He appears well-developed and well-nourished. No distress.  HENT:  Head: Normocephalic and atraumatic.  Mouth/Throat: Oropharynx is clear and moist and mucous membranes are normal. He has dentures.  Eyes: Pupils are equal, round, and reactive to light. Conjunctivae are normal.  Cardiovascular: Normal rate. An irregular rhythm present.  No murmur heard. Mechanical AV click. DP present bilateral.  Respiratory: Effort normal and breath sounds normal. No respiratory distress.  GI: Soft. He exhibits no mass. There is no hepatomegaly. There is no tenderness.  Musculoskeletal: He exhibits edema (1+ pitting LE edema bilateral.).  Lymphadenopathy:    He has no cervical adenopathy.  Neurological: He is alert and oriented to person, place, and time. He has normal strength. No cranial nerve deficit. Gait normal.  Skin: Skin is warm. No rash noted. No erythema.  Psychiatric: He has a normal mood and affect. Cognition and memory are normal.  Well groomed, good eye contact.     ASSESSMENT AND PLAN:  Jack Joseph was seen today for annual exam and medicare wellness.  Diagnoses and all orders for this visit:  Medicare annual wellness visit, subsequent We discussed the importance of staying active, physically and mentally, as well as the benefits of a healthy/balance diet. Low impact exercise that involve stretching and strengthing are ideal. Vaccines: I do not see Pneumovax vaccine on records,he is not sure of he had it at age 23 but he is not interested in vaccination.   We discussed preventive screening for the next 5-10 years, summery of recommendations given in AVS:  He has  not had colonoscopy done,he is not interested in having colon cancer screening. Overdue for eye exam,recommend glaucoma screening and eye exam q 1-2 years. Hyperglycemia,he is not interested in diabetes screening,A1C.  AAA screening: [place]  PET 02/2017 no abdominal vascular abnormalities. /pros in men if applicable, lung ca).He is not interested in abdominal US.  Fall prevention.  Advance directives and end of life discussed. I gave him forms/ducumentation (Advance directives and POA) to complete if interested in doing so,strongly recommended.    Essential hypertension Adequately controlled. No changes in current management. DASH -low salt diet to continue.  Hypothyroidism, unspecified type Levothyroxine recently adjusted by Dr Larence Penning.  Hyperlipidemia, unspecified hyperlipidemia type Continue non pharmacologic treatment.   Return in about 1 year (around 09/27/2019) for Medicare .     G. Swaziland, MD  [facility]. [place] office.

## 2018-09-26 ENCOUNTER — Ambulatory Visit (INDEPENDENT_AMBULATORY_CARE_PROVIDER_SITE_OTHER): Payer: Medicare Other | Admitting: Family Medicine

## 2018-09-26 ENCOUNTER — Encounter: Payer: Self-pay | Admitting: Family Medicine

## 2018-09-26 VITALS — BP 112/70 | HR 83 | Temp 97.8°F | Resp 16 | Ht 70.0 in | Wt 148.4 lb

## 2018-09-26 DIAGNOSIS — E039 Hypothyroidism, unspecified: Secondary | ICD-10-CM

## 2018-09-26 DIAGNOSIS — Z0000 Encounter for general adult medical examination without abnormal findings: Secondary | ICD-10-CM

## 2018-09-26 DIAGNOSIS — E785 Hyperlipidemia, unspecified: Secondary | ICD-10-CM

## 2018-09-26 DIAGNOSIS — I10 Essential (primary) hypertension: Secondary | ICD-10-CM | POA: Diagnosis not present

## 2018-09-26 NOTE — Patient Instructions (Addendum)
A few things to remember from today's visit:   Hyperglycemia   A few tips:  -As we age balance is not as good as it was, so there is a higher risks for falls. Please remove small rugs and furniture that is "in your way" and could increase the risk of falls. Stretching exercises may help with fall prevention: Yoga and Tai Chi are some examples. Low impact exercise is better, so you are not very achy the next day.  -Sun screen and avoidance of direct sun light recommended. Caution with dehydration, if working outdoors be sure to drink enough fluids.  - Some medications are not safe as we age, increases the risk of side effects and can potentially interact with other medication you are also taken;  including some of over the counter medications. Be sure to let me know when you start a new medication even if it is a dietary/vitamin supplement.   -Healthy diet low in red meet/animal fat and sugar + regular physical activity is recommended.       Screening schedule for the next 5-10 years:  Colonoscopy  Glaucoma screening/eye exam every 1-2 years.  Flu vaccine annually.  Diabetes screening   Fall prevention   Advance directives:  Please see a lawyer and/or go to this website to help you with advanced directives and designating a health care power of attorney so that your wishes will be followed should you become too ill to make your own medical decisions.  [email or web address]  Please be sure medication list is accurate. If a new problem present, please set up appointment sooner than planned today.

## 2018-10-14 ENCOUNTER — Ambulatory Visit (INDEPENDENT_AMBULATORY_CARE_PROVIDER_SITE_OTHER): Payer: Medicare Other

## 2018-10-14 DIAGNOSIS — I420 Dilated cardiomyopathy: Secondary | ICD-10-CM

## 2018-10-14 DIAGNOSIS — I472 Ventricular tachycardia, unspecified: Secondary | ICD-10-CM

## 2018-10-14 NOTE — Progress Notes (Signed)
Remote ICD transmission.   

## 2018-10-17 ENCOUNTER — Ambulatory Visit (INDEPENDENT_AMBULATORY_CARE_PROVIDER_SITE_OTHER): Payer: Medicare Other | Admitting: *Deleted

## 2018-10-17 DIAGNOSIS — Z95828 Presence of other vascular implants and grafts: Secondary | ICD-10-CM | POA: Diagnosis not present

## 2018-10-17 DIAGNOSIS — Z952 Presence of prosthetic heart valve: Secondary | ICD-10-CM

## 2018-10-17 DIAGNOSIS — Z5181 Encounter for therapeutic drug level monitoring: Secondary | ICD-10-CM

## 2018-10-17 DIAGNOSIS — E039 Hypothyroidism, unspecified: Secondary | ICD-10-CM

## 2018-10-17 DIAGNOSIS — Z7901 Long term (current) use of anticoagulants: Secondary | ICD-10-CM | POA: Diagnosis not present

## 2018-10-17 DIAGNOSIS — I359 Nonrheumatic aortic valve disorder, unspecified: Secondary | ICD-10-CM

## 2018-10-17 LAB — POCT INR: INR: 1.9 — AB (ref 2.0–3.0)

## 2018-10-17 NOTE — Patient Instructions (Signed)
Description   Take 1 tablet today, then resume at 1/2 tablet daily. We recommend coming in to recheck your INR in 2 weeks. In not coming back in the timeframe we recommend, you are assuming the liability of being at increased risk for having a stroke, blood clot, or bleed. Recheck in 3 weeks.

## 2018-11-07 ENCOUNTER — Ambulatory Visit (INDEPENDENT_AMBULATORY_CARE_PROVIDER_SITE_OTHER): Payer: Medicare Other | Admitting: *Deleted

## 2018-11-07 DIAGNOSIS — Z95828 Presence of other vascular implants and grafts: Secondary | ICD-10-CM | POA: Diagnosis not present

## 2018-11-07 DIAGNOSIS — I359 Nonrheumatic aortic valve disorder, unspecified: Secondary | ICD-10-CM

## 2018-11-07 DIAGNOSIS — Z952 Presence of prosthetic heart valve: Secondary | ICD-10-CM | POA: Diagnosis not present

## 2018-11-07 DIAGNOSIS — Z5181 Encounter for therapeutic drug level monitoring: Secondary | ICD-10-CM | POA: Diagnosis not present

## 2018-11-07 DIAGNOSIS — E039 Hypothyroidism, unspecified: Secondary | ICD-10-CM

## 2018-11-07 DIAGNOSIS — Z7901 Long term (current) use of anticoagulants: Secondary | ICD-10-CM | POA: Diagnosis not present

## 2018-11-07 LAB — POCT INR: INR: 3 (ref 2.0–3.0)

## 2018-11-07 NOTE — Patient Instructions (Signed)
Description   Continue taking 1/2 tablet daily. We recommend coming in to recheck your INR in 2-3 weeks. In not coming back in the timeframe we recommend, you are assuming the liability of being at increased risk for having a stroke, blood clot, or bleed. Recheck in 5 weeks.

## 2018-11-09 ENCOUNTER — Other Ambulatory Visit: Payer: Self-pay | Admitting: Cardiology

## 2018-12-04 LAB — CUP PACEART REMOTE DEVICE CHECK
Date Time Interrogation Session: 20200123170207
Implantable Lead Implant Date: 20180831
Implantable Lead Model: 293
Implantable Lead Serial Number: 435986
Implantable Pulse Generator Implant Date: 20180831
MDC IDC LEAD LOCATION: 753860
Pulse Gen Serial Number: 240202

## 2018-12-12 ENCOUNTER — Ambulatory Visit (INDEPENDENT_AMBULATORY_CARE_PROVIDER_SITE_OTHER): Payer: Medicare Other | Admitting: Pharmacist

## 2018-12-12 DIAGNOSIS — I359 Nonrheumatic aortic valve disorder, unspecified: Secondary | ICD-10-CM

## 2018-12-12 DIAGNOSIS — Z952 Presence of prosthetic heart valve: Secondary | ICD-10-CM | POA: Diagnosis not present

## 2018-12-12 DIAGNOSIS — Z95828 Presence of other vascular implants and grafts: Secondary | ICD-10-CM

## 2018-12-12 DIAGNOSIS — Z5181 Encounter for therapeutic drug level monitoring: Secondary | ICD-10-CM

## 2018-12-12 DIAGNOSIS — Z7901 Long term (current) use of anticoagulants: Secondary | ICD-10-CM

## 2018-12-12 DIAGNOSIS — E039 Hypothyroidism, unspecified: Secondary | ICD-10-CM

## 2018-12-12 LAB — POCT INR: INR: 2.2 (ref 2.0–3.0)

## 2018-12-12 NOTE — Patient Instructions (Signed)
Take an extra 1/2 tablet tonight, then continue taking 1/2 tablet daily. We recommend coming in to recheck your INR in 2 weeks. If not coming back in the timeframe we recommend, you are assuming the liability of being at increased risk for having a stroke, blood clot, or bleed. Recheck in 6 weeks.

## 2018-12-13 LAB — T4, FREE: Free T4: 1.51 ng/dL (ref 0.82–1.77)

## 2018-12-13 LAB — TSH: TSH: 4.51 u[IU]/mL — ABNORMAL HIGH (ref 0.450–4.500)

## 2018-12-15 ENCOUNTER — Other Ambulatory Visit: Payer: Self-pay

## 2018-12-15 DIAGNOSIS — E039 Hypothyroidism, unspecified: Secondary | ICD-10-CM

## 2018-12-15 MED ORDER — LEVOTHYROXINE SODIUM 50 MCG PO TABS: ORAL_TABLET | ORAL | 6 refills | Status: DC

## 2019-01-07 ENCOUNTER — Ambulatory Visit (INDEPENDENT_AMBULATORY_CARE_PROVIDER_SITE_OTHER): Payer: Medicare Other

## 2019-01-07 ENCOUNTER — Ambulatory Visit (INDEPENDENT_AMBULATORY_CARE_PROVIDER_SITE_OTHER): Payer: Medicare Other | Admitting: Family Medicine

## 2019-01-07 ENCOUNTER — Encounter: Payer: Self-pay | Admitting: Family Medicine

## 2019-01-07 VITALS — BP 84/54 | HR 77 | Temp 98.8°F | Wt 141.4 lb

## 2019-01-07 DIAGNOSIS — R05 Cough: Secondary | ICD-10-CM

## 2019-01-07 DIAGNOSIS — R059 Cough, unspecified: Secondary | ICD-10-CM

## 2019-01-07 LAB — POC INFLUENZA A&B (BINAX/QUICKVUE)
Influenza A, POC: NEGATIVE
Influenza B, POC: NEGATIVE

## 2019-01-07 NOTE — Patient Instructions (Addendum)
If weight has not increased by end of March, please schedule a follow up visit with Dr. Swaziland.   Magic cups ice cream have added protein. Fairlife milk has increased protein.

## 2019-01-07 NOTE — Progress Notes (Signed)
[name] DOB: 19-Aug-1946 Encounter date: 01/07/2019  This is a 73 y.o. male who presents with Chief Complaint  Patient presents with  . Cough    dry cough, no congestion, sob yesterday,       History of present illness:  Used to have more bronchitis issues when he smoked; has been better since quitting 10-15 yrs ago.   Was having tougher time breathing last few days at work. Was over-stressing to get parts out on time. More pressure. Yesterday breathing felt worse.  States that he is really only here because his wife wanted him to be evaluated.  He personally feels that he is doing fine.  Short of breath just when strenuously working. 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   Rarely will get something up with cough; more productive in the morning.   Actually quit job this morning.  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  When he asked for an easier route he was told that if he can do the job he should quit so he did.  He had been at his place of employment for about 10 years.  Had low 100.6 temp this Sunday; took tylenol for this. Since then hasn't noted fever. Coughing continues. Thinks there is some relation to carpet in bedroom.   Not feeling wheezy.      Allergies  Allergen Reactions  . Morphine And Related Other (See Comments)    Hallucinations    Current Meds  Medication Sig  . amiodarone (PACERONE) 200 MG tablet Take one tablet by mouth daily Monday through Friday.  DO NOT take on Saturday or Sunday.  . levothyroxine (SYNTHROID) 50 MCG tablet Take 50 mcg daily except take 2 tablets ( 100 mcg ) on Mon and Thurs  . losartan (COZAAR) 25 MG tablet TAKE ONE-HALF TABLET (12.5 MG TOTAL) BY MOUTH DAILY  . spironolactone (ALDACTONE) 25 MG tablet TAKE ONE-HALF TABLET BY MOUTH DAILY  . warfarin (COUMADIN) 5 MG tablet Take as directed by coumadin clinic    Review of Systems   Constitutional: Negative for chills, fatigue and fever.  HENT: Negative for congestion, ear pain, postnasal drip, sinus pressure, sinus pain and sore throat.   Respiratory: Positive for cough. Negative for chest tightness, shortness of breath (as long as not doing lifting/loading/unloading of truck he feels that breathing is normal for him) and wheezing.   Cardiovascular: Negative for chest pain, palpitations and leg swelling.    Objective:  BP (!) 84/54 (BP Location: Right Arm, Patient Position: Sitting, Cuff Size: Normal)   Pulse 77   Temp 98.8 F (37.1 C) (Oral)   Wt 141 lb 6.4 oz (64.1 kg)   SpO2 92%   BMI 20.29 kg/m   Weight: 141 lb 6.4 oz (64.1 kg)   BP Readings from Last 3 Encounters:  01/07/19 (!) 84/54  09/26/18 112/70  09/05/18 110/68   Wt Readings from Last 3 Encounters:  01/07/19 141 lb 6.4 oz (64.1 kg)  09/26/18 148 lb 6 oz (67.3 kg)  09/05/18 145 lb (65.8 kg)    Physical Exam Constitutional:      General: He is not in acute distress.    Appearance: He is well-developed.  HENT:     Right Ear: Tympanic membrane, ear canal and external ear normal.     Left Ear: Tympanic membrane, ear canal and external ear normal.     Mouth/Throat:  Mouth: Mucous membranes are moist.     Pharynx: No pharyngeal swelling, oropharyngeal exudate or posterior oropharyngeal erythema.  Cardiovascular:     Rate and Rhythm: Normal rate and regular rhythm.     Heart sounds: Normal heart sounds. No murmur. No friction rub.  Pulmonary:     Effort: Pulmonary effort is normal. No respiratory distress.     Breath sounds: Decreased breath sounds (lower lobes) present. No wheezing, rhonchi or rales.  Musculoskeletal:     Right lower leg: No edema.     Left lower leg: No edema.  Neurological:     Mental Status: He is alert and oriented to person, place, and time.  Psychiatric:        Behavior: Behavior normal.     Assessment/Plan 1. Cough Testing was negative in the office.  Will  send for chest x-ray since exam does seem stable and follow-up pending this result.  He worsening of symptoms certainly I would cover with antibiotic.  I do think he may benefit from inhaler use, but he is hesitant to try this.  He is agreeable to making decision on further treatment pending x-ray results. - POC Influenza A&B(BINAX/QUICKVUE) - DG Chest 2 View; Future    Return pending xray result.    Theodis Shove, MD

## 2019-01-08 ENCOUNTER — Ambulatory Visit: Payer: Self-pay

## 2019-01-08 ENCOUNTER — Other Ambulatory Visit: Payer: Self-pay

## 2019-01-08 MED ORDER — DOXYCYCLINE HYCLATE 100 MG PO TABS: 100.0000 mg | ORAL_TABLET | Freq: Two times a day (BID) | ORAL | 0 refills | Status: DC

## 2019-01-08 NOTE — Telephone Encounter (Signed)
Daughter and pt. Called for CXR results from 2/26.  Daughter reported pt. C/o chills, body aches, coughing a lot, but unable to cough anything up.  Stated "he struggles to get a good breath; denied that this is worse than when seen in office yesterday.   Daughter and pt. Are asking what the next step is.    Called FC.  Able to conference in the daughter and patient with CMA from the office, to review chest xray results and the next step.

## 2019-01-09 ENCOUNTER — Telehealth: Payer: Self-pay | Admitting: Family Medicine

## 2019-01-09 NOTE — Telephone Encounter (Signed)
Left message for pt to return call to office for lab results  

## 2019-01-09 NOTE — Telephone Encounter (Signed)
Copied from CRM [id number]. Topic: Quick Communication - Lab Results (Clinic Use ONLY) >> Jan 09, 2019 11:04 AM Solon Augusta, CMA wrote: Called patient to inform them of 01/07/2019 lab results. When patient returns call, triage nurse may disclose results.  daughter calling  (pt is with her) for results of xray and to see how he is feeling per the provider he saw, Dr Hassan Rowan.

## 2019-01-13 ENCOUNTER — Ambulatory Visit (INDEPENDENT_AMBULATORY_CARE_PROVIDER_SITE_OTHER): Payer: Medicare Other | Admitting: *Deleted

## 2019-01-13 DIAGNOSIS — I472 Ventricular tachycardia, unspecified: Secondary | ICD-10-CM

## 2019-01-13 DIAGNOSIS — I5022 Chronic systolic (congestive) heart failure: Secondary | ICD-10-CM

## 2019-01-16 ENCOUNTER — Other Ambulatory Visit: Payer: Self-pay | Admitting: Cardiology

## 2019-01-17 LAB — CUP PACEART REMOTE DEVICE CHECK
Battery Remaining Longevity: 174 mo
Battery Remaining Percentage: 100 %
Brady Statistic RV Percent Paced: 0 %
Date Time Interrogation Session: 20200303083000
HighPow Impedance: 57 Ohm
Implantable Lead Implant Date: 20180831
Implantable Lead Location: 753860
Implantable Lead Model: 293
Implantable Lead Serial Number: 435986
Implantable Pulse Generator Implant Date: 20180831
Lead Channel Impedance Value: 443 Ohm
Lead Channel Setting Sensing Sensitivity: 0.5 mV
MDC IDC SET LEADCHNL RV PACING AMPLITUDE: 2.5 V
MDC IDC SET LEADCHNL RV PACING PULSEWIDTH: 0.4 ms
Pulse Gen Serial Number: 240202

## 2019-01-20 ENCOUNTER — Telehealth: Payer: Self-pay | Admitting: *Deleted

## 2019-01-20 NOTE — Telephone Encounter (Signed)
Will also plan to enroll in [facility] if patient is agreeable.

## 2019-01-20 NOTE — Telephone Encounter (Signed)
Per remote transmission interpretation from 01/13/19:  "Scheduled remote reviewed. Normal device function. 1 NST 194bpm. Also noted Heart Logic index 23 on March 2nd. Night heart rate has increased and thoracic impedance dropped. Will route to Triage as well as export to MD for review."  HeartLogic index now 25 as of 01/16/19, suggesting ongoing fluid retention since ~01/09/19. Spoke with patient. He reports weight is stable at 141.5lb. Pt notes some increased ShOB in recent weeks, was seen by PCP due to low grade fever and cough, CXR done on 2/26, started on doxycycline. Pt has one more day of antibiotics left. Reports some improvement in symptoms on the antibiotic. He denies any LE edema, abdominal bloating, or other CHF symptoms.   Pt reports compliance with medications and reports he avoids sodium when possible. Advised I will route this message to Dr. Ladona Ridgel and Dr. Swaziland for recommendations. Pt is agreeable to plan and aware to call back in the interim for any new or worsening symptoms.

## 2019-01-21 NOTE — Progress Notes (Signed)
Remote ICD transmission.   

## 2019-01-22 ENCOUNTER — Telehealth: Payer: Self-pay | Admitting: Family Medicine

## 2019-01-22 NOTE — Telephone Encounter (Signed)
Patient called and says he received a letter saying that he was not able to be reached by phone for his lab results, he says he did not receive a call from the office. I verified his phone number [phone]. I advised that his daughter received his chest x-ray results noted by Dr. Hassan Rowan on 01/07/19, I gave the results to the patient, he verbalized understanding. Then I advised of another note by Dr. Hassan Rowan on 01/09/19 asking about his symptoms. He says that he's fine now.

## 2019-01-23 ENCOUNTER — Ambulatory Visit (INDEPENDENT_AMBULATORY_CARE_PROVIDER_SITE_OTHER): Payer: Medicare Other | Admitting: Pharmacist

## 2019-01-23 ENCOUNTER — Other Ambulatory Visit: Payer: Self-pay

## 2019-01-23 DIAGNOSIS — E039 Hypothyroidism, unspecified: Secondary | ICD-10-CM

## 2019-01-23 DIAGNOSIS — Z5181 Encounter for therapeutic drug level monitoring: Secondary | ICD-10-CM

## 2019-01-23 DIAGNOSIS — Z95828 Presence of other vascular implants and grafts: Secondary | ICD-10-CM | POA: Diagnosis not present

## 2019-01-23 DIAGNOSIS — Z952 Presence of prosthetic heart valve: Secondary | ICD-10-CM | POA: Diagnosis not present

## 2019-01-23 LAB — POCT INR: INR: 5.3 — AB (ref 2.0–3.0)

## 2019-01-23 NOTE — Patient Instructions (Addendum)
Description   Skip your Coumadin tomorrow and Sunday, then continue taking 1/2 tablet daily. Recheck INR in 2 weeks.

## 2019-01-23 NOTE — Telephone Encounter (Signed)
Noted  

## 2019-02-18 ENCOUNTER — Telehealth: Payer: Self-pay | Admitting: *Deleted

## 2019-02-18 ENCOUNTER — Other Ambulatory Visit: Payer: Self-pay | Admitting: Internal Medicine

## 2019-02-18 NOTE — Telephone Encounter (Signed)
   Cardiac Questionnaire:    Since your last visit or hospitalization:    1. Have you been having new or worsening chest pain? NO   2. Have you been having new or worsening shortness of breath? NO 3. Have you been having new or worsening leg swelling, wt gain, or increase in abdominal girth (pants fitting more tightly)? NO   4. Have you had any passing out spells? NO    *A YES to any of these questions would result in the appointment being kept. *If all the answers to these questions are NO, we should indicate that given the current situation regarding the worldwide coronarvirus pandemic, at the recommendation of the CDC, we are looking to limit gatherings in our waiting area, and thus will reschedule their appointment beyond four weeks from today.   _____________   COVID-19 Pre-Screening Questions:  . Do you currently have a fever? NO . Have you recently travelled on a cruise, internationally, or to [place], [place], [place], [place], [place], or [place], [place] [employer])? NO . Have you been in contact with someone that is currently pending confirmation of Covid19 testing or has been confirmed to have the Covid19 virus?  NO . Are you currently experiencing fatigue or cough? NO    Spoke with patient and he is aware on his telephone visit with Dr. Swaziland on Friday February 20, 2019.

## 2019-02-19 NOTE — Progress Notes (Signed)
Virtual Visit via Telephone Note   This visit type was conducted due to national recommendations for restrictions regarding the COVID-19 Pandemic (e.g. social distancing) in an effort to limit this patient's exposure and mitigate transmission in our community.  Due to his co-morbid illnesses, this patient is at least at moderate risk for complications without adequate follow up.  This format is felt to be most appropriate for this patient at this time.  The patient did not have access to video technology/had technical difficulties with video requiring transitioning to audio format only (telephone).  All issues noted in this document were discussed and addressed.  No physical exam could be performed with this format.  Please refer to the patient's chart for his  consent to telehealth for [facility].   Evaluation Performed:  Follow-up visit  Date:  02/20/2019   ID:  [name], DOB 1946/01/28, MRN [id number]  Patient Location: Home  Provider Location: Office  PCP:  [place], Betty G, MD  Cardiologist:  Scotti Motter [place], MD  Electrophysiologist:  None   Chief Complaint:  Dyspnea   History of Present Illness:    Jack Joseph is a 73 y.o. male who presents via audio/video conferencing for a telehealth visit today.  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 He is on chronic Coumadin therapy. He has a history of dilated cardiomyopathy with prior ejection fraction of 30-35%.   When seen in 2018 CXR showed a left hilar rounded mass and CT was recommended. 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 An Echo was also obtained showing normal functioning of his prosthetic AV. It confirmed the ascending aortic aneurysm. LV function was severely reduced with EF 25-30%. There was mild pulmonary HTN. Cardiac cath showed mild nonobstructive CAD with  normal right heart pressures and cardiac output. The prosthetic AV disc moved well on fluoro.   On 04/29/17 he underwent a Wheat procedure with replacement of his ascending aorta by Dr. Tyrone SageGerhardt. The prior AV prosthesis was left in place. His post op course was complicated. He was initially extubated on the evening of surgery and inotropes were weaned. On the next day he developed pulseless VT with cardiac arrest. He required cardioversion, CPR, and reintubation. He was loaded on IV amiodarone. He developed a pneumothorax and had a chest tube placed. On POD #3 he was once again extubated. He developed subcutaneous emphysema on the right and had a second chest tube placed. He required reintubation again due to mucus plugging. On POD#6 he was extubated again. On POD #8 his subcutaneous emphysema worsened and his right chest tube had to be replaced. He required transfusion on 2 occasions for anemia. On POD #8 he had increased atelectasis. He had bronchoscopy. Breathing again worsened and he was reintubated. He required TPN for nutritional support and IV pressors for BP support. He improved and was able to be extubated on POD #13 and chest tubes removed on POD #16. Diet was advanced and he was transitioned to coumadin. He also developed diarrhea and was C. Diff positive. This was treated. On July 12 he was transferred to inpatient Rehab for PT/OT.  He was DC home on July 20 on coumadin, amiodarone, and losartan.  Echo on July 9 showed EF 20-25%. Dr. Ladona Ridgelaylor recommended consideration for ICD implant and this was done on 07/12/17. Amiodarone dose decreased to 200  mg daily except none on Sunday. Last ICD check in March 2020 showed no new episodes of VT and normal device function.  On our visit today he still notes some dyspnea and fatigue walking up hill. No chest pain or palpitations. Still losing weight. Appetite is good and he is eating calorie rich foods but doesn't eat as much as he used to. No edema. No dizziness.  He is no longer working.   The patient does not have symptoms concerning for COVID-19 infection (fever, chills, cough, or new shortness of breath).    Past Medical History:  Diagnosis Date   AVD (aortic valve disease)    Chronic anticoagulation    Dilated cardiomyopathy (HCC)    EF 30-35%   Dysrhythmia    Hyperlipidemia    LV dysfunction    Past Surgical History:  Procedure Laterality Date   AORTIC VALVE REPLACEMENT     BENTALL PROCEDURE N/A 04/29/2017   Procedure: REPLACEMENT ASCENDING AORTA/ WHEAT PROCEDURE, HYPOTHERMIC CIRCULATORY ARREST AND CARDIOPULMONARY BYPASS, AND USE OF HEMASHIELD PLATINUM WOVEN DOUBLE VELOUR VASCULAR GRAFT 32 MM X 50 CM;  Surgeon: [name], MD;  Location: [facility] OR;  Service: Open Heart Surgery;  Laterality: N/A;   DOPPLER ECHOCARDIOGRAPHY  04/07/2002   EF 30-35%   ICD IMPLANT N/A 07/12/2017   Procedure: ICD Implant;  Surgeon: Marinus Maw, MD;  Location: [facility] INVASIVE CV LAB;  Service: Cardiovascular;  Laterality: N/A;   RIGHT/LEFT HEART CATH AND CORONARY ANGIOGRAPHY N/A 03/05/2017   Procedure: Right/Left Heart Cath and Coronary Angiography;  Surgeon: Sabine Tenenbaum M [place], MD;  Location: [facility] INVASIVE CV LAB;  Service: Cardiovascular;  Laterality: N/A;   TEE WITHOUT CARDIOVERSION N/A 04/29/2017   Procedure: TRANSESOPHAGEAL ECHOCARDIOGRAM (TEE);  Surgeon: [name], MD;  Location: [facility] OR;  Service: Open Heart Surgery;  Laterality: N/A;     Current Meds  Medication Sig   amiodarone (PACERONE) 200 MG tablet TAKE ONE TABLET BY MOUTH DAILY   levothyroxine (SYNTHROID) 50 MCG tablet Take 50 mcg daily except take 2 tablets ( 100 mcg ) on Mon and Thurs   losartan (COZAAR) 25 MG tablet TAKE ONE-HALF TABLET (12.5 MG TOTAL) BY MOUTH DAILY   spironolactone (ALDACTONE) 25 MG tablet TAKE ONE-HALF TABLET BY MOUTH DAILY   warfarin (COUMADIN) 5 MG tablet TAKE 1/2 TO 1 (ONE-HALF TO ONE) TABLET BY MOUTH ONCE DAILY OR AS DIRECTED BY THE COUMADIN CLINIC      Allergies:   Morphine and related   Social History   Tobacco Use   Smoking status: Former Smoker    Last attempt to quit: 11/12/2000    Years since quitting: 18.2   Smokeless tobacco: Never Used  Substance Use Topics   Alcohol use: No   Drug use: No     Family Hx: The patient's family history includes Heart disease in his brother.  ROS:   Please see the history of present illness.    All other systems reviewed and are negative.   Prior CV studies:   The following studies were reviewed today:  Echo 05/20/17: Study Conclusions  - Procedure narrative: Transthoracic echocardiography. Image   quality was poor. The study was technically difficult, as a   result of poor sound wave transmission. - Left ventricle: The cavity size was mildly dilated. Wall   thickness was normal. Systolic function was severely reduced. The   estimated ejection fraction was in the range of 20% to 25%.   Diffuse hypokinesis. Features are consistent with a pseudonormal  left ventricular filling pattern, with concomitant abnormal   relaxation and increased filling pressure (grade 2 diastolic   dysfunction). - Aortic valve: A mechanical prosthesis was present. - Mitral valve: There was mild regurgitation. - Pericardium, extracardiac: There was a left pleural effusion.  Impressions:  - Severe global reduction in LV systolic function; moderate   diastolic dysfunction; mild LVE; s/p AVR with normal mean   gradient; mildly dilated aortic root; mild MR.  Labs/Other Tests and Data Reviewed:    EKG:  No ECG reviewed.  Recent Labs: 03/18/2018: Hemoglobin 13.3; Platelets 264 09/05/2018: ALT 12; BUN 21; Creatinine, Ser 1.68; Potassium 4.8; Sodium 134 12/12/2018: TSH 4.510   Recent Lipid Panel Lab Results  Component Value Date/Time   CHOL 192 09/05/2018 11:20 AM   TRIG 62 09/05/2018 11:20 AM   HDL 71 09/05/2018 11:20 AM   CHOLHDL 3 09/16/2017 09:08 AM   LDLCALC 109 (H) 09/05/2018 11:20 AM     Wt Readings from Last 3 Encounters:  02/20/19 138 lb 8 oz (62.8 kg)  01/07/19 141 lb 6.4 oz (64.1 kg)  09/26/18 148 lb 6 oz (67.3 kg)     Objective:    Vital Signs:  BP 126/74    Pulse 72    Ht 5\' 10"  (1.778 m)    Wt 138 lb 8 oz (62.8 kg)    BMI 19.87 kg/m     ASSESSMENT & PLAN:    1.  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 He is on chronic Coumadin therapy.  Last INR 5.3 in March after being on antibiotics in February. Will check next week. SBE prophylaxis.   2. Dilated cardiomyopathy. Ejection fraction of 20-25% post Aortic aneurysm repair.  Patient is class 2. Only on losartan and aldactone at low doses. Unable to titrate due to low BP.   3. Thoracic aortic aneurysm. Ascending aorta. 6.3 cm.  Now s/p Wheat procedure with replacement. 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 Post op course very complicated due to development of VT, acute respiratory failure, subQ emphysema, multiple chest tubes, anemia, and deconditioning. He clinically is stable  4. VT following Aortic aneurysm surgery. Low EF. S/p ICD implant. Continue amiodarone 5 days/week. Last TSH OK. Last ICD check without VT  5. Hypothyroidism. Thyroid dose increased in August. On 50 micrograms. Last TSH OK.   COVID-19 Education: The signs and symptoms of COVID-19 were discussed with the patient and how to seek care for testing (follow up with PCP or arrange E-visit).  The importance of social distancing was discussed today.  Time:   Today, I have spent 15 minutes with the patient with telehealth technology discussing the above problems.     Medication Adjustments/Labs and Tests Ordered: Current medicines are reviewed at length with the patient today.  Concerns regarding medicines are outlined above.  Tests Ordered: No orders of the defined types were placed in this encounter.  Medication Changes: No orders of the defined types were placed in this  encounter.   Disposition:  Follow up 6 months  Signed, Dyami Umbach [place], MD  02/20/2019 11:06 AM    [facility]

## 2019-02-20 ENCOUNTER — Encounter: Payer: Self-pay | Admitting: *Deleted

## 2019-02-20 ENCOUNTER — Telehealth: Payer: Self-pay

## 2019-02-20 ENCOUNTER — Telehealth (INDEPENDENT_AMBULATORY_CARE_PROVIDER_SITE_OTHER): Payer: Medicare Other | Admitting: Cardiology

## 2019-02-20 ENCOUNTER — Telehealth: Payer: Self-pay | Admitting: Cardiology

## 2019-02-20 ENCOUNTER — Telehealth: Payer: Self-pay | Admitting: *Deleted

## 2019-02-20 ENCOUNTER — Encounter: Payer: Self-pay | Admitting: Cardiology

## 2019-02-20 VITALS — BP 126/74 | HR 72 | Ht 70.0 in | Wt 138.5 lb

## 2019-02-20 DIAGNOSIS — Z95828 Presence of other vascular implants and grafts: Secondary | ICD-10-CM

## 2019-02-20 DIAGNOSIS — I472 Ventricular tachycardia, unspecified: Secondary | ICD-10-CM

## 2019-02-20 DIAGNOSIS — I712 Thoracic aortic aneurysm, without rupture, unspecified: Secondary | ICD-10-CM

## 2019-02-20 DIAGNOSIS — Z952 Presence of prosthetic heart valve: Secondary | ICD-10-CM

## 2019-02-20 DIAGNOSIS — I5022 Chronic systolic (congestive) heart failure: Secondary | ICD-10-CM

## 2019-02-20 DIAGNOSIS — I420 Dilated cardiomyopathy: Secondary | ICD-10-CM

## 2019-02-20 DIAGNOSIS — Z95810 Presence of automatic (implantable) cardiac defibrillator: Secondary | ICD-10-CM

## 2019-02-20 NOTE — Telephone Encounter (Signed)

## 2019-02-20 NOTE — Telephone Encounter (Signed)
Pt calling may have miss her phone appointment pt asked for a call back please.

## 2019-02-20 NOTE — Telephone Encounter (Signed)
adv'd pt call will be at 11am today. Stated thank you.

## 2019-02-20 NOTE — Telephone Encounter (Signed)
Visit was done see encounters ./cy

## 2019-02-20 NOTE — Telephone Encounter (Signed)
   Cardiac Questionnaire:    Since your last visit or hospitalization:    1. Have you been having new or worsening chest pain? NO   2. Have you been having new or worsening shortness of breath? YES 3. Have you been having new or worsening leg swelling, wt gain, or increase in abdominal girth (pants fitting more tightly)? NO   4. Have you had any passing out spells? NO    *A YES to any of these questions would result in the appointment being kept. *If all the answers to these questions are NO, we should indicate that given the current situation regarding the worldwide coronarvirus pandemic, at the recommendation of the CDC, we are looking to limit gatherings in our waiting area, and thus will reschedule their appointment beyond four weeks from today.   _____________   COVID-19 Pre-Screening Questions:  . Do you currently have a fever? NO . Have you recently travelled on a cruise, internationally, or to [place], [place], [place], [place], [place], or [place], [place] ([place]) ?NO . Have you been in contact with someone that is currently pending confirmation of Covid19 testing or has been confirmed to have the Covid19 virus?  NO Are you currently experiencing fatigue or cough? NO          

## 2019-02-20 NOTE — Patient Instructions (Signed)
Medication Instructions:  Continue same medications If you need a refill on your cardiac medications before your next appointment, please call your pharmacy.   Lab work: None ordered   Testing/Procedures: None ordered  Follow-Up: At [facility], you and your health needs are our priority.  As part of our continuing mission to provide you with exceptional heart care, we have created designated Provider Care Teams.  These Care Teams include your primary Cardiologist (physician) and Advanced Practice Providers (APPs -  Physician Assistants and Nurse Practitioners) who all work together to provide you with the care you need, when you need it. . Schedule follow up appointment with Dr.Jordan in 6 months    Call 3 months before to schedule.     

## 2019-02-23 ENCOUNTER — Other Ambulatory Visit: Payer: Self-pay

## 2019-02-23 ENCOUNTER — Ambulatory Visit (INDEPENDENT_AMBULATORY_CARE_PROVIDER_SITE_OTHER): Payer: Medicare Other | Admitting: *Deleted

## 2019-02-23 DIAGNOSIS — Z5181 Encounter for therapeutic drug level monitoring: Secondary | ICD-10-CM

## 2019-02-23 DIAGNOSIS — Z95828 Presence of other vascular implants and grafts: Secondary | ICD-10-CM

## 2019-02-23 DIAGNOSIS — Z7901 Long term (current) use of anticoagulants: Secondary | ICD-10-CM

## 2019-02-23 DIAGNOSIS — Z952 Presence of prosthetic heart valve: Secondary | ICD-10-CM | POA: Diagnosis not present

## 2019-02-23 DIAGNOSIS — I359 Nonrheumatic aortic valve disorder, unspecified: Secondary | ICD-10-CM

## 2019-02-23 DIAGNOSIS — E039 Hypothyroidism, unspecified: Secondary | ICD-10-CM

## 2019-02-23 LAB — POCT INR: INR: 3.3 — AB (ref 2.0–3.0)

## 2019-02-23 NOTE — Patient Instructions (Signed)
Description   Spoke with pt and instructed pt to continue taking 1/2 tablet daily. Recheck INR in 6 weeks per pt request.      

## 2019-03-24 ENCOUNTER — Telehealth: Payer: Self-pay

## 2019-03-24 LAB — T4, FREE: Free T4: 1.37 ng/dL (ref 0.82–1.77)

## 2019-03-24 LAB — TSH: TSH: 4.74 u[IU]/mL — ABNORMAL HIGH (ref 0.450–4.500)

## 2019-03-24 NOTE — Telephone Encounter (Signed)
Referred to ICM clinic by Gypsy Balsam, NP.   Spoke with patient and agreeable to monthly follow up.  He reported feeling fine at this time. Provided ICM number and explained should call if experiencing any fluid symptoms such as weight gain, shortness of breath or extremity/abdominal swelling.

## 2019-03-27 ENCOUNTER — Telehealth: Payer: Self-pay

## 2019-03-27 DIAGNOSIS — E039 Hypothyroidism, unspecified: Secondary | ICD-10-CM

## 2019-03-27 MED ORDER — LEVOTHYROXINE SODIUM 50 MCG PO TABS: ORAL_TABLET | ORAL | 6 refills | Status: DC

## 2019-03-27 NOTE — Telephone Encounter (Signed)
Spoke to patient lab results given.Synthroid increased to 50 mcg daily 4 days a week and 100 mcg daily 3 days a week.Repeat tsh and free t4 in 3 months.

## 2019-04-03 ENCOUNTER — Telehealth: Payer: Self-pay

## 2019-04-03 NOTE — Telephone Encounter (Signed)

## 2019-04-07 ENCOUNTER — Other Ambulatory Visit: Payer: Self-pay

## 2019-04-07 ENCOUNTER — Ambulatory Visit (INDEPENDENT_AMBULATORY_CARE_PROVIDER_SITE_OTHER): Payer: Medicare Other | Admitting: *Deleted

## 2019-04-07 DIAGNOSIS — Z952 Presence of prosthetic heart valve: Secondary | ICD-10-CM

## 2019-04-07 DIAGNOSIS — Z95828 Presence of other vascular implants and grafts: Secondary | ICD-10-CM | POA: Diagnosis not present

## 2019-04-07 DIAGNOSIS — Z5181 Encounter for therapeutic drug level monitoring: Secondary | ICD-10-CM

## 2019-04-07 DIAGNOSIS — E039 Hypothyroidism, unspecified: Secondary | ICD-10-CM

## 2019-04-07 DIAGNOSIS — Z7901 Long term (current) use of anticoagulants: Secondary | ICD-10-CM

## 2019-04-07 DIAGNOSIS — I359 Nonrheumatic aortic valve disorder, unspecified: Secondary | ICD-10-CM

## 2019-04-07 LAB — POCT INR: INR: 3.2 — AB (ref 2.0–3.0)

## 2019-04-07 NOTE — Patient Instructions (Signed)
Description   Spoke with pt and instructed pt to continue taking 1/2 tablet daily. Recheck INR in 6 weeks per pt request.

## 2019-04-10 ENCOUNTER — Other Ambulatory Visit: Payer: Self-pay

## 2019-04-10 DIAGNOSIS — E039 Hypothyroidism, unspecified: Secondary | ICD-10-CM

## 2019-04-13 ENCOUNTER — Ambulatory Visit (INDEPENDENT_AMBULATORY_CARE_PROVIDER_SITE_OTHER): Payer: Medicare Other

## 2019-04-13 DIAGNOSIS — I5022 Chronic systolic (congestive) heart failure: Secondary | ICD-10-CM | POA: Diagnosis not present

## 2019-04-13 DIAGNOSIS — Z95810 Presence of automatic (implantable) cardiac defibrillator: Secondary | ICD-10-CM | POA: Diagnosis not present

## 2019-04-14 ENCOUNTER — Ambulatory Visit (INDEPENDENT_AMBULATORY_CARE_PROVIDER_SITE_OTHER): Payer: Medicare Other | Admitting: *Deleted

## 2019-04-14 DIAGNOSIS — I420 Dilated cardiomyopathy: Secondary | ICD-10-CM

## 2019-04-14 DIAGNOSIS — I472 Ventricular tachycardia, unspecified: Secondary | ICD-10-CM

## 2019-04-14 NOTE — Progress Notes (Signed)
EPIC Encounter for ICM Monitoring  Patient Name: Jack Joseph is a 73 y.o. male Date: 04/14/2019 Primary Care Physican: [place], Betty G, MD Primary Cardiologist: [place] Electrophysiologist: Ladona Ridgel 04/14/2019 Weight: unknown        1st ICM remote transmission. Attempted call to patient and unable to reach.  Left message to return call. Transmission reviewed.    [facility] Heart Failure Index is 0 suggesting normal.  Prescribed: Spironolactone 25 mg take 0.5 tablet daily.  Recommendations: Unable to reach.    Follow-up plan: ICM clinic phone appointment on 05/18/2019.          Copy of ICM check sent to Dr. Ladona Ridgel.    8 Day Data Trend            Karie Soda, RN 04/14/2019 8:15 AM

## 2019-04-15 ENCOUNTER — Telehealth: Payer: Self-pay

## 2019-04-15 LAB — CUP PACEART REMOTE DEVICE CHECK
Date Time Interrogation Session: 20200603110327
Implantable Lead Implant Date: 20180831
Implantable Lead Location: 753860
Implantable Lead Model: 293
Implantable Lead Serial Number: 435986
Implantable Pulse Generator Implant Date: 20180831
Pulse Gen Serial Number: 240202

## 2019-04-15 NOTE — Telephone Encounter (Signed)
Remote ICM transmission received.  Attempted call to patient regarding ICM remote transmission and left message, per DPR, to return call.    

## 2019-04-22 ENCOUNTER — Encounter: Payer: Self-pay | Admitting: Cardiology

## 2019-04-22 NOTE — Progress Notes (Signed)
Remote ICD transmission.   

## 2019-05-07 ENCOUNTER — Other Ambulatory Visit: Payer: Self-pay | Admitting: *Deleted

## 2019-05-07 DIAGNOSIS — I712 Thoracic aortic aneurysm, without rupture, unspecified: Secondary | ICD-10-CM

## 2019-05-12 ENCOUNTER — Telehealth: Payer: Self-pay

## 2019-05-12 NOTE — Telephone Encounter (Signed)

## 2019-05-15 ENCOUNTER — Other Ambulatory Visit: Payer: Self-pay | Admitting: Cardiology

## 2019-05-18 ENCOUNTER — Ambulatory Visit (INDEPENDENT_AMBULATORY_CARE_PROVIDER_SITE_OTHER): Payer: Medicare Other

## 2019-05-18 DIAGNOSIS — Z95810 Presence of automatic (implantable) cardiac defibrillator: Secondary | ICD-10-CM | POA: Diagnosis not present

## 2019-05-18 DIAGNOSIS — I5022 Chronic systolic (congestive) heart failure: Secondary | ICD-10-CM

## 2019-05-19 ENCOUNTER — Other Ambulatory Visit: Payer: Self-pay

## 2019-05-19 ENCOUNTER — Ambulatory Visit (INDEPENDENT_AMBULATORY_CARE_PROVIDER_SITE_OTHER): Payer: Medicare Other | Admitting: *Deleted

## 2019-05-19 ENCOUNTER — Telehealth: Payer: Self-pay

## 2019-05-19 DIAGNOSIS — Z7901 Long term (current) use of anticoagulants: Secondary | ICD-10-CM

## 2019-05-19 DIAGNOSIS — Z952 Presence of prosthetic heart valve: Secondary | ICD-10-CM | POA: Diagnosis not present

## 2019-05-19 DIAGNOSIS — Z95828 Presence of other vascular implants and grafts: Secondary | ICD-10-CM

## 2019-05-19 DIAGNOSIS — Z5181 Encounter for therapeutic drug level monitoring: Secondary | ICD-10-CM

## 2019-05-19 DIAGNOSIS — I359 Nonrheumatic aortic valve disorder, unspecified: Secondary | ICD-10-CM | POA: Diagnosis not present

## 2019-05-19 DIAGNOSIS — E039 Hypothyroidism, unspecified: Secondary | ICD-10-CM | POA: Diagnosis not present

## 2019-05-19 LAB — POCT INR: INR: 2.7 (ref 2.0–3.0)

## 2019-05-19 NOTE — Patient Instructions (Signed)
Description    Continue taking 1/2 tablet daily. Recheck INR in 6 weeks per pt request. Coumadin Clinic [phone]

## 2019-05-19 NOTE — Progress Notes (Signed)
EPIC Encounter for ICM Monitoring  Patient Name: Jack Joseph is a 73 y.o. male Date: 05/19/2019 Primary Care Physican: [place], Betty G, MD Primary Cardiologist: [place] Electrophysiologist: Lovena Le 04/14/2019 Weight: unknown       Attempted call to patient and unable to reach.  Left detailed message per DPR regarding transmission. Transmission reviewed.    05/17/2019 [facility] Heart Failure Index is 13 which is within normal threshold range.  Prescribed: Spironolactone 25 mg take 0.5 tablet daily.  Recommendations: Unable to reach.    Follow-up plan: ICM clinic phone appointment on 06/22/2019.          Copy of ICM check sent to Dr. Lovena Le.         Rosalene Billings, RN 05/19/2019 11:28 AM

## 2019-05-19 NOTE — Telephone Encounter (Signed)
Remote ICM transmission received.  Attempted call to patient regarding ICM remote transmission and left detailed message, per DPR, with next ICM remote transmission date of 06/22/2019.  Advised to return call for any fluid symptoms or questions.    

## 2019-06-22 ENCOUNTER — Ambulatory Visit (INDEPENDENT_AMBULATORY_CARE_PROVIDER_SITE_OTHER): Payer: Medicare Other

## 2019-06-22 DIAGNOSIS — I5022 Chronic systolic (congestive) heart failure: Secondary | ICD-10-CM

## 2019-06-22 DIAGNOSIS — Z95810 Presence of automatic (implantable) cardiac defibrillator: Secondary | ICD-10-CM

## 2019-06-23 ENCOUNTER — Telehealth: Payer: Self-pay

## 2019-06-23 NOTE — Telephone Encounter (Signed)
Remote ICM transmission received.  Attempted call to patient regarding ICM remote transmission and person answering phone hung up after asking to speak with patient.

## 2019-06-23 NOTE — Progress Notes (Signed)
EPIC Encounter for ICM Monitoring  Patient Name: Jack Joseph is a 73 y.o. male Date: 06/23/2019 Primary Care Physican: [place], Betty G, MD Primary Cardiologist:Jordan Electrophysiologist:Taylor 6/2/2020Weight: unknown                                                               Attempted call to patient and unable to reach.  Left detailed message per DPR regarding transmission. Transmission reviewed.    06/21/2019 [facility] Heart Failure Index is 6 which is within normal threshold range.  Prescribed: Spironolactone 25 mg take 0.5 tablet daily.  Recommendations:Unable to reach.  Follow-up plan: ICM clinic phone appointment on10/10/2019. OV with Dr Martinique 08/24/2019.  Copy of ICM check sent to Dr. Lovena Le.    Rosalene Billings, RN 06/23/2019 9:13 AM

## 2019-06-29 LAB — T4, FREE: Free T4: 1.7 ng/dL (ref 0.82–1.77)

## 2019-06-29 LAB — TSH: TSH: 4.14 u[IU]/mL (ref 0.450–4.500)

## 2019-06-30 ENCOUNTER — Other Ambulatory Visit: Payer: Self-pay

## 2019-06-30 ENCOUNTER — Ambulatory Visit (INDEPENDENT_AMBULATORY_CARE_PROVIDER_SITE_OTHER): Payer: Medicare Other | Admitting: *Deleted

## 2019-06-30 DIAGNOSIS — Z952 Presence of prosthetic heart valve: Secondary | ICD-10-CM

## 2019-06-30 DIAGNOSIS — Z5181 Encounter for therapeutic drug level monitoring: Secondary | ICD-10-CM | POA: Diagnosis not present

## 2019-06-30 DIAGNOSIS — Z7901 Long term (current) use of anticoagulants: Secondary | ICD-10-CM | POA: Diagnosis not present

## 2019-06-30 DIAGNOSIS — I359 Nonrheumatic aortic valve disorder, unspecified: Secondary | ICD-10-CM

## 2019-06-30 DIAGNOSIS — Z95828 Presence of other vascular implants and grafts: Secondary | ICD-10-CM

## 2019-06-30 DIAGNOSIS — E039 Hypothyroidism, unspecified: Secondary | ICD-10-CM

## 2019-06-30 LAB — POCT INR: INR: 2.4 (ref 2.0–3.0)

## 2019-06-30 NOTE — Patient Instructions (Signed)
Description   Today take 1 tablet then continue taking 1/2 tablet daily. Recheck INR in 6 weeks. Coumadin Clinic [phone]

## 2019-07-09 ENCOUNTER — Other Ambulatory Visit: Payer: Self-pay | Admitting: Cardiothoracic Surgery

## 2019-07-09 ENCOUNTER — Ambulatory Visit (INDEPENDENT_AMBULATORY_CARE_PROVIDER_SITE_OTHER): Payer: Medicare Other | Admitting: Cardiothoracic Surgery

## 2019-07-09 ENCOUNTER — Other Ambulatory Visit: Payer: Self-pay

## 2019-07-09 ENCOUNTER — Encounter: Payer: Self-pay | Admitting: Cardiothoracic Surgery

## 2019-07-09 ENCOUNTER — Ambulatory Visit
Admission: RE | Admit: 2019-07-09 | Discharge: 2019-07-09 | Disposition: A | Discharge status: Home / Self Care | Payer: Medicare Other | Source: Ambulatory Visit | Attending: Cardiothoracic Surgery | Admitting: Cardiothoracic Surgery

## 2019-07-09 VITALS — BP 126/68 | HR 68 | Temp 97.5°F | Resp 16 | Ht 70.0 in | Wt 145.0 lb

## 2019-07-09 DIAGNOSIS — I712 Thoracic aortic aneurysm, without rupture, unspecified: Secondary | ICD-10-CM

## 2019-07-09 DIAGNOSIS — Z95828 Presence of other vascular implants and grafts: Secondary | ICD-10-CM | POA: Diagnosis not present

## 2019-07-09 DIAGNOSIS — Q859 Phakomatosis, unspecified: Secondary | ICD-10-CM | POA: Diagnosis not present

## 2019-07-09 DIAGNOSIS — Z952 Presence of prosthetic heart valve: Secondary | ICD-10-CM | POA: Diagnosis not present

## 2019-07-09 MED ORDER — IOPAMIDOL (ISOVUE-370) INJECTION 76%: 75.0000 mL | Freq: Once | INTRAVENOUS | Status: DC | PRN

## 2019-07-09 NOTE — Progress Notes (Signed)
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 POSTOPERATIVE DIAGNOSIS:  Status post mechanical aortic valve replacement in 1983, now with 6.3 to 6.5 cm ascending aortic dilatation with left ventricular dysfunction. SURGICAL PROCEDURES:  Replacement of ascending aorta/Wheat procedure with hypothermic circulatory arrest and cardiopulmonary bypass with a 32 mm Dacron Hemashield woven graft.  History of Present Illness:      Patient is without new complains comes in to review follow up CT of chest after aortic surgery and evaluation of left lung mass .  His mechanical valve has now been in place for 37 years, he is now 2 years status post replacement of his ascending aorta.  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  He denies any signs or symptoms of congestive heart failure or angina.  Continues to manage his Coumadin appropriately, notes the last INR was 2.4.    Past Medical History:  Diagnosis Date   AVD (aortic valve disease)    Chronic anticoagulation    Dilated cardiomyopathy (HCC)    EF 30-35%   Dysrhythmia    Hyperlipidemia    LV dysfunction      Social History   Tobacco Use  Smoking Status Former Smoker   Quit date: 11/12/2000   Years since quitting: 18.6  Smokeless Tobacco Never Used    Social History   Substance and Sexual Activity  Alcohol Use No     Allergies  Allergen Reactions   Morphine And Related Other (See Comments)    Hallucinations     Current Outpatient  Medications  Medication Sig Dispense Refill   amiodarone (PACERONE) 200 MG tablet TAKE ONE TABLET BY MOUTH DAILY 90 tablet 1   levothyroxine (SYNTHROID) 50 MCG tablet Take 50 mcg ( 1 tablet ) daily 4 days a week and 100 mcg ( 2 tablets ) daily 3 days a week 40 tablet 6   losartan (COZAAR) 25 MG tablet TAKE ONE-HALF TABLET BY MOUTH DAILY 45 tablet 1   spironolactone (ALDACTONE) 25 MG tablet TAKE ONE-HALF TABLET BY MOUTH DAILY 45 tablet 5   warfarin (COUMADIN) 5 MG tablet TAKE 1/2 TO 1 (ONE-HALF TO ONE) TABLET BY MOUTH ONCE DAILY OR AS DIRECTED BY THE COUMADIN CLINIC 90 tablet 0   No current facility-administered medications for this visit.        Physical Exam: BP 126/68 (BP Location: Right Arm, Patient Position: Sitting, Cuff Size: Normal)    Pulse 68    Temp (!) 97.5 F (36.4 C)    Resp 16    Ht 5\' 10"  (1.778 m)    Wt 145 lb (65.8 kg)    SpO2 95% Comment: RA   BMI 20.81 kg/m  General appearance: alert, cooperative, appears stated age and no distress Neck: no adenopathy, no carotid bruit, no JVD, supple, symmetrical, trachea midline and thyroid not enlarged, symmetric, no tenderness/mass/nodules Resp: clear to auscultation bilaterally Cardio: regular rate and rhythm, S1, S2 normal, no  murmur, click, rub or gallop GI: soft, non-tender; bowel sounds normal; no masses,  no organomegaly Extremities: extremities normal, atraumatic, no cyanosis or edema and Homans sign is negative, no sign of DVT Neurologic: Grossly normal   Diagnostic Studies & Laboratory data:     Recent Radiology Findings:  Ct Chest Wo Contrast  Result Date: 07/09/2019 CLINICAL DATA:  Follow-up TAA EXAM: CT CHEST WITHOUT CONTRAST TECHNIQUE: Multidetector CT imaging of the chest was performed following the standard protocol without IV contrast. COMPARISON:  07/03/2018, 10/31/2017, 05/07/2017 FINDINGS: Cardiovascular: Redemonstrated postoperative findings of Bentall type aortic valve and ascending aortic graft repair.  Atherosclerosis of the native aorta, which is tortuous although non dilated, maximum caliber of the descending thoracic aorta 2.8 cm. Normal heart size. Extensive 3 vessel coronary artery calcifications and/or stents. No pericardial effusion. Mediastinum/Nodes: No enlarged mediastinal, hilar, or axillary lymph nodes. 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 Thyroid gland, trachea, and esophagus demonstrate no significant findings. Lungs/Pleura: Moderate centrilobular emphysema. Dendriform calcifications and mucoid impaction in the left lung base. No pleural effusion or pneumothorax. Upper Abdomen: No acute abnormality. Musculoskeletal: No chest wall mass or suspicious bone lesions identified. IMPRESSION: 1. Redemonstrated postoperative findings of Bentall type aortic valve and ascending aortic graft repair. Atherosclerosis of the native aorta, which is tortuous although non dilated, maximum caliber of the descending thoracic aorta 2.8 cm. 2.  Coronary artery disease. 3.  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 4.  Emphysema. Electronically Signed   By: Lauralyn Primes M.D.   On: 07/09/2019 16:18   Ct Angio Chest Aorta W/cm &/or Wo/cm  Result Date: 07/03/2018 CLINICAL DATA:  Thoracic aortic aneurysm EXAM: CT ANGIOGRAPHY CHEST WITH CONTRAST TECHNIQUE: Multidetector CT imaging of the chest was performed using the standard protocol during bolus administration of intravenous contrast. Multiplanar CT image reconstructions and MIPs were obtained to evaluate the vascular anatomy. CONTRAST:  82mL ISOVUE-370 IOPAMIDOL (ISOVUE-370) INJECTION 76% COMPARISON:  10/31/2017 FINDINGS: Cardiovascular: Postoperative changes from aortic valve and root replacement are again noted. Maximal diameter of the ascending aorta is 3.4 cm. Atherosclerotic calcifications in the aortic arch are noted. Maximal diameter of the proximal aortic arch is 3.4 cm. Great vessels are grossly  patent. There is no evidence of pseudoaneurysm. Three vessel coronary artery calcifications. AICD device remains in place. Mediastinum/Nodes: No abnormal mediastinal adenopathy. No pericardial effusion. Thyroid remains heterogeneous. Esophagus is within normal limits. Lungs/Pleura: Central fat containing left lung mass is stable at 3.2 cm. No pneumothorax or pleural effusion. Scattered scarring in the lungs is stable. Calcifications in the lateral basal segment of the left lower lobe are likely related to old granulomatous disease. Upper Abdomen: No acute abnormality. Musculoskeletal: No vertebral compression deformity. Review of the MIP images confirms the above findings. IMPRESSION: Stable appearance of aortic valve and ascending aortic graft repair without evidence of pseudoaneurysm or contrast extravasation. Stable central left lung hamartoma. Aortic Atherosclerosis (ICD10-I70.0). Electronically Signed   By: Jolaine Click M.D.   On: 07/03/2018 09:49   I have independently reviewed the above radiology studies  and reviewed the findings with the patient.     Ct Angio Chest Aorta W/cm &/or Wo/cm  Result Date: 11/01/2017 CLINICAL DATA:  Status post Wheat procedure on 04/30/2017 to treat aneurysmal disease of the ascending thoracic aorta. EXAM: CT ANGIOGRAPHY CHEST WITH CONTRAST TECHNIQUE: Multidetector CT imaging of the chest was performed using the standard protocol during bolus administration of intravenous contrast. Multiplanar CT image reconstructions and MIPs were  obtained to evaluate the vascular anatomy. CONTRAST:  28mL ISOVUE-370 IOPAMIDOL (ISOVUE-370) INJECTION 76% COMPARISON:  02/01/2017, post- operative CT of the chest without contrast on 05/07/2017 and chest x-ray on 07/13/2017 FINDINGS: Cardiovascular: Initial unenhanced CT demonstrates high attenuation supracoronary synthetic aortic graft extending from the aortic root to the proximal arch. Arterial phase post-contrast imaging shows normal  patency of the graft with normal appearance of proximal and distal anastomoses and no evidence of pseudoaneurysm or residual aneurysmal disease. The aortic root measures approximately 3.6 cm. The graft measures approximately 3.4 cm. Native arch measures approximately 3.2 cm. The descending thoracic aorta measures approximately 2.4 cm. No evidence of aortic dissection. Proximal great vessels show stable mild atherosclerosis without stenosis. Stable normal variant separate origin of the left vertebral artery off of the aortic arch. Stable CT appearance of mechanical aortic valve. Stable heart size without evidence of pericardial fluid. Calcified plaque again noted in the coronary arteries. Single lead ICD extends to the right ventricular apex. Normal caliber central pulmonary arteries. Mediastinum/Nodes: No mediastinal, hilar or axillary lymphadenopathy. No pneumomediastinum or mediastinal fluid collections. Central airways are normally patent. Lungs/Pleura: Lung showed no evidence of pneumothorax, edema or airspace consolidation. Minimal scarring/atelectasis present at the lung bases bilaterally. No pleural effusions. Stable fat containing central lung mass in the left superior hilar region measuring 3.2 cm in greatest diameter and consistent with hamartoma. Upper Abdomen: Stable upper pole left renal cyst has a benign appearance. 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 No abnormalities identified by CT at the level of median sternotomy. Review of the MIP images confirms the above findings. IMPRESSION: 1. 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 The graft appears normally patent with no evidence of residual aneurysmal disease. Previously placed mechanical aortic valve again noted. No evidence of aortic dissection, graft pseudoaneurysm or abnormal mediastinal fluid or air. 2. Stable 3.2 cm fat  containing hamartoma in the left central lung at the level of the superior hilum. Electronically Signed   By: Aletta Edouard M.D.   On: 11/01/2017 09:34      Recent Lab Findings: Lab Results  Component Value Date   WBC 7.6 03/18/2018   HGB 13.3 03/18/2018   HCT 40.3 03/18/2018   PLT 264 03/18/2018   GLUCOSE 78 09/05/2018   CHOL 192 09/05/2018   TRIG 62 09/05/2018   HDL 71 09/05/2018   LDLCALC 109 (H) 09/05/2018   ALT 12 09/05/2018   AST 25 09/05/2018   NA 134 09/05/2018   K 4.8 09/05/2018   CL 95 (L) 09/05/2018   CREATININE 1.68 (H) 09/05/2018   BUN 21 09/05/2018   CO2 24 09/05/2018   TSH 4.140 06/29/2019   INR 2.4 06/30/2019   HGBA1C 5.7 (H) 04/25/2017   Chronic Kidney Disease   Stage I     GFR >90  Stage II    GFR 60-89  Stage IIIA GFR 45-59  Stage IIIB GFR 30-44  Stage IV   GFR 15-29  Stage V    GFR  <15  Lab Results  Component Value Date   CREATININE 1.68 (H) 09/05/2018   CrCl cannot be calculated (Patient's most recent lab result is older than the maximum 21 days allowed.).    Assessment / Plan:   1/s/p replacement of ascending aorta for 6.4 ascending aortic aneurysm s/p mechanical AVR 37  years ago.The graft appears normally patent with no evidence of residual aneurysmal disease 2/table 3.2 cm fat  containing hamartoma unchanged since previous scan 8 months ago.  Plan CT of the chest without contrast  in 1 year-to evaluate the left lung mass and ascending aorta.   Delight OvensEdward B Emmalise Huard MD      [address] [place] [id number] Office [phone]   Beeper [phone]  07/09/2019 4:54 PM

## 2019-07-10 ENCOUNTER — Other Ambulatory Visit: Payer: Self-pay | Admitting: Cardiology

## 2019-07-10 NOTE — Telephone Encounter (Signed)
Please review for refill. Thank you! 

## 2019-07-14 ENCOUNTER — Ambulatory Visit (INDEPENDENT_AMBULATORY_CARE_PROVIDER_SITE_OTHER): Payer: Medicare Other | Admitting: *Deleted

## 2019-07-14 DIAGNOSIS — I472 Ventricular tachycardia, unspecified: Secondary | ICD-10-CM

## 2019-07-14 DIAGNOSIS — I5042 Chronic combined systolic (congestive) and diastolic (congestive) heart failure: Secondary | ICD-10-CM

## 2019-07-20 LAB — CUP PACEART REMOTE DEVICE CHECK
Date Time Interrogation Session: 20200907164800
Implantable Lead Implant Date: 20180831
Implantable Lead Location: 753860
Implantable Lead Model: 293
Implantable Lead Serial Number: 435986
Implantable Pulse Generator Implant Date: 20180831
Pulse Gen Serial Number: 240202

## 2019-07-28 NOTE — Progress Notes (Signed)
Remote ICD transmission.   

## 2019-08-11 ENCOUNTER — Ambulatory Visit (INDEPENDENT_AMBULATORY_CARE_PROVIDER_SITE_OTHER): Payer: Medicare Other | Admitting: *Deleted

## 2019-08-11 ENCOUNTER — Other Ambulatory Visit: Payer: Self-pay

## 2019-08-11 DIAGNOSIS — Z7901 Long term (current) use of anticoagulants: Secondary | ICD-10-CM

## 2019-08-11 DIAGNOSIS — Z5181 Encounter for therapeutic drug level monitoring: Secondary | ICD-10-CM | POA: Diagnosis not present

## 2019-08-11 DIAGNOSIS — E039 Hypothyroidism, unspecified: Secondary | ICD-10-CM

## 2019-08-11 DIAGNOSIS — Z95828 Presence of other vascular implants and grafts: Secondary | ICD-10-CM | POA: Diagnosis not present

## 2019-08-11 DIAGNOSIS — I359 Nonrheumatic aortic valve disorder, unspecified: Secondary | ICD-10-CM | POA: Diagnosis not present

## 2019-08-11 DIAGNOSIS — Z952 Presence of prosthetic heart valve: Secondary | ICD-10-CM | POA: Diagnosis not present

## 2019-08-11 LAB — POCT INR: INR: 2.3 (ref 2.0–3.0)

## 2019-08-11 NOTE — Patient Instructions (Signed)
Description   Today take 1 tablet then start taking 1/2 tablet daily except 1 tablet on Tuesdays. Recheck INR in 3 weeks with MD appt. [facility] [phone]

## 2019-08-22 NOTE — Progress Notes (Signed)
[name] Date of Birth: [birth year]   History of Present Illness: Smitty Cords is seen today for post hospital follow up.  He is status post aortic valve replacement with a Bjork Shiley valve in [place] in [place]. He is on chronic Coumadin therapy. He has a history of dilated cardiomyopathy with prior ejection fraction of 30-35%.   In 2018  CXR showed a left hilar rounded mass and CT was recommended. 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 An Echo was also obtained showing normal functioning of his prosthetic AV. It confirmed the ascending aortic aneurysm. LV function was severely reduced with EF 25-30%. There was mild pulmonary HTN. Cardiac cath showed mild nonobstructive CAD with normal right heart pressures and cardiac output. The prosthetic AV disc moved well on fluoro.   On 04/29/17 he underwent a Wheat procedure with replacement of his ascending aorta by Dr. Tyrone Sage. The prior AV prosthesis was left in place. His post op course was complicated. He was initially extubated on the evening of surgery and inotropes were weaned. On the next day he developed pulseless VT with cardiac arrest. He required cardioversion, CPR, and reintubation. He was loaded on IV amiodarone. He developed a pneumothorax and had a chest tube placed. On POD #3 he was once again extubated. He developed subcutaneous emphysema on the right and had a second chest tube placed. He required reintubation again due to mucus plugging. On POD#6 he was extubated again. On POD #8 his subcutaneous emphysema worsened and his right chest tube had to be replaced. He required transfusion on 2 occasions for anemia. On POD #8 he had increased atelectasis. He had bronchoscopy. Breathing again worsened and he was reintubated. He required TPN for nutritional support and IV pressors for BP support. He improved and was able to be extubated on POD #13 and  chest tubes removed on POD #16. Diet was advanced and he was transitioned to coumadin. He also developed diarrhea and was C. Diff positive. This was treated. On July 12 he was transferred to inpatient Rehab for PT/OT. He continued to have SOB and hypoxia treated with lasix and Xopenex. This improved and he was weaned off oxygen. He was DC home on July 20 on coumadin, amiodarone, and losartan. Lifevest was ordered. Echo on July 9 showed EF 20-25%. Dr. Ladona Ridgel recommended consideration for ICD implant and this was done on 07/12/17. Amiodarone dose decreased to 200 mg daily except none on Sunday. Seen in follow up with EP in December and recommended continued current therapy.   Last ICD check on 07/18/19 showed some NSVT- Normal device function.  On follow up today he is is doing well. He only notes dyspnea when he over exercises. He has a chronic cough and sinus drainage. No palpitations, chest pain or dizziness. Appetite is good. He sleeps 1-12 hours/night.    Current Outpatient Medications on File Prior to Visit  Medication Sig Dispense Refill  . amiodarone (PACERONE) 200 MG tablet TAKE ONE TABLET BY MOUTH DAILY 90 tablet 1  . levothyroxine (SYNTHROID) 50 MCG tablet Take 50 mcg ( 1 tablet ) daily 4 days a week and 100 mcg ( 2 tablets ) daily 3 days a week 40 tablet 6  . losartan (COZAAR) 25 MG tablet TAKE ONE-HALF TABLET BY MOUTH DAILY 45 tablet 1  . spironolactone (ALDACTONE) 25 MG tablet TAKE ONE-HALF TABLET BY MOUTH DAILY 45 tablet 5  .  warfarin (COUMADIN) 5 MG tablet TAKE 1/2 TO 1 TABLET DAILY AS DIRECTED BY COUMADIN CLINIC 90 tablet 0   No current facility-administered medications on file prior to visit.     Allergies  Allergen Reactions  . Morphine And Related Other (See Comments)    Hallucinations     Past Medical History:  Diagnosis Date  . AVD (aortic valve disease)   . Chronic anticoagulation   . Dilated cardiomyopathy (HCC)    EF 30-35%  . Dysrhythmia   . Hyperlipidemia   . LV  dysfunction     Past Surgical History:  Procedure Laterality Date  . AORTIC VALVE REPLACEMENT    . BENTALL PROCEDURE N/A 04/29/2017   Procedure: REPLACEMENT ASCENDING AORTA/ WHEAT PROCEDURE, HYPOTHERMIC CIRCULATORY ARREST AND CARDIOPULMONARY BYPASS, AND USE OF HEMASHIELD PLATINUM WOVEN DOUBLE VELOUR VASCULAR GRAFT 32 MM X 50 CM;  Surgeon: [name], MD;  Location: [facility];  Service: Open Heart Surgery;  Laterality: N/A;  . DOPPLER ECHOCARDIOGRAPHY  04/07/2002   EF 30-35%  . ICD IMPLANT N/A 07/12/2017   Procedure: ICD Implant;  Surgeon: Evans Lance, MD;  Location: [facility];  Service: Cardiovascular;  Laterality: N/A;  . RIGHT/LEFT HEART CATH AND CORONARY ANGIOGRAPHY N/A 03/05/2017   Procedure: Right/Left Heart Cath and Coronary Angiography;  Surgeon: Ival Pacer M Martinique, MD;  Location: [facility];  Service: Cardiovascular;  Laterality: N/A;  . TEE WITHOUT CARDIOVERSION N/A 04/29/2017   Procedure: TRANSESOPHAGEAL ECHOCARDIOGRAM (TEE);  Surgeon: [name], MD;  Location: [facility];  Service: Open Heart Surgery;  Laterality: N/A;    Social History   Tobacco Use  Smoking Status Former Smoker  . Quit date: 11/12/2000  . Years since quitting: 18.7  Smokeless Tobacco Never Used    Social History   Substance and Sexual Activity  Alcohol Use No    Family History  Problem Relation Age of Onset  . Heart disease Brother     Review of Systems: As noted in history of present illness.  All other systems were reviewed and are negative.  Physical Exam: BP 130/66 (BP Location: Left Arm, Patient Position: Sitting, Cuff Size: Normal)   Pulse 70   Temp (!) 97.2 F (36.2 C)   Ht 5\' 10"  (1.778 m)   Wt 148 lb (67.1 kg)   BMI 21.24 kg/m  GENERAL:  Well appearing, thin WM in NAD HEENT:  PERRL, EOMI, sclera are clear. Oropharynx is clear. NECK:  No jugular venous distention, carotid upstroke brisk and symmetric, no bruits, no thyromegaly or adenopathy LUNGS:  Clear to  auscultation bilaterally CHEST:  Unremarkable HEART:  RRR,  PMI not displaced or sustained,good mechanical AV click. no S3, no S4: no clicks, no rubs, no murmurs ABD:  Soft, nontender. BS +, no masses or bruits. No hepatomegaly, no splenomegaly EXT:  2 + pulses throughout, no  ankle edema, no cyanosis no clubbing. Hand is mildly swollen and tender where splinter removed. SKIN:  Warm and dry.  No rashes NEURO:  Alert and oriented x 3. Cranial nerves II through XII intact. PSYCH:  Cognitively intact     LABORATORY DATA:  Lab Results  Component Value Date   WBC 7.6 03/18/2018   HGB 13.3 03/18/2018   HCT 40.3 03/18/2018   PLT 264 03/18/2018   GLUCOSE 78 09/05/2018   CHOL 192 09/05/2018   TRIG 62 09/05/2018   HDL 71 09/05/2018   LDLCALC 109 (H) 09/05/2018   ALT 12 09/05/2018   AST  25 09/05/2018   NA 134 09/05/2018   K 4.8 09/05/2018   CL 95 (L) 09/05/2018   CREATININE 1.68 (H) 09/05/2018   BUN 21 09/05/2018   CO2 24 09/05/2018   TSH 4.140 06/29/2019   PSA 0.83 09/16/2017   INR 2.3 08/11/2019   HGBA1C 5.7 (H) 04/25/2017   Ecg today shows NSR with LBBB, LAD. I have personally reviewed and interpreted this study.   Echo 05/20/17:Study Conclusions  - Procedure narrative: Transthoracic echocardiography. Image   quality was poor. The study was technically difficult, as a   result of poor sound wave transmission. - Left ventricle: The cavity size was mildly dilated. Wall   thickness was normal. Systolic function was severely reduced. The   estimated ejection fraction was in the range of 20% to 25%.   Diffuse hypokinesis. Features are consistent with a pseudonormal   left ventricular filling pattern, with concomitant abnormal   relaxation and increased filling pressure (grade 2 diastolic   dysfunction). - Aortic valve: A mechanical prosthesis was present. - Mitral valve: There was mild regurgitation. - Pericardium, extracardiac: There was a left pleural  effusion.  Impressions:  - Severe global reduction in LV systolic function; moderate   diastolic dysfunction; mild LVE; s/p AVR with normal mean   gradient; mildly dilated aortic root; mild MR.   Assessment / Plan: 1. Status post remote aortic valve replacement with a Bjork Shiley mechanical prosthesis. He is on chronic Coumadin therapy.  Last INR 2.3. Pharmacy following.   SBE prophylaxis.   2. Chronic systolic CHF/Dilated cardiomyopathy. Ejection fraction of 20-25% post Aortic aneurysm repair.  Patient is class 1-2. Only on losartan and aldactone at low doses. Unable to titrate due to low BP.   3. Thoracic aortic aneurysm. Ascending aorta. 6.3 cm.  Now s/p Wheat procedure with replacement. 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 Post op course very complicated due to development of VT, acute respiratory failure, subQ emphysema, multiple chest tubes, anemia, and deconditioning. He clinically is improved.  4. VT following Aortic aneurysm surgery. Low EF. S/p ICD implant. Continue amiodarone 5 days/week. Need to check TFTS and chemistries today.   5. Abdominal bruit. [place] negative for AAA.  Borderline iliac disease.  6. Hypothyroidism. Thyroid dose increased in August. On 50 micrograms. Will follow up TSH and free T4 today.  Follow up in 6 months.

## 2019-08-24 ENCOUNTER — Ambulatory Visit (INDEPENDENT_AMBULATORY_CARE_PROVIDER_SITE_OTHER): Payer: Medicare Other

## 2019-08-24 ENCOUNTER — Ambulatory Visit (INDEPENDENT_AMBULATORY_CARE_PROVIDER_SITE_OTHER): Payer: Medicare Other | Admitting: Cardiology

## 2019-08-24 ENCOUNTER — Other Ambulatory Visit: Payer: Self-pay

## 2019-08-24 ENCOUNTER — Encounter: Payer: Self-pay | Admitting: Cardiology

## 2019-08-24 VITALS — BP 130/66 | HR 70 | Temp 97.2°F | Ht 70.0 in | Wt 148.0 lb

## 2019-08-24 DIAGNOSIS — I472 Ventricular tachycardia, unspecified: Secondary | ICD-10-CM

## 2019-08-24 DIAGNOSIS — Z95828 Presence of other vascular implants and grafts: Secondary | ICD-10-CM | POA: Diagnosis not present

## 2019-08-24 DIAGNOSIS — I5042 Chronic combined systolic (congestive) and diastolic (congestive) heart failure: Secondary | ICD-10-CM | POA: Diagnosis not present

## 2019-08-24 DIAGNOSIS — I359 Nonrheumatic aortic valve disorder, unspecified: Secondary | ICD-10-CM | POA: Diagnosis not present

## 2019-08-24 DIAGNOSIS — E039 Hypothyroidism, unspecified: Secondary | ICD-10-CM | POA: Diagnosis not present

## 2019-08-24 DIAGNOSIS — Z95810 Presence of automatic (implantable) cardiac defibrillator: Secondary | ICD-10-CM | POA: Diagnosis not present

## 2019-08-24 DIAGNOSIS — I5022 Chronic systolic (congestive) heart failure: Secondary | ICD-10-CM | POA: Diagnosis not present

## 2019-08-24 NOTE — Progress Notes (Signed)
EPIC Encounter for ICM Monitoring  Patient Name: Jack Joseph is a 73 y.o. male Date: 08/24/2019 Primary Care Physican: Martinique, Betty G, MD Primary Cardiologist:Jordan Electrophysiologist:Taylor 10/12/2020Weight: [phone]  Spoke with patient and he is doing fine.  08/24/2019 [employer] Heart Failure Index is11which is within normal threshold range.  Prescribed:Spironolactone 25 mg take 0.5 tablet daily.  Recommendations: No changes.  Encouraged to call for fluid symptoms.  Follow-up plan: ICM clinic phone appointment on 10/14/2019.  91 day device clinic remote transmission 10/13/2019.  Office appointment scheduled 08/24/2019 with Dr. Martinique.  OV with Dr Lovena Le 09/01/2019.        Copy of ICM check sent to Dr. Lovena Le and Dr Martinique.     [place], RN 08/24/2019 11:11 AM

## 2019-08-25 ENCOUNTER — Telehealth: Payer: Self-pay

## 2019-08-25 DIAGNOSIS — E875 Hyperkalemia: Secondary | ICD-10-CM

## 2019-08-25 DIAGNOSIS — E785 Hyperlipidemia, unspecified: Secondary | ICD-10-CM

## 2019-08-25 DIAGNOSIS — I10 Essential (primary) hypertension: Secondary | ICD-10-CM

## 2019-08-25 LAB — TSH: TSH: 3.7 u[IU]/mL (ref 0.450–4.500)

## 2019-08-25 LAB — LIPID PANEL
Chol/HDL Ratio: 3.6 ratio (ref 0.0–5.0)
Cholesterol, Total: 226 mg/dL — ABNORMAL HIGH (ref 100–199)
HDL: 62 mg/dL (ref >39)
LDL Chol Calc (NIH): 135 mg/dL — ABNORMAL HIGH (ref 0–99)
Triglycerides: 164 mg/dL — ABNORMAL HIGH (ref 0–149)
VLDL Cholesterol Cal: 29 mg/dL (ref 5–40)

## 2019-08-25 LAB — CBC WITH DIFFERENTIAL/PLATELET
Basophils Absolute: 0.1 10*3/uL (ref 0.0–0.2)
Basos: 1 %
EOS (ABSOLUTE): 0.1 10*3/uL (ref 0.0–0.4)
Eos: 1 %
Hematocrit: 39.5 % (ref 37.5–51.0)
Hemoglobin: 13.6 g/dL (ref 13.0–17.7)
Immature Grans (Abs): 0 10*3/uL (ref 0.0–0.1)
Immature Granulocytes: 0 %
Lymphocytes Absolute: 1.6 10*3/uL (ref 0.7–3.1)
Lymphs: 20 %
MCH: 32.2 pg (ref 26.6–33.0)
MCHC: 34.4 g/dL (ref 31.5–35.7)
MCV: 93 fL (ref 79–97)
Monocytes Absolute: 1.1 10*3/uL — ABNORMAL HIGH (ref 0.1–0.9)
Monocytes: 13 %
Neutrophils Absolute: 5.2 10*3/uL (ref 1.4–7.0)
Neutrophils: 65 %
Platelets: 222 10*3/uL (ref 150–450)
RBC: 4.23 x10E6/uL (ref 4.14–5.80)
RDW: 12.8 % (ref 11.6–15.4)
WBC: 8 10*3/uL (ref 3.4–10.8)

## 2019-08-25 LAB — BASIC METABOLIC PANEL
BUN/Creatinine Ratio: 14 (ref 10–24)
BUN: 23 mg/dL (ref 8–27)
CO2: 24 mmol/L (ref 20–29)
Calcium: 9.6 mg/dL (ref 8.6–10.2)
Chloride: 99 mmol/L (ref 96–106)
Creatinine, Ser: 1.66 mg/dL — ABNORMAL HIGH (ref 0.76–1.27)
GFR calc Af Amer: 46 mL/min/{1.73_m2} — ABNORMAL LOW (ref >59)
GFR calc non Af Amer: 40 mL/min/{1.73_m2} — ABNORMAL LOW (ref >59)
Glucose: 69 mg/dL (ref 65–99)
Potassium: 5.9 mmol/L (ref 3.5–5.2)
Sodium: 137 mmol/L (ref 134–144)

## 2019-08-25 LAB — HEPATIC FUNCTION PANEL
ALT: 13 IU/L (ref 0–44)
AST: 23 IU/L (ref 0–40)
Albumin: 4.5 g/dL (ref 3.7–4.7)
Alkaline Phosphatase: 69 IU/L (ref 39–117)
Bilirubin Total: 0.4 mg/dL (ref 0.0–1.2)
Bilirubin, Direct: 0.13 mg/dL (ref 0.00–0.40)
Total Protein: 7.3 g/dL (ref 6.0–8.5)

## 2019-08-25 LAB — T4, FREE: Free T4: 1.75 ng/dL (ref 0.82–1.77)

## 2019-08-25 MED ORDER — ROSUVASTATIN CALCIUM 10 MG PO TABS: 10.0000 mg | ORAL_TABLET | Freq: Every day | ORAL | 3 refills | Status: DC

## 2019-08-25 NOTE — Telephone Encounter (Signed)
Spoke to patient lab results given.Advised to stop Aldactone.Start Crestor 10 mg daily.Repeat bmet in 2 weeks.Repeat cmet,lipid panel in 3 months.Lab orders mailed.

## 2019-08-26 ENCOUNTER — Other Ambulatory Visit: Payer: Self-pay

## 2019-08-26 DIAGNOSIS — I10 Essential (primary) hypertension: Secondary | ICD-10-CM

## 2019-08-26 DIAGNOSIS — E875 Hyperkalemia: Secondary | ICD-10-CM

## 2019-08-26 DIAGNOSIS — E785 Hyperlipidemia, unspecified: Secondary | ICD-10-CM

## 2019-09-01 ENCOUNTER — Ambulatory Visit (INDEPENDENT_AMBULATORY_CARE_PROVIDER_SITE_OTHER): Payer: Medicare Other | Admitting: Internal Medicine

## 2019-09-01 ENCOUNTER — Encounter (INDEPENDENT_AMBULATORY_CARE_PROVIDER_SITE_OTHER): Payer: Self-pay

## 2019-09-01 ENCOUNTER — Encounter: Payer: Self-pay | Admitting: Internal Medicine

## 2019-09-01 ENCOUNTER — Other Ambulatory Visit: Payer: Self-pay

## 2019-09-01 ENCOUNTER — Ambulatory Visit (INDEPENDENT_AMBULATORY_CARE_PROVIDER_SITE_OTHER): Payer: Medicare Other | Admitting: *Deleted

## 2019-09-01 VITALS — BP 142/74 | HR 72 | Ht 70.0 in | Wt 147.0 lb

## 2019-09-01 DIAGNOSIS — Z95828 Presence of other vascular implants and grafts: Secondary | ICD-10-CM | POA: Diagnosis not present

## 2019-09-01 DIAGNOSIS — I5042 Chronic combined systolic (congestive) and diastolic (congestive) heart failure: Secondary | ICD-10-CM | POA: Diagnosis not present

## 2019-09-01 DIAGNOSIS — Z95810 Presence of automatic (implantable) cardiac defibrillator: Secondary | ICD-10-CM | POA: Insufficient documentation

## 2019-09-01 DIAGNOSIS — I472 Ventricular tachycardia, unspecified: Secondary | ICD-10-CM

## 2019-09-01 DIAGNOSIS — Z952 Presence of prosthetic heart valve: Secondary | ICD-10-CM

## 2019-09-01 DIAGNOSIS — I10 Essential (primary) hypertension: Secondary | ICD-10-CM | POA: Diagnosis not present

## 2019-09-01 DIAGNOSIS — I359 Nonrheumatic aortic valve disorder, unspecified: Secondary | ICD-10-CM

## 2019-09-01 DIAGNOSIS — Z7901 Long term (current) use of anticoagulants: Secondary | ICD-10-CM

## 2019-09-01 DIAGNOSIS — E039 Hypothyroidism, unspecified: Secondary | ICD-10-CM

## 2019-09-01 DIAGNOSIS — Z5181 Encounter for therapeutic drug level monitoring: Secondary | ICD-10-CM

## 2019-09-01 LAB — POCT INR: INR: 1.9 — AB (ref 2.0–3.0)

## 2019-09-01 NOTE — Patient Instructions (Addendum)
Description   Today take 1.5 tablets then start taking 1/2 tablet daily except 1 tablet on Tuesdays and Saturdays. Recheck INR in 3 weeks. Coumadin Clinic [phone]

## 2019-09-01 NOTE — Patient Instructions (Signed)
Medication Instructions:  Your physician recommends that you continue on your current medications as directed. Please refer to the Current Medication list given to you today.  Labwork: None ordered.  Testing/Procedures: None ordered.  Follow-Up: Your physician wants you to follow-up in: one year with Dr. Lovena Le.   You will receive a reminder letter in the mail two months in advance. If you don't receive a letter, please call our office to schedule the follow-up appointment.  Remote monitoring is used to monitor your ICD from home. This monitoring reduces the number of office visits required to check your device to one time per year. It allows [place] to keep an eye on the functioning of your device to ensure it is working properly. You are scheduled for a device check from home on 10/13/2019. You may send your transmission at any time that day. If you have a wireless device, the transmission will be sent automatically. After your physician reviews your transmission, you will receive a postcard with your next transmission date.  Any Other Special Instructions Will Be Listed Below (If Applicable).  If you need a refill on your cardiac medications before your next appointment, please call your pharmacy.

## 2019-09-01 NOTE — Progress Notes (Signed)
HPI [name] returns today for followup of his ICD. He has a h/o AVR, aortic root replacement, s/p ICD secondary to VT. He has done well in the interim with no recurrent ICD therapies. He is retired. No chest pain or sob. No edema. He has retired. Allergies  Allergen Reactions  . Morphine And Related Other (See Comments)    Hallucinations      Current Outpatient Medications  Medication Sig Dispense Refill  . amiodarone (PACERONE) 200 MG tablet TAKE ONE TABLET BY MOUTH DAILY 90 tablet 1  . levothyroxine (SYNTHROID) 50 MCG tablet Take 50 mcg ( 1 tablet ) daily 4 days a week and 100 mcg ( 2 tablets ) daily 3 days a week 40 tablet 6  . rosuvastatin (CRESTOR) 10 MG tablet Take 5 mg by mouth daily.    Marland Kitchen warfarin (COUMADIN) 5 MG tablet TAKE 1/2 TO 1 TABLET DAILY AS DIRECTED BY COUMADIN CLINIC 90 tablet 0   No current facility-administered medications for this visit.      Past Medical History:  Diagnosis Date  . AVD (aortic valve disease)   . Chronic anticoagulation   . Dilated cardiomyopathy (HCC)    EF 30-35%  . Dysrhythmia   . Hyperlipidemia   . LV dysfunction     ROS:   All systems reviewed and negative except as noted in the HPI.   Past Surgical History:  Procedure Laterality Date  . AORTIC VALVE REPLACEMENT    . BENTALL PROCEDURE N/A 04/29/2017   Procedure: REPLACEMENT ASCENDING AORTA/ WHEAT PROCEDURE, HYPOTHERMIC CIRCULATORY ARREST AND CARDIOPULMONARY BYPASS, AND USE OF HEMASHIELD PLATINUM WOVEN DOUBLE VELOUR VASCULAR GRAFT 32 MM X 50 CM;  Surgeon: [name], MD;  Location: [facility];  Service: Open Heart Surgery;  Laterality: N/A;  . DOPPLER ECHOCARDIOGRAPHY  04/07/2002   EF 30-35%  . ICD IMPLANT N/A 07/12/2017   Procedure: ICD Implant;  Surgeon: Evans Lance, MD;  Location: [facility];  Service: Cardiovascular;  Laterality: N/A;  . RIGHT/LEFT HEART CATH AND CORONARY ANGIOGRAPHY N/A 03/05/2017   Procedure: Right/Left Heart Cath and Coronary  Angiography;  Surgeon: Peter M Martinique, MD;  Location: [facility];  Service: Cardiovascular;  Laterality: N/A;  . TEE WITHOUT CARDIOVERSION N/A 04/29/2017   Procedure: TRANSESOPHAGEAL ECHOCARDIOGRAM (TEE);  Surgeon: [name], MD;  Location: [facility];  Service: Open Heart Surgery;  Laterality: N/A;     Family History  Problem Relation Age of Onset  . Heart disease Brother      Social History   Socioeconomic History  . Marital status: Married    Spouse name: Not on file  . Number of children: Not on file  . Years of education: Not on file  . Highest education level: Not on file  Occupational History  . Not on file  Social Needs  . Financial resource strain: Not on file  . Food insecurity    Worry: Not on file    Inability: Not on file  . Transportation needs    Medical: Not on file    Non-medical: Not on file  Tobacco Use  . Smoking status: Former Smoker    Quit date: 11/12/2000    Years since quitting: 18.8  . Smokeless tobacco: Never Used  Substance and Sexual Activity  . Alcohol use: No  . Drug use: No  . Sexual activity: Yes  Lifestyle  . Physical activity    Days per week: Not on file  Minutes per session: Not on file  . Stress: Not at all  Relationships  . Social connections    Talks on phone: More than three times a week    Gets together: More than three times a week    Attends religious service: Not on file    Active member of club or organization: Not on file    Attends meetings of clubs or organizations: Never    Relationship status: Married  . Intimate partner violence    Fear of current or ex partner: Not on file    Emotionally abused: Not on file    Physically abused: Not on file    Forced sexual activity: Not on file  Other Topics Concern  . Not on file  Social History Narrative  . Not on file     BP (!) 142/74   Pulse 72   Ht 5\' 10"  (1.778 m)   Wt 147 lb (66.7 kg)   SpO2 98%   BMI 21.09 kg/m   Physical Exam:  Well  appearing NAD HEENT: Unremarkable Neck:  No JVD, no thyromegally Lymphatics:  No adenopathy Back:  No CVA tenderness Lungs:  Clear with no wheezes HEART:  Regular rate rhythm, no murmurs, no rubs, no clicks Abd:  soft, positive bowel sounds, no organomegally, no rebound, no guarding Ext:  2 plus pulses, no edema, no cyanosis, no clubbing Skin:  No rashes no nodules Neuro:  CN II through XII intact, motor grossly intact  EKG - none  DEVICE  Normal device function.  See PaceArt for details.   Assess/Plan: 1. VT - he has had none since his last visit. He will continue low dose amio. If he is arrhythmia free when I see him back, we will reduce his dose of amiodarone. 2. Chronic systolic heart failure - he has class 2 symptoms. He will continue his current meds and maintain a low sodium diet. 3. ICD - his [employer] single chamber ICD is working normally.  .D.

## 2019-09-09 LAB — BASIC METABOLIC PANEL
BUN/Creatinine Ratio: 10 (ref 10–24)
BUN: 17 mg/dL (ref 8–27)
CO2: 25 mmol/L (ref 20–29)
Calcium: 9.4 mg/dL (ref 8.6–10.2)
Chloride: 96 mmol/L (ref 96–106)
Creatinine, Ser: 1.65 mg/dL — ABNORMAL HIGH (ref 0.76–1.27)
GFR calc Af Amer: 47 mL/min/{1.73_m2} — ABNORMAL LOW (ref >59)
GFR calc non Af Amer: 40 mL/min/{1.73_m2} — ABNORMAL LOW (ref >59)
Glucose: 98 mg/dL (ref 65–99)
Potassium: 5 mmol/L (ref 3.5–5.2)
Sodium: 133 mmol/L — ABNORMAL LOW (ref 134–144)

## 2019-09-22 ENCOUNTER — Other Ambulatory Visit: Payer: Self-pay

## 2019-09-22 ENCOUNTER — Ambulatory Visit (INDEPENDENT_AMBULATORY_CARE_PROVIDER_SITE_OTHER): Payer: Medicare Other

## 2019-09-22 DIAGNOSIS — I359 Nonrheumatic aortic valve disorder, unspecified: Secondary | ICD-10-CM

## 2019-09-22 DIAGNOSIS — Z7901 Long term (current) use of anticoagulants: Secondary | ICD-10-CM | POA: Diagnosis not present

## 2019-09-22 DIAGNOSIS — Z5181 Encounter for therapeutic drug level monitoring: Secondary | ICD-10-CM

## 2019-09-22 DIAGNOSIS — Z95828 Presence of other vascular implants and grafts: Secondary | ICD-10-CM

## 2019-09-22 DIAGNOSIS — E039 Hypothyroidism, unspecified: Secondary | ICD-10-CM | POA: Diagnosis not present

## 2019-09-22 DIAGNOSIS — Z952 Presence of prosthetic heart valve: Secondary | ICD-10-CM

## 2019-09-22 LAB — POCT INR: INR: 2.5 (ref 2.0–3.0)

## 2019-09-22 NOTE — Patient Instructions (Signed)
Description   Continue on same dosage 1/2 tablet daily except 1 tablet on Tuesdays and Saturdays. Recheck INR in 5 weeks. Coumadin Clinic [phone]

## 2019-10-13 ENCOUNTER — Ambulatory Visit (INDEPENDENT_AMBULATORY_CARE_PROVIDER_SITE_OTHER): Payer: Medicare Other | Admitting: *Deleted

## 2019-10-13 DIAGNOSIS — I420 Dilated cardiomyopathy: Secondary | ICD-10-CM

## 2019-10-13 LAB — CUP PACEART REMOTE DEVICE CHECK
Date Time Interrogation Session: 20201201065653
Implantable Lead Implant Date: 20180831
Implantable Lead Location: 753860
Implantable Lead Model: 293
Implantable Lead Serial Number: 435986
Implantable Pulse Generator Implant Date: 20180831
Pulse Gen Serial Number: 240202

## 2019-10-14 ENCOUNTER — Ambulatory Visit (INDEPENDENT_AMBULATORY_CARE_PROVIDER_SITE_OTHER): Payer: Medicare Other

## 2019-10-14 DIAGNOSIS — I5042 Chronic combined systolic (congestive) and diastolic (congestive) heart failure: Secondary | ICD-10-CM

## 2019-10-14 DIAGNOSIS — Z95810 Presence of automatic (implantable) cardiac defibrillator: Secondary | ICD-10-CM

## 2019-10-16 ENCOUNTER — Telehealth: Payer: Self-pay

## 2019-10-16 NOTE — Telephone Encounter (Signed)
Remote ICM transmission received.  Attempted call to patient regarding ICM remote transmission and left detailed message per DPR.  Advised to return call for any fluid symptoms or questions. Next ICM remote transmission scheduled 11/16/2019.     

## 2019-10-16 NOTE — Progress Notes (Signed)
EPIC Encounter for ICM Monitoring  Patient Name: Jack Joseph is a 73 y.o. male Date: 10/16/2019 Primary Care Physican: Martinique, Betty G, MD Primary Cardiologist:Jordan Electrophysiologist:Taylor 10/12/2020Weight: 140's  Attempted call to patient and unable to reach.  Left detailed message per DPR regarding transmission. Transmission reviewed.   10/12/2019 HeartLogic Heart Failure Index is12which is within normal threshold range.  Prescribed:Spironolactone 25 mg take 0.5 tablet daily.  Recommendations: Left voice mail with ICM number and encouraged to call if experiencing any fluid symptoms.  Follow-up plan: ICM clinic phone appointment on 11/16/2019.   91 day device clinic remote transmission 01/12/2020.    Copy of ICM check sent to Dr. Lovena Le.   3 month ICM trend: 10/12/2019       Rosalene Billings, RN 10/16/2019 11:27 AM

## 2019-10-23 ENCOUNTER — Other Ambulatory Visit: Payer: Self-pay | Admitting: Cardiology

## 2019-10-27 ENCOUNTER — Ambulatory Visit (INDEPENDENT_AMBULATORY_CARE_PROVIDER_SITE_OTHER): Payer: Medicare Other | Admitting: *Deleted

## 2019-10-27 ENCOUNTER — Other Ambulatory Visit: Payer: Self-pay

## 2019-10-27 DIAGNOSIS — Z5181 Encounter for therapeutic drug level monitoring: Secondary | ICD-10-CM | POA: Diagnosis not present

## 2019-10-27 DIAGNOSIS — E039 Hypothyroidism, unspecified: Secondary | ICD-10-CM

## 2019-10-27 DIAGNOSIS — Z7901 Long term (current) use of anticoagulants: Secondary | ICD-10-CM

## 2019-10-27 DIAGNOSIS — Z95828 Presence of other vascular implants and grafts: Secondary | ICD-10-CM

## 2019-10-27 DIAGNOSIS — I359 Nonrheumatic aortic valve disorder, unspecified: Secondary | ICD-10-CM | POA: Diagnosis not present

## 2019-10-27 DIAGNOSIS — Z952 Presence of prosthetic heart valve: Secondary | ICD-10-CM

## 2019-10-27 LAB — POCT INR: INR: 5.9 — AB (ref 2.0–3.0)

## 2019-10-27 NOTE — Telephone Encounter (Signed)
Rx has been sent to the pharmacy electronically. ° °

## 2019-10-27 NOTE — Patient Instructions (Addendum)
Description   Do not take any Warfarin tomorrow and No Warfarin on Thursday then continue 1/2 tablet daily except 1 tablet on Tuesdays and Saturdays. Have some green leafy veggies today and tomorrow and remain consistent. Recheck INR in 3 weeks per pt. Coumadin Clinic [phone]

## 2019-10-28 ENCOUNTER — Other Ambulatory Visit: Payer: Self-pay | Admitting: Cardiology

## 2019-11-07 NOTE — Progress Notes (Signed)
ICD remote 

## 2019-11-15 ENCOUNTER — Other Ambulatory Visit: Payer: Self-pay | Admitting: Cardiology

## 2019-11-16 ENCOUNTER — Ambulatory Visit (INDEPENDENT_AMBULATORY_CARE_PROVIDER_SITE_OTHER): Payer: Medicare Other

## 2019-11-16 DIAGNOSIS — Z95810 Presence of automatic (implantable) cardiac defibrillator: Secondary | ICD-10-CM | POA: Diagnosis not present

## 2019-11-16 DIAGNOSIS — I5042 Chronic combined systolic (congestive) and diastolic (congestive) heart failure: Secondary | ICD-10-CM | POA: Diagnosis not present

## 2019-11-17 ENCOUNTER — Ambulatory Visit (INDEPENDENT_AMBULATORY_CARE_PROVIDER_SITE_OTHER): Payer: Medicare Other

## 2019-11-17 ENCOUNTER — Other Ambulatory Visit: Payer: Self-pay

## 2019-11-17 DIAGNOSIS — Z95828 Presence of other vascular implants and grafts: Secondary | ICD-10-CM | POA: Diagnosis not present

## 2019-11-17 DIAGNOSIS — I359 Nonrheumatic aortic valve disorder, unspecified: Secondary | ICD-10-CM | POA: Diagnosis not present

## 2019-11-17 DIAGNOSIS — E039 Hypothyroidism, unspecified: Secondary | ICD-10-CM

## 2019-11-17 DIAGNOSIS — Z5181 Encounter for therapeutic drug level monitoring: Secondary | ICD-10-CM | POA: Diagnosis not present

## 2019-11-17 DIAGNOSIS — Z7901 Long term (current) use of anticoagulants: Secondary | ICD-10-CM

## 2019-11-17 DIAGNOSIS — Z952 Presence of prosthetic heart valve: Secondary | ICD-10-CM

## 2019-11-17 LAB — POCT INR: INR: 5.8 — AB (ref 2.0–3.0)

## 2019-11-17 NOTE — Patient Instructions (Signed)
Description   Do not take any Warfarin tomorrow and No Warfarin on Thursday then start taking 1/2 tablet daily.  Recheck INR in 2 weeks, pt requests 3 weeks. Coumadin Clinic [phone]

## 2019-11-18 ENCOUNTER — Other Ambulatory Visit: Payer: Self-pay | Admitting: Cardiology

## 2019-11-18 ENCOUNTER — Telehealth: Payer: Self-pay

## 2019-11-18 NOTE — Telephone Encounter (Signed)
Remote ICM transmission received.  Attempted call to patient regarding ICM remote transmission and left detailed message per DPR.  Advised to return call for any fluid symptoms or questions.  

## 2019-11-18 NOTE — Progress Notes (Signed)
EPIC Encounter for ICM Monitoring  Patient Name: Jack Joseph is a 73 y.o. male Date: 11/18/2019 Primary Care Physican: Swaziland, Betty G, MD Primary Cardiologist:Jordan Electrophysiologist:Taylor 10/12/2020Weight:140's  Attempted call to patient and unable to reach.  Left detailed message per DPR regarding transmission. Transmission reviewed.   11/15/2019 HeartLogic Heart Failure Index is6which is within normal threshold range.  Prescribed:Spironolactone 25 mg take 0.5 tablet daily.  Recommendations: Left voice mail with ICM number and encouraged to call if experiencing any fluid symptoms.  Follow-up plan: ICM clinic phone appointment on 12/21/2019.   91 day device clinic remote transmission 01/12/2020.          Copy of ICM check sent to Dr. Ladona Ridgel.  3 Month Trend    8 Day Data Trend         Karie Soda, RN 11/18/2019 10:23 AM

## 2019-11-27 LAB — LIPID PANEL
Chol/HDL Ratio: 2.2 ratio (ref 0.0–5.0)
Cholesterol, Total: 152 mg/dL (ref 100–199)
HDL: 70 mg/dL (ref >39)
LDL Chol Calc (NIH): 69 mg/dL (ref 0–99)
Triglycerides: 63 mg/dL (ref 0–149)
VLDL Cholesterol Cal: 13 mg/dL (ref 5–40)

## 2019-11-27 LAB — COMPREHENSIVE METABOLIC PANEL
ALT: 17 IU/L (ref 0–44)
AST: 32 IU/L (ref 0–40)
Albumin/Globulin Ratio: 1.6 (ref 1.2–2.2)
Albumin: 4.5 g/dL (ref 3.7–4.7)
Alkaline Phosphatase: 90 IU/L (ref 39–117)
BUN/Creatinine Ratio: 9 — ABNORMAL LOW (ref 10–24)
BUN: 16 mg/dL (ref 8–27)
Bilirubin Total: 0.7 mg/dL (ref 0.0–1.2)
CO2: 28 mmol/L (ref 20–29)
Calcium: 9.7 mg/dL (ref 8.6–10.2)
Chloride: 98 mmol/L (ref 96–106)
Creatinine, Ser: 1.86 mg/dL — ABNORMAL HIGH (ref 0.76–1.27)
GFR calc Af Amer: 40 mL/min/{1.73_m2} — ABNORMAL LOW (ref >59)
GFR calc non Af Amer: 35 mL/min/{1.73_m2} — ABNORMAL LOW (ref >59)
Globulin, Total: 2.8 g/dL (ref 1.5–4.5)
Glucose: 87 mg/dL (ref 65–99)
Potassium: 5.3 mmol/L — ABNORMAL HIGH (ref 3.5–5.2)
Sodium: 138 mmol/L (ref 134–144)
Total Protein: 7.3 g/dL (ref 6.0–8.5)

## 2019-12-08 ENCOUNTER — Other Ambulatory Visit: Payer: Self-pay

## 2019-12-08 ENCOUNTER — Ambulatory Visit (INDEPENDENT_AMBULATORY_CARE_PROVIDER_SITE_OTHER): Payer: Medicare Other | Admitting: *Deleted

## 2019-12-08 DIAGNOSIS — Z952 Presence of prosthetic heart valve: Secondary | ICD-10-CM | POA: Diagnosis not present

## 2019-12-08 DIAGNOSIS — I359 Nonrheumatic aortic valve disorder, unspecified: Secondary | ICD-10-CM | POA: Diagnosis not present

## 2019-12-08 DIAGNOSIS — Z5181 Encounter for therapeutic drug level monitoring: Secondary | ICD-10-CM | POA: Diagnosis not present

## 2019-12-08 DIAGNOSIS — Z95828 Presence of other vascular implants and grafts: Secondary | ICD-10-CM

## 2019-12-08 DIAGNOSIS — Z7901 Long term (current) use of anticoagulants: Secondary | ICD-10-CM

## 2019-12-08 DIAGNOSIS — E039 Hypothyroidism, unspecified: Secondary | ICD-10-CM

## 2019-12-08 LAB — POCT INR: INR: 6 — AB (ref 2.0–3.0)

## 2019-12-08 MED ORDER — WARFARIN SODIUM 2.5 MG PO TABS: ORAL_TABLET | ORAL | 0 refills | Status: DC

## 2019-12-08 NOTE — Patient Instructions (Addendum)
Description   Do not take any Warfarin tomorrow, No Warfarin on Thursday, No Warfarin on Friday then start using 2.5mg  tablet and start taking 1 tablet daily except 1/2 tablet daily on Sundays, Tuesdays, and Thursdays.  Recheck INR in 1 week, pt requests 3 weeks. Coumadin Clinic [phone]

## 2019-12-15 ENCOUNTER — Telehealth: Payer: Self-pay | Admitting: Family Medicine

## 2019-12-15 NOTE — Telephone Encounter (Signed)
Left message for patient to call back and schedule Medicare Annual Wellness Visit (AWV) either virtually,audio only or in person (whichever the patient prefers--45 MINUTES).  Last AWV 11.15.19; please schedule at anytime with [facility]Nurse Health Advisor at [facility] [place].

## 2019-12-29 ENCOUNTER — Ambulatory Visit (INDEPENDENT_AMBULATORY_CARE_PROVIDER_SITE_OTHER): Payer: Medicare Other | Admitting: Pharmacist

## 2019-12-29 ENCOUNTER — Other Ambulatory Visit: Payer: Self-pay

## 2019-12-29 ENCOUNTER — Ambulatory Visit (INDEPENDENT_AMBULATORY_CARE_PROVIDER_SITE_OTHER): Payer: Medicare Other

## 2019-12-29 DIAGNOSIS — Z5181 Encounter for therapeutic drug level monitoring: Secondary | ICD-10-CM

## 2019-12-29 DIAGNOSIS — Z95828 Presence of other vascular implants and grafts: Secondary | ICD-10-CM

## 2019-12-29 DIAGNOSIS — Z95810 Presence of automatic (implantable) cardiac defibrillator: Secondary | ICD-10-CM

## 2019-12-29 DIAGNOSIS — E039 Hypothyroidism, unspecified: Secondary | ICD-10-CM

## 2019-12-29 DIAGNOSIS — Z7901 Long term (current) use of anticoagulants: Secondary | ICD-10-CM | POA: Diagnosis not present

## 2019-12-29 DIAGNOSIS — Z952 Presence of prosthetic heart valve: Secondary | ICD-10-CM | POA: Diagnosis not present

## 2019-12-29 DIAGNOSIS — I359 Nonrheumatic aortic valve disorder, unspecified: Secondary | ICD-10-CM | POA: Diagnosis not present

## 2019-12-29 DIAGNOSIS — I5042 Chronic combined systolic (congestive) and diastolic (congestive) heart failure: Secondary | ICD-10-CM

## 2019-12-29 LAB — POCT INR: INR: 2.1 (ref 2.0–3.0)

## 2019-12-29 NOTE — Progress Notes (Signed)
EPIC Encounter for ICM Monitoring  Patient Name: Jack Joseph is a 74 y.o. male Date: 12/29/2019 Primary Care Physican: [place], Betty G, MD Primary Cardiologist:Jordan Electrophysiologist:Taylor 2/15/2021Weight:145 lbs  Spoke with patient and he is feeling fine.  He denies any fluid symptoms.  [place] past 10 days and ate foods that were higher in salt than he normally would eat.  Increased HF Index correlates with his time at the beach.  12/28/2019 HeartLogic Heart Failure Index 22 which is above normal threshold suggesting possible fluid accumulation. 12/12/2019 HeartLogic Heart Failure Index 13 which is within normal limits.  Recommendations: Recommendation to limit salt intake.  Encouraged to call if experiencing any fluid symptoms.    Follow-up plan: ICM clinic phone appointment on 02/01/2020 and will monitor for alerts.   91 day device clinic remote transmission 01/12/2020.           Copy of ICM check sent to Dr. Ladona Ridgel and Dr Swaziland.  3 Month Trend    8 Day Data Trend          Karie Soda, RN 12/29/2019 12:33 PM

## 2019-12-29 NOTE — Patient Instructions (Signed)
Description   Take an extra 1/2 tablet today when you get home, take 1.5 tablets tomorrow, then continue taking 1 tablet daily except 1/2 tablet daily on Sundays, Tuesdays, and Thursdays.  Recheck INR in 2 week, pt requests 3 weeks. Coumadin Clinic [phone]

## 2020-01-05 ENCOUNTER — Ambulatory Visit: Payer: Medicare Other

## 2020-01-12 ENCOUNTER — Ambulatory Visit (INDEPENDENT_AMBULATORY_CARE_PROVIDER_SITE_OTHER): Payer: Medicare Other | Admitting: *Deleted

## 2020-01-12 DIAGNOSIS — I420 Dilated cardiomyopathy: Secondary | ICD-10-CM

## 2020-01-12 LAB — CUP PACEART REMOTE DEVICE CHECK
Battery Remaining Longevity: 174 mo
Battery Remaining Percentage: 100 %
Brady Statistic RV Percent Paced: 0 %
Date Time Interrogation Session: 20210302033100
HighPow Impedance: 62 Ohm
Implantable Lead Implant Date: 20180831
Implantable Lead Location: 753860
Implantable Lead Model: 293
Implantable Lead Serial Number: 435986
Implantable Pulse Generator Implant Date: 20180831
Lead Channel Impedance Value: 445 Ohm
Lead Channel Setting Pacing Amplitude: 2.5 V
Lead Channel Setting Pacing Pulse Width: 0.4 ms
Lead Channel Setting Sensing Sensitivity: 0.5 mV
Pulse Gen Serial Number: 240202

## 2020-01-13 NOTE — Progress Notes (Signed)
ICD Remote  

## 2020-01-19 ENCOUNTER — Other Ambulatory Visit: Payer: Self-pay

## 2020-01-19 ENCOUNTER — Ambulatory Visit (INDEPENDENT_AMBULATORY_CARE_PROVIDER_SITE_OTHER): Payer: Medicare Other | Admitting: *Deleted

## 2020-01-19 DIAGNOSIS — Z95828 Presence of other vascular implants and grafts: Secondary | ICD-10-CM

## 2020-01-19 DIAGNOSIS — I359 Nonrheumatic aortic valve disorder, unspecified: Secondary | ICD-10-CM | POA: Diagnosis not present

## 2020-01-19 DIAGNOSIS — E039 Hypothyroidism, unspecified: Secondary | ICD-10-CM

## 2020-01-19 DIAGNOSIS — Z7901 Long term (current) use of anticoagulants: Secondary | ICD-10-CM

## 2020-01-19 DIAGNOSIS — Z5181 Encounter for therapeutic drug level monitoring: Secondary | ICD-10-CM

## 2020-01-19 DIAGNOSIS — Z952 Presence of prosthetic heart valve: Secondary | ICD-10-CM | POA: Diagnosis not present

## 2020-01-19 LAB — POCT INR: INR: 3 (ref 2.0–3.0)

## 2020-01-19 NOTE — Patient Instructions (Addendum)
Description   Continue taking 1 tablet daily except 1/2 tablet daily on Sundays, Tuesdays, and Thursdays.  Recheck INR in 6 weeks per pt request. Coumadin Clinic [phone]

## 2020-02-01 ENCOUNTER — Ambulatory Visit (INDEPENDENT_AMBULATORY_CARE_PROVIDER_SITE_OTHER): Payer: Medicare Other

## 2020-02-01 DIAGNOSIS — Z95810 Presence of automatic (implantable) cardiac defibrillator: Secondary | ICD-10-CM | POA: Diagnosis not present

## 2020-02-01 DIAGNOSIS — I5042 Chronic combined systolic (congestive) and diastolic (congestive) heart failure: Secondary | ICD-10-CM | POA: Diagnosis not present

## 2020-02-03 ENCOUNTER — Other Ambulatory Visit: Payer: Self-pay | Admitting: Cardiology

## 2020-02-03 NOTE — Progress Notes (Signed)
EPIC Encounter for ICM Monitoring  Patient Name: Jack Joseph is a 74 y.o. male Date: 02/03/2020 Primary Care Physican: Swazil[place], Betty G, MD Primary Cardiologist:Jordan Electrophysiologist:Taylor 2/15/2021Weight:145 lbs  Spoke with patient and he is feeling fine.  He denies any fluid symptoms.  He admits to eating foods that are not low in salt such as ham.  03/22/2021HeartLogic Heart Failure Index 18 which is above normal threshold suggesting possible fluid accumulation.  Recommendations: Recommendation to limit salt intake.  Encouraged to call if experiencing any fluid symptoms.    Follow-up plan: ICM clinic phone appointment on 03/07/2020 and will monitor for alerts.   91 day device clinic remote transmission 04/12/2020.           Copy of ICM check sent to Dr. Ladona Ridgel and Dr Swaziland.  3 Month Trend    8 Day Data Trend          Karie Soda, RN 02/03/2020 3:10 PM

## 2020-02-23 NOTE — Progress Notes (Signed)
Jack Joseph Date of Birth: 14-Jul-1946   History of Present Illness: Jack Joseph is seen today for post hospital follow up.  He is status post aortic valve replacement with a Bjork Shiley valve in [place] in [place]. He is on chronic Coumadin therapy. He has a history of dilated cardiomyopathy with prior ejection fraction of 30-35%.   In 2018  CXR showed a left hilar rounded mass and CT was recommended. 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 An Echo was also obtained showing normal functioning of his prosthetic AV. It confirmed the ascending aortic aneurysm. LV function was severely reduced with EF 25-30%. There was mild pulmonary HTN. Cardiac cath showed mild nonobstructive CAD with normal right heart pressures and cardiac output. The prosthetic AV disc moved well on fluoro.   On 04/29/17 he underwent a Wheat procedure with replacement of his ascending aorta by Dr. Servando Snare. The prior AV prosthesis was left in place. His post op course was complicated. He was initially extubated on the evening of surgery and inotropes were weaned. On the next day he developed pulseless VT with cardiac arrest. He required cardioversion, CPR, and reintubation. He was loaded on IV amiodarone. He developed a pneumothorax and had a chest tube placed. On POD #3 he was once again extubated. He developed subcutaneous emphysema on the right and had a second chest tube placed. He required reintubation again due to mucus plugging. On POD#6 he was extubated again. On POD #8 his subcutaneous emphysema worsened and his right chest tube had to be replaced. He required transfusion on 2 occasions for anemia. On POD #8 he had increased atelectasis. He had bronchoscopy. Breathing again worsened and he was reintubated. He required TPN for nutritional support and IV pressors for BP support. He improved and was able to be extubated on POD #13 and  chest tubes removed on POD #16. Diet was advanced and he was transitioned to coumadin. He also developed diarrhea and was C. Diff positive. This was treated. On July 12 he was transferred to inpatient Rehab for PT/OT. He continued to have SOB and hypoxia treated with lasix and Xopenex. This improved and he was weaned off oxygen. He was DC home on July 20 on coumadin, amiodarone, and losartan. Lifevest was ordered. Echo on July 9 showed EF 20-25%. Dr. Lovena Le recommended consideration for ICD implant and this was done on 07/12/17.   Last ICD check on 01/12/20 showed  Normal device function.  On follow up today he is is doing well. [place].  He has a chronic cough and sinus drainage- thinks its related to something in his house. Doesn't notice it when he is at [employer]. Daughter lives on [place] and bartends on [place]. No palpitations, chest pain or dizziness. Appetite is good.    Current Outpatient Medications on File Prior to Visit  Medication Sig Dispense Refill  . amiodarone (PACERONE) 200 MG tablet TAKE ONE TABLET BY MOUTH DAILY 95 tablet 0  . levothyroxine (SYNTHROID) 50 MCG tablet TAKE ONE TABLET BY MOUTH DAILY ONLY 4 DAYS PER WEEK AND TAKE TWO TABLETS BY MOUTH DAILY ON 3 DAYS PER WEEK 40 tablet 5  . rosuvastatin (CRESTOR) 10 MG tablet Take 5 mg by mouth daily.    Marland Kitchen spironolactone (ALDACTONE) 25 MG tablet TAKE ONE-HALF TABLET BY MOUTH DAILY 45  tablet 4  . warfarin (COUMADIN) 2.5 MG tablet Take 1 tablet daily except 1/2 tablet on Tuesday, Thursday, Sunday or as directed by Anticoagulation Clinic. 90 tablet 0   No current facility-administered medications on file prior to visit.    Allergies  Allergen Reactions  . Morphine And Related Other (See Comments)    Hallucinations     Past Medical History:  Diagnosis Date  . AVD (aortic valve disease)   . Chronic anticoagulation   . Dilated cardiomyopathy (HCC)    EF 30-35%   . Dysrhythmia   . Hyperlipidemia   . LV dysfunction     Past Surgical History:  Procedure Laterality Date  . AORTIC VALVE REPLACEMENT    . BENTALL PROCEDURE N/A 04/29/2017   Procedure: REPLACEMENT ASCENDING AORTA/ WHEAT PROCEDURE, HYPOTHERMIC CIRCULATORY ARREST AND CARDIOPULMONARY BYPASS, AND USE OF HEMASHIELD PLATINUM WOVEN DOUBLE VELOUR VASCULAR GRAFT 32 MM X 50 CM;  Surgeon: [name], MD;  Location: [facility] OR;  Service: Open Heart Surgery;  Laterality: N/A;  . DOPPLER ECHOCARDIOGRAPHY  04/07/2002   EF 30-35%  . ICD IMPLANT N/A 07/12/2017   Procedure: ICD Implant;  Surgeon: Marinus Maw, MD;  [facility]: [facility] INVASIVE CV LAB;  Service: Cardiovascular;  Laterality: N/A;  . RIGHT/LEFT HEART CATH AND CORONARY ANGIOGRAPHY N/A 03/05/2017   Procedure: Right/Left Heart Cath and Coronary Angiography;  Surgeon: Consuella Scurlock M Swaziland, MD;  Location: [facility] INVASIVE CV LAB;  Service: Cardiovascular;  Laterality: N/A;  . TEE WITHOUT CARDIOVERSION N/A 04/29/2017   Procedure: TRANSESOPHAGEAL ECHOCARDIOGRAM (TEE);  Surgeon: [name], MD;  Location: [facility] OR;  Service: Open Heart Surgery;  Laterality: N/A;    Social History   Tobacco Use  Smoking Status Former Smoker  . Quit date: 11/12/2000  . Years since quitting: 19.2  Smokeless Tobacco Never Used    Social History   Substance and Sexual Activity  Alcohol Use No    Family History  Problem Relation Age of Onset  . Heart disease Brother     Review of Systems: As noted in history of present illness.  All other systems were reviewed and are negative.  Physical Exam: BP 112/76   Pulse 72   Ht 5\' 10"  (1.778 m)   Wt 145 lb (65.8 kg)   SpO2 96%   BMI 20.81 kg/m  GENERAL:  Well appearing, thin WM in NAD HEENT:  PERRL, EOMI, sclera are clear. Oropharynx is clear. NECK:  No jugular venous distention, carotid upstroke brisk and symmetric, no bruits, no thyromegaly or adenopathy LUNGS:  Clear to auscultation bilaterally CHEST:   Unremarkable HEART:  RRR,  PMI not displaced or sustained,good mechanical AV click. no S3, no S4: no clicks, no rubs, no murmurs ABD:  Soft, nontender. BS +, no masses or bruits. No hepatomegaly, no splenomegaly EXT:  2 + pulses throughout, no  ankle edema, no cyanosis no clubbing. Hand is mildly swollen and tender where splinter removed. SKIN:  Warm and dry.  No rashes NEURO:  Alert and oriented x 3. Cranial nerves II through XII intact. PSYCH:  Cognitively intact     LABORATORY DATA:  Lab Results  Component Value Date   WBC 8.0 08/24/2019   HGB 13.6 08/24/2019   HCT 39.5 08/24/2019   PLT 222 08/24/2019   GLUCOSE 87 11/26/2019   CHOL 152 11/26/2019   TRIG 63 11/26/2019   HDL 70 11/26/2019   LDLCALC 69 11/26/2019   ALT 17 11/26/2019   AST 32 11/26/2019   NA 138  11/26/2019   K 5.3 (H) 11/26/2019   CL 98 11/26/2019   CREATININE 1.86 (H) 11/26/2019   BUN 16 11/26/2019   CO2 28 11/26/2019   TSH 3.700 08/24/2019   PSA 0.83 09/16/2017   INR 3.0 01/19/2020   HGBA1C 5.7 (H) 04/25/2017     Echo 05/20/17:Study Conclusions  - Procedure narrative: Transthoracic echocardiography. Image   quality was poor. The study was technically difficult, as a   result of poor sound wave transmission. - Left ventricle: The cavity size was mildly dilated. Wall   thickness was normal. Systolic function was severely reduced. The   estimated ejection fraction was in the range of 20% to 25%.   Diffuse hypokinesis. Features are consistent with a pseudonormal   left ventricular filling pattern, with concomitant abnormal   relaxation and increased filling pressure (grade 2 diastolic   dysfunction). - Aortic valve: A mechanical prosthesis was present. - Mitral valve: There was mild regurgitation. - Pericardium, extracardiac: There was a left pleural effusion.  Impressions:  - Severe global reduction in LV systolic function; moderate   diastolic dysfunction; mild LVE; s/p AVR with normal mean    gradient; mildly dilated aortic root; mild MR.   Assessment / Plan: 1. Status post remote aortic valve replacement with a Bjork Shiley mechanical prosthesis. He is on chronic Coumadin therapy.  Pharmacy to check INR today.   SBE prophylaxis.   2. Chronic systolic CHF/Dilated cardiomyopathy. Ejection fraction of 20-25% post Aortic aneurysm repair.  Patient is class 1-2. Only on losartan and aldactone at low doses. Unable to titrate due to low BP.   3. Thoracic aortic aneurysm. Ascending aorta. 6.3 cm.  Now s/p Wheat procedure with replacement. 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 Post op course very complicated due to development of VT, acute respiratory failure, subQ emphysema, multiple chest tubes, anemia, and deconditioning. He clinically is improved.  4. VT following Aortic aneurysm surgery. Low EF. S/p ICD implant. Continue amiodarone 5 days/week.   5. Abdominal bruit. [place] negative for AAA.  Borderline iliac disease.  6. Hypothyroidism. Thyroid dose increased in August. Normal TSH in October.   Follow up in 6 months tentatively. If he moves will need to get established with cardiology closer to him- either in [place] or [place].

## 2020-02-25 ENCOUNTER — Ambulatory Visit (INDEPENDENT_AMBULATORY_CARE_PROVIDER_SITE_OTHER): Payer: Medicare Other | Admitting: Cardiology

## 2020-02-25 ENCOUNTER — Ambulatory Visit (INDEPENDENT_AMBULATORY_CARE_PROVIDER_SITE_OTHER): Payer: Medicare Other | Admitting: Pharmacist Clinician (PhC)/ Clinical Pharmacy Specialist

## 2020-02-25 ENCOUNTER — Other Ambulatory Visit: Payer: Self-pay

## 2020-02-25 ENCOUNTER — Encounter: Payer: Self-pay | Admitting: Cardiology

## 2020-02-25 VITALS — BP 112/76 | HR 72 | Ht 70.0 in | Wt 145.0 lb

## 2020-02-25 DIAGNOSIS — I472 Ventricular tachycardia, unspecified: Secondary | ICD-10-CM

## 2020-02-25 DIAGNOSIS — I359 Nonrheumatic aortic valve disorder, unspecified: Secondary | ICD-10-CM | POA: Diagnosis not present

## 2020-02-25 DIAGNOSIS — Z95810 Presence of automatic (implantable) cardiac defibrillator: Secondary | ICD-10-CM

## 2020-02-25 DIAGNOSIS — Z7901 Long term (current) use of anticoagulants: Secondary | ICD-10-CM | POA: Diagnosis not present

## 2020-02-25 DIAGNOSIS — Z952 Presence of prosthetic heart valve: Secondary | ICD-10-CM | POA: Diagnosis not present

## 2020-02-25 DIAGNOSIS — I5042 Chronic combined systolic (congestive) and diastolic (congestive) heart failure: Secondary | ICD-10-CM

## 2020-02-25 DIAGNOSIS — I420 Dilated cardiomyopathy: Secondary | ICD-10-CM

## 2020-02-25 DIAGNOSIS — Z95828 Presence of other vascular implants and grafts: Secondary | ICD-10-CM | POA: Diagnosis not present

## 2020-02-25 DIAGNOSIS — Z5181 Encounter for therapeutic drug level monitoring: Secondary | ICD-10-CM

## 2020-02-25 DIAGNOSIS — E039 Hypothyroidism, unspecified: Secondary | ICD-10-CM

## 2020-02-25 LAB — POCT INR: INR: 3.9 — AB (ref 2.0–3.0)

## 2020-02-25 NOTE — Patient Instructions (Signed)
Continue taking 1 tablet daily except 1/2 tablet daily on Sundays, Tuesdays, and Thursdays.  Recheck INR in 2 weeks

## 2020-02-25 NOTE — Patient Instructions (Signed)
Jack Counts III MD [facility] in [place]  Evelena Asa MD or Layla Barter MD [facility] in [place].

## 2020-03-07 ENCOUNTER — Ambulatory Visit (INDEPENDENT_AMBULATORY_CARE_PROVIDER_SITE_OTHER): Payer: Medicare Other

## 2020-03-07 ENCOUNTER — Telehealth: Payer: Self-pay

## 2020-03-07 DIAGNOSIS — I5042 Chronic combined systolic (congestive) and diastolic (congestive) heart failure: Secondary | ICD-10-CM

## 2020-03-07 DIAGNOSIS — Z95810 Presence of automatic (implantable) cardiac defibrillator: Secondary | ICD-10-CM

## 2020-03-07 NOTE — Progress Notes (Signed)
Returned call to patient as requested by voice mail message.  Patient is moving to [place] tomorrow.  Dr Swaziland provided cardiologist names in that area and patient will call to set up appointment once he gets moved.  He will stay with his daughter on [place] for about 2 weeks and then move to their new home in [place] home community.  Advised to use Latitude monitor at his daughters house and once he gets move into his new home.  Explained one he gets established with a cardiologist in that area then remote monitoring will be transferred to the new cardiologist clinic.  He is doing well at this time and had visit with Dr Swaziland 2 weeks ago. No changes and encouraged to call if experiencing any fluid symptoms.

## 2020-03-07 NOTE — Telephone Encounter (Signed)
Remote ICM transmission received.  Attempted call to patient regarding ICM remote transmission and left detailed message per DPR.  Advised to return call for any fluid symptoms or questions. Next ICM remote transmission scheduled 04/13/2020.     

## 2020-03-07 NOTE — Progress Notes (Signed)
EPIC Encounter for ICM Monitoring  Patient Name: Jack Joseph is a 74 y.o. male Date: 03/07/2020 Primary Care Physican: Swaziland, Betty G, MD Primary Cardiologist:Jordan Electrophysiologist:Taylor 4/15/2021Office Weight:145 lbs  Attempted call to patient and unable to reach.  Left detailed message per DPR regarding transmission. Transmission reviewed.   4/25/2021HeartLogic Heart Failure Index0 which suggests within normal threshold.  Recommendations:Left voice mail with ICM number and encouraged to call if experiencing any fluid symptoms.  Follow-up plan: ICM clinic phone appointment on6/12/2019. 91 day device clinic remote transmission 04/12/2020.   Copy of ICM check sent to Dr. Ladona Ridgel.  3 Month Trend    8 Day Data Trend          Karie Soda, RN 03/07/2020 2:16 PM

## 2020-03-28 ENCOUNTER — Other Ambulatory Visit: Payer: Self-pay

## 2020-03-28 MED ORDER — WARFARIN SODIUM 2.5 MG PO TABS: ORAL_TABLET | ORAL | 0 refills | Status: DC

## 2020-04-08 ENCOUNTER — Other Ambulatory Visit: Payer: Self-pay | Admitting: Cardiology

## 2020-04-12 ENCOUNTER — Ambulatory Visit (INDEPENDENT_AMBULATORY_CARE_PROVIDER_SITE_OTHER): Payer: Medicare Other | Admitting: *Deleted

## 2020-04-12 DIAGNOSIS — I5042 Chronic combined systolic (congestive) and diastolic (congestive) heart failure: Secondary | ICD-10-CM | POA: Diagnosis not present

## 2020-04-12 LAB — CUP PACEART REMOTE DEVICE CHECK
Battery Remaining Longevity: 162 mo
Battery Remaining Percentage: 100 %
Brady Statistic RV Percent Paced: 0 %
Date Time Interrogation Session: 20210601033200
HighPow Impedance: 56 Ohm
Implantable Lead Implant Date: 20180831
Implantable Lead Location: 753860
Implantable Lead Model: 293
Implantable Lead Serial Number: 435986
Implantable Pulse Generator Implant Date: 20180831
Lead Channel Impedance Value: 447 Ohm
Lead Channel Setting Pacing Amplitude: 2.5 V
Lead Channel Setting Pacing Pulse Width: 0.4 ms
Lead Channel Setting Sensing Sensitivity: 0.5 mV
Pulse Gen Serial Number: 240202

## 2020-04-13 ENCOUNTER — Ambulatory Visit (INDEPENDENT_AMBULATORY_CARE_PROVIDER_SITE_OTHER): Payer: Medicare Other

## 2020-04-13 ENCOUNTER — Other Ambulatory Visit: Payer: Self-pay

## 2020-04-13 DIAGNOSIS — Z95810 Presence of automatic (implantable) cardiac defibrillator: Secondary | ICD-10-CM

## 2020-04-13 DIAGNOSIS — I5042 Chronic combined systolic (congestive) and diastolic (congestive) heart failure: Secondary | ICD-10-CM | POA: Diagnosis not present

## 2020-04-13 NOTE — Progress Notes (Signed)
Remote ICD transmission.   

## 2020-04-14 ENCOUNTER — Telehealth: Payer: Self-pay | Admitting: Cardiology

## 2020-04-14 ENCOUNTER — Ambulatory Visit: Payer: Self-pay | Admitting: Pharmacist

## 2020-04-14 ENCOUNTER — Other Ambulatory Visit: Payer: Self-pay | Admitting: Cardiology

## 2020-04-14 MED ORDER — AMIODARONE HCL 200 MG PO TABS: 200.0000 mg | ORAL_TABLET | Freq: Every day | ORAL | 2 refills | Status: AC

## 2020-04-14 NOTE — Telephone Encounter (Signed)
Patient seen yesterday 6/2 at [facility] Coumadin clinic. Will close encounter with our coumadin  as patient is to follow up with [facility] practice for coumadin monitoring.

## 2020-04-14 NOTE — Telephone Encounter (Signed)
*  STAT* If patient is at the pharmacy, call can be transferred to refill team.   1. Which medications need to be refilled? (please list name of each medication and dose if known)  amiodarone (PACERONE) 200 MG tablet  2. Which pharmacy/location (including street and city if local pharmacy) is medication to be sent to?   3. Do they need a 30 day or 90 day supply? 90 with refills   Pt is out of medication   Patient and his wife have moved out of [place] and not established care with a new Cardiologist yet. They will need enough medication to last ~6 months or so until they can get in with a new doctor

## 2020-04-14 NOTE — Telephone Encounter (Signed)
Wife of the patient called. The patient will need Dr. Swaziland to send orders to the [facility] so that they can start checking his Coumadin levels there. They have moved out of [place] and can no longer come there to get his coumadin checked.   The local [facility] center can be reached at [phone]

## 2020-04-14 NOTE — Telephone Encounter (Signed)
Will forward to pharm-md. 

## 2020-04-15 ENCOUNTER — Telehealth: Payer: Self-pay

## 2020-04-15 NOTE — Telephone Encounter (Signed)
Remote ICM transmission received.  Attempted call to patient regarding ICM remote transmission and left detailed message per DPR.  Advised to return call for any fluid symptoms or questions. Next ICM remote transmission scheduled 05/17/2020.     

## 2020-04-15 NOTE — Progress Notes (Signed)
EPIC Encounter for ICM Monitoring  Patient Name: Jack Joseph is a 74 y.o. male Date: 04/15/2020 Primary Care Physican: [place], Betty G, MD Primary Cardiologist:Jordan Electrophysiologist:Taylor 4/15/2021Office Weight:145 lbs  Attempted call to patient and unable to reach.  Left detailed message per DPR regarding transmission. Transmission reviewed.  Patient has moved to [facility] and will be establishing care in that area.  5/31/2021HeartLogic Heart Failure Index13which suggests within normal threshold (index has been increasing from 0 since 03/09/2020).  Recommendations:Left voice mail with ICM number and encouraged to call if experiencing any fluid symptoms.  Follow-up plan: ICM clinic phone appointment on7/04/2020. 91 day device clinic remote transmission8/31/2021.   Copy of ICM check sent to Dr. Ladona Ridgel.  3 month ICM trend: 04/11/2020    1 Year ICM trend:       Karie Soda, RN 04/15/2020 10:12 AM

## 2020-05-02 ENCOUNTER — Telehealth: Payer: Self-pay | Admitting: Cardiology

## 2020-05-02 NOTE — Telephone Encounter (Signed)
Patient is calling in regards to forms from [facility] for patient to have cardiac clearance for eye surgery on 05/11/20. Made patient aware that [facility] will need to contact our office for pre-op clearance.

## 2020-05-02 NOTE — Telephone Encounter (Signed)
I left a message for the pt that we have not yet received clearance form from [facility]. Left message if he may want to call the eye doctor and request that a clearance form be faxed over to our office. I leave on his vm our fax # [phone]. Our office will keep an eye for the clearance form.

## 2020-05-03 NOTE — Telephone Encounter (Signed)
   Primary Cardiologist: Peter [place], MD  Chart reviewed as part of pre-operative protocol coverage. Cataract extractions are recognized in guidelines as low risk surgeries that do not typically require specific preoperative testing or holding of blood thinner therapy. Therefore, given past medical history and time since last visit, based on ACC/AHA guidelines, Jack Joseph would be at acceptable risk for the planned procedure without further cardiovascular testing.   I will route this recommendation to the requesting party via Epic fax function and remove from pre-op pool.  Please call with questions.  [place], [place] 05/03/2020, 1:57 PM

## 2020-05-03 NOTE — Telephone Encounter (Signed)
Clearance has been faxed.  

## 2020-05-03 NOTE — Telephone Encounter (Signed)
   [facility] Pre-operative Risk Assessment    HEARTCARE STAFF: - Please ensure there is not already an duplicate clearance open for this procedure. - Under Visit Info/Reason for Call, type in Other and utilize the format Clearance MM/DD/YY or Clearance TBD. Do not use dashes or single digits. - If request is for dental extraction, please clarify the # of teeth to be extracted.  Request for surgical clearance:  1. What type of surgery is being performed? CATARACT SURGERY   2. When is this surgery scheduled? 05/11/20   3. What type of clearance is required (medical clearance vs. Pharmacy clearance to hold med vs. Both)? BOTH  4. Are there any medications that need to be held prior to surgery and how long? WARFARIN   5. Practice name and name of physician performing surgery? [facility]; DR. ALEX MCGAUGHY   6. What is the office phone number? [phone]   7.   What is the office fax number? [phone]  8.   Anesthesia type (None, local, MAC, general) ? IV SEDATION   Julaine Hua 05/03/2020, 8:25 AM  _________________________________________________________________   (provider comments below)

## 2020-05-03 NOTE — Telephone Encounter (Signed)
No need to hold warfarin for cataract surgery

## 2020-05-17 ENCOUNTER — Ambulatory Visit (INDEPENDENT_AMBULATORY_CARE_PROVIDER_SITE_OTHER): Payer: Medicare Other

## 2020-05-17 DIAGNOSIS — Z95810 Presence of automatic (implantable) cardiac defibrillator: Secondary | ICD-10-CM

## 2020-05-17 DIAGNOSIS — I5042 Chronic combined systolic (congestive) and diastolic (congestive) heart failure: Secondary | ICD-10-CM

## 2020-05-17 NOTE — Progress Notes (Signed)
EPIC Encounter for ICM Monitoring  Patient Name: Jack Joseph is a 74 y.o. male Date: 05/17/2020 Primary Care Physican: [place], Betty G, MD Primary Cardiologist:Jordan Electrophysiologist:Taylor 7/6/2021Weight:140 lbs  Spoke with patient.  He denies any fluid symptoms.  Patient eats at restaurants frequently, frozen dinners at home as well as ham sandwiches.  Patient has moved to [facility] and has an appointment with cardiologist in a couple of weeks.  7/5/2021HeartLogic Heart Failure Indexincreased to 28 suggesting possible fluid accumulation since 04/22/2020.    Prescribed: Spironolactone 25 mg take 0.5 tablet daily.   Recommendations: Advised to avoid restaurant foods, frozen dinners and deli meats that are high in salt.  Encouraged to eat more fruits, fresh or frozen vegetables and lean meats to help decrease sodium intake.   Follow-up plan: ICM clinic phone appointment on7/13/2021 to recheck fluid levels.91 day device clinic remote transmission8/31/2021.   Copy of ICM check sent to Dr. Ladona Ridgel.  3 Month Trend    8 Day Data Trend         Karie Soda, RN 05/17/2020 3:36 PM

## 2020-05-24 ENCOUNTER — Ambulatory Visit (INDEPENDENT_AMBULATORY_CARE_PROVIDER_SITE_OTHER): Payer: Medicare Other

## 2020-05-24 DIAGNOSIS — I5042 Chronic combined systolic (congestive) and diastolic (congestive) heart failure: Secondary | ICD-10-CM

## 2020-05-24 DIAGNOSIS — Z95810 Presence of automatic (implantable) cardiac defibrillator: Secondary | ICD-10-CM

## 2020-05-25 NOTE — Progress Notes (Signed)
EPIC Encounter for ICM Monitoring  Patient Name: Jack Joseph is a 74 y.o. male Date: 05/25/2020 Primary Care Physican: [place], Betty G, MD Primary Cardiologist:Jordan Electrophysiologist:Taylor 7/6/2021Weight:140 lbs  Attempted call to patient and unable to reach.  Left detailed message per DPR regarding transmission. Transmission reviewed.   7/12/2021HeartLogic Heart Failure Indexshow slight improvement and decreased from 28 to 26 but continues to suggest possible fluid accumulation since 04/22/2020.    Prescribed: Spironolactone 25 mg take 0.5 tablet daily.   Recommendations: Left voice mail with ICM number and encouraged to call if experiencing any fluid symptoms.   Follow-up plan: ICM clinic phone appointment on7/30/2021 to recheck fluid levels.91 day device clinic remote transmission8/31/2021.   Copy of ICM check sent to Dr. Ladona Ridgel and Dr [place].  3 month ICM trend: 05/24/2020    1 Year ICM trend:       Karie Soda, RN 05/25/2020 5:01 PM

## 2020-05-26 ENCOUNTER — Other Ambulatory Visit: Payer: Self-pay | Admitting: Cardiology

## 2020-05-26 ENCOUNTER — Ambulatory Visit: Payer: Self-pay | Admitting: Pharmacist

## 2020-05-26 NOTE — Progress Notes (Signed)
Jack Mate RN from [facility] urgent [facility] called.  Patient INR yesterday was 5.6.  Provider at [facility] advised patient to hold warfarin for 3 days.  We have not performed an INR on this patient since 4/15 since he has moved out of town.  Jack Joseph said that [facility] offered to patient to manage his warfarin/INR but he would have to be an established patient.  Patient declined and reported he wanted to be managed by a cardiologist and is waiting on records from [place].  Jack Joseph to have patient contact new cardiology practice and request records.

## 2020-05-31 ENCOUNTER — Ambulatory Visit (INDEPENDENT_AMBULATORY_CARE_PROVIDER_SITE_OTHER): Payer: Medicare Other | Admitting: Pharmacist

## 2020-05-31 DIAGNOSIS — Z952 Presence of prosthetic heart valve: Secondary | ICD-10-CM

## 2020-05-31 DIAGNOSIS — Z95828 Presence of other vascular implants and grafts: Secondary | ICD-10-CM

## 2020-05-31 DIAGNOSIS — Z5181 Encounter for therapeutic drug level monitoring: Secondary | ICD-10-CM

## 2020-05-31 DIAGNOSIS — Z7901 Long term (current) use of anticoagulants: Secondary | ICD-10-CM | POA: Diagnosis not present

## 2020-05-31 DIAGNOSIS — I359 Nonrheumatic aortic valve disorder, unspecified: Secondary | ICD-10-CM

## 2020-05-31 DIAGNOSIS — E039 Hypothyroidism, unspecified: Secondary | ICD-10-CM

## 2020-05-31 LAB — POCT INR: INR: 1.7 — AB (ref 2.0–3.0)

## 2020-05-31 NOTE — Patient Instructions (Addendum)
Take 2 tablets (3 mg) today and take 2 tablets (5 mg tomorrow) then continue current regimen.  2.5 mg on Mon/Wed/Fri/Sat and 1.5 mg (Tues/Thurs/Sun).  Instructions given to Jack Joseph at [place].

## 2020-06-02 ENCOUNTER — Other Ambulatory Visit: Payer: Self-pay | Admitting: Cardiothoracic Surgery

## 2020-06-02 DIAGNOSIS — R911 Solitary pulmonary nodule: Secondary | ICD-10-CM

## 2020-06-10 ENCOUNTER — Ambulatory Visit (INDEPENDENT_AMBULATORY_CARE_PROVIDER_SITE_OTHER): Payer: Medicare Other

## 2020-06-10 DIAGNOSIS — I5042 Chronic combined systolic (congestive) and diastolic (congestive) heart failure: Secondary | ICD-10-CM

## 2020-06-10 DIAGNOSIS — Z95810 Presence of automatic (implantable) cardiac defibrillator: Secondary | ICD-10-CM

## 2020-06-10 NOTE — Progress Notes (Signed)
EPIC Encounter for ICM Monitoring  Patient Name: Jack Joseph is a 74 y.o. male Date: 06/10/2020 Primary Care Physican: [place], Betty G, MD Primary Cardiologist:Jordan Electrophysiologist:Taylor 7/6/2021Weight:140lbs  Transmission reviewed.   7/26/2021HeartLogic Heart Failure Index10 suggesting fluid levels have returned to normal.  Prescribed: Spironolactone 25 mg take 0.5 tablet daily.  Recommendations: None  Follow-up plan: ICM clinic phone appointment on8/16/2021.91 day device clinic remote transmission8/31/2021.   Copy of ICM check sent to Dr. Ladona Ridgel and Dr [place].  3 Month Trend    8 Day Data Trend          Karie Soda, RN 06/10/2020 1:39 PM

## 2020-06-12 ENCOUNTER — Other Ambulatory Visit: Payer: Self-pay | Admitting: Cardiology

## 2020-06-19 ENCOUNTER — Other Ambulatory Visit: Payer: Self-pay | Admitting: Cardiology

## 2020-06-27 ENCOUNTER — Ambulatory Visit (INDEPENDENT_AMBULATORY_CARE_PROVIDER_SITE_OTHER): Payer: Medicare Other

## 2020-06-27 DIAGNOSIS — Z95810 Presence of automatic (implantable) cardiac defibrillator: Secondary | ICD-10-CM | POA: Diagnosis not present

## 2020-06-27 DIAGNOSIS — I5042 Chronic combined systolic (congestive) and diastolic (congestive) heart failure: Secondary | ICD-10-CM | POA: Diagnosis not present

## 2020-06-29 NOTE — Progress Notes (Signed)
EPIC Encounter for ICM Monitoring  Patient Name: Jack Joseph is a 74 y.o. male Date: 06/29/2020 Primary Care Physican: [place], Betty G, MD Primary Cardiologist:Jordan Electrophysiologist:Taylor 7/6/2021Weight:140lbs  Transmission reviewed.  8/16/2021HeartLogic Heart Failure Index0 suggesting normal fluid levels.  Prescribed: Spironolactone 25 mg take 0.5 tablet daily.  Recommendations: None  Follow-up plan: ICM clinic phone appointment on9/21/2021.91 day device clinic remote transmission8/31/2021.   Copy of ICM check sent to Dr. Oliver Barre Dr [place]  3 Month Trend    Karie Soda, RN 06/29/2020 4:57 PM

## 2020-07-07 ENCOUNTER — Ambulatory Visit: Payer: Medicare Other | Admitting: Cardiothoracic Surgery

## 2020-07-07 ENCOUNTER — Other Ambulatory Visit: Payer: Medicare Other

## 2020-07-12 ENCOUNTER — Other Ambulatory Visit: Payer: Self-pay | Admitting: Cardiology

## 2020-07-14 ENCOUNTER — Other Ambulatory Visit: Payer: Self-pay | Admitting: Cardiology

## 2020-08-02 ENCOUNTER — Ambulatory Visit (INDEPENDENT_AMBULATORY_CARE_PROVIDER_SITE_OTHER): Payer: Medicare Other

## 2020-08-02 DIAGNOSIS — Z95810 Presence of automatic (implantable) cardiac defibrillator: Secondary | ICD-10-CM | POA: Diagnosis not present

## 2020-08-02 DIAGNOSIS — I5042 Chronic combined systolic (congestive) and diastolic (congestive) heart failure: Secondary | ICD-10-CM

## 2020-08-05 NOTE — Progress Notes (Signed)
EPIC Encounter for ICM Monitoring  Patient Name: Jack Joseph is a 75 y.o. male Date: 08/05/2020 Primary Care Physican: [place], Betty G, MD Primary Cardiologist:Jordan Electrophysiologist:Taylor 7/6/2021Weight:140lbs  Transmission reviewed.  9/21/2021HeartLogic Heart Failure Index0 suggesting normal fluid levels.  Prescribed: Spironolactone 25 mg take 0.5 tablet daily.  Recommendations: None  Follow-up plan: ICM clinic phone appointment on10/25/2021.91 day device clinic remote transmission11/30/2021.   Copy of ICM check sent to Dr. Ladona Ridgel.  3 Month Trend    8 Day Data Trend          Karie Soda, RN 08/05/2020 2:22 PM

## 2020-08-06 ENCOUNTER — Other Ambulatory Visit: Payer: Self-pay | Admitting: Cardiology

## 2020-09-05 ENCOUNTER — Ambulatory Visit (INDEPENDENT_AMBULATORY_CARE_PROVIDER_SITE_OTHER): Payer: Medicare Other

## 2020-09-05 DIAGNOSIS — Z95810 Presence of automatic (implantable) cardiac defibrillator: Secondary | ICD-10-CM

## 2020-09-05 DIAGNOSIS — I5042 Chronic combined systolic (congestive) and diastolic (congestive) heart failure: Secondary | ICD-10-CM

## 2020-09-12 NOTE — Progress Notes (Signed)
EPIC Encounter for ICM Monitoring  Patient Name: Jack Joseph is a 74 y.o. male Date: 09/12/2020 Primary Care Physican: [place], Betty G, MD Primary Cardiologist:Jordan Electrophysiologist:Taylor Last Weight:140lbs  Transmission reviewed.  HeartLogic Heart Failure Index0suggesting normalfluid levels.  Prescribed: Spironolactone 25 mg take 0.5 tablet daily.  Recommendations: None  Follow-up plan: ICM clinic phone appointment on12/11/2019.91 day device clinic remote transmission11/30/2021.   Copy of ICM check sent to Dr. Ladona Ridgel.  3 Month Trend    8 Day Data Trend          Karie Soda, RN 09/12/2020 11:21 AM

## 2020-09-15 ENCOUNTER — Telehealth: Payer: Self-pay

## 2020-09-15 NOTE — Telephone Encounter (Signed)
Pt called stating he is being followed by EP in [place] now.
